# Patient Record
Sex: Female | Born: 1950 | Race: Black or African American | Hispanic: No | Smoking: Current every day smoker
Health system: Southern US, Community
[De-identification: ages and names within clinical notes are randomized; demographics above are authoritative.]

## PROBLEM LIST (undated history)

## (undated) DIAGNOSIS — F329 Major depressive disorder, single episode, unspecified: Secondary | ICD-10-CM

## (undated) DIAGNOSIS — E785 Hyperlipidemia, unspecified: Secondary | ICD-10-CM

## (undated) DIAGNOSIS — T7840XA Allergy, unspecified, initial encounter: Secondary | ICD-10-CM

## (undated) DIAGNOSIS — C50919 Malignant neoplasm of unspecified site of unspecified female breast: Secondary | ICD-10-CM

## (undated) DIAGNOSIS — F32A Depression, unspecified: Secondary | ICD-10-CM

## (undated) DIAGNOSIS — H269 Unspecified cataract: Secondary | ICD-10-CM

## (undated) DIAGNOSIS — F419 Anxiety disorder, unspecified: Secondary | ICD-10-CM

## (undated) DIAGNOSIS — I10 Essential (primary) hypertension: Secondary | ICD-10-CM

## (undated) HISTORY — DX: Unspecified cataract: H26.9

## (undated) HISTORY — DX: Major depressive disorder, single episode, unspecified: F32.9

## (undated) HISTORY — PX: OTHER SURGICAL HISTORY: SHX169

## (undated) HISTORY — DX: Anxiety disorder, unspecified: F41.9

## (undated) HISTORY — DX: Hyperlipidemia, unspecified: E78.5

## (undated) HISTORY — DX: Depression, unspecified: F32.A

## (undated) HISTORY — DX: Essential (primary) hypertension: I10

## (undated) HISTORY — DX: Allergy, unspecified, initial encounter: T78.40XA

## (undated) HISTORY — PX: DENTAL SURGERY: SHX609

## (undated) HISTORY — DX: Malignant neoplasm of unspecified site of unspecified female breast: C50.919

---

## 1974-01-15 HISTORY — PX: TUBAL LIGATION: SHX77

## 2002-06-28 ENCOUNTER — Emergency Department (HOSPITAL_COMMUNITY): Admission: EM | Admit: 2002-06-28 | Discharge: 2002-06-28 | Payer: Self-pay

## 2002-10-05 ENCOUNTER — Emergency Department (HOSPITAL_COMMUNITY): Admission: EM | Admit: 2002-10-05 | Discharge: 2002-10-06 | Payer: Self-pay | Admitting: Emergency Medicine

## 2002-10-05 ENCOUNTER — Encounter: Payer: Self-pay | Admitting: Emergency Medicine

## 2003-09-29 ENCOUNTER — Emergency Department (HOSPITAL_COMMUNITY): Admission: EM | Admit: 2003-09-29 | Discharge: 2003-09-29 | Payer: Self-pay | Admitting: Family Medicine

## 2016-10-18 ENCOUNTER — Encounter: Payer: Self-pay | Admitting: Internal Medicine

## 2016-12-12 ENCOUNTER — Ambulatory Visit (AMBULATORY_SURGERY_CENTER): Payer: Self-pay | Admitting: *Deleted

## 2016-12-12 ENCOUNTER — Other Ambulatory Visit: Payer: Self-pay

## 2016-12-12 VITALS — Ht 69.0 in | Wt 189.0 lb

## 2016-12-12 DIAGNOSIS — Z800 Family history of malignant neoplasm of digestive organs: Secondary | ICD-10-CM

## 2016-12-12 MED ORDER — NA SULFATE-K SULFATE-MG SULF 17.5-3.13-1.6 GM/177ML PO SOLN: 1.0000 | Freq: Once | ORAL | 0 refills | Status: AC

## 2016-12-12 NOTE — Progress Notes (Signed)
No egg or soy allergy known to patient  No issues with past sedation with any surgeries  or procedures, no intubation problems  No diet pills per patient No home 02 use per patient  No blood thinners per patient  Pt denies issues with constipation  No A fib or A flutter  EMMI video sent to pt's e mail pt declined   

## 2016-12-24 ENCOUNTER — Encounter: Payer: Self-pay | Admitting: Internal Medicine

## 2016-12-31 ENCOUNTER — Encounter: Payer: Self-pay | Admitting: Internal Medicine

## 2016-12-31 ENCOUNTER — Other Ambulatory Visit: Payer: Self-pay

## 2016-12-31 ENCOUNTER — Ambulatory Visit (AMBULATORY_SURGERY_CENTER): Payer: Medicare Other | Admitting: Internal Medicine

## 2016-12-31 VITALS — BP 145/70 | HR 66 | Temp 98.2°F | Resp 12 | Ht 69.0 in | Wt 189.0 lb

## 2016-12-31 DIAGNOSIS — Z1211 Encounter for screening for malignant neoplasm of colon: Secondary | ICD-10-CM | POA: Diagnosis not present

## 2016-12-31 DIAGNOSIS — D128 Benign neoplasm of rectum: Secondary | ICD-10-CM

## 2016-12-31 DIAGNOSIS — D122 Benign neoplasm of ascending colon: Secondary | ICD-10-CM

## 2016-12-31 DIAGNOSIS — D124 Benign neoplasm of descending colon: Secondary | ICD-10-CM

## 2016-12-31 DIAGNOSIS — Z800 Family history of malignant neoplasm of digestive organs: Secondary | ICD-10-CM

## 2016-12-31 DIAGNOSIS — D129 Benign neoplasm of anus and anal canal: Secondary | ICD-10-CM

## 2016-12-31 DIAGNOSIS — D127 Benign neoplasm of rectosigmoid junction: Secondary | ICD-10-CM

## 2016-12-31 MED ORDER — SODIUM CHLORIDE 0.9 % IV SOLN: 500.0000 mL | Freq: Once | INTRAVENOUS | Status: DC

## 2016-12-31 NOTE — Progress Notes (Signed)
Pt's states no medical or surgical changes since previsit or office visit. 

## 2016-12-31 NOTE — Patient Instructions (Signed)
  No Ibuprofen,Na[proxen,or other non steroidal anti inflammatory products for 2 weeks after polyp remval  Await pathology results from Dr Hilarie Fredrickson   Information on polyps and hemorrhoids given to you today   YOU HAD AN ENDOSCOPIC PROCEDURE TODAY AT [facility]:   Refer to the procedure report that was given to you for any specific questions about what was found during the examination.  If the procedure report does not answer your questions, please call your gastroenterologist to clarify.  If you requested that your care partner not be given the details of your procedure findings, then the procedure report has been included in a sealed envelope for you to review at your convenience later.  YOU SHOULD EXPECT: Some feelings of bloating in the abdomen. Passage of more gas than usual.  Walking can help get rid of the air that was put into your GI tract during the procedure and reduce the bloating. If you had a lower endoscopy (such as a colonoscopy or flexible sigmoidoscopy) you may notice spotting of blood in your stool or on the toilet paper. If you underwent a bowel prep for your procedure, you may not have a normal bowel movement for a few days.  Please Note:  You might notice some irritation and congestion in your nose or some drainage.  This is from the oxygen used during your procedure.  There is no need for concern and it should clear up in a day or so.  SYMPTOMS TO REPORT IMMEDIATELY:   Following lower endoscopy (colonoscopy or flexible sigmoidoscopy):  Excessive amounts of blood in the stool  Significant tenderness or worsening of abdominal pains  Swelling of the abdomen that is new, acute  Fever of 100F or higher    For urgent or emergent issues, a gastroenterologist can be reached at any hour by calling [phone].   DIET:  We do recommend a small meal at first, but then you may proceed to your regular diet.  Drink plenty of fluids but you should avoid  alcoholic beverages for 24 hours.  ACTIVITY:  You should plan to take it easy for the rest of today and you should NOT DRIVE or use heavy machinery until tomorrow (because of the sedation medicines used during the test).    FOLLOW UP: Our staff will call the number listed on your records the next business day following your procedure to check on you and address any questions or concerns that you may have regarding the information given to you following your procedure. If we do not reach you, we will leave a message.  However, if you are feeling well and you are not experiencing any problems, there is no need to return our call.  We will assume that you have returned to your regular daily activities without incident.  If any biopsies were taken you will be contacted by phone or by letter within the next 1-3 weeks.  Please call us at [phone] if you have not heard about the biopsies in 3 weeks.    SIGNATURES/CONFIDENTIALITY: You and/or your care partner have signed paperwork which will be entered into your electronic medical record.  These signatures attest to the fact that that the information above on your After Visit Summary has been reviewed and is understood.  Full responsibility of the confidentiality of this discharge information lies with you and/or your care-partner.

## 2016-12-31 NOTE — Progress Notes (Signed)
Called to room to assist during endoscopic procedure.  Patient ID and intended procedure confirmed with present staff. Received instructions for my participation in the procedure from the performing physician.  

## 2016-12-31 NOTE — Progress Notes (Signed)
Report to PACU, RN, vss, BBS= Clear.  

## 2016-12-31 NOTE — Op Note (Signed)
[facility] Patient Name: Donna [name] Procedure Date: 12/31/2016 8:42 AM MRN: [id number] Endoscopist: Jerene Bears , MD Age: 66 Referring MD:  Date of Birth: 06-04-1950 Gender: Female Account #: [id number] Procedure:                Colonoscopy Indications:              Screening in patient at increased risk: Family                            history of 1st-degree relative with colorectal                            cancer, This is the patient's first colonoscopy Medicines:                Monitored Anesthesia Care Procedure:                Pre-Anesthesia Assessment:                           - Prior to the procedure, a History and Physical                            was performed, and patient medications and                            allergies were reviewed. The patient's tolerance of                            previous anesthesia was also reviewed. The risks                            and benefits of the procedure and the sedation                            options and risks were discussed with the patient.                            All questions were answered, and informed consent                            was obtained. Prior Anticoagulants: The patient has                            taken no previous anticoagulant or antiplatelet                            agents. ASA Grade Assessment: II - A patient with                            mild systemic disease. After reviewing the risks                            and benefits, the patient was deemed in  satisfactory condition to undergo the procedure.                           After obtaining informed consent, the colonoscope                            was passed under direct vision. Throughout the                            procedure, the patient's blood pressure, pulse, and                            oxygen saturations were monitored continuously. The                            Model PCF-H190DL  [id number]) scope was introduced                            through the anus and advanced to the the cecum,                            identified by appendiceal orifice and ileocecal                            valve. The colonoscopy was performed without                            difficulty. The patient tolerated the procedure                            well. The quality of the bowel preparation was                            good. The ileocecal valve, appendiceal orifice, and                            rectum were photographed. Scope In: 8:55:39 AM Scope Out: 9:21:31 AM Scope Withdrawal Time: 0 hours 19 minutes 18 seconds  Total Procedure Duration: 0 hours 25 minutes 52 seconds  Findings:                 The digital rectal exam was normal.                           A 6 mm polyp was found in the ascending colon. The                            polyp was sessile. The polyp was removed with a                            cold snare. Resection and retrieval were complete.                           A 10 mm polyp was found in the proximal  descending                            colon. The polyp was flat. The polyp was removed                            with a hot snare. Resection and retrieval were                            complete.                           Two sessile polyps were found in the descending                            colon. The polyps were 3 to 4 mm in size. These                            polyps were removed with a cold snare. Resection                            and retrieval were complete.                           A 6 mm polyp was found in the recto-sigmoid colon.                            The polyp was sessile. The polyp was removed with a                            cold snare. Resection and retrieval were complete.                           A 4 mm polyp was found in the rectum. The polyp was                            sessile. The polyp was removed with a cold snare.                             Resection and retrieval were complete.                           Internal hemorrhoids were found during                            retroflexion. The hemorrhoids were small. Complications:            No immediate complications. Estimated Blood Loss:     Estimated blood loss was minimal. Impression:               - One 6 mm polyp in the ascending colon, removed                            with a cold snare. Resected and  retrieved.                           - One 10 mm polyp in the proximal descending colon,                            removed with a hot snare. Resected and retrieved.                           - Two 3 to 4 mm polyps in the descending colon,                            removed with a cold snare. Resected and retrieved.                           - One 6 mm polyp at the recto-sigmoid colon,                            removed with a cold snare. Resected and retrieved.                           - One 4 mm polyp in the rectum, removed with a cold                            snare. Resected and retrieved.                           - Internal hemorrhoids. Recommendation:           - Patient has a contact number available for                            emergencies. The signs and symptoms of potential                            delayed complications were discussed with the                            patient. Return to normal activities tomorrow.                            Written discharge instructions were provided to the                            patient.                           - Resume previous diet.                           - Continue present medications.                           - Await pathology results.                           -  Repeat colonoscopy is recommended for                            surveillance. The colonoscopy date will be                            determined after pathology results from today's                            exam become  available for review.                           - No ibuprofen, naproxen, or other non-steroidal                            anti-inflammatory drugs for 2 weeks after polyp                            removal. Jerene Bears, MD 12/31/2016 9:27:49 AM This report has been signed electronically.

## 2017-01-01 ENCOUNTER — Telehealth: Payer: Self-pay

## 2017-01-01 NOTE — Telephone Encounter (Signed)
Called [phone] and left a messaged we tried to reach pt for a follow up call. maw

## 2017-01-01 NOTE — Telephone Encounter (Signed)
  Follow up Call-  Call back number 12/31/2016  Post procedure Call Back phone  # [phone]  Permission to leave phone message Yes  Some recent data might be hidden     Patient questions:  Do you have a fever, pain , or abdominal swelling? No. Pain Score  0 *  Have you tolerated food without any problems? Yes.    Have you been able to return to your normal activities? Yes.    Do you have any questions about your discharge instructions: Diet   No. Medications  No. Follow up visit  No.  Do you have questions or concerns about your Care? No.  Actions: * If pain score is 4 or above: No action needed, pain <4.  No problems noted per pt. maw

## 2017-01-04 ENCOUNTER — Encounter: Payer: Self-pay | Admitting: Internal Medicine

## 2019-02-11 ENCOUNTER — Ambulatory Visit: Payer: Medicare Other | Attending: Internal Medicine

## 2019-02-11 DIAGNOSIS — Z20822 Contact with and (suspected) exposure to covid-19: Secondary | ICD-10-CM

## 2019-02-12 LAB — NOVEL CORONAVIRUS, NAA: SARS-CoV-2, NAA: NOT DETECTED

## 2019-02-13 ENCOUNTER — Telehealth: Payer: Self-pay

## 2019-02-13 NOTE — Telephone Encounter (Signed)
Patient is calling to receive her COVID test results. Patient expressed understanding. And will call back with a fax number of her employer for her results to be faxed.

## 2020-03-29 ENCOUNTER — Encounter: Payer: Self-pay | Admitting: Internal Medicine

## 2020-05-15 DIAGNOSIS — I639 Cerebral infarction, unspecified: Secondary | ICD-10-CM

## 2020-05-15 HISTORY — DX: Cerebral infarction, unspecified: I63.9

## 2020-05-25 ENCOUNTER — Other Ambulatory Visit: Payer: Self-pay

## 2020-05-25 ENCOUNTER — Emergency Department (HOSPITAL_COMMUNITY): Payer: Medicare Other

## 2020-05-25 ENCOUNTER — Inpatient Hospital Stay (HOSPITAL_COMMUNITY)
Admission: EM | Admit: 2020-05-25 | Discharge: 2020-05-31 | DRG: 065 | Disposition: A | Discharge status: Skilled Nursing Facility | Payer: Medicare Other | Attending: Family Medicine | Admitting: Family Medicine

## 2020-05-25 ENCOUNTER — Encounter (HOSPITAL_COMMUNITY): Payer: Self-pay

## 2020-05-25 DIAGNOSIS — F17210 Nicotine dependence, cigarettes, uncomplicated: Secondary | ICD-10-CM | POA: Diagnosis present

## 2020-05-25 DIAGNOSIS — I6340 Cerebral infarction due to embolism of unspecified cerebral artery: Secondary | ICD-10-CM | POA: Diagnosis not present

## 2020-05-25 DIAGNOSIS — E785 Hyperlipidemia, unspecified: Secondary | ICD-10-CM | POA: Diagnosis present

## 2020-05-25 DIAGNOSIS — Z20822 Contact with and (suspected) exposure to covid-19: Secondary | ICD-10-CM | POA: Diagnosis present

## 2020-05-25 DIAGNOSIS — R471 Dysarthria and anarthria: Secondary | ICD-10-CM | POA: Diagnosis present

## 2020-05-25 DIAGNOSIS — Z66 Do not resuscitate: Secondary | ICD-10-CM | POA: Diagnosis present

## 2020-05-25 DIAGNOSIS — Z634 Disappearance and death of family member: Secondary | ICD-10-CM

## 2020-05-25 DIAGNOSIS — I639 Cerebral infarction, unspecified: Secondary | ICD-10-CM | POA: Diagnosis present

## 2020-05-25 DIAGNOSIS — R29706 NIHSS score 6: Secondary | ICD-10-CM | POA: Diagnosis present

## 2020-05-25 DIAGNOSIS — K5900 Constipation, unspecified: Secondary | ICD-10-CM | POA: Diagnosis not present

## 2020-05-25 DIAGNOSIS — I6300 Cerebral infarction due to thrombosis of unspecified precerebral artery: Secondary | ICD-10-CM | POA: Diagnosis not present

## 2020-05-25 DIAGNOSIS — Z9114 Patient's other noncompliance with medication regimen: Secondary | ICD-10-CM | POA: Diagnosis not present

## 2020-05-25 DIAGNOSIS — I69354 Hemiplegia and hemiparesis following cerebral infarction affecting left non-dominant side: Secondary | ICD-10-CM | POA: Diagnosis not present

## 2020-05-25 DIAGNOSIS — F419 Anxiety disorder, unspecified: Secondary | ICD-10-CM | POA: Diagnosis present

## 2020-05-25 DIAGNOSIS — I6389 Other cerebral infarction: Secondary | ICD-10-CM | POA: Diagnosis not present

## 2020-05-25 DIAGNOSIS — F32A Depression, unspecified: Secondary | ICD-10-CM | POA: Diagnosis present

## 2020-05-25 DIAGNOSIS — Z9851 Tubal ligation status: Secondary | ICD-10-CM | POA: Diagnosis not present

## 2020-05-25 DIAGNOSIS — Z79899 Other long term (current) drug therapy: Secondary | ICD-10-CM | POA: Diagnosis not present

## 2020-05-25 DIAGNOSIS — R29810 Facial weakness: Secondary | ICD-10-CM | POA: Diagnosis present

## 2020-05-25 DIAGNOSIS — R059 Cough, unspecified: Secondary | ICD-10-CM | POA: Diagnosis not present

## 2020-05-25 DIAGNOSIS — I10 Essential (primary) hypertension: Secondary | ICD-10-CM

## 2020-05-25 DIAGNOSIS — F5102 Adjustment insomnia: Secondary | ICD-10-CM

## 2020-05-25 LAB — PROTIME-INR
INR: 0.9 (ref 0.8–1.2)
Prothrombin Time: 12.2 seconds (ref 11.4–15.2)

## 2020-05-25 LAB — I-STAT CHEM 8, ED
BUN: 22 mg/dL (ref 8–23)
Calcium, Ion: 1.17 mmol/L (ref 1.15–1.40)
Chloride: 110 mmol/L (ref 98–111)
Creatinine, Ser: 0.9 mg/dL (ref 0.44–1.00)
Glucose, Bld: 88 mg/dL (ref 70–99)
HCT: 44 % (ref 36.0–46.0)
Hemoglobin: 15 g/dL (ref 12.0–15.0)
Potassium: 4.6 mmol/L (ref 3.5–5.1)
Sodium: 140 mmol/L (ref 135–145)
TCO2: 22 mmol/L (ref 22–32)

## 2020-05-25 LAB — CBC
HCT: 46.2 % — ABNORMAL HIGH (ref 36.0–46.0)
Hemoglobin: 15.3 g/dL — ABNORMAL HIGH (ref 12.0–15.0)
MCH: 33 pg (ref 26.0–34.0)
MCHC: 33.1 g/dL (ref 30.0–36.0)
MCV: 99.6 fL (ref 80.0–100.0)
Platelets: 261 10*3/uL (ref 150–400)
RBC: 4.64 MIL/uL (ref 3.87–5.11)
RDW: 13.1 % (ref 11.5–15.5)
WBC: 11.4 10*3/uL — ABNORMAL HIGH (ref 4.0–10.5)
nRBC: 0 % (ref 0.0–0.2)

## 2020-05-25 LAB — COMPREHENSIVE METABOLIC PANEL
ALT: 17 U/L (ref 0–44)
AST: 22 U/L (ref 15–41)
Albumin: 3.3 g/dL — ABNORMAL LOW (ref 3.5–5.0)
Alkaline Phosphatase: 63 U/L (ref 38–126)
Anion gap: 4 — ABNORMAL LOW (ref 5–15)
BUN: 20 mg/dL (ref 8–23)
CO2: 24 mmol/L (ref 22–32)
Calcium: 9.1 mg/dL (ref 8.9–10.3)
Chloride: 109 mmol/L (ref 98–111)
Creatinine, Ser: 1 mg/dL (ref 0.44–1.00)
GFR, Estimated: >60 mL/min (ref >60)
Glucose, Bld: 97 mg/dL (ref 70–99)
Potassium: 4.9 mmol/L (ref 3.5–5.1)
Sodium: 137 mmol/L (ref 135–145)
Total Bilirubin: 0.3 mg/dL (ref 0.3–1.2)
Total Protein: 6.2 g/dL — ABNORMAL LOW (ref 6.5–8.1)

## 2020-05-25 LAB — DIFFERENTIAL
Abs Immature Granulocytes: 0.04 10*3/uL (ref 0.00–0.07)
Basophils Absolute: 0.1 10*3/uL (ref 0.0–0.1)
Basophils Relative: 1 %
Eosinophils Absolute: 0.3 10*3/uL (ref 0.0–0.5)
Eosinophils Relative: 3 %
Immature Granulocytes: 0 %
Lymphocytes Relative: 34 %
Lymphs Abs: 3.8 10*3/uL (ref 0.7–4.0)
Monocytes Absolute: 0.5 10*3/uL (ref 0.1–1.0)
Monocytes Relative: 4 %
Neutro Abs: 6.7 10*3/uL (ref 1.7–7.7)
Neutrophils Relative %: 58 %

## 2020-05-25 LAB — RESP PANEL BY RT-PCR (FLU A&B, COVID) ARPGX2
Influenza A by PCR: NEGATIVE
Influenza B by PCR: NEGATIVE
SARS Coronavirus 2 by RT PCR: NEGATIVE

## 2020-05-25 LAB — CBG MONITORING, ED: Glucose-Capillary: 98 mg/dL (ref 70–99)

## 2020-05-25 LAB — APTT: aPTT: 30 seconds (ref 24–36)

## 2020-05-25 MED ORDER — NICOTINE 7 MG/24HR TD PT24
7.0000 mg | MEDICATED_PATCH | Freq: Every day | TRANSDERMAL | Status: DC | PRN
  Administered 2020-05-25 – 2020-05-31 (×3): 7 mg via TRANSDERMAL
  Filled 2020-05-25 (×5): qty 1

## 2020-05-25 MED ORDER — SODIUM CHLORIDE 0.9% FLUSH
3.0000 mL | Freq: Once | INTRAVENOUS | Status: AC
Start: 2020-05-25 — End: 2020-05-25
  Administered 2020-05-25: 3 mL via INTRAVENOUS

## 2020-05-25 MED ORDER — ASPIRIN 325 MG PO TABS: 325.0000 mg | ORAL_TABLET | Freq: Every day | ORAL | Status: DC

## 2020-05-25 MED ORDER — ENOXAPARIN SODIUM 40 MG/0.4ML IJ SOSY
40.0000 mg | PREFILLED_SYRINGE | INTRAMUSCULAR | Status: DC
  Administered 2020-05-25 – 2020-05-31 (×7): 40 mg via SUBCUTANEOUS
  Filled 2020-05-25 (×7): qty 0.4

## 2020-05-25 MED ORDER — TRAZODONE HCL 50 MG PO TABS
50.0000 mg | ORAL_TABLET | Freq: Every day | ORAL | Status: DC
  Administered 2020-05-25 – 2020-05-31 (×7): 50 mg via ORAL
  Filled 2020-05-25 (×7): qty 1

## 2020-05-25 MED ORDER — CLOPIDOGREL BISULFATE 75 MG PO TABS: 75.0000 mg | ORAL_TABLET | Freq: Every day | ORAL | Status: DC

## 2020-05-25 MED ORDER — ASPIRIN 325 MG PO TABS
325.0000 mg | ORAL_TABLET | Freq: Once | ORAL | Status: AC
  Administered 2020-05-25: 325 mg via ORAL
  Filled 2020-05-25: qty 1

## 2020-05-25 NOTE — Progress Notes (Signed)
Pt has been admitted on the unit via bed. Pt has all belongings and daughter at bedside. Telephone and Call light are within reach.  05/25/20 1811  Vitals  Temp 98.3 F (36.8 C)  Temp Source Oral  BP (!) 155/69  MAP (mmHg) 95  BP Location Left Arm  BP Method Automatic  Patient Position (if appropriate) Lying  Pulse Rate 70  Pulse Rate Source Dinamap  Resp 17  Level of Consciousness  Level of Consciousness Alert  MEWS COLOR  MEWS Score Color Green  Oxygen Therapy  SpO2 100 %  O2 Device Room Air  Pain Assessment  Pain Scale 0-10  Pain Score 0  MEWS Score  MEWS Temp 0  MEWS Systolic 0  MEWS Pulse 0  MEWS RR 0  MEWS LOC 0  MEWS Score 0

## 2020-05-25 NOTE — ED Triage Notes (Signed)
Per EMS: PT was fine and went to bed last night at 23:00. Woke up around 02:00 feeling weak on left side and not able to get to the bathroom. Family saw this morning, pt had left side facial droop, left side weakness and slurred speech.  EMS started 18g L hand.

## 2020-05-25 NOTE — Code Documentation (Signed)
Stroke Response Nurse Documentation Code Stroke Documentation Aaminah Forrester  is a 70 y.o.  y.o. female  arriving to [place] [facility] ED via [facility] on 05/25/2020 with PMH of HTN, HLD, Smoker, Anxiety, Depression. Code stroke was activated by EMS. Patient from home where she was LKW at 2300 last night and woke today with L sided weakness, per patient she was able to make it to the bathroom but was then unable to get off the toilet. Patient taking No antithrombotic PTA. Stroke team at the bedside on patient arrival, CBG 52, labs drawn and patient cleared for CT by Dr. Gilford Raid. Patient taken to CT with team. NIHSS 6, see documentation for details and code stroke times. Patient with left facial droop, left leg weakness, left decreased sensation and dysarthria  on exam. The following imaging was completed:  CT. Patient is not a candidate for tPA due to being outside of treatment window. Care/Plan: q2h neuro checks/VS for 12h, followed by q4h. Bedside handoff with ED RN Mosetta Pigeon.    Lenore Manner  Stroke Response RN [phone]

## 2020-05-25 NOTE — Plan of Care (Signed)

## 2020-05-25 NOTE — Consult Note (Signed)
Neurology Consultation  Reason for Consult: Left face droop, left-sided weakness Referring Physician: Dr. Gilford Raid  CC: Left-sided weakness- code stroke  History is obtained from: Patient, EMS  HPI: Donna [name] is a 70 y.o. female with a medical history significant for hypertension, hyperlipidemia, and tobacco use who presented to the ED as a Code Stroke for evaluation of left mouth droop and left-sided weakness. Per patient, she went to bed around 23:30 last night feeling normal and her daughter states that she saw her mother at baseline at this time. When she woke up this morning she states "I couldn't walk, I had to hold on the walls or I would lose balance" and states that her left leg felt heavy. She states that she sat on the toilet and could not get back up. [name] did not immediately call EMS because she thought that the weakness would pass but when it did not, her daughter activated EMS.   At baseline, [name] lives alone and is able to complete all of her ADLs independently. She states that she is supposed to be on medications for her hypertension but she has not been taking any medications for at least 6 months because she has a busy life. She does endorse being under an extreme amount of stress and states that she has many people that are dependent on her, she has to go to work, and that she lost her brother last week.  LKW: 05/24/20 at 23:30 tpa given?: no, outside of time window IR Thrombectomy? No, presentation not consistent with LVO Modified Rankin Scale: 0-Completely asymptomatic and back to baseline post- stroke   ROS: A complete ROS was performed and is negative except as noted in the HPI.   Past Medical History:  Diagnosis Date  . Allergy   . Anxiety   . Depression   . Hyperlipidemia    no meds   . Hypertension    Past Surgical History:  Procedure Laterality Date  . DENTAL SURGERY    . NSVD     x2  . TUBAL LIGATION  1976   Family History   Problem Relation Age of Onset  . Colon cancer Mother        dx'd in her 49's   . Colon polyps Sister   . Esophageal cancer Sister   . Lung cancer Sister   . Breast cancer Sister   . Rectal cancer Neg Hx   . Stomach cancer Neg Hx   . Pancreatic cancer Neg Hx    Social History:   reports that she has been smoking. She has never used smokeless tobacco. She reports that she does not drink alcohol and does not use drugs.  Smoker: started at age 61, smokes at least 0.5 ppd  Medications  Current Facility-Administered Medications:  .  0.9 %  sodium chloride infusion, 500 mL, Intravenous, Once, Pyrtle, Lajuan Lines, MD .  sodium chloride flush (NS) 0.9 % injection 3 mL, 3 mL, Intravenous, Once, Isla Pence, MD  Current Outpatient Medications:  .  amLODipine (NORVASC) 5 MG tablet, Take 5 mg by mouth daily., Disp: , Rfl: 0 .  busPIRone (BUSPAR) 5 MG tablet, Take 5 mg by mouth 2 (two) times daily., Disp: , Rfl: 0 .  cyclobenzaprine (FLEXERIL) 5 MG tablet, Take 5 mg by mouth 3 (three) times daily., Disp: , Rfl: 0 .  escitalopram (LEXAPRO) 10 MG tablet, , Disp: , Rfl: 0 .  hydrochlorothiazide (HYDRODIURIL) 25 MG tablet, , Disp: , Rfl: 0 .  IBU 800 MG tablet, , Disp: , Rfl: 0 .  ipratropium (ATROVENT) 0.03 % nasal spray, instill 2 sprays into each nostril twice a day, Disp: , Rfl: 0 .  lisinopril (PRINIVIL,ZESTRIL) 20 MG tablet, , Disp: , Rfl: 0 .  montelukast (SINGULAIR) 10 MG tablet, , Disp: , Rfl: 0 .  NICODERM CQ 14 MG/24HR patch, , Disp: , Rfl: 0 .  PROAIR HFA 108 (90 Base) MCG/ACT inhaler, , Disp: , Rfl: 0 .  traZODone (DESYREL) 50 MG tablet, , Disp: , Rfl: 0  Exam: Current vital signs: BP (!) 157/74   Pulse 77   Temp 97.9 F (36.6 C) (Oral)   Resp 18   SpO2 98%  Vital signs in last 24 hours:    GENERAL: Awake, alert, pleasant mildly obese African-American lady laying in EMS stretcher Psych: Patient becomes tearful, expresses anxiety. Patient is cooperative with examination.   Head: Normocephalic and atraumatic, dry mm EENT: Arcus senilis present bilaterally, no OP obstruction LUNGS: Normal respiratory effort. Non-labored breathing CV: Regular rate on telemetry, extremities warm without edema ABDOMEN: Soft, non-tender Ext: warm, well perfused, no obvious deformity  NEURO:  Mental Status: Awake, alert, and oriented to person, place, age, month, and situation. She is able to provide a clear and coherent history of present illness.  Speech is mildly dysarthric.  Naming, repetition, and comprehension are intact.  No aphasia or neglect noted on examination.  Cranial Nerves:  II: PERRL 5 mm/brisk. Visual fields full.  III, IV, VI: EOMI without ptosis V: Sensation is intact to light touch and symmetrical to face.  VII: Face is asymmetric resting and smiling with left mouth droop VIII: Hearing is intact to voice IX, X: Palate elevation is symmetric. Phonation normal.  XI: Normal sternocleidomastoid and trapezius muscle strength XII: Tongue protrudes midline without fasciculations.   Motor: 5/5 strength on right upper and lower extremities. Proximal left upper extremity 5/5 without vertical drift with weak grip strength 3/5. Left lower extremity strength is 2/5 without antigravity movement.  Tone and bulk are normal.  Sensation: Decreased sensation to light touch present in left upper and lower extremity.  Coordination: FTN intact bilaterally and HKS intact on the right, unable to assess on the left lower extremity due to weakness. DTRs: 2+ and symmetric patellae and biceps  Gait: Deferred  NIHSS: 1a Level of Conscious.: 0 1b LOC Questions: 0 1c LOC Commands: 0 2 Best Gaze: 0 3 Visual: 0 4 Facial Palsy: 1 5a Motor Arm - left: 0 5b Motor Arm - Right: 0 6a Motor Leg - Left: 3 6b Motor Leg - Right: 0 7 Limb Ataxia: 0 8 Sensory: 1 9 Best Language: 0 10 Dysarthria: 1 11 Extinct. and Inatten.: 0 TOTAL: 6 Premorbid modified Rankin scale 0 Labs I have  reviewed labs in epic and the results pertinent to this consultation are: CBC    Component Value Date/Time   WBC 11.4 (H) 05/25/2020 1340   RBC 4.64 05/25/2020 1340   HGB 15.3 (H) 05/25/2020 1340   HCT 46.2 (H) 05/25/2020 1340   PLT 261 05/25/2020 1340   MCV 99.6 05/25/2020 1340   MCH 33.0 05/25/2020 1340   MCHC 33.1 05/25/2020 1340   RDW 13.1 05/25/2020 1340   LYMPHSABS 3.8 05/25/2020 1340   MONOABS 0.5 05/25/2020 1340   EOSABS 0.3 05/25/2020 1340   BASOSABS 0.1 05/25/2020 1340   CMP     Component Value Date/Time   NA 140 05/25/2020 1349   K 4.6 05/25/2020 1349  CL 110 05/25/2020 1349   CO2 24 05/25/2020 1340   GLUCOSE 88 05/25/2020 1349   BUN 22 05/25/2020 1349   CREATININE 0.90 05/25/2020 1349   CALCIUM 9.1 05/25/2020 1340   PROT 6.2 (L) 05/25/2020 1340   ALBUMIN 3.3 (L) 05/25/2020 1340   AST 22 05/25/2020 1340   ALT 17 05/25/2020 1340   ALKPHOS 63 05/25/2020 1340   BILITOT 0.3 05/25/2020 1340   GFRNONAA >60 05/25/2020 1340    Lipid Panel  No results found for: CHOL, TRIG, HDL, CHOLHDL, VLDL, LDLCALC, LDLDIRECT No results found for: HGBA1C  Imaging I have reviewed the images obtained: CT Head CODE STROKE: There is no acute intracranial hemorrhage or evidence of acute infarction. ASPECT score is 10. 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 MRI examination of the brain pending  Assessment: 70 year old female with multiple stroke risk factors: smoking, age, hypertension- not taking antihypertensive medications > 6 months, and hyperlipidemia who presents to the ED for evaluation of left mouth droop, left lower extremity weakness, and dysarthria. - Examination reveals NIHSS of 6 with dysarthria, left mouth droop, LLE weakness,  distal LUE weakness, and decreased sensation of the left upper and lower extremity.  Exam suggests right brain subcortical infarct likely from small vessel disease - Initial CT head without acute infarction but with  age-indeterminate small vessel infarct of right corona radiata. MRI brain / MRA head and neck pending for further evaluation.  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 Further stroke work up pending  Impression: Concern for acute ischemic right brain subcortical stroke-patient has presented outside tPA window and does not have clinical exam consistent with large vessel occlusion History of hypertension- not on antihypertensive medications Remote and current smoking history  Recommendations: - Admission to medical team for stroke work up - HgbA1c, fasting lipid panel- goal LDL < 70; if LDL > 70 initiate statin therapy  - MRI brain, MRA head and neck without contrast - Frequent neuro checks - Echocardiogram - Prophylactic therapy- Antiplatelet med: Aspirin - dose 325mg  PO or 300mg  PR, consider DAPT following MRI - Permissive hypertension- treat blood pressure > 220 / > 180 - Risk factor modification - Telemetry monitoring - PT consult, OT consult, Speech consult - Stroke team to follow  Anibal Henderson, AGAC-NP [facility] Pager: [phone]  Stroke Attending Note:  I have personally obtained history,examined this patient, reviewed notes, independently viewed imaging studies, participated in medical decision making and plan of care.ROS completed by me personally and pertinent positives fully documented  I have made any additions or clarifications directly to the above note. Agree with note above.  Patient presented with sudden onset of left leg weakness slurred speech and left facial droop likely from right brain subcortical infarct and has presented outside time window for tPA and clinical presentation not consistent with an LVO.  Recommend admission to the medical team for further stroke work-up.  Check MRI, MRAs, echocardiogram, lipid profile hemoglobin A1c.  She will likely need dual antiplatelet therapy  of aspirin and Plavix for 3 weeks followed by aspirin alone and aggressive risk factor modification.  Discussed with patient and with Dr. Gilford Raid.  Greater than 50% time during this 80-minute consultation visit was spent in counseling and coordination of care and discussion with care team and answering questions  Antony Contras, MD Medical Director [place] Pager: [phone] 05/25/2020 3:57 PM

## 2020-05-25 NOTE — ED Notes (Signed)
Patient transported to MRI 

## 2020-05-25 NOTE — H&P (Addendum)
[facility] Admission History and Physical Service Pager: [phone]  Patient name: Donna [name] Medical record number: [id number] Date of birth: 05/16/1950 Age: 70 y.o. Gender: female  Primary Care Provider: Care, Jinny Blossom [facility] Consultants: Neurology, stroke team  Code Status: DNR Preferred Emergency Contact: [name]  Chief Complaint: Left lower extremity weakness   Assessment and Plan: Donna [name] is a 70 y.o. female presenting with L sided  . PMH is significant for hypertension, hyperlipidemia, depression session and anxiety, tobacco use.  Acute Ischemic Infarct  Patient presented with L sided lower extremity weakness this morning.  Last known normal 5/10 at 23:30. On presentation she is alert and oriented x4. BP elevated at 169/80. On physical exam She has left sided facial droop, left upper and lower extremity weakness, and decreased sensation of her distal left upper extremity and her left lower extremity. CT head without acute intracranial hemorrhage or evidence of acute infarction but with age det. MRI brain and neck angio showing acute small infarcts along the roof of the right temporal horn and head of the right hippocampus. Acute or subacute punctate cortical right occipital and left temporal infarcts. Possible punctate subacute infarct of posterior limb of right internal capsule. Chronic infarct right corona radiata. 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 Intracranially, suspect retrograde flow to the PICA origin. Patient was not given tPA because outside of the window and clinical exam more consistent with small vessel disease without large vessel occlusion. Neurology consulted in the ED. Admitting patient for further stroke work up.  - admit to [facility], attending Dr. Andria Frames  - Neurology following, appreciate recommendations - frequent neuro checks - vitals per floor routine   - f/u echo - f/u carotid US - aspirin 325mg  po - per neuro, will likely need DAPT of aspirin and plavix for 3 weeks followed by aspirin alone and aggressive risk factor modification  - permissive HTN - risk stratification labs: a1c, lipid panel - If LDL > 70 start statin  - SLP eval  - PT/ OT consult   HTN BP on admission 169/80.  Have ranged from 155/69-169/80 since admission.  Not taking any medication, was previously on amlodipine-Benazepril 10-20 mg daily, but states it has been a few months since she took it because she felt fine.  - permissive HTN- treat BP >220/>180 - Plan to normalize blood pressures in a few days  Anxiety and depression Reports increased depression since her brother passed away about a week ago.  Reports only taking Cymbalta 60 mg daily - continue home Cymbalta after swallow eval   Tobacco Use  Reports smoking a 1/2 PPD  FEN/GI: NPO Prophylaxis: SCDs  Disposition: Med-tele   History of Present Illness:  Donna [name] is a 70 y.o. female presenting with left lower extremity weakness and slurred speech   Last normal at 11pm. At 2 am says she couldn't get up. Says in the morning she was trying to go to the kitchen and felt weak. Says noticed leg weakness and felt like she needed a cane. Did not notice any slurred speech, says she lived by herself. States she called her daughter around 1pm this afternoon  and told her she couldn't walk and daughter suggested she call 911. Daughter is bedside and reports slurred speech when she was talking to her and that she was talking slow. Denies any history of this happening before.   Drank 1 beer yesterday when came home. Once she woke up  she felt like she was going to fall and needed a cane Says she hasnt felt good in a couple weeks. States she is a very strong person and has tried to fulfill her obligations to her family and friends   Patient lives alone and is able to do her ADLs   Endorses front of her head  hurting, but thought it was maybe a sinus headache, denies change of vision. States she has not been takign medication for HTN due to being busy but is under a lot of stress, and lost her brother last week so she has had a lot going on. She thought her weakness was due to the stress shes been under.   States the only medication she is currently taking is cymbalta because its the only one she had a refill on   Endorses about 1 or 2 beers. Denies recreational drug use  Current smoker about a half a pack a day since 23   Review Of Systems: Per HPI with the following additions:   Review of Systems  Constitutional: Negative for fever.  Eyes: Negative for visual disturbance.  Respiratory: Negative for shortness of breath.   Genitourinary: Negative for difficulty urinating and dysuria.  Neurological: Positive for facial asymmetry, speech difficulty, weakness and headaches.  Psychiatric/Behavioral: Negative for confusion.     There are no problems to display for this patient.   Past Medical History: Past Medical History:  Diagnosis Date  . Allergy   . Anxiety   . Depression   . Hyperlipidemia    no meds   . Hypertension     Past Surgical History: Past Surgical History:  Procedure Laterality Date  . DENTAL SURGERY    . NSVD     x2  . TUBAL LIGATION  1976    Social History: Social History   Tobacco Use  . Smoking status: Current Every Day Smoker  . Smokeless tobacco: Never Used  . Tobacco comment: 8 cigs a day   Substance Use Topics  . Alcohol use: No  . Drug use: No    Family History: Family History  Problem Relation Age of Onset  . Colon cancer Mother        dx'd in her 32's   . Colon polyps Sister   . Esophageal cancer Sister   . Lung cancer Sister   . Breast cancer Sister   . Rectal cancer Neg Hx   . Stomach cancer Neg Hx   . Pancreatic cancer Neg Hx     Allergies and Medications: No Known Allergies Current Facility-Administered Medications on File  Prior to Encounter  Medication Dose Route Frequency Provider Last Rate Last Admin  . 0.9 %  sodium chloride infusion  500 mL Intravenous Once Pyrtle, Lajuan Lines, MD       Current Outpatient Medications on File Prior to Encounter  Medication Sig Dispense Refill  . busPIRone (BUSPAR) 5 MG tablet Take 5 mg by mouth 2 (two) times daily.  0  . fluticasone (FLONASE) 50 MCG/ACT nasal spray Place 1 spray into both nostrils daily.    . hydrochlorothiazide (HYDRODIURIL) 25 MG tablet Take 25 mg by mouth daily.  0  . IBU 800 MG tablet Take 800 mg by mouth every 6 (six) hours as needed for mild pain.  0  . ipratropium (ATROVENT) 0.03 % nasal spray Place 2 sprays into both nostrils 2 (two) times daily.  0  . lisinopril (PRINIVIL,ZESTRIL) 20 MG tablet Take 20 mg by mouth daily.  0  .  montelukast (SINGULAIR) 10 MG tablet Take 10 mg by mouth at bedtime.  0  . NICODERM CQ 14 MG/24HR patch Place 14 mg onto the skin daily.  0  . PROAIR HFA 108 (90 Base) MCG/ACT inhaler Inhale 1-2 puffs into the lungs every 4 (four) hours as needed for wheezing or shortness of breath.  0  . traZODone (DESYREL) 50 MG tablet Take 50 mg by mouth at bedtime.  0    Objective: BP (!) 157/74   Pulse 77   Temp 97.9 F (36.6 C) (Oral)   Resp 18   SpO2 98%  Exam: General: alert, pleasant, tearful, NAD Eyes: EOMI. PERRLA. Erythematous conjunctiva  ENTM: MMM Neck: Supple. Normal ROM Cardiovascular: RRR no murmurs Respiratory: CTAB normal WOB Gastrointestinal: soft, non-distended, non-tender MSK: see below in neuro exam  Derm: warm, dry. No visible rashes or lesions  Neuro: alert and oriented x4 II: PERRL No visual deficits III, IV, VI: EOMI  V: Sensation symmetrical and intact VII: Smiling asymmetric with left mouth droop VIII: Hearing intact  IX, X: Symmetric palate elevation  XI: Normal sternocleidomastoid and trapezius muscle strength XII: Tongue protrudes midline without fasciculations  Patient with 5/5 muscle strength of  R upper and lower extremities. L upper extremity 5/5 with reduced grip strength. LLE  Strength 2/5 Psych: mood depressed, patient tearful throughout exam. Speech and affect normal  Labs and Imaging: CBC BMET  Recent Labs  Lab 05/25/20 1340 05/25/20 1349  WBC 11.4*  --   HGB 15.3* 15.0  HCT 46.2* 44.0  PLT 261  --    Recent Labs  Lab 05/25/20 1340 05/25/20 1349  NA 137 140  K 4.9 4.6  CL 109 110  CO2 24  --   BUN 20 22  CREATININE 1.00 0.90  GLUCOSE 97 88  CALCIUM 9.1  --      EKG: NSR  MR ANGIO HEAD WO CONTRAST  Result Date: 05/25/2020 CLINICAL DATA:  Left facial droop and left-sided weakness EXAM: MRI HEAD WITHOUT CONTRAST MRA HEAD WITHOUT CONTRAST MRA NECK WITHOUT CONTRAST TECHNIQUE: Multiplanar, multiecho pulse sequences of the brain and surrounding structures were obtained without intravenous contrast. Angiographic images of the Circle of Willis were obtained using MRA technique without intravenous contrast. Angiographic images of the neck were obtained using MRA technique without intravenous contrast. Carotid stenosis measurements (when applicable) are obtained utilizing NASCET criteria, using the distal internal carotid diameter as the denominator. COMPARISON:  None. FINDINGS: MRI HEAD FINDINGS Motion artifact is present. Brain: Diffusion hyperintensity with ADC isointensity is present in the region of the posterior limb of the right internal capsule on the axial sequence. However, this is not clearly present on the coronal sequence. There is reduced diffusion in both planes along the roof the right temporal horn and along the medial temporal lobe involving the hippocampus. Punctate cortical foci of diffusion hyperintensity are present in the right occipital lobe and left posterior temporal lobe. Chronic infarct of the right corona radiata. Focus of susceptibility in the left temporal white matter is most compatible with chronic microhemorrhage. Ventricles and sulci are within  normal limits in size and configuration. There is no intracranial mass, mass effect, hydrocephalus, or extra-axial collection. Vascular: Major vessel flow voids at the skull base are preserved. Skull and upper cervical spine: Marrow signal is within normal limits. Sinuses/Orbits: Minor paranasal sinus mucosal thickening. Orbits are unremarkable. Other: Sella is unremarkable.  Mastoid air cells are aerated. MRA HEAD FINDINGS Intracranial internal carotid arteries are patent. Middle and  anterior cerebral arteries are patent. Included intracranial vertebral arteries, basilar artery, posterior cerebral arteries are patent. Given above findings on MRA neck, the visualized patent intracranial right vertebral artery may reflect retrograde flow to the PICA origin. Right posterior communicating artery is present. Possible left posterior communicating artery is well. There is no significant stenosis or aneurysm. MRA NECK FINDINGS Motion artifact is present. Common, internal, and external carotid arteries are patent. No hemodynamically significant stenosis is identified. Extracranial left vertebral artery is patent without stenosis. There is no definite flow related enhancement within the extracranial right vertebral artery. IMPRESSION: Degraded by motion artifact. Acute small infarcts along the roof of the right temporal horn and head of the right hippocampus. Acute or subacute punctate cortical right occipital and left temporal infarcts. Possible punctate subacute infarct of posterior limb of right internal capsule. Chronic infarct right corona radiata. 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 Intracranially, suspect retrograde flow to the PICA origin. Otherwise patent anterior and posterior circulations without stenosis. Electronically Signed   By: Macy Mis M.D.   On: 05/25/2020 16:26   MR ANGIO NECK WO CONTRAST  Result Date: 05/25/2020 CLINICAL DATA:  Left facial droop and  left-sided weakness EXAM: MRI HEAD WITHOUT CONTRAST MRA HEAD WITHOUT CONTRAST MRA NECK WITHOUT CONTRAST TECHNIQUE: Multiplanar, multiecho pulse sequences of the brain and surrounding structures were obtained without intravenous contrast. Angiographic images of the Circle of Willis were obtained using MRA technique without intravenous contrast. Angiographic images of the neck were obtained using MRA technique without intravenous contrast. Carotid stenosis measurements (when applicable) are obtained utilizing NASCET criteria, using the distal internal carotid diameter as the denominator. COMPARISON:  None. FINDINGS: MRI HEAD FINDINGS Motion artifact is present. Brain: Diffusion hyperintensity with ADC isointensity is present in the region of the posterior limb of the right internal capsule on the axial sequence. However, this is not clearly present on the coronal sequence. There is reduced diffusion in both planes along the roof the right temporal horn and along the medial temporal lobe involving the hippocampus. Punctate cortical foci of diffusion hyperintensity are present in the right occipital lobe and left posterior temporal lobe. Chronic infarct of the right corona radiata. Focus of susceptibility in the left temporal white matter is most compatible with chronic microhemorrhage. Ventricles and sulci are within normal limits in size and configuration. There is no intracranial mass, mass effect, hydrocephalus, or extra-axial collection. Vascular: Major vessel flow voids at the skull base are preserved. Skull and upper cervical spine: Marrow signal is within normal limits. Sinuses/Orbits: Minor paranasal sinus mucosal thickening. Orbits are unremarkable. Other: Sella is unremarkable.  Mastoid air cells are aerated. MRA HEAD FINDINGS Intracranial internal carotid arteries are patent. Middle and anterior cerebral arteries are patent. Included intracranial vertebral arteries, basilar artery, posterior cerebral  arteries are patent. Given above findings on MRA neck, the visualized patent intracranial right vertebral artery may reflect retrograde flow to the PICA origin. Right posterior communicating artery is present. Possible left posterior communicating artery is well. There is no significant stenosis or aneurysm. MRA NECK FINDINGS Motion artifact is present. Common, internal, and external carotid arteries are patent. No hemodynamically significant stenosis is identified. Extracranial left vertebral artery is patent without stenosis. There is no definite flow related enhancement within the extracranial right vertebral artery. IMPRESSION: Degraded by motion artifact. Acute small infarcts along the roof of the right temporal horn and head of the right hippocampus. Acute or subacute punctate cortical right occipital and left temporal infarcts.  Possible punctate subacute infarct of posterior limb of right internal capsule. Chronic infarct right corona radiata. 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 Intracranially, suspect retrograde flow to the PICA origin. Otherwise patent anterior and posterior circulations without stenosis. Electronically Signed   By: Macy Mis M.D.   On: 05/25/2020 16:26   MR BRAIN WO CONTRAST  Result Date: 05/25/2020 CLINICAL DATA:  Left facial droop and left-sided weakness EXAM: MRI HEAD WITHOUT CONTRAST MRA HEAD WITHOUT CONTRAST MRA NECK WITHOUT CONTRAST TECHNIQUE: Multiplanar, multiecho pulse sequences of the brain and surrounding structures were obtained without intravenous contrast. Angiographic images of the Circle of Willis were obtained using MRA technique without intravenous contrast. Angiographic images of the neck were obtained using MRA technique without intravenous contrast. Carotid stenosis measurements (when applicable) are obtained utilizing NASCET criteria, using the distal internal carotid diameter as the denominator. COMPARISON:  None.  FINDINGS: MRI HEAD FINDINGS Motion artifact is present. Brain: Diffusion hyperintensity with ADC isointensity is present in the region of the posterior limb of the right internal capsule on the axial sequence. However, this is not clearly present on the coronal sequence. There is reduced diffusion in both planes along the roof the right temporal horn and along the medial temporal lobe involving the hippocampus. Punctate cortical foci of diffusion hyperintensity are present in the right occipital lobe and left posterior temporal lobe. Chronic infarct of the right corona radiata. Focus of susceptibility in the left temporal white matter is most compatible with chronic microhemorrhage. Ventricles and sulci are within normal limits in size and configuration. There is no intracranial mass, mass effect, hydrocephalus, or extra-axial collection. Vascular: Major vessel flow voids at the skull base are preserved. Skull and upper cervical spine: Marrow signal is within normal limits. Sinuses/Orbits: Minor paranasal sinus mucosal thickening. Orbits are unremarkable. Other: Sella is unremarkable.  Mastoid air cells are aerated. MRA HEAD FINDINGS Intracranial internal carotid arteries are patent. Middle and anterior cerebral arteries are patent. Included intracranial vertebral arteries, basilar artery, posterior cerebral arteries are patent. Given above findings on MRA neck, the visualized patent intracranial right vertebral artery may reflect retrograde flow to the PICA origin. Right posterior communicating artery is present. Possible left posterior communicating artery is well. There is no significant stenosis or aneurysm. MRA NECK FINDINGS Motion artifact is present. Common, internal, and external carotid arteries are patent. No hemodynamically significant stenosis is identified. Extracranial left vertebral artery is patent without stenosis. There is no definite flow related enhancement within the extracranial right vertebral  artery. IMPRESSION: Degraded by motion artifact. Acute small infarcts along the roof of the right temporal horn and head of the right hippocampus. Acute or subacute punctate cortical right occipital and left temporal infarcts. Possible punctate subacute infarct of posterior limb of right internal capsule. Chronic infarct right corona radiata. 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 Intracranially, suspect retrograde flow to the PICA origin. Otherwise patent anterior and posterior circulations without stenosis. Electronically Signed   By: Macy Mis M.D.   On: 05/25/2020 16:26   CT HEAD CODE STROKE WO CONTRAST  Result Date: 05/25/2020 CLINICAL DATA:  Code stroke. EXAM: CT HEAD WITHOUT CONTRAST TECHNIQUE: Contiguous axial images were obtained from the base of the skull through the vertex without intravenous contrast. COMPARISON:  None. FINDINGS: Brain: There is no acute intracranial hemorrhage, mass effect, or edema. Gray-white differentiation is preserved. 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 Ventricles and sulci are normal in size and configuration.  No extra-axial collection. Vascular: No hyperdense vessel. There is intracranial atherosclerotic calcification at the skull base. Skull: Unremarkable. Sinuses/Orbits: No acute abnormality. Other: Mastoid air cells are clear. ASPECTS ([place] Stroke Program Early CT Score) - Ganglionic level infarction (caudate, lentiform nuclei, internal capsule, insula, M1-M3 cortex): 7 - Supraganglionic infarction (M4-M6 cortex): 3 Total score (0-10 with 10 being normal): 10 IMPRESSION: There is no acute intracranial hemorrhage or evidence of acute infarction. ASPECT score is 10. 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 These results were communicated to Dr. Leonie Man at 1:52 pm on 05/25/2020 by text page via the [facility] messaging system. Electronically Signed   By: Macy Mis M.D.   On:  05/25/2020 13:54    Shary Key, DO 05/25/2020, 3:43 PM PGY-1, [facility] pager: [phone], text pages welcome  FPTS Upper-Level Resident Addendum   I have independently interviewed and examined the patient. I have discussed the above with the original author and agree with their documentation. Please see also any attending notes.   Gifford Shave, MD PGY-2, [facility] 05/25/2020 6:43 PM  [facility] pager: [phone] (text pages welcome through [facility])

## 2020-05-25 NOTE — ED Provider Notes (Signed)
[facility] EMERGENCY DEPARTMENT Provider Note   CSN: [id number] Arrival date & time: 05/25/20  1336  An emergency department physician performed an initial assessment on this suspected stroke patient at 0138.  History Chief Complaint  Patient presents with  . Code Stroke    Donna [name] is a 70 y.o. female.  Pt presents to the ED today with left arm and leg weakness and slurred speech.  Pt lives at home alone and is fully functional.  She was last normal at 2300 last night when her daughter spoke with her and she went to bed.  Pt said she woke up and could not move her left arm or leg.  Her daughter called her after work and noticed that her speech was slurred.  Her daughter went to pt's house and found her with the weakness, so she called EMS.  A code stroke was activated by EMS.        Past Medical History:  Diagnosis Date  . Allergy   . Anxiety   . Depression   . Hyperlipidemia    no meds   . Hypertension     There are no problems to display for this patient.   Past Surgical History:  Procedure Laterality Date  . DENTAL SURGERY    . NSVD     x2  . TUBAL LIGATION  1976     OB History   No obstetric history on file.     Family History  Problem Relation Age of Onset  . Colon cancer Mother        dx'd in her 27's   . Colon polyps Sister   . Esophageal cancer Sister   . Lung cancer Sister   . Breast cancer Sister   . Rectal cancer Neg Hx   . Stomach cancer Neg Hx   . Pancreatic cancer Neg Hx     Social History   Tobacco Use  . Smoking status: Current Every Day Smoker  . Smokeless tobacco: Never Used  . Tobacco comment: 8 cigs a day   Substance Use Topics  . Alcohol use: No  . Drug use: No    Home Medications Prior to Admission medications   Medication Sig Start Date End Date Taking? Authorizing Provider  busPIRone (BUSPAR) 5 MG tablet Take 5 mg by mouth 2 (two) times daily. 11/09/16  Yes [provider]   fluticasone (FLONASE) 50 MCG/ACT nasal spray Place 1 spray into both nostrils daily. 02/21/20  Yes [provider]  hydrochlorothiazide (HYDRODIURIL) 25 MG tablet Take 25 mg by mouth daily. 10/16/16   [provider]  IBU 800 MG tablet Take 800 mg by mouth every 6 (six) hours as needed for mild pain. 11/09/16   [provider]  ipratropium (ATROVENT) 0.03 % nasal spray Place 2 sprays into both nostrils 2 (two) times daily. 10/16/16   [provider]  lisinopril (PRINIVIL,ZESTRIL) 20 MG tablet Take 20 mg by mouth daily. 11/09/16   [provider]  montelukast (SINGULAIR) 10 MG tablet Take 10 mg by mouth at bedtime. 11/26/16   [provider]  NICODERM CQ 14 MG/24HR patch Place 14 mg onto the skin daily. 10/14/16   [provider]  PROAIR HFA 108 (90 Base) MCG/ACT inhaler Inhale 1-2 puffs into the lungs every 4 (four) hours as needed for wheezing or shortness of breath. 11/26/16   [provider]  traZODone (DESYREL) 50 MG tablet Take 50 mg by mouth at bedtime. 11/26/16  [provider]    Allergies    Patient has no known allergies.  Review of Systems   Review of Systems  Neurological: Positive for speech difficulty and weakness.  All other systems reviewed and are negative.   Physical Exam Updated Vital Signs BP (!) 157/74   Pulse 77   Temp 97.9 F (36.6 C) (Oral)   Resp 18   SpO2 98%   Physical Exam Vitals and nursing note reviewed.  HENT:     Head: Normocephalic and atraumatic.     Right Ear: External ear normal.     Left Ear: External ear normal.     Nose: Nose normal.     Mouth/Throat:     Mouth: Mucous membranes are moist.     Pharynx: Oropharynx is clear.  Eyes:     Extraocular Movements: Extraocular movements intact.     Conjunctiva/sclera: Conjunctivae normal.     Pupils: Pupils are equal, round, and reactive to light.  Cardiovascular:     Rate and Rhythm: Normal rate and regular rhythm.      Pulses: Normal pulses.     Heart sounds: Normal heart sounds.  Pulmonary:     Effort: Pulmonary effort is normal.     Breath sounds: Normal breath sounds.  Abdominal:     General: Abdomen is flat. Bowel sounds are normal.     Palpations: Abdomen is soft.  Musculoskeletal:        General: Normal range of motion.     Cervical back: Normal range of motion and neck supple.  Skin:    General: Skin is warm.     Capillary Refill: Capillary refill takes less than 2 seconds.  Neurological:     Mental Status: She is alert.     Comments: Left facial weakness; left arm and leg weakness  Psychiatric:        Mood and Affect: Mood normal.     ED Results / Procedures / Treatments   Labs (all labs ordered are listed, but only abnormal results are displayed) Labs Reviewed  CBC - Abnormal; Notable for the following components:      Result Value   WBC 11.4 (*)    Hemoglobin 15.3 (*)    HCT 46.2 (*)    All other components within normal limits  COMPREHENSIVE METABOLIC PANEL - Abnormal; Notable for the following components:   Total Protein 6.2 (*)    Albumin 3.3 (*)    Anion gap 4 (*)    All other components within normal limits  RESP PANEL BY RT-PCR (FLU A&B, COVID) ARPGX2  PROTIME-INR  APTT  DIFFERENTIAL  URINALYSIS, ROUTINE W REFLEX MICROSCOPIC  LIPID PANEL  HEMOGLOBIN A1C  I-STAT CHEM 8, ED  CBG MONITORING, ED    EKG EKG Interpretation  Date/Time:  Wednesday May 25 2020 14:13:32 EDT Ventricular Rate:  81 PR Interval:  139 QRS Duration: 85 QT Interval:  411 QTC Calculation: 478 R Axis:   34 Text Interpretation: Sinus rhythm Probable left atrial enlargement No old tracing to compare Confirmed by Isla Pence [phone]) on 05/25/2020 2:15:17 PM   Radiology CT HEAD CODE STROKE WO CONTRAST  Result Date: 05/25/2020 CLINICAL DATA:  Code stroke. EXAM: CT HEAD WITHOUT CONTRAST TECHNIQUE: Contiguous axial images were obtained from the base of the skull through the vertex  without intravenous contrast. COMPARISON:  None. FINDINGS: Brain: There is no acute intracranial hemorrhage, mass effect, or edema. Gray-white differentiation is preserved. 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  Ventricles and sulci are normal in size and configuration. No extra-axial collection. Vascular: No hyperdense vessel. There is intracranial atherosclerotic calcification at the skull base. Skull: Unremarkable. Sinuses/Orbits: No acute abnormality. Other: Mastoid air cells are clear. ASPECTS ([place] Stroke Program Early CT Score) - Ganglionic level infarction (caudate, lentiform nuclei, internal capsule, insula, M1-M3 cortex): 7 - Supraganglionic infarction (M4-M6 cortex): 3 Total score (0-10 with 10 being normal): 10 IMPRESSION: There is no acute intracranial hemorrhage or evidence of acute infarction. ASPECT score is 10. 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 These results were communicated to Dr. Leonie Man at 1:52 pm on 05/25/2020 by text page via the [facility] messaging system. Electronically Signed   By: Macy Mis M.D.   On: 05/25/2020 13:54    Procedures Procedures   Medications Ordered in ED Medications  sodium chloride flush (NS) 0.9 % injection 3 mL (3 mLs Intravenous Given 05/25/20 1418)    ED Course  I have reviewed the triage vital signs and the nursing notes.  Pertinent labs & imaging results that were available during my care of the patient were reviewed by me and considered in my medical decision making (see chart for details).    MDM Rules/Calculators/A&P                          Pt went directly to the CT scanner and was met upon arrival by the stroke team.  NIHSS of 6.  MRI/MRA brain ordered and is pending.  NEurology recommends admission to medicine for stroke work up.  She is out of the window for tpa.  Pt d/w FP resident for admission.  CRITICAL CARE Performed by: Isla Pence   Total critical care time: 30  minutes  Critical care time was exclusive of separately billable procedures and treating other patients.  Critical care was necessary to treat or prevent imminent or life-threatening deterioration.  Critical care was time spent personally by me on the following activities: development of treatment plan with patient and/or surrogate as well as nursing, discussions with consultants, evaluation of patient's response to treatment, examination of patient, obtaining history from patient or surrogate, ordering and performing treatments and interventions, ordering and review of laboratory studies, ordering and review of radiographic studies, pulse oximetry and re-evaluation of patient's condition.   Final Clinical Impression(s) / ED Diagnoses Final diagnoses:  Cerebrovascular accident (CVA), unspecified mechanism ([place])    Rx / [place] Orders ED Discharge Orders    None       Isla Pence, MD 05/25/20 1505

## 2020-05-25 NOTE — Progress Notes (Signed)
I have seen and examined this patient.  I discussed with the full resident team.  We have agreed on a plan.  I will co-sign the H&PE when available.  Briefly, 70 yo female with onset of left hemiparesis (left leg weakness most prominent) noted last night when she got up to urinate.  Went back to bed.  Weakness persisted this morning and came to ER.  Still has left sided weakness affecting leg, arm and to a slight extent the face.  Issues. 1. 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  We get our full stroke WU.  Has a history of intermitant palpitations over the last week raising the possibility of occult A fib.  We will see what the workup shows.  Only known risk factor is hypertension.  No known hypercholesterolemia or DM.  Not on ASA or statin on admit. 2. Joy and grief - the yin/yang of life.  Son just got married.  "I was so happy and looked good in my dress as mother of the groom."  Then her brother died after a long bout with cancer.  And she tried to continue to work through these emotional highs and lows.  The acute CVA adds to this emotional roller coaster.

## 2020-05-26 ENCOUNTER — Inpatient Hospital Stay (HOSPITAL_COMMUNITY): Payer: Medicare Other

## 2020-05-26 DIAGNOSIS — F17210 Nicotine dependence, cigarettes, uncomplicated: Secondary | ICD-10-CM | POA: Diagnosis not present

## 2020-05-26 DIAGNOSIS — I6389 Other cerebral infarction: Secondary | ICD-10-CM | POA: Diagnosis not present

## 2020-05-26 DIAGNOSIS — I639 Cerebral infarction, unspecified: Secondary | ICD-10-CM | POA: Diagnosis not present

## 2020-05-26 DIAGNOSIS — I6300 Cerebral infarction due to thrombosis of unspecified precerebral artery: Secondary | ICD-10-CM

## 2020-05-26 DIAGNOSIS — I10 Essential (primary) hypertension: Secondary | ICD-10-CM | POA: Diagnosis not present

## 2020-05-26 LAB — BASIC METABOLIC PANEL
Anion gap: 7 (ref 5–15)
BUN: 17 mg/dL (ref 8–23)
CO2: 21 mmol/L — ABNORMAL LOW (ref 22–32)
Calcium: 9.1 mg/dL (ref 8.9–10.3)
Chloride: 110 mmol/L (ref 98–111)
Creatinine, Ser: 0.83 mg/dL (ref 0.44–1.00)
GFR, Estimated: >60 mL/min (ref >60)
Glucose, Bld: 89 mg/dL (ref 70–99)
Potassium: 3.7 mmol/L (ref 3.5–5.1)
Sodium: 138 mmol/L (ref 135–145)

## 2020-05-26 LAB — ECHOCARDIOGRAM COMPLETE
Area-P 1/2: 1.7 cm2
S' Lateral: 2.6 cm

## 2020-05-26 LAB — HEMOGLOBIN A1C
Hgb A1c MFr Bld: 5 % (ref 4.8–5.6)
Mean Plasma Glucose: 96.8 mg/dL

## 2020-05-26 LAB — LIPID PANEL
Cholesterol: 168 mg/dL (ref 0–200)
HDL: 47 mg/dL (ref >40)
LDL Cholesterol: 83 mg/dL (ref 0–99)
Total CHOL/HDL Ratio: 3.6 RATIO
Triglycerides: 192 mg/dL — ABNORMAL HIGH (ref <150)
VLDL: 38 mg/dL (ref 0–40)

## 2020-05-26 LAB — CBC
HCT: 43.1 % (ref 36.0–46.0)
Hemoglobin: 14.4 g/dL (ref 12.0–15.0)
MCH: 32.9 pg (ref 26.0–34.0)
MCHC: 33.4 g/dL (ref 30.0–36.0)
MCV: 98.4 fL (ref 80.0–100.0)
Platelets: 247 10*3/uL (ref 150–400)
RBC: 4.38 MIL/uL (ref 3.87–5.11)
RDW: 12.9 % (ref 11.5–15.5)
WBC: 10.6 10*3/uL — ABNORMAL HIGH (ref 4.0–10.5)
nRBC: 0 % (ref 0.0–0.2)

## 2020-05-26 LAB — HIV ANTIBODY (ROUTINE TESTING W REFLEX): HIV Screen 4th Generation wRfx: NONREACTIVE

## 2020-05-26 MED ORDER — CLOPIDOGREL BISULFATE 75 MG PO TABS
75.0000 mg | ORAL_TABLET | Freq: Every day | ORAL | Status: DC
  Administered 2020-05-26 – 2020-05-31 (×6): 75 mg via ORAL
  Filled 2020-05-26 (×6): qty 1

## 2020-05-26 MED ORDER — ROSUVASTATIN CALCIUM 20 MG PO TABS
20.0000 mg | ORAL_TABLET | Freq: Every day | ORAL | Status: DC
  Administered 2020-05-26 – 2020-05-31 (×6): 20 mg via ORAL
  Filled 2020-05-26 (×6): qty 1

## 2020-05-26 MED ORDER — ASPIRIN EC 81 MG PO TBEC
81.0000 mg | DELAYED_RELEASE_TABLET | Freq: Every day | ORAL | Status: DC
  Administered 2020-05-26 – 2020-05-31 (×6): 81 mg via ORAL
  Filled 2020-05-26 (×6): qty 1

## 2020-05-26 MED ORDER — DULOXETINE HCL 60 MG PO CPEP
60.0000 mg | ORAL_CAPSULE | Freq: Every day | ORAL | Status: DC
  Administered 2020-05-26 – 2020-05-31 (×6): 60 mg via ORAL
  Filled 2020-05-26 (×6): qty 1

## 2020-05-26 MED ORDER — BUSPIRONE HCL 10 MG PO TABS
5.0000 mg | ORAL_TABLET | Freq: Two times a day (BID) | ORAL | Status: DC
  Administered 2020-05-26 – 2020-05-31 (×12): 5 mg via ORAL
  Filled 2020-05-26 (×12): qty 1

## 2020-05-26 NOTE — Hospital Course (Addendum)
Donna [name] is a 70 y.o. female presenting with L sided  . PMH is significant for hypertension, hyperlipidemia, depression session and anxiety, tobacco use.   Acute CVA with residual left-sided weakness Patient presented to the ED as a code stroke.  She was outside tPA window.  Initial head CT without acute infarction.  MRI brain/MRA head and neck notable for acute small infarcts along the roof of the right temporal horn and head of the right hippocampus, acute or subacute punctate cortical right occipital and left temporal infarcts and a possible punctate subacute infarct of posterior limb of the right internal capsule. 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  Carotid ultrasound showed 1-39% stenosis in b/l carotid arteries. Patient was started on dual antiplatelet therapy with aspirin and Plavix and should remain on DAPT for 3 months, followed by aspirin alone.  Her risk stratification labs only notable for increased LDL of 83 (LDL goal less than 70).  She was started on rosuvastatin 20 mg.  Hemoglobin A1c was 5.  Allowed for permissive hypertension for the first 48 hours and then started on Amlodipine 5 mg.   Patient worked with PT/OT during hospitalization, they recommended CIR placement.  Discussed with cardiology as well for 30-day heart monitor to assess for paroxysmal atrial fibrillation.   Hypertension Permissive hypertension allowed following acute stroke for the first 48 hours.  She was started on Amlodipine 5 mg on 5/13. Monitor pressures outpatient.   Hyperlipidemia LDL increased at 83, goal is less than 70.  She was started on rosuvastatin 20 mg daily.  Anxiety  Depression  Grief Patient was emotionally distressed and still grieving the recent loss of her brother while also dealing with her acute  stroke.  Chaplain was consulted during admission.  She was restarted on her home Cymbalta and BuSpar.  She would likely benefit from outpatient therapy and mood check.  Follow-up recommendations: Started on rosuvastatin 20 mg.  Recheck lipid panel in 4 to 6 weeks. Discharged with Cymbalta and BuSpar.  Please provide resources for therapy/counseling.  Mood check. Will need dual antiplatelet therapy with aspirin and Plavix x3 months, followed by aspirin alone. Will need neurology follow-up in 6-weeks. Continue to encourage tobacco cessation BP follow up- discharged on Amlodipine 10 mg daily.

## 2020-05-26 NOTE — Progress Notes (Signed)
OT Cancellation Note  Patient Details Name: Donna [name] MRN: [id number] DOB: [birth year]   Cancelled Treatment:    Reason Eval/Treat Not Completed: Patient at procedure or test/ unavailable (ECHO). Will return as schedule allows.  [place], OTR/L Acute Rehab Pager: [phone] Office: [phone] 05/26/2020, 9:45 AM

## 2020-05-26 NOTE — Progress Notes (Signed)
Occupational Therapy Evaluation Patient Details Name: Donna [name] MRN: [id number] DOB: 1950/10/23 Today's Date: 05/26/2020    History of Present Illness 70 yo female presenting to ED via EMS on 5/11 with L sided weakness. 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 PMH including HTN, HLD, smoker, anxiety, and depression.   Clinical Impression   PTA, pt was living alone and was independent and working; reports she has her children who lives nearby and can assist. Pt currently requiring Mod A for UB ADLs, Max A for LB ADLs, and Mod A for functional mobility with RW. Pt presenting with decreased functional use of LUE/LLE, poor balance, weakness, vision deficits, and decreased cognition. Pt highly motivated and very frustrated at this functional change. Pt would benefit from further acute OT to facilitate safe dc. Recommend dc to CIR for intensive OT to optimize safety, independence with ADLs, and return to PLOF.     Follow Up Recommendations  CIR    Equipment Recommendations  3 in 1 bedside commode    Recommendations for Other Services PT consult;Rehab consult;Speech consult     Precautions / Restrictions Precautions Precautions: Fall      Mobility Bed Mobility Overal bed mobility: Needs Assistance Bed Mobility: Supine to Sit     Supine to sit: Min assist;HOB elevated     General bed mobility comments: Min A for elevating trunk    Transfers Overall transfer level: Needs assistance Equipment used: Rolling walker (2 wheeled) Transfers: Sit to/from Stand Sit to Stand: Min assist         General transfer comment: Min A for power up    Balance Overall balance assessment: Needs assistance Sitting-balance support: No upper extremity  supported;Feet supported Sitting balance-Leahy Scale: Fair     Standing balance support: Bilateral upper extremity supported;During functional activity Standing balance-Leahy Scale: Poor Standing balance comment: reliant on UE support and physical A                           ADL either performed or assessed with clinical judgement   ADL Overall ADL's : Needs assistance/impaired Eating/Feeding: Minimal assistance;Sitting Eating/Feeding Details (indicate cue type and reason): Min A for bilateral coorindation Grooming: Minimal assistance;Sitting   Upper Body Bathing: Moderate assistance;Sitting   Lower Body Bathing: Maximal assistance;Sit to/from stand   Upper Body Dressing : Moderate assistance;Sitting   Lower Body Dressing: Maximal assistance;Sit to/from stand   Toilet Transfer: Moderate assistance;Ambulation;RW (simualted to recliner)           Functional mobility during ADLs: Moderate assistance;Rolling walker General ADL Comments: Pt presenting with decreased functuional use of LUE, balance, strength, vision, cognition, and actiity tolerance     Vision   Vision Assessment?: Yes Eye Alignment: Within Functional Limits Alignment/Gaze Preference: Head turned;Gaze right Tracking/Visual Pursuits: Requires cues, head turns, or add eye shifts to track;Decreased smoothness of horizontal tracking Additional Comments: Prefernece for R head turn and gaze. Able to look to L with cues.     Perception Perception Perception Tested?: Yes Perception Deficits: Inattention/neglect Inattention/Neglect: Does not attend to left side of body;Does not attend to left visual field   Praxis      Pertinent Vitals/Pain Pain Assessment: Faces Faces Pain Scale: No hurt Pain Intervention(s): Monitored during session  Hand Dominance Right   Extremity/Trunk Assessment Upper Extremity Assessment Upper Extremity Assessment: LUE deficits/detail LUE Deficits / Details: Able to  perform AROM flex/ext at hand and elbow. Forward flexion of shoulder to ~120* with compensatory hiking. Poor grasp strength. LUE Coordination: decreased fine motor;decreased gross motor   Lower Extremity Assessment Lower Extremity Assessment: Defer to PT evaluation       Communication Communication Communication: Expressive difficulties   Cognition Arousal/Alertness: Awake/alert Behavior During Therapy: Flat affect Overall Cognitive Status: Impaired/Different from baseline Area of Impairment: Attention;Problem solving;Following commands;Awareness                   Current Attention Level: Sustained   Following Commands: Follows one step commands inconsistently;Follows multi-step commands inconsistently   Awareness: Intellectual Problem Solving: Slow processing;Requires verbal cues;Difficulty sequencing General Comments: Pt requiring increased time throughout. Following single step commands with increased time.   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        OT Problem List: Decreased range of motion;Decreased activity tolerance;Decreased strength;Impaired balance (sitting and/or standing);Decreased safety awareness;Decreased knowledge of use of DME or AE;Decreased knowledge of precautions;Impaired UE functional use      OT Treatment/Interventions: Therapeutic exercise;Self-care/ADL training;Energy conservation;DME and/or AE instruction;Therapeutic activities;Patient/family education;Balance training;Visual/perceptual remediation/compensation    OT  Goals(Current goals can be found in the care plan section) Acute Rehab OT Goals Patient Stated Goal: "Get better" OT Goal Formulation: With patient Time For Goal Achievement: 06/09/20 Potential to Achieve Goals: Good  OT Frequency: Min 2X/week   Barriers to D/C:            Co-evaluation              AM-PAC OT "6 Clicks" Daily Activity     Outcome Measure Help from another person eating meals?: A Little Help from another person taking care of personal grooming?: A Little Help from another person toileting, which includes using toliet, bedpan, or urinal?: A Lot Help from another person bathing (including washing, rinsing, drying)?: A Lot Help from another person to put on and taking off regular upper body clothing?: A Lot Help from another person to put on and taking off regular lower body clothing?: A Lot 6 Click Score: 14   End of Session Equipment Utilized During Treatment: Rolling walker;Gait belt Nurse Communication: Mobility status  Activity Tolerance: Patient tolerated treatment well Patient left: in chair;with call bell/phone within reach;with chair alarm set  OT Visit Diagnosis: Unsteadiness on feet (R26.81);Other abnormalities of gait and mobility (R26.89);Muscle weakness (generalized) (M62.81);Hemiplegia and hemiparesis Hemiplegia - Right/Left: Left Hemiplegia - dominant/non-dominant: Non-Dominant Hemiplegia - caused by: Cerebral infarction                Time: 1020-1036 OT Time Calculation (min): 16 min Charges:  OT General Charges $OT Visit: 1 Visit OT Evaluation $OT Eval Moderate Complexity: [place], OTR/L Acute Rehab Pager: [phone] Office: [place]  Myia Bergh 05/26/2020, 12:51 PM

## 2020-05-26 NOTE — Evaluation (Signed)
Physical Therapy Evaluation Patient Details Name: Donna [name] MRN: [id number] DOB: 1951/01/16 Today's Date: 05/26/2020   History of Present Illness  Pt is 70 yo female presenting to ED via EMS on 5/11 with L sided weakness. 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 PMH including HTN, HLD, smoker, anxiety, and depression.  Clinical Impression   Pt admitted with above diagnosis. Pt was lethargic at time of PT evaluation but still motivated to participate.  She presented with L sided weakness, decreased balance, decreased safety awareness with some impulsiveness, and decreased mobility.  Pt also emotional during evaluation - expressing worry about not recovering.  She is normally very independent and has family support.  She has excellent rehab potential -strongly recommend CIR. Pt currently with functional limitations due to the deficits listed below (see PT Problem List). Pt will benefit from skilled PT to increase their independence and safety with mobility to allow discharge to the venue listed below.       Follow Up Recommendations CIR    Equipment Recommendations  Rolling walker with 5" wheels (further assessment post acute)    Recommendations for Other Services Rehab consult     Precautions / Restrictions Precautions Precautions: Fall      Mobility  Bed Mobility Overal bed mobility: Needs Assistance Bed Mobility: Supine to Sit;Sit to Supine     Supine to sit: Min assist;HOB elevated Sit to supine: Min assist;HOB elevated   General bed mobility comments: Min A for L LE and to stabilize    Transfers Overall transfer level: Needs assistance Equipment used: 1 person hand held assist Transfers: Sit to/from [facility] Sit to  Stand: Min assist Stand pivot transfers: Max assist;Mod assist       General transfer comment: Min A to stand from bed and mod A to pivot to [facility] toward L side. After using BSC, pt began to stand before PT had gait belt and lost balance requiring max A for pivot back to bed.  Pt stating she wanted to try on her own once she had sat down.  Ambulation/Gait Ambulation/Gait assistance: Mod assist Gait Distance (Feet): 3 Feet Assistive device: Rolling walker (2 wheeled) Gait Pattern/deviations: Step-to pattern;Decreased stride length;Decreased weight shift to left;Decreased stance time - left;Decreased dorsiflexion - left Gait velocity: decreased   General Gait Details: Limited distance due to pt lethargic.  She ambulated earlier around bed with OT.  Recommend A of 2 to progress further  Stairs            Wheelchair Mobility    Modified Rankin (Stroke Patients Only) Modified Rankin (Stroke Patients Only) Pre-Morbid Rankin Score: No symptoms Modified Rankin: Moderately severe disability     Balance Overall balance assessment: Needs assistance Sitting-balance support: Single extremity supported;No upper extremity supported Sitting balance-Leahy Scale: Fair Sitting balance - Comments: Pt preferring R UE support and needs R UE support with even small challenges to balance. Could maintain static but could not weight shift at all or move extremity without support   Standing balance support: Bilateral upper extremity supported;During functional activity Standing balance-Leahy Scale: Poor Standing balance comment: reliant on UE support and physical A  Pertinent Vitals/Pain Pain Assessment: No/denies pain Faces Pain Scale: No hurt Pain Intervention(s): Monitored during session    Home Living Family/patient expects to be discharged to:: Inpatient rehab Living Arrangements: Alone Available Help at Discharge: Family           Home  Equipment: None      Prior Function Level of Independence: Independent         Comments: ADLs, IADLs, and works as a Librarian, 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     Hand Dominance   Dominant Hand: Right    Extremity/Trunk Assessment   Upper Extremity Assessment Upper Extremity Assessment: Defer to OT evaluation L   Lower Extremity Assessment Lower Extremity Assessment: LLE deficits/detail LLE Deficits / Details: ROM WFL but did not plantar flexors tighter on L compared to R - ?increased tone.  MMT: 1/5 throughout - approaching 2/5 but still 1/5 LLE Sensation: WNL LLE Coordination: decreased fine motor;decreased gross motor    Cervical / Trunk Assessment Cervical / Trunk Assessment: Normal  Communication   Communication: Expressive difficulties  Cognition Arousal/Alertness: Lethargic Behavior During Therapy: Anxious;Impulsive Overall Cognitive Status: Impaired/Different from baseline Area of Impairment: Attention;Problem solving;Following commands;Awareness;Safety/judgement                   Current Attention Level: Sustained   Following Commands: Follows one step commands inconsistently;Follows multi-step commands inconsistently Safety/Judgement: Decreased awareness of safety;Decreased awareness of deficits Awareness: Intellectual Problem Solving: Slow processing;Requires verbal cues;Difficulty sequencing General Comments: Pt emotional at times -stating "am I always going to be like this? I don't want others to have to take care of me"; She was lethargic at time of PT eval (saw OT earlier and recently returned to bed).  Pt also somewhat impulsive in starting transfers - stated "I wanted to try on my own", "I want to get better too quick"      General Comments  Pt expressing worry about not recovering and crying at times. Discussed this is early in her new stroke and that she has potential to improve with therapy.     Exercises     Assessment/Plan    PT  Assessment Patient needs continued PT services  PT Problem List Decreased strength;Decreased mobility;Decreased safety awareness;Decreased range of motion;Decreased coordination;Decreased knowledge of precautions;Decreased activity tolerance;Decreased cognition;Decreased balance;Decreased knowledge of use of DME       PT Treatment Interventions DME instruction;Therapeutic activities;Gait training;Therapeutic exercise;Patient/family education;Stair training;Balance training;Functional mobility training;Cognitive remediation    PT Goals (Current goals can be found in the Care Plan section)  Acute Rehab PT Goals Patient Stated Goal: "Get better" PT Goal Formulation: With patient Time For Goal Achievement: 06/09/20 Potential to Achieve Goals: Good Additional Goals Additional Goal #1: Will increase L LE strength to 3/5 for improved safety with gait and stairs    Frequency Min 4X/week   Barriers to discharge        Co-evaluation               AM-PAC PT "6 Clicks" Mobility  Outcome Measure Help needed turning from your back to your side while in a flat bed without using bedrails?: A Little Help needed moving from lying on your back to sitting on the side of a flat bed without using bedrails?: A Little Help needed moving to and from a bed to a chair (including a wheelchair)?: A Lot Help needed standing up from a chair using your arms (e.g., wheelchair or bedside chair)?: A Lot Help needed to walk in hospital room?: A  Lot Help needed climbing 3-5 steps with a railing? : Total 6 Click Score: 13    End of Session Equipment Utilized During Treatment: Gait belt Activity Tolerance: Patient tolerated treatment well Patient left: in bed;with call bell/phone within reach;with bed alarm set Nurse Communication: Mobility status PT Visit Diagnosis: Other abnormalities of gait and mobility (R26.89);Muscle weakness (generalized) (M62.81);Hemiplegia and hemiparesis Hemiplegia - Right/Left:  Left Hemiplegia - dominant/non-dominant: Non-dominant Hemiplegia - caused by: Cerebral infarction    Time: 1302-1328 PT Time Calculation (min) (ACUTE ONLY): 26 min   Charges:   PT Evaluation $PT Eval Moderate Complexity: 1 Mod PT Treatments $Therapeutic Activity: 8-22 mins        Abran Richard, PT Acute Rehab Services Pager [phone] [facility] [phone]    Karlton Lemon 05/26/2020, 2:38 PM

## 2020-05-26 NOTE — Progress Notes (Signed)
  Echocardiogram 2D Echocardiogram has been performed.  Donna [name] 05/26/2020, 9:43 AM

## 2020-05-26 NOTE — Progress Notes (Addendum)
STROKE TEAM PROGRESS NOTE   INTERVAL HISTORY No acute events  Left side remains weak, therapy teams recommending CIR. Daughter updated by speaker phone in room  on rounds.  We discussed her stroke diagnosis, ongoing work up and plan of care. Questions were addressed. MRI scan of the brain shows multifocal infarcts involving right medial temporal lobe, right internal capsule posterior limb, right parietal occipital and left temporal regions.  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 Vitals:   05/25/20 2332 05/26/20 0132 05/26/20 0400 05/26/20 0734  BP: (!) 159/63 131/62 (!) 146/77 (!) 167/77  Pulse: 79 82 75 71  Resp:   18 17  Temp: 98 F (36.7 C) 98.3 F (36.8 C) 97.6 F (36.4 C) (!) 97.5 F (36.4 C)  TempSrc: Oral Oral Oral Oral  SpO2: 98% 97% 99% 96%   CBC:  Recent Labs  Lab 05/25/20 1340 05/25/20 1349 05/26/20 0319  WBC 11.4*  --  10.6*  NEUTROABS 6.7  --   --   HGB 15.3* 15.0 14.4  HCT 46.2* 44.0 43.1  MCV 99.6  --  98.4  PLT 261  --  557   Basic Metabolic Panel:  Recent Labs  Lab 05/25/20 1340 05/25/20 1349 05/26/20 0319  NA 137 140 138  K 4.9 4.6 3.7  CL 109 110 110  CO2 24  --  21*  GLUCOSE 97 88 89  BUN 20 22 17   CREATININE 1.00 0.90 0.83  CALCIUM 9.1  --  9.1   Lipid Panel:  Recent Labs  Lab 05/26/20 0319  CHOL 168  TRIG 192*  HDL 47  CHOLHDL 3.6  VLDL 38  LDLCALC 83   HgbA1c:  Recent Labs  Lab 05/26/20 0319  HGBA1C 5.0   Urine Drug Screen: No results for input(s): LABOPIA, COCAINSCRNUR, LABBENZ, AMPHETMU, THCU, LABBARB in the last 168 hours.  Alcohol Level No results for input(s): ETH in the last 168 hours.  IMAGING  CT head There is no acute intracranial hemorrhage or evidence of acute infarction. ASPECT score is 10.  Age-indeterminate small vessel infarct of right corona radiata  MR Brain Acute  small infarcts along the roof of the right temporal horn and head of the right hippocampus. Acute or subacute punctate cortical right occipital and left temporal infarcts. Possible punctate subacute infarct of posterior limb of right internal capsule.  Chronic infarct right corona radiata.  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 Intracranially, suspect retrograde flow to the PICA origin. Otherwise patent anterior and posterior circulations without stenosis.  2D Echo Left Ventricle: Left ventricular ejection fraction, by estimation, is 65 to 70%. The left ventricle has normal function. The left ventricle has no regional wall motion abnormalities. The left ventricular internal cavity size was normal in size. There is  no left ventricular hypertrophy. Left ventricular diastolic parameters were normal.   Right Ventricle: The right ventricular size is normal. No increase in  right ventricular wall thickness. Right ventricular systolic function is normal. There is normal pulmonary artery systolic pressure. The tricuspid regurgitant velocity is 2.24 m/s, and  with an assumed right atrial pressure of 3 mmHg, the estimated right ventricular systolic pressure is 32.2 mmHg.   Left Atrium: Left atrial size was normal in size.   Right Atrium: Right atrial size was normal in size.   Pericardium: There is no evidence of  pericardial effusion.   Mitral Valve: The mitral valve is normal in structure. No evidence of  mitral valve regurgitation. No evidence of mitral valve stenosis.   Tricuspid Valve: The tricuspid valve is grossly normal. Tricuspid valve regurgitation is trivial.   Aortic Valve: The aortic valve is normal in structure. Aortic valve  regurgitation is not visualized. No aortic stenosis is present.   Pulmonic Valve: The pulmonic valve was grossly normal. Pulmonic valve regurgitation is not visualized.   Aorta: Aortic dilatation noted. There  is mild dilatation of the ascending aorta, measuring 42 mm.   IAS/Shunts: The atrial septum is grossly normal.   PHYSICAL EXAM Pleasant elderly African-American lady not in distress. . Afebrile. Head is nontraumatic. Neck is supple without bruit.    Cardiac exam no murmur or gallop. Lungs are clear to auscultation. Distal pulses are well felt. Neurological Exam:  She is awake alert oriented to time place and person.  Speech and language appear normal.  Extraocular movements are full range without nystagmus.  She has slight visual inattention to the left is able to see in the left field of vision.  Mild left lower facial asymmetry.  Tongue midline.  Motor system exam shows mild left upper extremity drift with weakness predominantly of the left grip and intrinsic hand muscles.  Significant left lower extremity drift with only 1/5 strength.  Normal strength on the right.  Decreased left hemibody touch pinprick sensation.  Reflexes are normal on the right and depressed on the left.  Left plantar equivocal right downgoing.  Gait not tested.  ASSESSMENT/PLAN Donna [name] is a 70 y.o. female with a medical history significant for hypertension, hyperlipidemia, and tobacco use who presented to the ED as a Code Stroke via EMS for evaluation after waking up with stroke symptoms consisting of left mouth droop and left-sided weakness and sensation of left leg heaviness with impaired balance. She reported non-compliance with medications x6 months. NIHSS 6. Initial CT head without acute infarction but with age-indeterminate small vessel infarct of right corona radiata  MRI showed acute small infarcts along the roof of the right temporal horn and head of the right hippocampus. Acute or subacute punctate cortical right occipital and left temporal infarcts. Possible punctate subacute infarct of posterior limb of right internal capsule. Etiology is likely combination of large vessel disease and cardioembolic given  involvement of different vascular distributions.  Code Stroke CT head No acute abnormality. Age-indeterminate small vessel infarct of right corona radiata was present.   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 Intracranially, suspect retrograde flow to the PICA origin  MRI as above    Carotid Doppler: No significant stenosis   2D Echo EF 65-70%, No thrombus, wall motion abnormality or shunt found.   LDL 83  HgbA1c 5.0  VTE prophylaxis - Lovenox 40mg  ppx    Diet   Diet heart healthy/carb modified Room service appropriate? Yes; Fluid consistency: Thin   No AC/AP prior to admission  DAPT x 3 months followed by aspirin alone.    Therapy recommendations:  CIR by OT. PT and ST pending  Disposition:  TBD  Hypertension  BP 169/80 on admission  Reported non-compliance with BP meds x 6 mos  Stable . Permissive hypertension (OK if < 220/120) but gradually normalize in 5-7 days . Long-term BP goal normotensive  Hyperlipidemia  Home meds:  None  LDL 83, almost at goal < 70  High intensity statin: Crestor 20mg  added  Continue statin at discharge  Other Stroke Risk Factors  Advanced Age >/= 37   Current Cigarette smoker-advised to stop smoking  Other Active Problems     Hospital day # 1 I have personally obtained history,examined this patient, reviewed notes, independently viewed imaging studies, participated in medical decision making and plan of care.ROS completed by me personally and pertinent positives fully documented  I have made any additions or clarifications directly to the above note. Agree with note above.  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  Recommend dual antiplatelet therapy of aspirin  Plavix for 3 months but also a 30-day heart monitor to look for paroxysmal A. fib.  Continue ongoing stroke work-up.  Long discussion with patient and daughter at the bedside and answered questions.  She will likely need ongoing therapies and rehab.  Greater than 50% time during the 35-minute visit was spent in counseling and coordination of care about her stroke and left persistent discussion about evaluation treatment plan and answering questions.  Antony Contras, MD Medical Director [place] Pager: [phone] 05/26/2020 4:38 PM   To contact Stroke Continuity provider, please refer to [email or web address] After hours, contact General Neurology

## 2020-05-26 NOTE — Progress Notes (Signed)
   05/26/20 1420  Clinical Encounter Type  Visited With Patient  Visit Type Initial;Social support;Spiritual support  Referral From Physician  Consult/Referral To Chaplain   Chaplain responded to consult. Pt shared that she has had a lot of her mind lately. She has had a boyfriend recently for the first time in a while and has struggled with the change at times. Pt received a call from said partner during chaplain's visit and said, "it was like medicine, exactly what I needed." Pt values and takes pride in her independence and not relying on others. She does not want to be a burden to others. She said her biggest anxiety right now is her bills and general situation, but no one knows because she doesn't disclose her troubles to anyone. When asked, she said she wouldn't feel comfortable asking family for help. She said that she has told her children in the past that she doesn't want them to give up their lives to take care of her, so they should "send me to live someplace" if it comes to that. She blames her current health situation on not taking care of herself because she is always helping others. She affirms that she needs to focus on herself now. Pt also shared about the recent loss of her brother, 'Buzz Bunny', and how she didn't take the time she needed to grieve and process it. He passed just 2 weeks ago so she is understandably still raw. Pt shared that she feels guilty for not going to church for the past two years, but knows that God loves her. She also shared that she doesn't think God is punishing her. Pt is a self-described fighter and was able to name God, "getting back to where I was," and people she cares about as motivators for getting better. Pt expressed appreciation for the visit and being able to talk. Chaplain remains available.   This note was prepared by Chaplain Resident, Dante Gang, MDiv. Chaplain remains available as needed through the on-call pager: [phone].

## 2020-05-26 NOTE — Progress Notes (Signed)
Carotid duplex has been completed.   Preliminary results in CV Proc.   Abram Sander 05/26/2020 10:48 AM

## 2020-05-26 NOTE — Progress Notes (Addendum)
Family Medicine Teaching Service Daily Progress Note Intern Pager: [phone]  Patient name: Donna [name] Medical record number: [id number] Date of birth: March 13, 1950 Age: 70 y.o. Gender: female  Primary Care Provider: Care, Jinny Blossom [facility] Consultants: Neurology Code Status: FULL  Pt Overview and Major Events to Date:  5/11: Code stroke activated, Admitted  Assessment and Plan: Donna [name] is a 70 y.o. female presenting with L sided  . PMH is significant for hypertension, hyperlipidemia, depression session and anxiety, tobacco use.  Acute CVA with residual left-sided weakness Presented with left sided weakness, NIHSS of 6. TPA not given as patient out of the window. MRI brain/MRA head and neck with acute small infarcts along the roof of the right temporal horn and head of the right hippocampus, acute or subacute punctate cortical right occipital and left temporal infarcts. Also with possible punctate subacute infarct of posterior limb of right internal capsule.  Risk stratification labs notable for elevated LDL 83. Hemoglobin A1c 5. -Neurology following, appreciate care and recommendations -F/u Echo -F/u Carotid ultrasound -Dual antiplatelet therapy with aspirin and Plavix x3 weeks, followed by aspirin alone -Risk modification below  -PT/OT  HTN: Chronic, stable BP's ranging 131-169/59-80. Most recent 146/77. Apparently has not taken her anti-hypertensive medications for the last 6 months.  -Allowing permissive hypertension; Treat BP >220/180 -Normalize BP's in a few days  HLD: Chronic LDL goal <70.  Lipid panel significant for elevated triglycerides 192, LDL 83, cholesterol 168. -Start rosuvastatin 20 mg daily  Anxiety  Depression  Grief Brother passed away last week.  Patient is very tearful, anxious and with depressed affect during encounter. She is amenable to restart her medications for this. Amenable to chaplain consult. -Chaplain consult -Restart home  Cymbalta 60 mg daily -Restart BuSpar 5 mg twice daily  Tobacco Abuse Smokes 1/2 PPD. -Offer Nicotine patch inpatient -Smoking cessation  FEN/GI: Heart healthy/carb modified PPx: Lovenox  Status is: Inpatient  Remains inpatient appropriate because:Inpatient level of care appropriate due to severity of illness   Dispo: The patient is from: Home              Anticipated d/c is to: Home              Patient currently is not medically stable to d/c.   Difficult to place patient No   Subjective:  Patient is very tearful.  She feels like she is having increased anxiety.  She tells me that she has tried to handle the passing of her brother, her work, and her own health independently.  It is all been too much for her.  She is very emotional about her stroke, stating that she does not deserve this. She wishes she asked for help earlier. She is amenable for chaplain consult. She is thankful for the help she is receiving here.   Objective: Temp:  [97.6 F (36.4 C)-98.3 F (36.8 C)] 97.6 F (36.4 C) (05/12 0400) Pulse Rate:  [70-90] 75 (05/12 0400) Resp:  [17-18] 18 (05/12 0400) BP: (131-169)/(59-80) 146/77 (05/12 0400) SpO2:  [96 %-100 %] 99 % (05/12 0400) FiO2 (%):  [21 %] 21 % (05/11 1555) Physical Exam: General: Tearful, anxious appearing, depressed affect, speech is slow and slurred Cardiovascular: RRR, no murmur Respiratory: CTA B, without rhonchi/wheezing/rales Abdomen: Nontender, soft, nondistended Extremities: Without edema, warm and dry Neuro: Cranial Nerves: Visual Fields are full. EOMI without ptosis or diplopia. Facial movement is asymmetric as there is left-sided droop. Hearing is intact to voice. Shoulder shrug is asymmetric, not  present on left-side. Tongue is midline without atrophy or fasciculations. Tone is normal. Bulk is normal. 5/5 grip strength b/l upper extremities, able to lift right lower extremity up against gravity, difficult raising left LE against gravity,  unable to dorsiflex/plantarflex left foot, +pronator drift on left. Dysmetria with FNF on left.   Laboratory: Recent Labs  Lab 05/25/20 1340 05/25/20 1349 05/26/20 0319  WBC 11.4*  --  10.6*  HGB 15.3* 15.0 14.4  HCT 46.2* 44.0 43.1  PLT 261  --  247   Recent Labs  Lab 05/25/20 1340 05/25/20 1349 05/26/20 0319  NA 137 140 138  K 4.9 4.6 3.7  CL 109 110 110  CO2 24  --  21*  BUN 20 22 17   CREATININE 1.00 0.90 0.83  CALCIUM 9.1  --  9.1  PROT 6.2*  --   --   BILITOT 0.3  --   --   ALKPHOS 63  --   --   ALT 17  --   --   AST 22  --   --   GLUCOSE 97 88 89     Imaging/Diagnostic Tests: MR ANGIO HEAD WO CONTRAST  Result Date: 05/25/2020 CLINICAL DATA:  Left facial droop and left-sided weakness EXAM: MRI HEAD WITHOUT CONTRAST MRA HEAD WITHOUT CONTRAST MRA NECK WITHOUT CONTRAST TECHNIQUE: Multiplanar, multiecho pulse sequences of the brain and surrounding structures were obtained without intravenous contrast. Angiographic images of the Circle of Willis were obtained using MRA technique without intravenous contrast. Angiographic images of the neck were obtained using MRA technique without intravenous contrast. Carotid stenosis measurements (when applicable) are obtained utilizing NASCET criteria, using the distal internal carotid diameter as the denominator. COMPARISON:  None. FINDINGS: MRI HEAD FINDINGS Motion artifact is present. Brain: Diffusion hyperintensity with ADC isointensity is present in the region of the posterior limb of the right internal capsule on the axial sequence. However, this is not clearly present on the coronal sequence. There is reduced diffusion in both planes along the roof the right temporal horn and along the medial temporal lobe involving the hippocampus. Punctate cortical foci of diffusion hyperintensity are present in the right occipital lobe and left posterior temporal lobe. Chronic infarct of the right corona radiata. Focus of susceptibility in the left  temporal white matter is most compatible with chronic microhemorrhage. Ventricles and sulci are within normal limits in size and configuration. There is no intracranial mass, mass effect, hydrocephalus, or extra-axial collection. Vascular: Major vessel flow voids at the skull base are preserved. Skull and upper cervical spine: Marrow signal is within normal limits. Sinuses/Orbits: Minor paranasal sinus mucosal thickening. Orbits are unremarkable. Other: Sella is unremarkable.  Mastoid air cells are aerated. MRA HEAD FINDINGS Intracranial internal carotid arteries are patent. Middle and anterior cerebral arteries are patent. Included intracranial vertebral arteries, basilar artery, posterior cerebral arteries are patent. Given above findings on MRA neck, the visualized patent intracranial right vertebral artery may reflect retrograde flow to the PICA origin. Right posterior communicating artery is present. Possible left posterior communicating artery is well. There is no significant stenosis or aneurysm. MRA NECK FINDINGS Motion artifact is present. Common, internal, and external carotid arteries are patent. No hemodynamically significant stenosis is identified. Extracranial left vertebral artery is patent without stenosis. There is no definite flow related enhancement within the extracranial right vertebral artery. IMPRESSION: Degraded by motion artifact. Acute small infarcts along the roof of the right temporal horn and head of the right hippocampus. Acute or subacute punctate cortical right  occipital and left temporal infarcts. Possible punctate subacute infarct of posterior limb of right internal capsule. Chronic infarct right corona radiata. 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 Intracranially, suspect retrograde flow to the PICA origin. Otherwise patent anterior and posterior circulations without stenosis. Electronically Signed   By: Macy Mis M.D.   On: 05/25/2020  16:26   MR ANGIO NECK WO CONTRAST  Result Date: 05/25/2020 CLINICAL DATA:  Left facial droop and left-sided weakness EXAM: MRI HEAD WITHOUT CONTRAST MRA HEAD WITHOUT CONTRAST MRA NECK WITHOUT CONTRAST TECHNIQUE: Multiplanar, multiecho pulse sequences of the brain and surrounding structures were obtained without intravenous contrast. Angiographic images of the Circle of Willis were obtained using MRA technique without intravenous contrast. Angiographic images of the neck were obtained using MRA technique without intravenous contrast. Carotid stenosis measurements (when applicable) are obtained utilizing NASCET criteria, using the distal internal carotid diameter as the denominator. COMPARISON:  None. FINDINGS: MRI HEAD FINDINGS Motion artifact is present. Brain: Diffusion hyperintensity with ADC isointensity is present in the region of the posterior limb of the right internal capsule on the axial sequence. However, this is not clearly present on the coronal sequence. There is reduced diffusion in both planes along the roof the right temporal horn and along the medial temporal lobe involving the hippocampus. Punctate cortical foci of diffusion hyperintensity are present in the right occipital lobe and left posterior temporal lobe. Chronic infarct of the right corona radiata. Focus of susceptibility in the left temporal white matter is most compatible with chronic microhemorrhage. Ventricles and sulci are within normal limits in size and configuration. There is no intracranial mass, mass effect, hydrocephalus, or extra-axial collection. Vascular: Major vessel flow voids at the skull base are preserved. Skull and upper cervical spine: Marrow signal is within normal limits. Sinuses/Orbits: Minor paranasal sinus mucosal thickening. Orbits are unremarkable. Other: Sella is unremarkable.  Mastoid air cells are aerated. MRA HEAD FINDINGS Intracranial internal carotid arteries are patent. Middle and anterior cerebral  arteries are patent. Included intracranial vertebral arteries, basilar artery, posterior cerebral arteries are patent. Given above findings on MRA neck, the visualized patent intracranial right vertebral artery may reflect retrograde flow to the PICA origin. Right posterior communicating artery is present. Possible left posterior communicating artery is well. There is no significant stenosis or aneurysm. MRA NECK FINDINGS Motion artifact is present. Common, internal, and external carotid arteries are patent. No hemodynamically significant stenosis is identified. Extracranial left vertebral artery is patent without stenosis. There is no definite flow related enhancement within the extracranial right vertebral artery. IMPRESSION: Degraded by motion artifact. Acute small infarcts along the roof of the right temporal horn and head of the right hippocampus. Acute or subacute punctate cortical right occipital and left temporal infarcts. Possible punctate subacute infarct of posterior limb of right internal capsule. Chronic infarct right corona radiata. 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 Intracranially, suspect retrograde flow to the PICA origin. Otherwise patent anterior and posterior circulations without stenosis. Electronically Signed   By: Macy Mis M.D.   On: 05/25/2020 16:26   MR BRAIN WO CONTRAST  Result Date: 05/25/2020 CLINICAL DATA:  Left facial droop and left-sided weakness EXAM: MRI HEAD WITHOUT CONTRAST MRA HEAD WITHOUT CONTRAST MRA NECK WITHOUT CONTRAST TECHNIQUE: Multiplanar, multiecho pulse sequences of the brain and surrounding structures were obtained without intravenous contrast. Angiographic images of the Circle of Willis were obtained using MRA technique without intravenous contrast. Angiographic images of the neck were obtained using  MRA technique without intravenous contrast. Carotid stenosis measurements (when applicable) are obtained utilizing  NASCET criteria, using the distal internal carotid diameter as the denominator. COMPARISON:  None. FINDINGS: MRI HEAD FINDINGS Motion artifact is present. Brain: Diffusion hyperintensity with ADC isointensity is present in the region of the posterior limb of the right internal capsule on the axial sequence. However, this is not clearly present on the coronal sequence. There is reduced diffusion in both planes along the roof the right temporal horn and along the medial temporal lobe involving the hippocampus. Punctate cortical foci of diffusion hyperintensity are present in the right occipital lobe and left posterior temporal lobe. Chronic infarct of the right corona radiata. Focus of susceptibility in the left temporal white matter is most compatible with chronic microhemorrhage. Ventricles and sulci are within normal limits in size and configuration. There is no intracranial mass, mass effect, hydrocephalus, or extra-axial collection. Vascular: Major vessel flow voids at the skull base are preserved. Skull and upper cervical spine: Marrow signal is within normal limits. Sinuses/Orbits: Minor paranasal sinus mucosal thickening. Orbits are unremarkable. Other: Sella is unremarkable.  Mastoid air cells are aerated. MRA HEAD FINDINGS Intracranial internal carotid arteries are patent. Middle and anterior cerebral arteries are patent. Included intracranial vertebral arteries, basilar artery, posterior cerebral arteries are patent. Given above findings on MRA neck, the visualized patent intracranial right vertebral artery may reflect retrograde flow to the PICA origin. Right posterior communicating artery is present. Possible left posterior communicating artery is well. There is no significant stenosis or aneurysm. MRA NECK FINDINGS Motion artifact is present. Common, internal, and external carotid arteries are patent. No hemodynamically significant stenosis is identified. Extracranial left vertebral artery is patent  without stenosis. There is no definite flow related enhancement within the extracranial right vertebral artery. IMPRESSION: Degraded by motion artifact. Acute small infarcts along the roof of the right temporal horn and head of the right hippocampus. Acute or subacute punctate cortical right occipital and left temporal infarcts. Possible punctate subacute infarct of posterior limb of right internal capsule. Chronic infarct right corona radiata. 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 Intracranially, suspect retrograde flow to the PICA origin. Otherwise patent anterior and posterior circulations without stenosis. Electronically Signed   By: Macy Mis M.D.   On: 05/25/2020 16:26   CT HEAD CODE STROKE WO CONTRAST  Result Date: 05/25/2020 CLINICAL DATA:  Code stroke. EXAM: CT HEAD WITHOUT CONTRAST TECHNIQUE: Contiguous axial images were obtained from the base of the skull through the vertex without intravenous contrast. COMPARISON:  None. FINDINGS: Brain: There is no acute intracranial hemorrhage, mass effect, or edema. Gray-white differentiation is preserved. 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 Ventricles and sulci are normal in size and configuration. No extra-axial collection. Vascular: No hyperdense vessel. There is intracranial atherosclerotic calcification at the skull base. Skull: Unremarkable. Sinuses/Orbits: No acute abnormality. Other: Mastoid air cells are clear. ASPECTS ([place] Stroke Program Early CT Score) - Ganglionic level infarction (caudate, lentiform nuclei, internal capsule, insula, M1-M3 cortex): 7 - Supraganglionic infarction (M4-M6 cortex): 3 Total score (0-10 with 10 being normal): 10 IMPRESSION: There is no acute intracranial hemorrhage or evidence of acute infarction. ASPECT score is 10. 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 These results were communicated to Dr. Leonie Man at 1:52 pm on  05/25/2020 by text page via the [facility] messaging system. Electronically Signed   By: Macy Mis M.D.   On: 05/25/2020 13:54     Sharion Settler,  DO 05/26/2020, 6:38 AM PGY-1, [place] Intern pager: [phone], text pages welcome

## 2020-05-26 NOTE — Progress Notes (Signed)
SLP Cancellation Note  Patient Details Name: Jenesa Foresta MRN: [id number] DOB: [birth year]   Cancelled treatment:       Reason Eval/Treat Not Completed: Other (comment). Pt passed [place], started on diet. Likely does not need SLP swallow eval. Will f/u for speech/language as ordered.    Najwa Spillane, Katherene Ponto 05/26/2020, 8:22 AM

## 2020-05-26 NOTE — Progress Notes (Signed)
Inpatient Rehab Admissions Coordinator Note:   Per therapy recommendations, pt was screened for CIR candidacy by Shann Medal, PT, DPT.  At this time we are recommending a CIR consult and I will request an order per our protocol.  Please contact me with questions.   Shann Medal, PT, DPT [phone] 05/26/20 3:26 PM

## 2020-05-27 ENCOUNTER — Inpatient Hospital Stay (HOSPITAL_COMMUNITY): Payer: Medicare Other

## 2020-05-27 ENCOUNTER — Other Ambulatory Visit: Payer: Self-pay | Admitting: Medical

## 2020-05-27 DIAGNOSIS — I6340 Cerebral infarction due to embolism of unspecified cerebral artery: Secondary | ICD-10-CM | POA: Diagnosis not present

## 2020-05-27 DIAGNOSIS — I639 Cerebral infarction, unspecified: Secondary | ICD-10-CM | POA: Diagnosis not present

## 2020-05-27 DIAGNOSIS — R059 Cough, unspecified: Secondary | ICD-10-CM | POA: Diagnosis not present

## 2020-05-27 DIAGNOSIS — F17210 Nicotine dependence, cigarettes, uncomplicated: Secondary | ICD-10-CM | POA: Diagnosis not present

## 2020-05-27 MED ORDER — AMLODIPINE BESYLATE 5 MG PO TABS
5.0000 mg | ORAL_TABLET | Freq: Every day | ORAL | Status: DC
  Administered 2020-05-27 – 2020-05-28 (×2): 5 mg via ORAL
  Filled 2020-05-27: qty 1

## 2020-05-27 MED ORDER — FLUTICASONE PROPIONATE 50 MCG/ACT NA SUSP
1.0000 | Freq: Every day | NASAL | Status: DC
  Administered 2020-05-27 – 2020-05-31 (×5): 1 via NASAL
  Filled 2020-05-27: qty 16

## 2020-05-27 MED ORDER — POLYETHYLENE GLYCOL 3350 17 G PO PACK
17.0000 g | PACK | Freq: Every day | ORAL | Status: DC
  Administered 2020-05-27 – 2020-05-30 (×4): 17 g via ORAL
  Filled 2020-05-27 (×4): qty 1

## 2020-05-27 MED ORDER — ALBUTEROL SULFATE HFA 108 (90 BASE) MCG/ACT IN AERS
2.0000 | INHALATION_SPRAY | RESPIRATORY_TRACT | Status: DC | PRN
  Filled 2020-05-27: qty 6.7

## 2020-05-27 NOTE — Progress Notes (Addendum)
Family Medicine Teaching Service Daily Progress Note Intern Pager: [phone]  Patient name: Donna [name] Medical record number: [id number] Date of birth: 08/29/1950 Age: 70 y.o. Gender: female  Primary Care Provider: Care, Jinny Blossom [facility] Consultants: Neurology Code Status: FULL  Pt Overview and Major Events to Date:  5/11: Code stroke activated, Admitted  Assessment and Plan: Donna [name] a 70 y.o.femalepresenting with L sided. PMH is significant for hypertension, hyperlipidemia, depression session and anxiety, tobacco use.  Acute CVA with residual left-sided weakness Will need to be on DAPT with Aspirin and Plavix x3 months followed by aspirin alone, per neuro. Neurology also recommending 30-day heart monitor to assess for paroxysmal atrial fibrillation.  Patient has been recommended for CIR given her residual deficits.  -Neurology following, appreciate care and recommendations -Dual antiplatelet therapy with aspirin and Plavix x3 weeks, followed by aspirin alone -Continue high intensity statin   -PT/OT: CIR  HTN: Chronic, stable BP's ranging 143-164/66-71. Most recent 164/66. Apparently has not taken her anti-hypertensive medications for the last 6 months, was prescribed amlodipine-benazepril 10-20 mg. -Will start Amlodipine 5 mg   HLD: Chronic LDL goal <70.  Lipid panel significant for elevated triglycerides 192, LDL 83, cholesterol 168. -Continue rosuvastatin 20 mg daily  Anxiety  Depression  Grief Chaplain visited with the patient yesterday. Her home medications also restarted yesterday. She will benefit from continued management post-hospitalization as depression can be worsened after stroke.  -Restart home Cymbalta 60 mg daily -Restart BuSpar 5 mg twice daily -Trazodone 50 mg daily at bedtime   Tobacco Abuse  Cough, ?COPD Smokes 1/2 PPD. Intermittently coughing in room. CXR performed to evaluate for potential aspiration but negative.   -Offer Nicotine patch inpatient -Smoking cessation -Albuterol 2 puffs PRN wheezing, shortness of breath -Flonase daily   -Will reconsult SLP   Constipation Reports last BM 3 days ago. Typically has 2 bowel movements daily. Requests laxative. -MiraLax 17 g daily   FEN/GI: Heart healthy/carb modified PPx: Lovenox   Status is: Inpatient  Remains inpatient appropriate because:Unsafe d/c plan   Dispo: The patient is from: Home              Anticipated d/c is to: CIR              Patient currently is medically stable to d/c.   Difficult to place patient No   Subjective:  Patient is struggling to ask for assistance when needed.  States that she is not used to asking for help and typically does things independently.  She is thankful for the help that she has been receiving here.  She is amenable to going to rehab to work on strength for her left side.  Occasionally feels that she is having increased anxiety.  Objective: Temp:  [98 F (36.7 C)-99.1 F (37.3 C)] 98 F (36.7 C) (05/13 0304) Pulse Rate:  [68-82] 68 (05/13 0304) Resp:  [17-21] 17 (05/13 0304) BP: (143-164)/(66-71) 164/66 (05/13 0304) SpO2:  [96 %-100 %] 100 % (05/13 0304) Physical Exam: General: Awake, alert, mildly slurred speech, left-sided facial droop Cardiovascular: RRR, no murmur Respiratory: Breathing comfortably on room air, anterior lung fields clear, intermittently coughing Abdomen: Soft, nondistended, mild tenderness to palpation periumbilically without rebound or guarding Extremities: No edema, 2+ DP and radial pulses bilaterally Neuro: left-sided facial droop, 2/5 grip strength left hand, 5/5 in right hand. Unable to lift lower leg against gravity. Speech is slurred.   Laboratory: Recent Labs  Lab 05/25/20 1340 05/25/20 1349 05/26/20 0319  WBC  11.4*  --  10.6*  HGB 15.3* 15.0 14.4  HCT 46.2* 44.0 43.1  PLT 261  --  247   Recent Labs  Lab 05/25/20 1340 05/25/20 1349 05/26/20 0319  NA 137  140 138  K 4.9 4.6 3.7  CL 109 110 110  CO2 24  --  21*  BUN 20 22 17   CREATININE 1.00 0.90 0.83  CALCIUM 9.1  --  9.1  PROT 6.2*  --   --   BILITOT 0.3  --   --   ALKPHOS 63  --   --   ALT 17  --   --   AST 22  --   --   GLUCOSE 97 88 89    Imaging/Diagnostic Tests: ECHOCARDIOGRAM COMPLETE  Result Date: 05/26/2020    ECHOCARDIOGRAM REPORT   Patient Name:   Donna [name] Date of Exam: 05/26/2020 Medical Rec #:  [id number]       Height:       69.0 in Accession #:    [id number]      Weight:       189.0 lb Date of Birth:  10/08/1950      BSA:          2.017 m Patient Age:    70 years        BP:           136/72 mmHg Patient Gender: F               HR:           72 bpm. Exam Location:  Inpatient Procedure: 2D Echo, Cardiac Doppler and Color Doppler Indications:    Stroke  History:        Patient has no prior history of Echocardiogram examinations.                 Risk Factors:Hypertension and Dyslipidemia.  Sonographer:    Bernadene Person RDCS Referring Phys: [id number] [place]  1. Left ventricular ejection fraction, by estimation, is 65 to 70%. The left ventricle has normal function. The left ventricle has no regional wall motion abnormalities. Left ventricular diastolic parameters were normal.  2. Right ventricular systolic function is normal. The right ventricular size is normal. There is normal pulmonary artery systolic pressure.  3. The mitral valve is normal in structure. No evidence of mitral valve regurgitation. No evidence of mitral stenosis.  4. The aortic valve is normal in structure. Aortic valve regurgitation is not visualized. No aortic stenosis is present.  5. Aortic dilatation noted. There is mild dilatation of the ascending aorta, measuring 42 mm. FINDINGS  Left Ventricle: Left ventricular ejection fraction, by estimation, is 65 to 70%. The left ventricle has normal function. The left ventricle has no regional wall motion abnormalities. The left ventricular internal  cavity size was normal in size. There is  no left ventricular hypertrophy. Left ventricular diastolic parameters were normal. Right Ventricle: The right ventricular size is normal. No increase in right ventricular wall thickness. Right ventricular systolic function is normal. There is normal pulmonary artery systolic pressure. The tricuspid regurgitant velocity is 2.24 m/s, and  with an assumed right atrial pressure of 3 mmHg, the estimated right ventricular systolic pressure is 64.4 mmHg. Left Atrium: Left atrial size was normal in size. Right Atrium: Right atrial size was normal in size. Pericardium: There is no evidence of pericardial effusion. Mitral Valve: The mitral valve is normal in structure. No evidence of mitral valve regurgitation. No  evidence of mitral valve stenosis. Tricuspid Valve: The tricuspid valve is grossly normal. Tricuspid valve regurgitation is trivial. Aortic Valve: The aortic valve is normal in structure. Aortic valve regurgitation is not visualized. No aortic stenosis is present. Pulmonic Valve: The pulmonic valve was grossly normal. Pulmonic valve regurgitation is not visualized. Aorta: Aortic dilatation noted. There is mild dilatation of the ascending aorta, measuring 42 mm. IAS/Shunts: The atrial septum is grossly normal.  LEFT VENTRICLE PLAX 2D LVIDd:         4.20 cm  Diastology LVIDs:         2.60 cm  LV e' medial:    7.54 cm/s LV PW:         1.00 cm  LV E/e' medial:  6.6 LV IVS:        1.10 cm  LV e' lateral:   9.41 cm/s LVOT diam:     2.10 cm  LV E/e' lateral: 5.3 LV SV:         65 LV SV Index:   32 LVOT Area:     3.46 cm  RIGHT VENTRICLE RV S prime:     14.30 cm/s TAPSE (M-mode): 2.2 cm LEFT ATRIUM             Index       RIGHT ATRIUM           Index LA diam:        2.60 cm 1.29 cm/m  RA Area:     12.80 cm LA Vol (A2C):   33.8 ml 16.76 ml/m RA Volume:   26.90 ml  13.34 ml/m LA Vol (A4C):   28.5 ml 14.13 ml/m LA Biplane Vol: 31.5 ml 15.62 ml/m  AORTIC VALVE LVOT Vmax:   90.10  cm/s LVOT Vmean:  62.100 cm/s LVOT VTI:    0.189 m  AORTA Ao Root diam: 3.50 cm Ao Asc diam:  4.20 cm MITRAL VALVE               TRICUSPID VALVE MV Area (PHT): 1.70 cm    TR Peak grad:   20.1 mmHg MV Decel Time: 447 msec    TR Vmax:        224.00 cm/s MV E velocity: 49.70 cm/s MV A velocity: 57.60 cm/s  SHUNTS MV E/A ratio:  0.86        Systemic VTI:  0.19 m                            Systemic Diam: 2.10 cm Mertie Moores MD Electronically signed by Mertie Moores MD Signature Date/Time: 05/26/2020/10:55:33 AM    Final    VAS US CAROTID  Result Date: 05/26/2020 Carotid Arterial Duplex Study Patient Name:  YURITZA PAULHUS  Date of Exam:   05/26/2020 Medical Rec #: [id number]        Accession #:    [id number] Date of Birth: 1950-06-26       Patient Gender: F Patient Age:   69Y Exam Location:  [facility] Procedure:      VAS US CAROTID Referring Phys: [id number] Seth Ward --------------------------------------------------------------------------------  Indications:  CVA. Risk Factors: Hypertension, hyperlipidemia. Performing Technologist: Abram Sander RVS  Examination Guidelines: A complete evaluation includes B-mode imaging, spectral Doppler, color Doppler, and power Doppler as needed of all accessible portions of each vessel. Bilateral testing is considered an integral part of a complete examination. Limited examinations for reoccurring indications may be performed as noted.  Right  Carotid Findings: +----------+--------+--------+--------+------------------+--------+           PSV cm/sEDV cm/sStenosisPlaque DescriptionComments +----------+--------+--------+--------+------------------+--------+ CCA Prox  48      9               heterogenous               +----------+--------+--------+--------+------------------+--------+ CCA Distal47      14              heterogenous               +----------+--------+--------+--------+------------------+--------+ ICA Prox  57      13      1-39%    heterogenous      tortuous +----------+--------+--------+--------+------------------+--------+ ICA Distal32      9                                          +----------+--------+--------+--------+------------------+--------+ ECA       58      9                                 tortuous +----------+--------+--------+--------+------------------+--------+ +----------+--------+-------+--------+-------------------+           PSV cm/sEDV cmsDescribeArm Pressure (mmHG) +----------+--------+-------+--------+-------------------+ [id number]                                        +----------+--------+-------+--------+-------------------+ +---------+--------+--------+--------------+ VertebralPSV cm/sEDV cm/sNot identified +---------+--------+--------+--------------+  Left Carotid Findings: +----------+--------+--------+--------+------------------+--------+           PSV cm/sEDV cm/sStenosisPlaque DescriptionComments +----------+--------+--------+--------+------------------+--------+ CCA Prox  65      10              heterogenous               +----------+--------+--------+--------+------------------+--------+ CCA Distal64      13              heterogenous               +----------+--------+--------+--------+------------------+--------+ ICA Prox  106     29      1-39%   heterogenous               +----------+--------+--------+--------+------------------+--------+ ICA Distal68      24                                         +----------+--------+--------+--------+------------------+--------+ ECA       81      12                                tortuous +----------+--------+--------+--------+------------------+--------+ +----------+--------+--------+--------+-------------------+           PSV cm/sEDV cm/sDescribeArm Pressure (mmHG) +----------+--------+--------+--------+-------------------+ [id number]                                          +----------+--------+--------+--------+-------------------+ +---------+--------+--+--------+--+---------+ VertebralPSV cm/s58EDV cm/s11Antegrade +---------+--------+--+--------+--+---------+   Summary: Right Carotid: Velocities in the right ICA are consistent with a 1-39% stenosis. Left Carotid: Velocities in the left ICA are consistent with  a 1-39% stenosis. Vertebrals: Right vertebral artery was not visualized. *See table(s) above for measurements and observations.  Electronically signed by Antony Contras MD on 05/26/2020 at 11:59:18 AM.    Final      Sharion Settler, DO 05/27/2020, 7:34 AM PGY-1, [place] Intern pager: [phone], text pages welcome

## 2020-05-27 NOTE — Consult Note (Signed)
Physical Medicine and Rehabilitation Consult Reason for Consult: Left side weakness Referring Physician: Dr. Madison Hickman   HPI: Donna [name] is a 70 y.o. right-handed female with history of anxiety/depression, hyperlipidemia, hypertension and tobacco abuse.  Per chart review patient lives alone.  Reportedly independent prior to admission and working as a Training and development officer.  Presented 05/25/2020 with acute onset of left-sided weakness.  Cranial CT scan no acute change.  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  Patient did not receive tPA.  MRI/MRA showed acute small infarct along the roof of the right temporal horn and head of the right hippocampus.  Acute or subacute punctate cortical right occipital and left temporal infarcts.  Possible punctate subacute infarct of posterior limb of right internal capsule.  Chronic infarct right corona radiata.  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  Admission chemistries unremarkable, WBC 11,400.  Echocardiogram with ejection fraction of 65 to 70% no wall motion abnormalities.  Currently maintained on aspirin 81 mg daily and Plavix 75 mg daily for CVA prophylaxis.  Subcutaneous Lovenox for DVT prophylaxis.  Tolerating a regular consistency diet.  Therapy evaluations completed due to patient's left-sided weakness recommendations of physical medicine rehab consult.   Review of Systems  Constitutional: Negative for chills and fever.  HENT: Negative for hearing loss.   Eyes: Negative for blurred vision and double vision.  Respiratory: Negative for cough and shortness of breath.   Cardiovascular: Negative for chest pain and palpitations.  Gastrointestinal: Positive for constipation. Negative for heartburn, nausea and vomiting.  Genitourinary: Negative for dysuria, flank pain and hematuria.  Musculoskeletal: Positive for myalgias.  Skin: Negative for rash.  Neurological: Positive for weakness.   Psychiatric/Behavioral: Positive for depression.       Anxiety  All other systems reviewed and are negative.  Past Medical History:  Diagnosis Date  . Allergy   . Anxiety   . Depression   . Hyperlipidemia    no meds   . Hypertension    Past Surgical History:  Procedure Laterality Date  . DENTAL SURGERY    . NSVD     x2  . TUBAL LIGATION  1976   Family History  Problem Relation Age of Onset  . Colon cancer Mother        dx'd in her 96's   . Colon polyps Sister   . Esophageal cancer Sister   . Lung cancer Sister   . Breast cancer Sister   . Rectal cancer Neg Hx   . Stomach cancer Neg Hx   . Pancreatic cancer Neg Hx    Social History:  reports that she has been smoking. She has never used smokeless tobacco. She reports that she does not drink alcohol and does not use drugs. Allergies: No Known Allergies Facility-Administered Medications Prior to Admission  Medication Dose Route Frequency Provider Last Rate Last Admin  . 0.9 %  sodium chloride infusion  500 mL Intravenous Once Pyrtle, Lajuan Lines, MD       Medications Prior to Admission  Medication Sig Dispense Refill  . amLODipine-benazepril (LOTREL) 10-20 MG capsule Take 1 capsule by mouth daily.    . busPIRone (BUSPAR) 5 MG tablet Take 5 mg by mouth 2 (two) times daily.  0  . DULoxetine (CYMBALTA) 60 MG capsule Take 60 mg by mouth daily.    . fluticasone (FLONASE) 50 MCG/ACT nasal spray Place 1 spray into both nostrils daily.    . montelukast (SINGULAIR) 10 MG tablet  Take 10 mg by mouth at bedtime.  0  . traZODone (DESYREL) 50 MG tablet Take 50 mg by mouth at bedtime.  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 Functional Status:  Mobility: Bed  Mobility Overal bed mobility: Needs Assistance Bed Mobility: Supine to Sit,Sit to Supine Supine to sit: Min assist,HOB elevated Sit to supine: Min assist,HOB elevated General bed mobility comments: Min A for L LE and to stabilize Transfers Overall transfer level: Needs assistance Equipment used: 1 person hand held assist Transfers: Sit to/from [employer] Sit to Stand: Min assist Stand pivot transfers: Max assist,Mod assist General transfer comment: Min A to stand from bed and mod A to pivot to [facility] toward L side. After using BSC, pt began to stand before PT had gait belt and lost balance requiring max A for pivot back to bed.  Pt stating she wanted to try on her own once she had sat down. Ambulation/Gait Ambulation/Gait assistance: Mod assist Gait Distance (Feet): 3 Feet Assistive device: Rolling walker (2 wheeled) Gait Pattern/deviations: Step-to pattern,Decreased stride length,Decreased weight shift to left,Decreased stance time - left,Decreased dorsiflexion - left General Gait Details: Limited distance due to pt lethargic.  She ambulated earlier around bed with OT.  Recommend A of 2 to progress further Gait velocity: decreased    ADL: ADL Overall ADL's : Needs assistance/impaired Eating/Feeding: Minimal assistance,Sitting Eating/Feeding Details (indicate cue type and reason): Min A for bilateral coorindation Grooming: Minimal assistance,Sitting Upper Body Bathing: Moderate assistance,Sitting Lower Body Bathing: Maximal assistance,Sit to/from stand Upper Body Dressing : Moderate assistance,Sitting Lower Body Dressing: Maximal assistance,Sit to/from stand Toilet Transfer: Moderate assistance,Ambulation,RW (simualted to recliner) Functional mobility during ADLs: Moderate assistance,Rolling walker General ADL Comments: Pt presenting with decreased functuional use of LUE, balance, strength, vision, cognition, and actiity tolerance  Cognition: Cognition Overall  Cognitive Status: Impaired/Different from baseline Orientation Level: Oriented X4 Cognition Arousal/Alertness: Lethargic Behavior During Therapy: Anxious,Impulsive Overall Cognitive Status: Impaired/Different from baseline Area of Impairment: Attention,Problem solving,Following commands,Awareness,Safety/judgement Current Attention Level: Sustained Following Commands: Follows one step commands inconsistently,Follows multi-step commands inconsistently Safety/Judgement: Decreased awareness of safety,Decreased awareness of deficits Awareness: Intellectual Problem Solving: Slow processing,Requires verbal cues,Difficulty sequencing General Comments: Pt emotional at times -stating "am I always going to be like this? I don't want others to have to take care of me"; She was lethargic at time of PT eval (saw OT earlier and recently returned to bed).  Pt also somewhat impulsive in starting transfers - stated "I wanted to try on my own", "I want to get better too quick"  Blood pressure (!) 164/66, pulse 68, temperature 98 F (36.7 C), temperature source Oral, resp. rate 17, SpO2 100 %. Physical Exam Constitutional:      Appearance: She is obese.     Comments: lethargic  HENT:     Right Ear: External ear normal.     Left Ear: External ear normal.     Nose: Nose normal.  Cardiovascular:     Rate and Rhythm: Normal rate.     Pulses: Normal pulses.  Pulmonary:     Effort: Pulmonary effort is normal.  Abdominal:     Palpations: Abdomen is soft.  Skin:    General: Skin is warm.  Neurological:     Comments:  Makes eye contact with examiner when cued.  Oriented to person place and time with cues and extra time.  Follows commands.  Fair insight and awareness. Left central 7, speech dysartrhic.  Left inattention. LUE 2-3/5 LLE 1/5. RUE and RLE 4-5/5. Decreased LT left arm and leg. DTR's 2+ right 1+ left. Toes down on left  Psychiatric:     Comments: flat     No results found for this or any  previous visit (from the past 24 hour(s)). MR ANGIO HEAD WO CONTRAST  Result Date: 05/25/2020 CLINICAL DATA:  Left facial droop and left-sided weakness EXAM: MRI HEAD WITHOUT CONTRAST MRA HEAD WITHOUT CONTRAST MRA NECK WITHOUT CONTRAST TECHNIQUE: Multiplanar, multiecho pulse sequences of the brain and surrounding structures were obtained without intravenous contrast. Angiographic images of the Circle of Willis were obtained using MRA technique without intravenous contrast. Angiographic images of the neck were obtained using MRA technique without intravenous contrast. Carotid stenosis measurements (when applicable) are obtained utilizing NASCET criteria, using the distal internal carotid diameter as the denominator. COMPARISON:  None. FINDINGS: MRI HEAD FINDINGS Motion artifact is present. Brain: Diffusion hyperintensity with ADC isointensity is present in the region of the posterior limb of the right internal capsule on the axial sequence. However, this is not clearly present on the coronal sequence. There is reduced diffusion in both planes along the roof the right temporal horn and along the medial temporal lobe involving the hippocampus. Punctate cortical foci of diffusion hyperintensity are present in the right occipital lobe and left posterior temporal lobe. Chronic infarct of the right corona radiata. Focus of susceptibility in the left temporal white matter is most compatible with chronic microhemorrhage. Ventricles and sulci are within normal limits in size and configuration. There is no intracranial mass, mass effect, hydrocephalus, or extra-axial collection. Vascular: Major vessel flow voids at the skull base are preserved. Skull and upper cervical spine: Marrow signal is within normal limits. Sinuses/Orbits: Minor paranasal sinus mucosal thickening. Orbits are unremarkable. Other: Sella is unremarkable.  Mastoid air cells are aerated. MRA HEAD FINDINGS Intracranial internal carotid arteries are patent.  Middle and anterior cerebral arteries are patent. Included intracranial vertebral arteries, basilar artery, posterior cerebral arteries are patent. Given above findings on MRA neck, the visualized patent intracranial right vertebral artery may reflect retrograde flow to the PICA origin. Right posterior communicating artery is present. Possible left posterior communicating artery is well. There is no significant stenosis or aneurysm. MRA NECK FINDINGS Motion artifact is present. Common, internal, and external carotid arteries are patent. No hemodynamically significant stenosis is identified. Extracranial left vertebral artery is patent without stenosis. There is no definite flow related enhancement within the extracranial right vertebral artery. IMPRESSION: Degraded by motion artifact. Acute small infarcts along the roof of the right temporal horn and head of the right hippocampus. Acute or subacute punctate cortical right occipital and left temporal infarcts. Possible punctate subacute infarct of posterior limb of right internal capsule. Chronic infarct right corona radiata. 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 Intracranially, suspect retrograde flow to the PICA origin. Otherwise patent anterior and posterior circulations without stenosis. Electronically Signed   By: Macy Mis M.D.   On: 05/25/2020 16:26   MR ANGIO NECK WO CONTRAST  Result Date: 05/25/2020 CLINICAL DATA:  Left facial droop and left-sided weakness EXAM: MRI HEAD WITHOUT CONTRAST MRA HEAD WITHOUT CONTRAST MRA NECK WITHOUT CONTRAST TECHNIQUE: Multiplanar, multiecho pulse sequences of the brain and surrounding structures were obtained without intravenous contrast. Angiographic  images of the Circle of Willis were obtained using MRA technique without intravenous contrast. Angiographic images of the neck were obtained using MRA technique without intravenous contrast. Carotid stenosis measurements (when  applicable) are obtained utilizing NASCET criteria, using the distal internal carotid diameter as the denominator. COMPARISON:  None. FINDINGS: MRI HEAD FINDINGS Motion artifact is present. Brain: Diffusion hyperintensity with ADC isointensity is present in the region of the posterior limb of the right internal capsule on the axial sequence. However, this is not clearly present on the coronal sequence. There is reduced diffusion in both planes along the roof the right temporal horn and along the medial temporal lobe involving the hippocampus. Punctate cortical foci of diffusion hyperintensity are present in the right occipital lobe and left posterior temporal lobe. Chronic infarct of the right corona radiata. Focus of susceptibility in the left temporal white matter is most compatible with chronic microhemorrhage. Ventricles and sulci are within normal limits in size and configuration. There is no intracranial mass, mass effect, hydrocephalus, or extra-axial collection. Vascular: Major vessel flow voids at the skull base are preserved. Skull and upper cervical spine: Marrow signal is within normal limits. Sinuses/Orbits: Minor paranasal sinus mucosal thickening. Orbits are unremarkable. Other: Sella is unremarkable.  Mastoid air cells are aerated. MRA HEAD FINDINGS Intracranial internal carotid arteries are patent. Middle and anterior cerebral arteries are patent. Included intracranial vertebral arteries, basilar artery, posterior cerebral arteries are patent. Given above findings on MRA neck, the visualized patent intracranial right vertebral artery may reflect retrograde flow to the PICA origin. Right posterior communicating artery is present. Possible left posterior communicating artery is well. There is no significant stenosis or aneurysm. MRA NECK FINDINGS Motion artifact is present. Common, internal, and external carotid arteries are patent. No hemodynamically significant stenosis is identified. Extracranial  left vertebral artery is patent without stenosis. There is no definite flow related enhancement within the extracranial right vertebral artery. IMPRESSION: Degraded by motion artifact. Acute small infarcts along the roof of the right temporal horn and head of the right hippocampus. Acute or subacute punctate cortical right occipital and left temporal infarcts. Possible punctate subacute infarct of posterior limb of right internal capsule. Chronic infarct right corona radiata. 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 Intracranially, suspect retrograde flow to the PICA origin. Otherwise patent anterior and posterior circulations without stenosis. Electronically Signed   By: Macy Mis M.D.   On: 05/25/2020 16:26   MR BRAIN WO CONTRAST  Result Date: 05/25/2020 CLINICAL DATA:  Left facial droop and left-sided weakness EXAM: MRI HEAD WITHOUT CONTRAST MRA HEAD WITHOUT CONTRAST MRA NECK WITHOUT CONTRAST TECHNIQUE: Multiplanar, multiecho pulse sequences of the brain and surrounding structures were obtained without intravenous contrast. Angiographic images of the Circle of Willis were obtained using MRA technique without intravenous contrast. Angiographic images of the neck were obtained using MRA technique without intravenous contrast. Carotid stenosis measurements (when applicable) are obtained utilizing NASCET criteria, using the distal internal carotid diameter as the denominator. COMPARISON:  None. FINDINGS: MRI HEAD FINDINGS Motion artifact is present. Brain: Diffusion hyperintensity with ADC isointensity is present in the region of the posterior limb of the right internal capsule on the axial sequence. However, this is not clearly present on the coronal sequence. There is reduced diffusion in both planes along the roof the right temporal horn and along the medial temporal lobe involving the hippocampus. Punctate cortical foci of diffusion hyperintensity are present in the  right occipital lobe and left posterior temporal lobe.  Chronic infarct of the right corona radiata. Focus of susceptibility in the left temporal white matter is most compatible with chronic microhemorrhage. Ventricles and sulci are within normal limits in size and configuration. There is no intracranial mass, mass effect, hydrocephalus, or extra-axial collection. Vascular: Major vessel flow voids at the skull base are preserved. Skull and upper cervical spine: Marrow signal is within normal limits. Sinuses/Orbits: Minor paranasal sinus mucosal thickening. Orbits are unremarkable. Other: Sella is unremarkable.  Mastoid air cells are aerated. MRA HEAD FINDINGS Intracranial internal carotid arteries are patent. Middle and anterior cerebral arteries are patent. Included intracranial vertebral arteries, basilar artery, posterior cerebral arteries are patent. Given above findings on MRA neck, the visualized patent intracranial right vertebral artery may reflect retrograde flow to the PICA origin. Right posterior communicating artery is present. Possible left posterior communicating artery is well. There is no significant stenosis or aneurysm. MRA NECK FINDINGS Motion artifact is present. Common, internal, and external carotid arteries are patent. No hemodynamically significant stenosis is identified. Extracranial left vertebral artery is patent without stenosis. There is no definite flow related enhancement within the extracranial right vertebral artery. IMPRESSION: Degraded by motion artifact. Acute small infarcts along the roof of the right temporal horn and head of the right hippocampus. Acute or subacute punctate cortical right occipital and left temporal infarcts. Possible punctate subacute infarct of posterior limb of right internal capsule. Chronic infarct right corona radiata. 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 Intracranially, suspect retrograde flow to the PICA origin.  Otherwise patent anterior and posterior circulations without stenosis. Electronically Signed   By: Macy Mis M.D.   On: 05/25/2020 16:26   ECHOCARDIOGRAM COMPLETE  Result Date: 05/26/2020    ECHOCARDIOGRAM REPORT   Patient Name:   MARKEA HAPP Date of Exam: 05/26/2020 Medical Rec #:  [id number]       Height:       69.0 in Accession #:    [id number]      Weight:       189.0 lb Date of Birth:  Apr 14, 1950      BSA:          2.017 m Patient Age:    41 years        BP:           136/72 mmHg Patient Gender: F               HR:           72 bpm. Exam Location:  Inpatient Procedure: 2D Echo, Cardiac Doppler and Color Doppler Indications:    Stroke  History:        Patient has no prior history of Echocardiogram examinations.                 Risk Factors:Hypertension and Dyslipidemia.  Sonographer:    Bernadene Person RDCS Referring Phys: [id number] [place]  1. Left ventricular ejection fraction, by estimation, is 65 to 70%. The left ventricle has normal function. The left ventricle has no regional wall motion abnormalities. Left ventricular diastolic parameters were normal.  2. Right ventricular systolic function is normal. The right ventricular size is normal. There is normal pulmonary artery systolic pressure.  3. The mitral valve is normal in structure. No evidence of mitral valve regurgitation. No evidence of mitral stenosis.  4. The aortic valve is normal in structure. Aortic valve regurgitation is not visualized. No aortic stenosis is present.  5. Aortic dilatation noted. There is mild  dilatation of the ascending aorta, measuring 42 mm. FINDINGS  Left Ventricle: Left ventricular ejection fraction, by estimation, is 65 to 70%. The left ventricle has normal function. The left ventricle has no regional wall motion abnormalities. The left ventricular internal cavity size was normal in size. There is  no left ventricular hypertrophy. Left ventricular diastolic parameters were normal. Right  Ventricle: The right ventricular size is normal. No increase in right ventricular wall thickness. Right ventricular systolic function is normal. There is normal pulmonary artery systolic pressure. The tricuspid regurgitant velocity is 2.24 m/s, and  with an assumed right atrial pressure of 3 mmHg, the estimated right ventricular systolic pressure is 89.3 mmHg. Left Atrium: Left atrial size was normal in size. Right Atrium: Right atrial size was normal in size. Pericardium: There is no evidence of pericardial effusion. Mitral Valve: The mitral valve is normal in structure. No evidence of mitral valve regurgitation. No evidence of mitral valve stenosis. Tricuspid Valve: The tricuspid valve is grossly normal. Tricuspid valve regurgitation is trivial. Aortic Valve: The aortic valve is normal in structure. Aortic valve regurgitation is not visualized. No aortic stenosis is present. Pulmonic Valve: The pulmonic valve was grossly normal. Pulmonic valve regurgitation is not visualized. Aorta: Aortic dilatation noted. There is mild dilatation of the ascending aorta, measuring 42 mm. IAS/Shunts: The atrial septum is grossly normal.  LEFT VENTRICLE PLAX 2D LVIDd:         4.20 cm  Diastology LVIDs:         2.60 cm  LV e' medial:    7.54 cm/s LV PW:         1.00 cm  LV E/e' medial:  6.6 LV IVS:        1.10 cm  LV e' lateral:   9.41 cm/s LVOT diam:     2.10 cm  LV E/e' lateral: 5.3 LV SV:         65 LV SV Index:   32 LVOT Area:     3.46 cm  RIGHT VENTRICLE RV S prime:     14.30 cm/s TAPSE (M-mode): 2.2 cm LEFT ATRIUM             Index       RIGHT ATRIUM           Index LA diam:        2.60 cm 1.29 cm/m  RA Area:     12.80 cm LA Vol (A2C):   33.8 ml 16.76 ml/m RA Volume:   26.90 ml  13.34 ml/m LA Vol (A4C):   28.5 ml 14.13 ml/m LA Biplane Vol: 31.5 ml 15.62 ml/m  AORTIC VALVE LVOT Vmax:   90.10 cm/s LVOT Vmean:  62.100 cm/s LVOT VTI:    0.189 m  AORTA Ao Root diam: 3.50 cm Ao Asc diam:  4.20 cm MITRAL VALVE                TRICUSPID VALVE MV Area (PHT): 1.70 cm    TR Peak grad:   20.1 mmHg MV Decel Time: 447 msec    TR Vmax:        224.00 cm/s MV E velocity: 49.70 cm/s MV A velocity: 57.60 cm/s  SHUNTS MV E/A ratio:  0.86        Systemic VTI:  0.19 m                            Systemic Diam: 2.10 cm Mertie Moores  MD Electronically signed by Mertie Moores MD Signature Date/Time: 05/26/2020/10:55:33 AM    Final    CT HEAD CODE STROKE WO CONTRAST  Result Date: 05/25/2020 CLINICAL DATA:  Code stroke. EXAM: CT HEAD WITHOUT CONTRAST TECHNIQUE: Contiguous axial images were obtained from the base of the skull through the vertex without intravenous contrast. COMPARISON:  None. FINDINGS: Brain: There is no acute intracranial hemorrhage, mass effect, or edema. Gray-white differentiation is preserved. 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 Ventricles and sulci are normal in size and configuration. No extra-axial collection. Vascular: No hyperdense vessel. There is intracranial atherosclerotic calcification at the skull base. Skull: Unremarkable. Sinuses/Orbits: No acute abnormality. Other: Mastoid air cells are clear. ASPECTS ([place] Stroke Program Early CT Score) - Ganglionic level infarction (caudate, lentiform nuclei, internal capsule, insula, M1-M3 cortex): 7 - Supraganglionic infarction (M4-M6 cortex): 3 Total score (0-10 with 10 being normal): 10 IMPRESSION: There is no acute intracranial hemorrhage or evidence of acute infarction. ASPECT score is 10. 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 These results were communicated to Dr. Leonie Man at 1:52 pm on 05/25/2020 by text page via the [facility] messaging system. Electronically Signed   By: Macy Mis M.D.   On: 05/25/2020 13:54   VAS US CAROTID  Result Date: 05/26/2020 Carotid Arterial Duplex Study Patient Name:  MALESHA BENVENUTO  Date of Exam:   05/26/2020 Medical Rec #: [id number]        Accession #:    [id number] Date of Birth:  05/20/1950       Patient Gender: F Patient Age:   69Y Exam Location:  [facility] Procedure:      VAS US CAROTID Referring Phys: [id number] [place] --------------------------------------------------------------------------------  Indications:  CVA. Risk Factors: Hypertension, hyperlipidemia. Performing Technologist: Abram Sander RVS  Examination Guidelines: A complete evaluation includes B-mode imaging, spectral Doppler, color Doppler, and power Doppler as needed of all accessible portions of each vessel. Bilateral testing is considered an integral part of a complete examination. Limited examinations for reoccurring indications may be performed as noted.  Right Carotid Findings: +----------+--------+--------+--------+------------------+--------+           PSV cm/sEDV cm/sStenosisPlaque DescriptionComments +----------+--------+--------+--------+------------------+--------+ CCA Prox  48      9               heterogenous               +----------+--------+--------+--------+------------------+--------+ CCA Distal47      14              heterogenous               +----------+--------+--------+--------+------------------+--------+ ICA Prox  57      13      1-39%   heterogenous      tortuous +----------+--------+--------+--------+------------------+--------+ ICA Distal32      9                                          +----------+--------+--------+--------+------------------+--------+ ECA       58      9                                 tortuous +----------+--------+--------+--------+------------------+--------+ +----------+--------+-------+--------+-------------------+           PSV cm/sEDV cmsDescribeArm Pressure (mmHG) +----------+--------+-------+--------+-------------------+ [id number]                                        +----------+--------+-------+--------+-------------------+ +---------+--------+--------+--------------+  [place]  cm/sEDV cm/sNot identified +---------+--------+--------+--------------+  Left Carotid Findings: +----------+--------+--------+--------+------------------+--------+           PSV cm/sEDV cm/sStenosisPlaque DescriptionComments +----------+--------+--------+--------+------------------+--------+ CCA Prox  65      10              heterogenous               +----------+--------+--------+--------+------------------+--------+ CCA Distal64      13              heterogenous               +----------+--------+--------+--------+------------------+--------+ ICA Prox  106     29      1-39%   heterogenous               +----------+--------+--------+--------+------------------+--------+ ICA Distal68      24                                         +----------+--------+--------+--------+------------------+--------+ ECA       81      12                                tortuous +----------+--------+--------+--------+------------------+--------+ +----------+--------+--------+--------+-------------------+           PSV cm/sEDV cm/sDescribeArm Pressure (mmHG) +----------+--------+--------+--------+-------------------+ [id number]                                         +----------+--------+--------+--------+-------------------+ +---------+--------+--+--------+--+---------+ VertebralPSV cm/s58EDV cm/s11Antegrade +---------+--------+--+--------+--+---------+   Summary: Right Carotid: Velocities in the right ICA are consistent with a 1-39% stenosis. Left Carotid: Velocities in the left ICA are consistent with a 1-39% stenosis. Vertebrals: Right vertebral artery was not visualized. *See table(s) above for measurements and observations.  Electronically signed by Antony Contras MD on 05/26/2020 at 11:59:18 AM.    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 Does the need for close, 24  hr/day medical supervision in concert with the patient's rehab needs make it unreasonable for this patient to be served in a less intensive setting? Yes 2. Co-Morbidities requiring supervision/potential complications: HTN, depression 3. Due to bladder management, bowel management, safety, skin/wound care, disease management, medication administration, pain management and patient education, does the patient require 24 hr/day rehab nursing? Yes 4. Does the patient require coordinated care of a physician, rehab nurse, therapy disciplines of PT, OT, SLP to address physical and functional deficits in the context of the above medical diagnosis(es)? Yes Addressing deficits in the following areas: balance, endurance, locomotion, strength, transferring, bowel/bladder control, bathing, dressing, feeding, grooming, toileting, cognition, speech and psychosocial support 5. Can the patient actively participate in an intensive therapy program of at least 3 hrs of therapy per day at least 5 days per week? Yes 6. The potential for patient to make measurable gains while on inpatient rehab is excellent 7. Anticipated functional outcomes upon discharge from inpatient rehab are supervision  with PT, supervision and min assist with OT, supervision with SLP. 8. Estimated rehab length of stay to reach the above functional goals is: 15-20 days 9. Anticipated discharge destination: Home 10. Overall Rehab/Functional Prognosis: good  RECOMMENDATIONS: This patient's condition is appropriate for continued rehabilitative care in the following setting:  CIR Patient has agreed to participate in recommended program. Potentially Note that insurance prior authorization may be required for reimbursement for recommended care.  Comment: Need to establish social supports. Rehab Admissions Coordinator to follow up.  Thanks,  Meredith Staggers, MD, Mellody Drown  I have personally performed a face to face diagnostic evaluation of this  patient. Additionally, I have examined pertinent labs and radiographic images. I have reviewed and concur with the physician assistant's documentation above.    Lavon Paganini Angiulli, PA-C 05/27/2020

## 2020-05-27 NOTE — Discharge Summary (Addendum)
[facility] Discharge Summary  Patient name: Donna [name] Medical record number: [id number] Date of birth: [birth year] Age: 70 y.o. Gender: female Date of Admission: 05/25/2020  Date of Discharge: 05/31/20 Admitting Physician: Zenia Resides, MD  Primary Care Provider: Care, Jinny Blossom [facility] Consultants: Neurology   Indication for Hospitalization: Acute CVA  Discharge Diagnoses/Problem List:   Active Problems:   Stroke [facility])   Primary hypertension   Cigarette nicotine dependence without complication   Cough  Disposition: SNF  Discharge Condition: Stable  Discharge Exam:   Temp:  [97.8 F (36.6 C)-98.4 F (36.9 C)] 98 F (36.7 C) (05/17 0848) Pulse Rate:  [63-73] 63 (05/17 0848) Resp:  [16-21] 19 (05/17 0848) BP: (136-156)/(53-67) 156/60 (05/17 0848) SpO2:  [95 %-97 %] 96 % (05/17 0848) Weight:  [73.4 kg] 73.4 kg (05/17 0500)  General: alert, laying in bed, NAD Cardiovascular: RRR no murmurs Respiratory: CTAB normal WOB Abdomen: soft, non distended, non tender Extremities: warm, dry. No LE edema Neuro: A&O x4. Mild left facial droop. 5/5 strength RUE and RLE. 3/5 strength LUE, 2/5 strength LLE  Brief Hospital Course:   Donna [name] is a 70 y.o. female presenting with L sided  . PMH is significant for hypertension, hyperlipidemia, depression session and anxiety, tobacco use.   Acute CVA with residual left-sided weakness Patient presented to the ED as a code stroke.  She was outside tPA window.  Initial head CT without acute infarction.  MRI brain/MRA head and neck notable for acute small infarcts along the roof of the right temporal horn and head of the right hippocampus, acute or subacute punctate cortical right occipital and left temporal infarcts and a possible punctate subacute infarct of posterior limb of the right internal capsule. 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  Carotid ultrasound showed 1-39% stenosis in b/l carotid arteries. Patient was started on dual antiplatelet therapy with aspirin and Plavix and should remain on DAPT for 3 months, followed by aspirin alone.  Her risk stratification labs only notable for increased LDL of 83 (LDL goal less than 70).  She was started on rosuvastatin 20 mg.  Hemoglobin A1c was 5.  Allowed for permissive hypertension for the first 48 hours and then started on Amlodipine 5 mg and then increased to 10mg . Patient worked with PT/OT during hospitalization, they recommended CIR placement.  Discussed with cardiology as well for 30-day heart monitor to assess for paroxysmal atrial fibrillation.   Hypertension Permissive hypertension allowed following acute stroke for the first 48 hours.  She was started on Amlodipine 5 mg on 5/13 and increased to 10mg  prior to discharge. Monitor pressures outpatient.   Hyperlipidemia LDL increased at 83, goal is less than 70.  She was started on rosuvastatin 20 mg daily.  Anxiety  Depression  Grief Patient was emotionally distressed and still grieving the recent loss of her brother while also dealing with her acute stroke.  Chaplain was consulted during admission.  She was restarted on her home Cymbalta and BuSpar.  She would likely benefit from outpatient therapy and mood check.   Follow-up recommendations: 1. Started on rosuvastatin 20 mg.  Recheck lipid panel in 4 to 6 weeks. 2. Discharged with Cymbalta and BuSpar.  Please provide resources for therapy/counseling.  Mood check. 3. Will need dual antiplatelet therapy with aspirin and Plavix  x3 months, followed by aspirin alone. 4. Will need neurology follow-up in 6-weeks. 5. Continue to encourage tobacco cessation 6. BP follow up- discharged on Amlodipine 10 mg daily.    Significant Procedures: None    Significant Labs and Imaging:  Recent Labs  Lab 05/25/20 1340 05/25/20 1349 05/26/20 0319 05/29/20 0622  WBC 11.4*  --  10.6* 10.3  HGB 15.3* 15.0 14.4 14.5  HCT 46.2* 44.0 43.1 43.4  PLT 261  --  247 234   Recent Labs  Lab 05/25/20 1340 05/25/20 1349 05/26/20 0319  NA 137 140 138  K 4.9 4.6 3.7  CL 109 110 110  CO2 24  --  21*  GLUCOSE 97 88 89  BUN 20 22 17   CREATININE 1.00 0.90 0.83  CALCIUM 9.1  --  9.1  ALKPHOS 63  --   --   AST 22  --   --   ALT 17  --   --   ALBUMIN 3.3*  --   --     DG Chest 1 View  Result Date: 05/27/2020 CLINICAL DATA:  Cough EXAM: CHEST  1 VIEW COMPARISON:  None. FINDINGS: Heart size and mediastinal contours are within normal limits. Lungs are clear. No pleural effusion or pneumothorax is seen. Osseous structures about the chest are unremarkable. IMPRESSION: No active disease. No evidence of pneumonia or pulmonary edema. Electronically Signed   By: Franki Cabot M.D.   On: 05/27/2020 10:45   MR ANGIO HEAD WO CONTRAST  Result Date: 05/25/2020 CLINICAL DATA:  Left facial droop and left-sided weakness EXAM: MRI HEAD WITHOUT CONTRAST MRA HEAD WITHOUT CONTRAST MRA NECK WITHOUT CONTRAST TECHNIQUE: Multiplanar, multiecho pulse sequences of the brain and surrounding structures were obtained without intravenous contrast. Angiographic images of the Circle of Willis were obtained using MRA technique without intravenous contrast. Angiographic images of the neck were obtained using MRA technique without intravenous contrast. Carotid stenosis measurements (when applicable) are obtained utilizing NASCET criteria, using the distal internal carotid diameter as the denominator. COMPARISON:  None. FINDINGS: MRI HEAD FINDINGS Motion artifact is present. Brain: Diffusion hyperintensity with ADC isointensity is present in the region of the posterior limb of the right internal capsule on the axial sequence. However, this is not clearly present on the coronal sequence.  There is reduced diffusion in both planes along the roof the right temporal horn and along the medial temporal lobe involving the hippocampus. Punctate cortical foci of diffusion hyperintensity are present in the right occipital lobe and left posterior temporal lobe. Chronic infarct of the right corona radiata. Focus of susceptibility in the left temporal white matter is most compatible with chronic microhemorrhage. Ventricles and sulci are within normal limits in size and configuration. There is no intracranial mass, mass effect, hydrocephalus, or extra-axial collection. Vascular: Major vessel flow voids at the skull base are preserved. Skull and upper cervical spine: Marrow signal is within normal limits. Sinuses/Orbits: Minor paranasal sinus mucosal thickening. Orbits are unremarkable. Other: Sella is unremarkable.  Mastoid air cells are aerated. MRA HEAD FINDINGS Intracranial internal carotid arteries are patent. Middle and anterior cerebral arteries are patent. Included intracranial vertebral arteries, basilar artery, posterior cerebral arteries are patent. Given above findings on MRA neck, the visualized patent intracranial right vertebral artery may reflect retrograde flow to the PICA origin. Right posterior communicating artery is present. Possible left posterior communicating artery is well. There is no significant stenosis or aneurysm. MRA NECK FINDINGS Motion artifact is present. Common, internal, and external carotid arteries are  patent. No hemodynamically significant stenosis is identified. Extracranial left vertebral artery is patent without stenosis. There is no definite flow related enhancement within the extracranial right vertebral artery. IMPRESSION: Degraded by motion artifact. Acute small infarcts along the roof of the right temporal horn and head of the right hippocampus. Acute or subacute punctate cortical right occipital and left temporal infarcts. Possible punctate subacute infarct of  posterior limb of right internal capsule. Chronic infarct right corona radiata. 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 Intracranially, suspect retrograde flow to the PICA origin. Otherwise patent anterior and posterior circulations without stenosis. Electronically Signed   By: Macy Mis M.D.   On: 05/25/2020 16:26   MR ANGIO NECK WO CONTRAST  Result Date: 05/25/2020 CLINICAL DATA:  Left facial droop and left-sided weakness EXAM: MRI HEAD WITHOUT CONTRAST MRA HEAD WITHOUT CONTRAST MRA NECK WITHOUT CONTRAST TECHNIQUE: Multiplanar, multiecho pulse sequences of the brain and surrounding structures were obtained without intravenous contrast. Angiographic images of the Circle of Willis were obtained using MRA technique without intravenous contrast. Angiographic images of the neck were obtained using MRA technique without intravenous contrast. Carotid stenosis measurements (when applicable) are obtained utilizing NASCET criteria, using the distal internal carotid diameter as the denominator. COMPARISON:  None. FINDINGS: MRI HEAD FINDINGS Motion artifact is present. Brain: Diffusion hyperintensity with ADC isointensity is present in the region of the posterior limb of the right internal capsule on the axial sequence. However, this is not clearly present on the coronal sequence. There is reduced diffusion in both planes along the roof the right temporal horn and along the medial temporal lobe involving the hippocampus. Punctate cortical foci of diffusion hyperintensity are present in the right occipital lobe and left posterior temporal lobe. Chronic infarct of the right corona radiata. Focus of susceptibility in the left temporal white matter is most compatible with chronic microhemorrhage. Ventricles and sulci are within normal limits in size and configuration. There is no intracranial mass, mass effect, hydrocephalus, or extra-axial collection. Vascular: Major vessel flow  voids at the skull base are preserved. Skull and upper cervical spine: Marrow signal is within normal limits. Sinuses/Orbits: Minor paranasal sinus mucosal thickening. Orbits are unremarkable. Other: Sella is unremarkable.  Mastoid air cells are aerated. MRA HEAD FINDINGS Intracranial internal carotid arteries are patent. Middle and anterior cerebral arteries are patent. Included intracranial vertebral arteries, basilar artery, posterior cerebral arteries are patent. Given above findings on MRA neck, the visualized patent intracranial right vertebral artery may reflect retrograde flow to the PICA origin. Right posterior communicating artery is present. Possible left posterior communicating artery is well. There is no significant stenosis or aneurysm. MRA NECK FINDINGS Motion artifact is present. Common, internal, and external carotid arteries are patent. No hemodynamically significant stenosis is identified. Extracranial left vertebral artery is patent without stenosis. There is no definite flow related enhancement within the extracranial right vertebral artery. IMPRESSION: Degraded by motion artifact. Acute small infarcts along the roof of the right temporal horn and head of the right hippocampus. Acute or subacute punctate cortical right occipital and left temporal infarcts. Possible punctate subacute infarct of posterior limb of right internal capsule. Chronic infarct right corona radiata. 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 Intracranially, suspect retrograde flow to the PICA origin. Otherwise patent anterior and posterior circulations without stenosis. Electronically Signed   By: Macy Mis M.D.   On: 05/25/2020 16:26   MR BRAIN WO CONTRAST  Result Date: 05/25/2020 CLINICAL DATA:  Left facial  droop and left-sided weakness EXAM: MRI HEAD WITHOUT CONTRAST MRA HEAD WITHOUT CONTRAST MRA NECK WITHOUT CONTRAST TECHNIQUE: Multiplanar, multiecho pulse sequences of the  brain and surrounding structures were obtained without intravenous contrast. Angiographic images of the Circle of Willis were obtained using MRA technique without intravenous contrast. Angiographic images of the neck were obtained using MRA technique without intravenous contrast. Carotid stenosis measurements (when applicable) are obtained utilizing NASCET criteria, using the distal internal carotid diameter as the denominator. COMPARISON:  None. FINDINGS: MRI HEAD FINDINGS Motion artifact is present. Brain: Diffusion hyperintensity with ADC isointensity is present in the region of the posterior limb of the right internal capsule on the axial sequence. However, this is not clearly present on the coronal sequence. There is reduced diffusion in both planes along the roof the right temporal horn and along the medial temporal lobe involving the hippocampus. Punctate cortical foci of diffusion hyperintensity are present in the right occipital lobe and left posterior temporal lobe. Chronic infarct of the right corona radiata. Focus of susceptibility in the left temporal white matter is most compatible with chronic microhemorrhage. Ventricles and sulci are within normal limits in size and configuration. There is no intracranial mass, mass effect, hydrocephalus, or extra-axial collection. Vascular: Major vessel flow voids at the skull base are preserved. Skull and upper cervical spine: Marrow signal is within normal limits. Sinuses/Orbits: Minor paranasal sinus mucosal thickening. Orbits are unremarkable. Other: Sella is unremarkable.  Mastoid air cells are aerated. MRA HEAD FINDINGS Intracranial internal carotid arteries are patent. Middle and anterior cerebral arteries are patent. Included intracranial vertebral arteries, basilar artery, posterior cerebral arteries are patent. Given above findings on MRA neck, the visualized patent intracranial right vertebral artery may reflect retrograde flow to the PICA origin. Right  posterior communicating artery is present. Possible left posterior communicating artery is well. There is no significant stenosis or aneurysm. MRA NECK FINDINGS Motion artifact is present. Common, internal, and external carotid arteries are patent. No hemodynamically significant stenosis is identified. Extracranial left vertebral artery is patent without stenosis. There is no definite flow related enhancement within the extracranial right vertebral artery. IMPRESSION: Degraded by motion artifact. Acute small infarcts along the roof of the right temporal horn and head of the right hippocampus. Acute or subacute punctate cortical right occipital and left temporal infarcts. Possible punctate subacute infarct of posterior limb of right internal capsule. Chronic infarct right corona radiata. 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 Intracranially, suspect retrograde flow to the PICA origin. Otherwise patent anterior and posterior circulations without stenosis. Electronically Signed   By: Macy Mis M.D.   On: 05/25/2020 16:26   DG Swallowing Func-Speech Pathology  Result Date: 05/28/2020 Objective Swallowing Evaluation: Type of Study: MBS-Modified Barium Swallow Study  Patient Details Name: Charee Tumblin MRN: [id number] Date of Birth: 1950/10/21 Today's Date: 05/28/2020 Time: SLP Start Time (ACUTE ONLY): 1225 -SLP Stop Time (ACUTE ONLY): 1242 SLP Time Calculation (min) (ACUTE ONLY): 17 min Past Medical History: Past Medical History: Diagnosis Date . Allergy  . Anxiety  . Depression  . Hyperlipidemia   no meds  . Hypertension  Past Surgical History: Past Surgical History: Procedure Laterality Date . DENTAL SURGERY   . NSVD    x2 . TUBAL LIGATION  1976 HPI: Pt is a 70 y/o female presenting to ED via EMS on 5/11 with L sided weakness. MRI showing acute small infarcts along the roof of the right temporal horn and head of the right hippocampus; acute or subacute  punctate cortical  right occipital and left temporal infarcts; and possible punctate subacute infarct of posterior limb of right internal capsule. PMH including HTN, HLD, smoker, anxiety, and depression; BSE ordered d/t increased cough and weakness, concern for aspiration; Passed [facility] swallow screen;  05/25/20..  Subjective: "I have allergies and don't have my medicine" Assessment / Plan / Recommendation CHL IP CLINICAL IMPRESSIONS 05/28/2020 Clinical Impression Trace aspiration with thin barium due to mildy mistimed laryngeal closure was exhibited. Strength and ROM of structures/swallow was appropriate despite bony vertebrae at C4 not impeding epiglottic excursion. Laryngeal closure was intermittently delayed leading to trace aspiration of thin using a straw with delayed sensation and reflexive cough not seen until after MBS completed when recording watched. Instance of aggregation of thin to pyriform sinuses. Given left facial weakness and mildly prolonged oral transit, potential for pocketing will downgrade to Dys 3, continue thin, no straws, pills whole in puree. Continue ST. SLP Visit Diagnosis Dysphagia, oropharyngeal phase (R13.12) Attention and concentration deficit following -- Frontal lobe and executive function deficit following -- Impact on safety and function Mild aspiration risk   CHL IP TREATMENT RECOMMENDATION 05/28/2020 Treatment Recommendations Therapy as outlined in treatment plan below   Prognosis 05/28/2020 Prognosis for Safe Diet Advancement Good Barriers to Reach Goals -- Barriers/Prognosis Comment -- CHL IP DIET RECOMMENDATION 05/28/2020 SLP Diet Recommendations Dysphagia 3 (Mech soft) solids;Thin liquid Liquid Administration via Cup;No straw Medication Administration Whole meds with puree Compensations Minimize environmental distractions;Slow rate;Small sips/bites;Clear throat intermittently;Lingual sweep for clearance of pocketing Postural Changes Seated upright at 90 degrees   CHL IP OTHER RECOMMENDATIONS  05/28/2020 Recommended Consults -- Oral Care Recommendations Oral care BID Other Recommendations --   CHL IP FOLLOW UP RECOMMENDATIONS 05/28/2020 Follow up Recommendations Inpatient Rehab   CHL IP FREQUENCY AND DURATION 05/28/2020 Speech Therapy Frequency (ACUTE ONLY) min 2x/week Treatment Duration 2 weeks      CHL IP ORAL PHASE 05/28/2020 Oral Phase Impaired Oral - Pudding Teaspoon -- Oral - Pudding Cup -- Oral - Honey Teaspoon -- Oral - Honey Cup -- Oral - Nectar Teaspoon -- Oral - Nectar Cup -- Oral - Nectar Straw -- Oral - Thin Teaspoon -- Oral - Thin Cup WFL Oral - Thin Straw WFL Oral - Puree -- Oral - Mech Soft -- Oral - Regular Delayed oral transit Oral - Multi-Consistency -- Oral - Pill WFL Oral Phase - Comment --  CHL IP PHARYNGEAL PHASE 05/28/2020 Pharyngeal Phase Impaired Pharyngeal- Pudding Teaspoon -- Pharyngeal -- Pharyngeal- Pudding Cup -- Pharyngeal -- Pharyngeal- Honey Teaspoon -- Pharyngeal -- Pharyngeal- Honey Cup -- Pharyngeal -- Pharyngeal- Nectar Teaspoon -- Pharyngeal -- Pharyngeal- Nectar Cup -- Pharyngeal -- Pharyngeal- Nectar Straw -- Pharyngeal -- Pharyngeal- Thin Teaspoon -- Pharyngeal -- Pharyngeal- Thin Cup Pharyngeal residue - valleculae Pharyngeal -- Pharyngeal- Thin Straw Penetration/Aspiration during swallow;Delayed swallow initiation-pyriform sinuses Pharyngeal Material enters airway, passes BELOW cords and not ejected out despite cough attempt by patient;Material enters airway, passes BELOW cords without attempt by patient to eject out (silent aspiration) Pharyngeal- Puree -- Pharyngeal -- Pharyngeal- Mechanical Soft -- Pharyngeal -- Pharyngeal- Regular WFL Pharyngeal -- Pharyngeal- Multi-consistency -- Pharyngeal -- Pharyngeal- Pill WFL Pharyngeal -- Pharyngeal Comment --  CHL IP CERVICAL ESOPHAGEAL PHASE 05/28/2020 Cervical Esophageal Phase WFL Pudding Teaspoon -- Pudding Cup -- Honey Teaspoon -- Honey Cup -- Nectar Teaspoon -- Nectar Cup -- Nectar Straw -- Thin Teaspoon -- Thin Cup  -- Thin Straw -- Puree -- Mechanical Soft -- Regular -- Multi-consistency -- Pill -- Cervical Esophageal  Comment -- Houston Siren 05/28/2020, 2:12 PM  Orbie Pyo Colvin Caroli.Ed Actor Pager [phone] Office [phone]             ECHOCARDIOGRAM COMPLETE  Result Date: 05/26/2020    ECHOCARDIOGRAM REPORT   Patient Name:   DEMANI ZULOAGA Date of Exam: 05/26/2020 Medical Rec #:  [id number]       Height:       69.0 in Accession #:    [id number]      Weight:       189.0 lb Date of Birth:  November 19, 1950      BSA:          2.017 m Patient Age:    9 years        BP:           136/72 mmHg Patient Gender: F               HR:           72 bpm. Exam Location:  Inpatient Procedure: 2D Echo, Cardiac Doppler and Color Doppler Indications:    Stroke  History:        Patient has no prior history of Echocardiogram examinations.                 Risk Factors:Hypertension and Dyslipidemia.  Sonographer:    Bernadene Person RDCS Referring Phys: [id number] [place]  1. Left ventricular ejection fraction, by estimation, is 65 to 70%. The left ventricle has normal function. The left ventricle has no regional wall motion abnormalities. Left ventricular diastolic parameters were normal.  2. Right ventricular systolic function is normal. The right ventricular size is normal. There is normal pulmonary artery systolic pressure.  3. The mitral valve is normal in structure. No evidence of mitral valve regurgitation. No evidence of mitral stenosis.  4. The aortic valve is normal in structure. Aortic valve regurgitation is not visualized. No aortic stenosis is present.  5. Aortic dilatation noted. There is mild dilatation of the ascending aorta, measuring 42 mm. FINDINGS  Left Ventricle: Left ventricular ejection fraction, by estimation, is 65 to 70%. The left ventricle has normal function. The left ventricle has no regional wall motion abnormalities. The left ventricular internal cavity size was normal  in size. There is  no left ventricular hypertrophy. Left ventricular diastolic parameters were normal. Right Ventricle: The right ventricular size is normal. No increase in right ventricular wall thickness. Right ventricular systolic function is normal. There is normal pulmonary artery systolic pressure. The tricuspid regurgitant velocity is 2.24 m/s, and  with an assumed right atrial pressure of 3 mmHg, the estimated right ventricular systolic pressure is [id number] mmHg. Left Atrium: Left atrial size was normal in size. Right Atrium: Right atrial size was normal in size. Pericardium: There is no evidence of pericardial effusion. Mitral Valve: The mitral valve is normal in structure. No evidence of mitral valve regurgitation. No evidence of mitral valve stenosis. Tricuspid Valve: The tricuspid valve is grossly normal. Tricuspid valve regurgitation is trivial. Aortic Valve: The aortic valve is normal in structure. Aortic valve regurgitation is not visualized. No aortic stenosis is present. Pulmonic Valve: The pulmonic valve was grossly normal. Pulmonic valve regurgitation is not visualized. Aorta: Aortic dilatation noted. There is mild dilatation of the ascending aorta, measuring 42 mm. IAS/Shunts: The atrial septum is grossly normal.  LEFT VENTRICLE PLAX 2D LVIDd:         4.20 cm  Diastology LVIDs:  2.60 cm  LV e' medial:    7.54 cm/s LV PW:         1.00 cm  LV E/e' medial:  6.6 LV IVS:        1.10 cm  LV e' lateral:   9.41 cm/s LVOT diam:     2.10 cm  LV E/e' lateral: 5.3 LV SV:         65 LV SV Index:   32 LVOT Area:     3.46 cm  RIGHT VENTRICLE RV S prime:     14.30 cm/s TAPSE (M-mode): 2.2 cm LEFT ATRIUM             Index       RIGHT ATRIUM           Index LA diam:        2.60 cm 1.29 cm/m  RA Area:     12.80 cm LA Vol (A2C):   33.8 ml 16.76 ml/m RA Volume:   26.90 ml  13.34 ml/m LA Vol (A4C):   28.5 ml 14.13 ml/m LA Biplane Vol: 31.5 ml 15.62 ml/m  AORTIC VALVE LVOT Vmax:   90.10 cm/s LVOT Vmean:   62.100 cm/s LVOT VTI:    0.189 m  AORTA Ao Root diam: 3.50 cm Ao Asc diam:  4.20 cm MITRAL VALVE               TRICUSPID VALVE MV Area (PHT): 1.70 cm    TR Peak grad:   20.1 mmHg MV Decel Time: 447 msec    TR Vmax:        224.00 cm/s MV E velocity: 49.70 cm/s MV A velocity: 57.60 cm/s  SHUNTS MV E/A ratio:  0.86        Systemic VTI:  0.19 m                            Systemic Diam: 2.10 cm Mertie Moores MD Electronically signed by Mertie Moores MD Signature Date/Time: 05/26/2020/10:55:33 AM    Final    CT HEAD CODE STROKE WO CONTRAST  Result Date: 05/25/2020 CLINICAL DATA:  Code stroke. EXAM: CT HEAD WITHOUT CONTRAST TECHNIQUE: Contiguous axial images were obtained from the base of the skull through the vertex without intravenous contrast. COMPARISON:  None. FINDINGS: Brain: There is no acute intracranial hemorrhage, mass effect, or edema. Gray-white differentiation is preserved. 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 Ventricles and sulci are normal in size and configuration. No extra-axial collection. Vascular: No hyperdense vessel. There is intracranial atherosclerotic calcification at the skull base. Skull: Unremarkable. Sinuses/Orbits: No acute abnormality. Other: Mastoid air cells are clear. ASPECTS ([place] Stroke Program Early CT Score) - Ganglionic level infarction (caudate, lentiform nuclei, internal capsule, insula, M1-M3 cortex): 7 - Supraganglionic infarction (M4-M6 cortex): 3 Total score (0-10 with 10 being normal): 10 IMPRESSION: There is no acute intracranial hemorrhage or evidence of acute infarction. ASPECT score is 10. 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 These results were communicated to Dr. Leonie Man at 1:52 pm on 05/25/2020 by text page via the [facility] messaging system. Electronically Signed   By: Macy Mis M.D.   On: 05/25/2020 13:54   VAS US CAROTID  Result Date: 05/26/2020 Carotid Arterial Duplex Study Patient Name:  TREYA ALTER  Date of Exam:   05/26/2020 Medical Rec #: [id number]        Accession #:    [id number] Date of Birth:  14-Jan-1951       Patient Gender: F Patient Age:   69Y Exam Location:  [facility] Procedure:      VAS US CAROTID Referring Phys: [id number] [place] --------------------------------------------------------------------------------  Indications:  CVA. Risk Factors: Hypertension, hyperlipidemia. Performing Technologist: Abram Sander RVS  Examination Guidelines: A complete evaluation includes B-mode imaging, spectral Doppler, color Doppler, and power Doppler as needed of all accessible portions of each vessel. Bilateral testing is considered an integral part of a complete examination. Limited examinations for reoccurring indications may be performed as noted.  Right Carotid Findings: +----------+--------+--------+--------+------------------+--------+           PSV cm/sEDV cm/sStenosisPlaque DescriptionComments +----------+--------+--------+--------+------------------+--------+ CCA Prox  48      9               heterogenous               +----------+--------+--------+--------+------------------+--------+ CCA Distal47      14              heterogenous               +----------+--------+--------+--------+------------------+--------+ ICA Prox  57      13      1-39%   heterogenous      tortuous +----------+--------+--------+--------+------------------+--------+ ICA Distal32      9                                          +----------+--------+--------+--------+------------------+--------+ ECA       58      9                                 tortuous +----------+--------+--------+--------+------------------+--------+ +----------+--------+-------+--------+-------------------+           PSV cm/sEDV cmsDescribeArm Pressure (mmHG) +----------+--------+-------+--------+-------------------+ [id number]                                         +----------+--------+-------+--------+-------------------+ +---------+--------+--------+--------------+ VertebralPSV cm/sEDV cm/sNot identified +---------+--------+--------+--------------+  Left Carotid Findings: +----------+--------+--------+--------+------------------+--------+           PSV cm/sEDV cm/sStenosisPlaque DescriptionComments +----------+--------+--------+--------+------------------+--------+ CCA Prox  65      10              heterogenous               +----------+--------+--------+--------+------------------+--------+ CCA Distal64      13              heterogenous               +----------+--------+--------+--------+------------------+--------+ ICA Prox  106     29      1-39%   heterogenous               +----------+--------+--------+--------+------------------+--------+ ICA Distal68      24                                         +----------+--------+--------+--------+------------------+--------+ ECA       81      12  tortuous +----------+--------+--------+--------+------------------+--------+ +----------+--------+--------+--------+-------------------+           PSV cm/sEDV cm/sDescribeArm Pressure (mmHG) +----------+--------+--------+--------+-------------------+ [id number]                                         +----------+--------+--------+--------+-------------------+ +---------+--------+--+--------+--+---------+ VertebralPSV cm/s58EDV cm/s11Antegrade +---------+--------+--+--------+--+---------+   Summary: Right Carotid: Velocities in the right ICA are consistent with a 1-39% stenosis. Left Carotid: Velocities in the left ICA are consistent with a 1-39% stenosis. Vertebrals: Right vertebral artery was not visualized. *See table(s) above for measurements and observations.  Electronically signed by Antony Contras MD on 05/26/2020 at 11:59:18 AM.    Final     Results/Tests Pending at Time of  Discharge: None  Discharge Medications:  Allergies as of 05/31/2020   No Known Allergies     Medication List    STOP taking these medications   amLODipine-benazepril 10-20 MG capsule Commonly known as: LOTREL     TAKE these medications   albuterol 108 (90 Base) MCG/ACT inhaler Commonly known as: VENTOLIN HFA Inhale 2 puffs into the lungs every 4 (four) hours as needed for wheezing or shortness of breath.   amLODipine 10 MG tablet Commonly known as: NORVASC Take 1 tablet (10 mg total) by mouth daily.   aspirin 81 MG EC tablet Take 1 tablet (81 mg total) by mouth daily. Swallow whole.   busPIRone 5 MG tablet Commonly known as: BUSPAR Take 5 mg by mouth 2 (two) times daily.   clopidogrel 75 MG tablet Commonly known as: PLAVIX Take 1 tablet (75 mg total) by mouth daily.   DULoxetine 60 MG capsule Commonly known as: CYMBALTA Take 60 mg by mouth daily.   fluticasone 50 MCG/ACT nasal spray Commonly known as: FLONASE Place 1 spray into both nostrils daily.   montelukast 10 MG tablet Commonly known as: SINGULAIR Take 10 mg by mouth at bedtime.   nicotine 7 mg/24hr patch Commonly known as: NICODERM CQ - dosed in mg/24 hr Place 1 patch (7 mg total) onto the skin daily as needed (Nicotine cravings).   rosuvastatin 20 MG tablet Commonly known as: CRESTOR Take 1 tablet (20 mg total) by mouth daily.   traZODone 50 MG tablet Commonly known as: DESYREL Take 50 mg by mouth at bedtime.       Discharge Instructions: Please refer to Patient Instructions section of EMR for full details.  Patient was counseled important signs and symptoms that should prompt return to medical care, changes in medications, dietary instructions, activity restrictions, and follow up appointments.   Follow-Up Appointments:  Contact information for follow-up providers    Werner Lean, MD Follow up on 08/01/2020.   Specialty: Cardiology Why: Please arrive 15 minutes early for your 9:40am  post-hospital cardiology appointment Contact information: [address] [place] [address] [phone]            Contact information for after-discharge care    Destination    [facility] Preferred SNF .   Service: Skilled Nursing Contact information: [address] [place] [address] [place], [place], DO 05/31/2020, 11:54 AM PGY-1, [place]

## 2020-05-27 NOTE — Progress Notes (Signed)
Physical Therapy Treatment Patient Details Name: Donna [name] MRN: [id number] DOB: 01/23/1950 Today's Date: 05/27/2020    History of Present Illness Pt is 70 yo female presenting to ED via EMS on 5/11 with L sided weakness. 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 PMH including HTN, HLD, smoker, anxiety, and depression.    PT Comments    Patient received in bed, poorly positioned, states she is uncomfortable. Some garbled speech. She is agreeable to get up and into recliner. She requires min assist for bed mobility. Min assist for sit to stand from elevated bed with cues. Min assist for taking a few steps from bed to recliner. Difficulty with left hip flexion and Df to step with left. Patient will continue to benefit from skilled PT while here to improve strength and functional independence.       Follow Up Recommendations  CIR     Equipment Recommendations  Rolling walker with 5" wheels    Recommendations for Other Services Rehab consult     Precautions / Restrictions Precautions Precautions: Fall Restrictions Weight Bearing Restrictions: No    Mobility  Bed Mobility Overal bed mobility: Needs Assistance Bed Mobility: Supine to Sit     Supine to sit: Min assist;HOB elevated     General bed mobility comments: Min assist to bring L LE off bed and for positioning.    Transfers Overall transfer level: Needs assistance Equipment used: Rolling walker (2 wheeled) Transfers: Sit to/from Stand Sit to Stand: Min assist;From elevated surface         General transfer comment: Min/mod assist to stand, cues to attend to left UE to keep on rw. Step by step cues to remain safe and move with intention  Ambulation/Gait Ambulation/Gait assistance:  Min assist Gait Distance (Feet): 3 Feet Assistive device: Rolling walker (2 wheeled) Gait Pattern/deviations: Step-to pattern;Decreased step length - left;Shuffle Gait velocity: decreased   General Gait Details: Unable to pick up left LE off floor. Performed side stepping to her right and then pivot to recliner.   Stairs             Wheelchair Mobility    Modified Rankin (Stroke Patients Only) Modified Rankin (Stroke Patients Only) Pre-Morbid Rankin Score: No symptoms Modified Rankin: Moderately severe disability     Balance Overall balance assessment: Needs assistance Sitting-balance support: Feet supported;Single extremity supported Sitting balance-Leahy Scale: Fair Sitting balance - Comments: Pt preferring R UE support and needs R UE support with even small challenges to balance. Could maintain static but could not weight shift at all or move extremity without support   Standing balance support: Bilateral upper extremity supported;During functional activity Standing balance-Leahy Scale: Poor Standing balance comment: reliant on UE support and physical A                            Cognition Arousal/Alertness: Awake/alert Behavior During Therapy: Impulsive Overall Cognitive Status: No family/caregiver present to determine baseline cognitive functioning Area of Impairment: Safety/judgement;Awareness;Attention;Problem solving                   Current Attention Level: Sustained   Following Commands: Follows one step commands inconsistently;Follows multi-step commands inconsistently Safety/Judgement: Decreased awareness of safety;Decreased awareness of deficits Awareness: Intellectual Problem Solving: Slow processing;Requires  verbal cues;Difficulty sequencing General Comments: Patient motivated to get better, requires cues to move slowly and deliberately.      Exercises Total Joint Exercises Ankle Circles/Pumps: PROM;Left;10 reps Towel Squeeze:  AAROM;Both;10 reps Heel Slides: PROM;Left;5 reps Hip ABduction/ADduction: AAROM;Left;5 reps Long Arc Quad: AAROM;Left;5 reps    General Comments        Pertinent Vitals/Pain Pain Assessment: No/denies pain Faces Pain Scale: No hurt    Home Living                      Prior Function            PT Goals (current goals can now be found in the care plan section) Acute Rehab PT Goals Patient Stated Goal: "Get better" PT Goal Formulation: With patient Time For Goal Achievement: 06/09/20 Potential to Achieve Goals: Good Additional Goals Additional Goal #1: Will increase L LE strength to 3/5 for improved safety with gait and stairs Progress towards PT goals: Progressing toward goals    Frequency    Min 4X/week      PT Plan Current plan remains appropriate    Co-evaluation              AM-PAC PT "6 Clicks" Mobility   Outcome Measure  Help needed turning from your back to your side while in a flat bed without using bedrails?: A Little Help needed moving from lying on your back to sitting on the side of a flat bed without using bedrails?: A Little Help needed moving to and from a bed to a chair (including a wheelchair)?: A Lot Help needed standing up from a chair using your arms (e.g., wheelchair or bedside chair)?: A Lot Help needed to walk in hospital room?: A Lot Help needed climbing 3-5 steps with a railing? : Total 6 Click Score: 13    End of Session Equipment Utilized During Treatment: Gait belt Activity Tolerance: Patient tolerated treatment well Patient left: in chair;with chair alarm set;with call bell/phone within reach Nurse Communication: Mobility status PT Visit Diagnosis: Other abnormalities of gait and mobility (R26.89);Muscle weakness (generalized) (M62.81);Hemiplegia and hemiparesis;Difficulty in walking, not elsewhere classified (R26.2) Hemiplegia - Right/Left: Left Hemiplegia - dominant/non-dominant: Non-dominant Hemiplegia - caused  by: Cerebral infarction     Time: 1050-1107 PT Time Calculation (min) (ACUTE ONLY): 17 min  Charges:  $Therapeutic Activity: 8-22 mins                     Cylee Dattilo, PT, GCS 05/27/20,11:25 AM

## 2020-05-27 NOTE — Evaluation (Signed)
Clinical/Bedside Swallow Evaluation Patient Details  Name: Donna [name] MRN: [id number] Date of Birth: [birth year]  Today's Date: 05/27/2020 Time: SLP Start Time (ACUTE ONLY): 1410 SLP Stop Time (ACUTE ONLY): 1435 SLP Time Calculation (min) (ACUTE ONLY): 25 min  Past Medical History:  Past Medical History:  Diagnosis Date  . Allergy   . Anxiety   . Depression   . Hyperlipidemia    no meds   . Hypertension    Past Surgical History:  Past Surgical History:  Procedure Laterality Date  . DENTAL SURGERY    . NSVD     x2  . TUBAL LIGATION  1976   HPI:  Pt is a 70 y/o female presenting to ED via EMS on 5/11 with L sided weakness. 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 PMH including HTN, HLD, smoker, anxiety, and depression; BSE ordered d/t increased cough and weakness, concern for aspiration; Passed [facility] swallow screen;  05/25/20.Marland Kitchen CXR 05/27/20 negative for acute processes.  Assessment / Plan / Recommendation Clinical Impression  Pt seen for clinical swallowing evaluation with wet vocal quality/congested cough upon SLP arrival noted.  Pt exhibited delayed cough, delayed initiation of the swallow and prolonged oral transit/impaired mastication/sensation on left with concern for silent aspiration and pharyngeal clearance.  Pt has dysarthric speech pattern (approximately 50-75% intelligible depending on length of utterance) paired with overt s/s of aspiration which place her at risk with current diet.  Pt would benefit from an objective assessment (MBS) to determine safest diet and liquids post-CVA.  ST will continue to acutely f/u in this setting. SLP Visit Diagnosis: Dysphagia, oropharyngeal phase (R13.12)    Aspiration Risk  Moderate aspiration risk;Mild  aspiration risk    Diet Recommendation   Dysphagia 3/thin liquids  Medication Administration: Whole meds with puree    Other  Recommendations Oral Care Recommendations: Oral care BID   Follow up Recommendations Inpatient Rehab      Frequency and Duration min 2x/week  1 week       Prognosis Prognosis for Safe Diet Advancement: Good      Swallow Study   General Date of Onset: 05/26/20 HPI: Pt is a 70 y/o female presenting to ED via EMS on 5/11 with L sided weakness. 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 PMH including HTN, HLD, smoker, anxiety, and depression; BSE ordered d/t increased cough and weakness, concern for aspiration; Passed [facility] swallow screen;  05/25/20.. Type of Study: Bedside Swallow Evaluation Previous Swallow Assessment: [facility] swallow screen; passed 05/25/20 Diet Prior to this Study: Regular;Thin liquids Temperature Spikes Noted: No Respiratory Status: Room air History of Recent Intubation: No Behavior/Cognition: Alert;Cooperative;Lethargic/Drowsy Oral Cavity Assessment: Excessive secretions Oral Care Completed by SLP: Recent completion by staff Oral Cavity - Dentition: Adequate natural dentition Vision: Functional for self-feeding Self-Feeding Abilities: Able to feed self Patient Positioning: Upright in bed Baseline Vocal Quality: Low vocal intensity;Other (comment) (Dysarthric) Volitional Cough: Wet;Congested Volitional Swallow: Able to elicit    Oral/Motor/Sensory Function Overall Oral Motor/Sensory Function: Mild impairment Facial ROM: Reduced left Facial Symmetry: Abnormal symmetry left Facial Strength: Reduced left Facial Sensation: Reduced left  Lingual ROM: Reduced left Lingual Symmetry: Abnormal symmetry left Lingual  Strength: Reduced   Ice Chips Ice chips: Within functional limits Presentation: Spoon   Thin Liquid Thin Liquid: Impaired Presentation: Cup;Straw;Spoon Oral Phase Functional Implications: Prolonged oral transit Pharyngeal  Phase Impairments: Suspected delayed Swallow;Cough - Delayed    Nectar Thick Nectar Thick Liquid: Not tested   Honey Thick Honey Thick Liquid: Not tested   Puree Puree: Impaired Presentation: Spoon Oral Phase Functional Implications: Prolonged oral transit   Solid     Solid: Impaired Presentation: Self Fed Oral Phase Impairments: Impaired mastication;Reduced lingual movement/coordination Oral Phase Functional Implications: Left anterior spillage;Prolonged oral transit Pharyngeal Phase Impairments: Suspected delayed Swallow;Cough - Delayed      Elvina Sidle, M.S., CCC-SLP 05/27/2020,3:02 PM

## 2020-05-27 NOTE — Progress Notes (Signed)
STROKE TEAM PROGRESS NOTE   INTERVAL HISTORY No acute events .  Patient lying comfortably in bed. Left side remains weak, therapy teams recommending CIR. no family at the bedside.  Echocardiogram shows ejection fraction 65 to 70% without definite cardiac source of embolism.  There is mild dilation of the aortic root measuring 42 mm.  Carotid ultrasound shows no significant t extracranial stenosis Vitals:   05/26/20 2350 05/27/20 0304 05/27/20 0744 05/27/20 1119  BP: (!) 158/71 (!) 164/66 (!) 154/71 (!) 167/61  Pulse: 69 68 72 67  Resp: (!) 21 17 17 18   Temp: 98.4 F (36.9 C) 98 F (36.7 C) 98.5 F (36.9 C) 98.3 F (36.8 C)  TempSrc: Oral Oral  Oral  SpO2: 98% 100% 94% 100%   CBC:  Recent Labs  Lab 05/25/20 1340 05/25/20 1349 05/26/20 0319  WBC 11.4*  --  10.6*  NEUTROABS 6.7  --   --   HGB 15.3* 15.0 14.4  HCT 46.2* 44.0 43.1  MCV 99.6  --  98.4  PLT 261  --  322   Basic Metabolic Panel:  Recent Labs  Lab 05/25/20 1340 05/25/20 1349 05/26/20 0319  NA 137 140 138  K 4.9 4.6 3.7  CL 109 110 110  CO2 24  --  21*  GLUCOSE 97 88 89  BUN 20 22 17   CREATININE 1.00 0.90 0.83  CALCIUM 9.1  --  9.1   Lipid Panel:  Recent Labs  Lab 05/26/20 0319  CHOL 168  TRIG 192*  HDL 47  CHOLHDL 3.6  VLDL 38  LDLCALC 83   HgbA1c:  Recent Labs  Lab 05/26/20 0319  HGBA1C 5.0   Urine Drug Screen: No results for input(s): LABOPIA, COCAINSCRNUR, LABBENZ, AMPHETMU, THCU, LABBARB in the last 168 hours.  Alcohol Level No results for input(s): ETH in the last 168 hours.  IMAGING  CT head There is no acute intracranial hemorrhage or evidence of acute infarction. ASPECT score is 10.  Age-indeterminate small vessel infarct of right corona radiata  MR Brain Acute small infarcts along the roof of the right temporal horn and head of the right hippocampus. Acute or subacute punctate cortical right occipital and left temporal infarcts. Possible punctate subacute infarct of  posterior limb of right internal capsule.  Chronic infarct right corona radiata.  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 Intracranially, suspect retrograde flow to the PICA origin. Otherwise patent anterior and posterior circulations without stenosis.  2D Echo Left Ventricle: Left ventricular ejection fraction, by estimation, is 65 to 70%. The left ventricle has normal function. The left ventricle has no regional wall motion abnormalities. The left ventricular internal cavity size was normal in size. There is  no left ventricular hypertrophy. Left ventricular diastolic parameters were normal.   Right Ventricle: The right ventricular size is normal. No increase in  right ventricular wall thickness. Right ventricular systolic function is normal. There is normal pulmonary artery systolic pressure. The tricuspid regurgitant velocity is 2.24 m/s, and  with an assumed right atrial pressure of 3 mmHg, the estimated right ventricular systolic pressure is 02.5 mmHg.   Left Atrium: Left atrial size was normal in size.   Right Atrium: Right atrial size was normal in size.   Pericardium: There is no evidence of pericardial effusion.   Mitral Valve: The mitral valve is normal in structure. No evidence of  mitral valve regurgitation. No evidence of mitral valve stenosis.   Tricuspid Valve: The tricuspid valve is grossly  normal. Tricuspid valve regurgitation is trivial.   Aortic Valve: The aortic valve is normal in structure. Aortic valve  regurgitation is not visualized. No aortic stenosis is present.   Pulmonic Valve: The pulmonic valve was grossly normal. Pulmonic valve regurgitation is not visualized.   Aorta: Aortic dilatation noted. There is mild dilatation of the ascending aorta, measuring 42 mm.   IAS/Shunts: The atrial septum is grossly normal.   PHYSICAL EXAM Pleasant elderly African-American lady not in distress. . Afebrile. Head is  nontraumatic. Neck is supple without bruit.    Cardiac exam no murmur or gallop. Lungs are clear to auscultation. Distal pulses are well felt. Neurological Exam:  She is awake alert oriented to time place and person.  Speech and language appear normal.  Extraocular movements are full range without nystagmus.  She has slight visual inattention to the left is able to see in the left field of vision.  Mild left lower facial asymmetry.  Tongue midline.  Motor system exam shows mild left upper extremity drift with weakness predominantly of the left grip and intrinsic hand muscles.  Significant left lower extremity drift with only 1/5 strength.  Normal strength on the right.  Decreased left hemibody touch pinprick sensation.  Reflexes are normal on the right and depressed on the left.  Left plantar equivocal right downgoing.  Gait not tested.  ASSESSMENT/PLAN Donna [name] is a 70 y.o. female with a medical history significant for hypertension, hyperlipidemia, and tobacco use who presented to the ED as a Code Stroke via EMS for evaluation after waking up with stroke symptoms consisting of left mouth droop and left-sided weakness and sensation of left leg heaviness with impaired balance. She reported non-compliance with medications x6 months. NIHSS 6. Initial CT head without acute infarction but with age-indeterminate small vessel infarct of right corona radiata  MRI showed acute small infarcts along the roof of the right temporal horn and head of the right hippocampus. Acute or subacute punctate cortical right occipital and left temporal infarcts. Possible punctate subacute infarct of posterior limb of right internal capsule. Etiology is likely combination of large vessel disease and cardioembolic given involvement of different vascular distributions.  Code Stroke CT head No acute abnormality. Age-indeterminate small vessel infarct of right corona radiata was present.   MRA Occlusion or high-grade origin  stenosis of extracranial right vertebral artery. Intracranially, suspect retrograde flow to the PICA origin  MRI as above    Carotid Doppler: No significant stenosis   2D Echo EF 65-70%, No thrombus, wall motion abnormality or shunt found.   LDL 83  HgbA1c 5.0  VTE prophylaxis - Lovenox 40mg  ppx    Diet   Diet heart healthy/carb modified Room service appropriate? Yes; Fluid consistency: Thin   No AC/AP prior to admission  DAPT x 3 months followed by aspirin alone.    Therapy recommendations:  CIR by OT. PT and ST pending  Disposition:  TBD  Hypertension  BP 169/80 on admission  Reported non-compliance with BP meds x 6 mos  Stable . Permissive hypertension (OK if < 220/120) but gradually normalize in 5-7 days . 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  Recommend dual antiplatelet therapy of aspirin Plavix for 3 months but also a 30-day heart monitor to look for paroxysmal A. fib.  Follow-up as outpatient stroke clinic in 6 weeks.  Discussed with family practice teaching service intern on-call.  Stroke team will sign off.  Kindly call for questions..  Long discussion with patient and daughter at the bedside and answered questions.  She will likely need ongoing therapies and rehab.  Greater than 50% time during the 25-minute visit was spent in counseling and coordination of care about her stroke and left persistent discussion about  evaluation treatment plan and answering questions.  Antony Contras, MD Medical Director [place] Pager: [phone] 05/27/2020 11:20 AM   To contact Stroke Continuity provider, please refer to [email or web address] After hours, contact General Neurology

## 2020-05-27 NOTE — Evaluation (Signed)
Speech Language Pathology Evaluation Patient Details Name: Donna [name] MRN: [id number] DOB: 1950-02-20 Today's Date: 05/27/2020 Time: 7371-0626 SLP Time Calculation (min) (ACUTE ONLY): 40 min  Problem List:  Patient Active Problem List   Diagnosis Date Noted  . Cigarette nicotine dependence without complication   . Stroke ([place]) 05/25/2020  . Primary hypertension    Past Medical History:  Past Medical History:  Diagnosis Date  . Allergy   . Anxiety   . Depression   . Hyperlipidemia    no meds   . Hypertension    Past Surgical History:  Past Surgical History:  Procedure Laterality Date  . DENTAL SURGERY    . NSVD     x2  . TUBAL LIGATION  1976   HPI: Pt is 70 yo female presenting to ED via EMS on 5/11 with L sided weakness. 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 PMH including HTN, HLD, smoker, anxiety, and depression.     Assessment / Plan / Recommendation Clinical Impression  Pt alert, pleasant and minimally tearful during session due to ramifications of stroke and reporting she is "scared".  Offered support.  Pt presents with mild left facial asymmetry- decreased labialnasal fold but sensation intact -otherwise CN exam unremarkable for speech structures.  Phonation appears weak but pt states this is normal for her.  SLUMS administered to pt with her scoring 19/45 - per testing scoring consistent with significant cogntive deficits (1-19).  Pt demonstrates decreased sustained attention and working memory.  Her expressive and receptive language is intact.  She recalled detail from narrative but memory of five random items difficult *context/cohesiveness helpful for pt.  She did recall 3/5 objects Independently and 2 of 5 with category cue.  Pt  admits to change in her ability to attend to tasks.  Pt will benefit from cognitive-linguistic treatment to help maximize her independence and decrease caregiver burden.  Pt agreeable to plan  .    SLP Assessment  SLP Recommendation/Assessment: Patient needs continued Speech Lanaguage Pathology Services SLP Visit Diagnosis: Cognitive communication deficit (R41.841)    Follow Up Recommendations  Other (comment) (? CIR)    Frequency and Duration min 1 x/week  1 week      SLP Evaluation Cognition  Overall Cognitive Status: No family/caregiver present to determine baseline cognitive functioning Arousal/Alertness: Awake/alert Orientation Level: Oriented X4 Attention: Sustained Sustained Attention: Impaired Sustained Attention Impairment: Functional basic Memory: Impaired Memory Impairment: Retrieval deficit Awareness: Appears intact Problem Solving: Impaired Safety/Judgment: Appears intact Comments: pt tearful re: her current stroke and requiring assistance stating she does not want to rely on anyone else, she provides care to others       Comprehension  Auditory Comprehension Overall Auditory Comprehension: Appears within functional limits for tasks assessed Yes/No Questions: Not tested Commands: Impaired One Step Basic Commands: 75-100% accurate Two Step Basic Commands: 75-100% accurate Conversation: Complex Other Conversation Comments: decreased attention and delayed responses noted Visual Recognition/Discrimination Discrimination: Not tested Reading Comprehension Reading Status: Not tested    Expression Expression Primary Mode of Expression: Verbal Verbal Expression Overall Verbal Expression: Appears within functional limits for tasks assessed Initiation: No impairment Repetition: No impairment Naming: Not tested Written Expression Dominant Hand: Right Written Expression: Exceptions to [facility] (clock drawing conducted x2 with improper placement of hands for time)  Oral / Motor  Oral Motor/Sensory Function Overall Oral Motor/Sensory Function: Within functional limits Motor Speech Overall Motor Speech: Appears within functional limits for tasks assessed Respiration: Within functional limits Phonation: Low vocal intensity (pt reports this is baseline) Resonance: Within functional limits Articulation: Within functional limitis Intelligibility: Intelligible Motor Planning: Witnin functional limits Interfering Components: Premorbid status   GO                    Macario Golds 05/27/2020, 11:32 AM Kathleen Lime, MS [place] Office [phone] Pager [phone]

## 2020-05-27 NOTE — Progress Notes (Signed)
   Cardiology asked to place order for 30 day event monitor to further evaluate for atrial fibrillation in the setting of a CVA. Patient does not follow with cardiology outpatient. Order placed for Dr. Gasper Sells to read who is DOD on the day of discharge. Will arrange follow-up in our office in ~2 months to review results.  Abigail Butts, PA-C 05/27/20; 12:26 PM

## 2020-05-27 NOTE — Progress Notes (Signed)
Inpatient Rehabilitation Admissions Coordinator  I met with patient at bedside and then contacted her daughter, Donna [name] by phone. I discussed goals and expectations of a possible CIR admit pending caregiver support at home and CIR bed availability next week. Daughter does not feel she has the needed caregiver supports at home, but will discuss with her Mom and brother and I will follow up with them Monday.  Danne Baxter, RN, MSN Rehab Admissions Coordinator [phone] 05/27/2020 3:24 PM

## 2020-05-27 NOTE — Progress Notes (Deleted)
Bedside Swallow Evaluation    05/27/20 1400  SLP Visit Information  SLP Received On 05/27/20  Subjective  Subjective "I have allergies and don't have my medicine"  Patient/Family Stated Goal Become independent again  General Information  Date of Onset 05/26/20  HPI Pt is a 70 y/o female presenting to ED via EMS on 5/11 with L sided weakness. 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 PMH including HTN, HLD, smoker, anxiety, and depression; BSE ordered d/t increased cough and weakness, concern for aspiration; Passed Yale swallow screen;  05/25/20..  Type of Study Bedside Swallow Evaluation  Previous Swallow Assessment Yale swallow screen; passed 05/25/20  Diet Prior to this Study Regular;Thin liquids  Temperature Spikes Noted No  Respiratory Status Room air  History of Recent Intubation No  Behavior/Cognition Alert;Cooperative;Lethargic/Drowsy  Oral Cavity Assessment Excessive secretions  Oral Care Completed by SLP Recent completion by staff  Oral Cavity - Dentition Adequate natural dentition  Vision Functional for self-feeding  Self-Feeding Abilities Able to feed self  Patient Positioning Upright in bed  Baseline Vocal Quality Low vocal intensity;Other (comment) (Dysarthric)  Volitional Cough Wet;Congested  Volitional Swallow Able to elicit  Oral Assessment (Complete on admission/transfer/change in patient condition)  Does patient have any of the following "high(er) risk" factors? None of the above  Does patient have any of the following "at risk" factors? Other - dysphagia  Patient is AT RISK Order set for Adult Oral Care Protocol initiated -  "At Risk Patients" option selected (see row information)  Oral Motor/Sensory Function  Overall Oral Motor/Sensory  Function Mild impairment  Facial ROM Reduced left  Facial Symmetry Abnormal symmetry left  Facial Strength Reduced left  Facial Sensation Reduced left  Lingual ROM Reduced left  Lingual Symmetry Abnormal symmetry left  Lingual Strength Reduced  Ice Chips  Ice chips WFL  Presentation Spoon  Thin Liquid  Thin Liquid Impaired  Presentation Cup;Straw;Spoon  Oral Phase Functional Implications Prolonged oral transit  Pharyngeal  Phase Impairments Suspected delayed Swallow;Cough - Delayed  Nectar Thick Liquid  Nectar Thick Liquid NT  Honey Thick Liquid  Honey Thick Liquid NT  Puree  Puree Impaired  Presentation Spoon  Oral Phase Functional Implications Prolonged oral transit  Solid  Solid Impaired  Presentation Self Fed  Oral Phase Impairments Impaired mastication;Reduced lingual movement/coordination  Oral Phase Functional Implications Left anterior spillage;Prolonged oral transit  Pharyngeal Phase Impairments Suspected delayed Swallow;Cough - Delayed  SLP - End of Session  Patient left in bed;with call bell/phone within reach;with family/visitor present  Nurse Communication Aspiration precautions reviewed;Diet recommendation;Plan for instrumental testing  SLP Assessment  Clinical Impression Statement (ACUTE ONLY) See impression statement  SLP Visit Diagnosis Dysphagia, oropharyngeal phase (R13.12)  Impact on safety and function Moderate aspiration risk;Mild aspiration risk  Swallow Evaluation Recommendations  Medication Administration Whole meds with puree  Treatment Plan  Oral Care Recommendations Oral care BID  Treatment Recommendations Defer until completion of intrumental exam  Follow up Recommendations Inpatient Rehab  Speech Therapy Frequency (ACUTE ONLY) min 2x/week  Treatment Duration 1 week  Interventions Aspiration precaution training;Compensatory techniques;Patient/family education;Pharyngeal strengthening exercises;Diet toleration management by SLP  Prognosis   Prognosis for Safe Diet Advancement Good  Individuals Consulted  Consulted and Agree with Results and Recommendations Patient;Family member/caregiver  Family Member Consulted son  Progression  Toward Goals  Progression toward goals Progressing toward goals  Potential to Achieve Goals (ACUTE ONLY) Good  SLP Time Calculation  SLP Start Time (ACUTE ONLY) 1410  SLP Stop Time (ACUTE ONLY) 1435  SLP Time Calculation (min) (ACUTE ONLY) 25 min  SLP Evaluations  $ SLP Speech Visit 1 Visit  SLP Evaluations  $BSS Swallow 1 Procedure    Donna [name] M.Ed CCC-SLP

## 2020-05-28 ENCOUNTER — Inpatient Hospital Stay (HOSPITAL_COMMUNITY): Payer: Medicare Other

## 2020-05-28 DIAGNOSIS — I10 Essential (primary) hypertension: Secondary | ICD-10-CM | POA: Diagnosis not present

## 2020-05-28 DIAGNOSIS — I639 Cerebral infarction, unspecified: Secondary | ICD-10-CM | POA: Diagnosis not present

## 2020-05-28 MED ORDER — SENNA 8.6 MG PO TABS
1.0000 | ORAL_TABLET | Freq: Every day | ORAL | Status: DC
  Administered 2020-05-28 – 2020-05-30 (×3): 8.6 mg via ORAL
  Filled 2020-05-28 (×3): qty 1

## 2020-05-28 NOTE — Progress Notes (Signed)
Family Medicine Teaching Service Daily Progress Note Intern Pager: [phone]  Patient name: Donna [name] Medical record number: [id number] Date of birth: October 16, 1950 Age: 70 y.o. Gender: female  Primary Care Provider: Care, Jinny Blossom Total Access Consultants: Neurology Code Status: FULL  Pt Overview and Major Events to Date:  5/11: Code stroke activated, Admitted  Assessment and Plan: Donna [name] a 70 y.o.femalepresenting with L sided. PMH is significant for hypertension, hyperlipidemia, depression session and anxiety, tobacco use.  Acute CVA with residual left-sided weakness Stable residual deficits.  Planning for CIR admission if able.  CIR planning to follow-up with family on Monday 5/16. -Neurology has signed off, will follow up outpatient in 6 weeks -Dual antiplatelet therapy with aspirin and Plavix x3 weeks, followed by aspirin alone -30-day heart monitor to assess for PAF -Continue high intensity statin   -PT/OT: CIR  HTN: Chronic, stable Started on amlodipine 5 on 5/13 in setting of no antihypertensives for 6 months.  Was previously prescribed amlodipine-benazepril 10-20 mg daily.  BP this a.m. 156/71. -Continue Amlodipine 5 mg   HLD: Chronic LDL goal <70.  Lipid panel significant for elevated triglycerides 192, LDL 83, cholesterol 168. -Continue rosuvastatin 20 mg daily  Anxiety  Depression  Grief Chaplain visited with the patient on 5/12. Her home medications also restarted on 5/12. She will benefit from continued management post-hospitalization as depression can be worsened after stroke.  -Continue home Cymbalta 60 mg daily -Continue BuSpar 5 mg twice daily -Trazodone 50 mg daily at bedtime   Tobacco Abuse  Cough, ?COPD Smokes 1/2 PPD.  Restarted home Flonase and inhalers on 5/13 due to increased cough.  Chest x-ray was negative.  SLP consulted and saw patient on 5/13, no changes at this time, can follow in CIR.  Cough improved per patient.   Lungs clear on exam. -Offer Nicotine patch inpatient -Smoking cessation -Albuterol 2 puffs PRN wheezing, shortness of breath -Flonase daily   -Appreciate SLP recs  Constipation Has not yet had a BM.   -MiraLax 17 g daily  - add senna QD  FEN/GI: Heart healthy/carb modified PPx: Lovenox   Status is: Inpatient  Remains inpatient appropriate because:Unsafe d/c plan   Dispo: The patient is from: Home              Anticipated d/c is to: CIR              Patient currently is medically stable to d/c.   Difficult to place patient No   Subjective:  Patient denies complaints this morning.  She states that her cough has improved.  She states that she has still not had a bowel movement and would like to have one.  She denies any abdominal pain.  Objective: Temp:  [97.5 F (36.4 C)-98.5 F (36.9 C)] 97.8 F (36.6 C) (05/14 0324) Pulse Rate:  [63-72] 67 (05/14 0324) Resp:  [16-18] 18 (05/14 0324) BP: (149-167)/(58-79) 156/71 (05/14 0324) SpO2:  [94 %-100 %] 98 % (05/14 0324)  Physical Exam:  General: 70 y.o. female in NAD Cardio: RRR no m/r/g Lungs: CTAB, no wheezing, no rhonchi, no crackles, no IWOB on RA Skin: warm and dry Extremities: No edema Neuro: RUE and RLE 5/5 strength, 3/5 grip strength L hand, 2/5 strength LLE, 3/5 strength LUE, slurred speech, left facial droop   Laboratory: Recent Labs  Lab 05/25/20 1340 05/25/20 1349 05/26/20 0319  WBC 11.4*  --  10.6*  HGB 15.3* 15.0 14.4  HCT 46.2* 44.0 43.1  PLT 261  --  247   Recent Labs  Lab 05/25/20 1340 05/25/20 1349 05/26/20 0319  NA 137 140 138  K 4.9 4.6 3.7  CL 109 110 110  CO2 24  --  21*  BUN 20 22 17   CREATININE 1.00 0.90 0.83  CALCIUM 9.1  --  9.1  PROT 6.2*  --   --   BILITOT 0.3  --   --   ALKPHOS 63  --   --   ALT 17  --   --   AST 22  --   --   GLUCOSE 97 88 89    Imaging/Diagnostic Tests: DG Chest 1 View  Result Date: 05/27/2020 CLINICAL DATA:  Cough EXAM: CHEST  1 VIEW  COMPARISON:  None. FINDINGS: Heart size and mediastinal contours are within normal limits. Lungs are clear. No pleural effusion or pneumothorax is seen. Osseous structures about the chest are unremarkable. IMPRESSION: No active disease. No evidence of pneumonia or pulmonary edema. Electronically Signed   By: Franki Cabot M.D.   On: 05/27/2020 10:45     Orlandus Borowski, Bernita Raisin, DO 05/28/2020, 5:55 AM PGY-3, [place] Intern pager: [phone], text pages welcome

## 2020-05-28 NOTE — Progress Notes (Signed)
Modified Barium Swallow Progress Note  Patient Details  Name: Katlynn Naser MRN: [id number] Date of Birth: [birth year]  Today's Date: 05/28/2020  Modified Barium Swallow completed.  Full report located under Chart Review in the Imaging Section.  Brief recommendations include the following:  Clinical Impression  Trace aspiration with thin barium due to mildy mistimed laryngeal closure was exhibited. Strength and ROM of structures/swallow was appropriate despite bony vertebrae at C4 not impeding epiglottic excursion. Laryngeal closure was intermittently delayed leading to trace aspiration of thin using a straw with delayed sensation and reflexive cough not seen until after MBS completed when recording watched. Instance of aggregation of thin to pyriform sinuses. Given left facial weakness and mildly prolonged oral transit, potential for pocketing will downgrade to Dys 3, continue thin, no straws, pills whole in puree. Continue ST.   Swallow Evaluation Recommendations       SLP Diet Recommendations: Dysphagia 3 (Mech soft) solids;Thin liquid   Liquid Administration via: Cup;No straw   Medication Administration: Whole meds with puree   Supervision: Patient able to self feed;Intermittent supervision to cue for compensatory strategies;Staff to assist with self feeding   Compensations: Minimize environmental distractions;Slow rate;Small sips/bites;Clear throat intermittently;Lingual sweep for clearance of pocketing   Postural Changes: Seated upright at 90 degrees   Oral Care Recommendations: Oral care BID        Houston Siren 05/28/2020,2:13 PM  Orbie Pyo Colvin Caroli.Ed Risk analyst [phone] Office [phone]

## 2020-05-28 NOTE — Progress Notes (Addendum)
Physical Therapy Treatment Patient Details Name: Donna [name] MRN: [id number] DOB: [birth year] Today's Date: 05/28/2020    History of Present Illness Pt is 70 yo female presenting to ED via EMS on 5/11 with L sided weakness. 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 PMH including HTN, HLD, smoker, anxiety, and depression.    PT Comments    Progressing towards goals, slowly increasing ambulatory distance, walking forward and backwards with mon assist using RW. Highly motivated. Practiced seated and standing balance, challenging limits of stability, and LE exercises with active assistance. Patient will continue to benefit from skilled physical therapy services to further improve independence with functional mobility. .   Follow Up Recommendations  CIR     Equipment Recommendations  Rolling walker with 5" wheels    Recommendations for Other Services Rehab consult     Precautions / Restrictions Precautions Precautions: Fall Restrictions Weight Bearing Restrictions: No    Mobility  Bed Mobility Overal bed mobility: Needs Assistance Bed Mobility: Supine to Sit     Supine to sit: Min assist;HOB elevated     General bed mobility comments: Min assist to bring L LE off bed and for positioning. Cues for technique, good strength pulling through RUE. Cues for safety and squaring hips on EOB.    Transfers Overall transfer level: Needs assistance Equipment used: Rolling walker (2 wheeled) Transfers: Sit to/from [facility] Sit to Stand: Min assist;From elevated surface Stand pivot transfers: Mod assist       General transfer comment: Educated on sequencing hand and LE placement prior to standing. Practiced sit<>stand from bed x2 with  min asist, Mod assist from recliner due to lower surface. Frequently reaching to pull through walker despite cues but improved with practice. Mod assist for pivot to recliner, gentle knee block due to noted instability but did not overtly buckle. Assist with RW sequencing with turn.  Ambulation/Gait Ambulation/Gait assistance: Mod assist Gait Distance (Feet): 6 Feet (x2) Assistive device: Rolling walker (2 wheeled) Gait Pattern/deviations: Step-to pattern;Decreased step length - right;Decreased step length - left;Decreased stance time - left;Decreased stride length;Decreased dorsiflexion - left;Decreased weight shift to left Gait velocity: slow Gait velocity interpretation: <1.31 ft/sec, indicative of household ambulator General Gait Details: Mod assist with RW, assistance to advance LLE, RW control, and balance. VC for sequencing and weight shift to load and unload LLE. Slow, requires w/c follow ea step forward. Pt slightly impulsive. Cues to wait for therapist to guard Rt knee due to instability but tolerated without full block. Lacks terminal knee control.   Stairs             Wheelchair Mobility    Modified Rankin (Stroke Patients Only) Modified Rankin (Stroke Patients Only) Pre-Morbid Rankin Score: No symptoms Modified Rankin: Moderately severe disability     Balance Overall balance assessment: Needs assistance Sitting-balance support: Feet supported;No upper extremity supported Sitting balance-Leahy Scale: Fair Sitting balance - Comments: Challenged dynamic seated balance, reaching with RUE, forward, high, low, left and Rt. LoB when reaching Rt with limited range due to Lt trunkal weakness.   Standing balance support: Bilateral upper extremity supported;During functional activity Standing balance-Leahy Scale: Poor Standing balance comment: reliant on UE support and physical A  Cognition Arousal/Alertness: Awake/alert Behavior During  Therapy: Impulsive Overall Cognitive Status: No family/caregiver present to determine baseline cognitive functioning Area of Impairment: Safety/judgement;Awareness;Attention;Problem solving                   Current Attention Level: Sustained   Following Commands: Follows one step commands inconsistently;Follows multi-step commands inconsistently Safety/Judgement: Decreased awareness of safety;Decreased awareness of deficits Awareness: Intellectual Problem Solving: Slow processing;Requires verbal cues;Difficulty sequencing General Comments: Patient motivated to get better, requires cues to move slowly and deliberately.      Exercises General Exercises - Lower Extremity Ankle Circles/Pumps: AAROM;Left;10 reps Long Arc Quad: AAROM;Left;10 reps;Seated Heel Slides: AAROM;Left;10 reps;Supine Hip ABduction/ADduction: Strengthening;Both;10 reps;Seated Hip Flexion/Marching: AAROM;Left;10 reps;Seated Mini-Sqauts: Strengthening;Both;5 reps;Standing    General Comments        Pertinent Vitals/Pain Pain Assessment: No/denies pain Pain Intervention(s): Monitored during session;Repositioned (Lt elbow support to approximate Molino jt.)    Home Living                      Prior Function            PT Goals (current goals can now be found in the care plan section) Acute Rehab PT Goals PT Goal Formulation: With patient Time For Goal Achievement: 06/09/20 Potential to Achieve Goals: Good Progress towards PT goals: Progressing toward goals    Frequency    Min 4X/week      PT Plan Current plan remains appropriate    Co-evaluation              AM-PAC PT "6 Clicks" Mobility   Outcome Measure  Help needed turning from your back to your side while in a flat bed without using bedrails?: A Little Help needed moving from lying on your back to sitting on the side of a flat bed without using bedrails?: A Little Help needed moving to and from a bed to a chair  (including a wheelchair)?: A Lot Help needed standing up from a chair using your arms (e.g., wheelchair or bedside chair)?: A Lot Help needed to walk in hospital room?: A Lot Help needed climbing 3-5 steps with a railing? : Total 6 Click Score: 13    End of Session Equipment Utilized During Treatment: Gait belt Activity Tolerance: Patient tolerated treatment well Patient left: in chair;with call bell/phone within reach;with chair alarm set Nurse Communication: Mobility status PT Visit Diagnosis: Other abnormalities of gait and mobility (R26.89);Muscle weakness (generalized) (M62.81);Hemiplegia and hemiparesis;Difficulty in walking, not elsewhere classified (R26.2) Hemiplegia - Right/Left: Left Hemiplegia - dominant/non-dominant: Non-dominant Hemiplegia - caused by: Cerebral infarction     Time: 1501-1530 PT Time Calculation (min) (ACUTE ONLY): 29 min  Charges:  $Gait Training: 8-22 mins $Therapeutic Exercise: 8-22 mins                     Elayne Snare, PT, DPT   Ellouise Newer 05/28/2020, 3:43 PM

## 2020-05-29 DIAGNOSIS — I10 Essential (primary) hypertension: Secondary | ICD-10-CM | POA: Diagnosis not present

## 2020-05-29 DIAGNOSIS — I639 Cerebral infarction, unspecified: Secondary | ICD-10-CM | POA: Diagnosis not present

## 2020-05-29 LAB — CBC
HCT: 43.4 % (ref 36.0–46.0)
Hemoglobin: 14.5 g/dL (ref 12.0–15.0)
MCH: 32.7 pg (ref 26.0–34.0)
MCHC: 33.4 g/dL (ref 30.0–36.0)
MCV: 97.7 fL (ref 80.0–100.0)
Platelets: 234 10*3/uL (ref 150–400)
RBC: 4.44 MIL/uL (ref 3.87–5.11)
RDW: 12.4 % (ref 11.5–15.5)
WBC: 10.3 10*3/uL (ref 4.0–10.5)
nRBC: 0 % (ref 0.0–0.2)

## 2020-05-29 MED ORDER — AMLODIPINE BESYLATE 10 MG PO TABS: 10.0000 mg | ORAL_TABLET | Freq: Every day | ORAL | Status: DC

## 2020-05-29 MED ORDER — AMLODIPINE BESYLATE 10 MG PO TABS
10.0000 mg | ORAL_TABLET | Freq: Every day | ORAL | Status: DC
  Administered 2020-05-29 – 2020-05-31 (×3): 10 mg via ORAL
  Filled 2020-05-29 (×3): qty 1

## 2020-05-29 NOTE — Progress Notes (Signed)
Family Medicine Teaching Service Daily Progress Note Intern Pager: [phone]  Patient name: Donna [name] Medical record number: [id number] Date of birth: October 10, 1950 Age: 70 y.o. Gender: female  Primary Care Provider: Care, Donna [name] Total Access Consultants: Neurology  Code Status: Full Code   Pt Overview and Major Events to Date:  Hospital Day: 5 05/25/2020: admitted for Code Stroke   Assessment and Plan: Donna [name] is a 70 y.o. female who presented w/ L sided weakness.  PMHx s/f HTN, HLD, depression/anxiety, tobacco use.  Acute CVA Stable residual deficits. Plans for DC at discharge. CIR planning to follow up with family on 5/16.  Marland Kitchen PT/OT, dispo CIR  . Neurology, signed off.  o DAPT x 3 months, then ASA alone o HI statin  o O/P monitor  o F/u o/p in 6 weeks  . Continue rosuvastatin 20 mg  . SLP consulted for left facial weakness. Soft diet. Appreciate continued recs.   HTN Amlodipine started 5/13. Previously Amlodipine-benzepil 10-20 mg daily. 144/86 today.  . Increase to amlodipine 10 mg   HLD LDL goal <70.  Marland Kitchen Continue rosuvastatin 20 mg   Anxiety  Depression  Grief  Brother passed last week. Room to go up on Buspar if needed.  . Chaplain consulted (seen on 5/12) . Continue home Cymbalta 60 mg, BusPar 5 mg BID  . Trazadone 50 mg QHS  . Discuss increasing BuSpar   Tobacco Abuse  Smokes 1/2 PPD.   Smoking cessation  NRT PRN  Cough On 513, patient with increased cough and Flonase and inhalers were restarted.  Her chest x-ray was negative.  Today, her lungs are clear on exam with no concerns or worsening.  Albuterol every 2 hours as needed wheezing, shortness of breath  Flonase daily  Smoking cessation as above  Constipation  MiraLAX daily and senna daily.  FEN/GI: Soft diet   VTE prophylaxis: Lovenox 40 (CrCl>30)   Status is: Inpatient  Remains inpatient appropriate because:Unsafe d/c plan, Inpatient level of care appropriate due to  severity of illness and CIR   Dispo: The patient is from: Home              Anticipated d/c is to: CIR              Patient currently is not medically stable to d/c.   Difficult to place patient No  Subjective:  NAEO.   Objective: Temp:  [97.5 F (36.4 C)-99 F (37.2 C)] 98.4 F (36.9 C) (05/15 0346) Pulse Rate:  [63-80] 63 (05/15 0346) Cardiac Rhythm: Normal sinus rhythm (05/14 1900) Resp:  [16-18] 18 (05/15 0346) BP: (144-155)/(63-86) 144/86 (05/15 0346) SpO2:  [94 %-97 %] 94 % (05/15 0346) Intake/Output      05/14 0701 05/15 0700 05/15 0701 05/16 0700   P.O.     Total Intake     Urine 350    Total Output 350    Net -350             Physical Exam: General: NAD, non-toxic, well-appearing, sitting comfortably in bed   HEENT: [place]/AT. PERRLA. EOMI.  Cardiovascular: RRR, normal S1, S2. B/L 2+ RP. No BLEE Respiratory: CTAB. No IWOB.  Abdomen: + BS. NT, ND, soft to palpation.  Extremities: Warm and well perfused. Moving spontaneously.  Integumentary: No obvious rashes, lesions, trauma on general exam. Neuro: A & O x4. Mild left facial droop, slurred speech. 5/5 RLE/RUE exam. Decreased grip strength on left side. LUE 3/5, LLE 2/5 strength.   Laboratory: I  have personally read and reviewed all labs and imaging studies.  CBC: Recent Labs  Lab 05/25/20 1340 05/25/20 1349 05/26/20 0319 05/29/20 0622  WBC 11.4*  --  10.6* 10.3  NEUTROABS 6.7  --   --   --   HGB 15.3* 15.0 14.4 14.5  HCT 46.2* 44.0 43.1 43.4  MCV 99.6  --  98.4 97.7  PLT 261  --  247 234   CMP: Recent Labs  Lab 05/25/20 1340 05/25/20 1349 05/26/20 0319  NA 137 140 138  K 4.9 4.6 3.7  CL 109 110 110  CO2 24  --  21*  GLUCOSE 97 88 89  BUN 20 22 17   CREATININE 1.00 0.90 0.83  CALCIUM 9.1  --  9.1  ALBUMIN 3.3*  --   --    CBG: Recent Labs  Lab 05/25/20 1340  GLUCAP 98   Micro: Covid Negative   Imaging/Diagnostic Tests: DG Chest 1 View  Result Date: 05/27/2020 CLINICAL DATA:   Cough EXAM: CHEST  1 VIEW COMPARISON:  None. FINDINGS: Heart size and mediastinal contours are within normal limits. Lungs are clear. No pleural effusion or pneumothorax is seen. Osseous structures about the chest are unremarkable. IMPRESSION: No active disease. No evidence of pneumonia or pulmonary edema. Electronically Signed   By: Donna [name] M.D.   On: 05/27/2020 10:45   DG Swallowing Func-Speech Pathology  Result Date: 05/28/2020 Objective Swallowing Evaluation: Type of Study: MBS-Modified Barium Swallow Study  Patient Details Name: Donna [name] MRN: [id number] Date of Birth: 08/08/1950 Today's Date: 05/28/2020 Time: SLP Start Time (ACUTE ONLY): 1225 -SLP Stop Time (ACUTE ONLY): 1242 SLP Time Calculation (min) (ACUTE ONLY): 17 min Past Medical History: Past Medical History: Diagnosis Date . Allergy  . Anxiety  . Depression  . Hyperlipidemia   no meds  . Hypertension  Past Surgical History: Past Surgical History: Procedure Laterality Date . DENTAL SURGERY   . NSVD    x2 . TUBAL LIGATION  1976 HPI: Pt is a 70 y/o female presenting to ED via EMS on 5/11 with L sided weakness. 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 PMH including HTN, HLD, smoker, anxiety, and depression; BSE ordered d/t increased cough and weakness, concern for aspiration; Passed [facility] swallow screen;  05/25/20..  Subjective: "I have allergies and don't have my medicine" Assessment / Plan / Recommendation CHL IP CLINICAL IMPRESSIONS 05/28/2020 Clinical Impression Trace aspiration with thin barium due to mildy mistimed laryngeal closure was exhibited. Strength and ROM of structures/swallow was appropriate despite bony vertebrae at C4 not impeding epiglottic excursion. Laryngeal closure was intermittently delayed  leading to trace aspiration of thin using a straw with delayed sensation and reflexive cough not seen until after MBS completed when recording watched. Instance of aggregation of thin to pyriform sinuses. Given left facial weakness and mildly prolonged oral transit, potential for pocketing will downgrade to Dys 3, continue thin, no straws, pills whole in puree. Continue ST. SLP Visit Diagnosis Dysphagia, oropharyngeal phase (R13.12) Attention and concentration deficit following -- Frontal lobe and executive function deficit following -- Impact on safety and function Mild aspiration risk   CHL IP TREATMENT RECOMMENDATION 05/28/2020 Treatment Recommendations Therapy as outlined in treatment plan below   Prognosis 05/28/2020 Prognosis for Safe Diet Advancement Good Barriers to Reach Goals --  Barriers/Prognosis Comment -- CHL IP DIET RECOMMENDATION 05/28/2020 SLP Diet Recommendations Dysphagia 3 (Mech soft) solids;Thin liquid Liquid Administration via Cup;No straw Medication Administration Whole meds with puree Compensations Minimize environmental distractions;Slow rate;Small sips/bites;Clear throat intermittently;Lingual sweep for clearance of pocketing Postural Changes Seated upright at 90 degrees   CHL IP OTHER RECOMMENDATIONS 05/28/2020 Recommended Consults -- Oral Care Recommendations Oral care BID Other Recommendations --   CHL IP FOLLOW UP RECOMMENDATIONS 05/28/2020 Follow up Recommendations Inpatient Rehab   CHL IP FREQUENCY AND DURATION 05/28/2020 Speech Therapy Frequency (ACUTE ONLY) min 2x/week Treatment Duration 2 weeks      CHL IP ORAL PHASE 05/28/2020 Oral Phase Impaired Oral - Pudding Teaspoon -- Oral - Pudding Cup -- Oral - Honey Teaspoon -- Oral - Honey Cup -- Oral - Nectar Teaspoon -- Oral - Nectar Cup -- Oral - Nectar Straw -- Oral - Thin Teaspoon -- Oral - Thin Cup WFL Oral - Thin Straw WFL Oral - Puree -- Oral - Mech Soft -- Oral - Regular Delayed oral transit Oral - Multi-Consistency -- Oral - Pill WFL  Oral Phase - Comment --  CHL IP PHARYNGEAL PHASE 05/28/2020 Pharyngeal Phase Impaired Pharyngeal- Pudding Teaspoon -- Pharyngeal -- Pharyngeal- Pudding Cup -- Pharyngeal -- Pharyngeal- Honey Teaspoon -- Pharyngeal -- Pharyngeal- Honey Cup -- Pharyngeal -- Pharyngeal- Nectar Teaspoon -- Pharyngeal -- Pharyngeal- Nectar Cup -- Pharyngeal -- Pharyngeal- Nectar Straw -- Pharyngeal -- Pharyngeal- Thin Teaspoon -- Pharyngeal -- Pharyngeal- Thin Cup Pharyngeal residue - valleculae Pharyngeal -- Pharyngeal- Thin Straw Penetration/Aspiration during swallow;Delayed swallow initiation-pyriform sinuses Pharyngeal Material enters airway, passes BELOW cords and not ejected out despite cough attempt by patient;Material enters airway, passes BELOW cords without attempt by patient to eject out (silent aspiration) Pharyngeal- Puree -- Pharyngeal -- Pharyngeal- Mechanical Soft -- Pharyngeal -- Pharyngeal- Regular WFL Pharyngeal -- Pharyngeal- Multi-consistency -- Pharyngeal -- Pharyngeal- Pill WFL Pharyngeal -- Pharyngeal Comment --  CHL IP CERVICAL ESOPHAGEAL PHASE 05/28/2020 Cervical Esophageal Phase WFL Pudding Teaspoon -- Pudding Cup -- Honey Teaspoon -- Honey Cup -- Nectar Teaspoon -- Nectar Cup -- Nectar Straw -- Thin Teaspoon -- Thin Cup -- Thin Straw -- Puree -- Mechanical Soft -- Regular -- Multi-consistency -- Pill -- Cervical Esophageal Comment -- [place] Siren 05/28/2020, 2:12 PM  Orbie Pyo Litaker M.Ed Actor Pager [phone] Office [phone]             Procedures:   Procedure Orders     ED EKG     EKG 12-Lead     EKG     ECHOCARDIOGRAM COMPLETE  Wilber Oliphant, MD 05/29/2020, 7:35 AM PGY-3, [place] Intern pager: [phone], text pages welcome

## 2020-05-30 DIAGNOSIS — I639 Cerebral infarction, unspecified: Secondary | ICD-10-CM | POA: Diagnosis not present

## 2020-05-30 DIAGNOSIS — I10 Essential (primary) hypertension: Secondary | ICD-10-CM | POA: Diagnosis not present

## 2020-05-30 MED ORDER — SENNA 8.6 MG PO TABS
1.0000 | ORAL_TABLET | Freq: Two times a day (BID) | ORAL | Status: DC
  Administered 2020-05-30 – 2020-05-31 (×2): 8.6 mg via ORAL
  Filled 2020-05-30 (×2): qty 1

## 2020-05-30 MED ORDER — POLYETHYLENE GLYCOL 3350 17 G PO PACK
17.0000 g | PACK | Freq: Two times a day (BID) | ORAL | Status: DC
  Administered 2020-05-30: 17 g via ORAL
  Filled 2020-05-30: qty 1

## 2020-05-30 NOTE — Care Management Important Message (Signed)
Important Message  Patient Details  Name: Donna [name] MRN: [id number] Date of Birth: 17-Jul-1950   Medicare Important Message Given:  Yes     Orbie Pyo 05/30/2020, 2:46 PM

## 2020-05-30 NOTE — TOC Initial Note (Addendum)
Transition of Care [facility]) - Initial/Assessment Note    Patient Details  Name: Donna [name] MRN: [id number] Date of Birth: 1950/06/26  [facility] CM/SW Contact:    Pollie Friar, RN Phone Number: 05/30/2020, 3:36 PM  Clinical Narrative:                 CM spoke to patients daughter: Elmyra Ricks. She feels they most likely are leaning toward SNF rehab. She will be at the hospital later today to talk with the patient. She gave permission for patient to be faxed out in the [facility] area.  Information faxed to [place] that was requested for [facility] number.  CM will attempt to see patients daughter when she arrives to the hospital. [facility] following.  1700: Daughter selected [facility]. CM notified the facility and they will have a bed tomorrow. CM asked MD for covid test.  Pt has has 2 vaccines and booster.   Expected Discharge Plan: Skilled Nursing Facility Barriers to Discharge: Continued Medical Work up   Patient Goals and CMS Choice   CMS Medicare.gov Compare Post Acute Care list provided to:: Patient Choice offered to / list presented to : [place]  Expected Discharge Plan and Services Expected Discharge Plan: [place] In-house Referral: Clinical Social Work Discharge Planning Services: CM Consult Post Acute Care Choice: [place] arrangements for the past 2 months: [place]                                      Prior Living Arrangements/Services Living arrangements for the past 2 months: Single Family Home Lives with:: Self Patient language and need for interpreter reviewed:: Yes Do you feel safe going back to the place where you live?: Yes      Need for Family Participation in Patient Care: Yes (Comment) Care giver support system in place?: No (comment)   Criminal Activity/Legal Involvement Pertinent to Current Situation/Hospitalization: No - Comment as needed  Activities of  Daily Living      Permission Sought/Granted                  Emotional Assessment Appearance:: Appears stated age Attitude/Demeanor/Rapport: Engaged Affect (typically observed): Accepting Orientation: : Oriented to Self,Oriented to Place,Oriented to  Time,Oriented to Situation   Psych Involvement: No (comment)  Admission diagnosis:  Stroke [facility]) [I63.9] Cerebrovascular accident (CVA), unspecified mechanism ([place]) [I63.9] Patient Active Problem List   Diagnosis Date Noted  . Cough   . Cigarette nicotine dependence without complication   . Stroke ([place]) 05/25/2020  . Primary hypertension    PCP:  Care, Jinny Blossom Total Access Pharmacy:   [facility] [phone] - [place], [address] [place] [address] Phone: [phone] Fax: [phone]     Social Determinants of Health (SDOH) Interventions    Readmission Risk Interventions No flowsheet data found.

## 2020-05-30 NOTE — Progress Notes (Signed)
Inpatient Rehabilitation Admissions Coordinator  I contacted patient's daughter, Darden Amber, to clarify caregiver supports that we discussed Friday that patient is projected she will need after any rehab stay. Daughter to discuss with her brother and call me back. I am not yet pursing CIR admit until caregiver supports can be verified.  Danne Baxter, RN, MSN Rehab Admissions Coordinator [phone] 05/30/2020 12:22 PM

## 2020-05-30 NOTE — Progress Notes (Signed)
OT Cancellation Note  Patient Details Name: Donna [name] MRN: [id number] DOB: [birth year]   Cancelled Treatment:    Reason Eval/Treat Not Completed: Other (comment) (Pt requesting to finish dinner.) Will return as schedule allows.  [place], OTR/L Acute Rehab Pager: [phone] Office: [phone] 05/30/2020, 5:08 PM

## 2020-05-30 NOTE — Progress Notes (Signed)
Re: Donna [name] DOB: 1950-02-09 Date: 05/30/2020   To Whom It May Concern:  Please be advised that the above-named patient will require a short-term nursing home stay--anticipated 30 days or less for rehabilitation and strengthening. The plan is for home.

## 2020-05-30 NOTE — Progress Notes (Signed)
Physical Therapy Treatment Patient Details Name: Florinda Taflinger MRN: [id number] DOB: [birth year] Today's Date: 05/30/2020    History of Present Illness Pt is 70 yo female presenting to ED via EMS on 5/11 with L sided weakness. 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 PMH including HTN, HLD, smoker, anxiety, and depression.    PT Comments    Pt was seen for mobility on RW with pt being unable to support on LUE.  May be better with a platform attachment, but is not strong and focused enough to get supported through L side.  Did direct assistance and was able to stand with knee support on LLE.  Pt is possibly distracted but did report two people assisted her last visit.  Follow up with more help and focus visit on correction of WB on L side with AD, using support directly to L knee for containing the effort to stand and walk.  Follow for acute PT goals.   Follow Up Recommendations  CIR     Equipment Recommendations  Rolling walker with 5" wheels    Recommendations for Other Services Rehab consult     Precautions / Restrictions Precautions Precautions: Fall Precaution Comments: L hemi Restrictions Weight Bearing Restrictions: No    Mobility  Bed Mobility Overal bed mobility: Needs Assistance Bed Mobility: Supine to Sit;Sit to Supine     Supine to sit: Min assist Sit to supine: Min assist   General bed mobility comments: min to assist her trunk and legs    Transfers Overall transfer level: Needs assistance Equipment used: Rolling walker (2 wheeled) Transfers: Sit to/from Stand Sit to Stand: Mod assist         General transfer comment: mod assist from lower bed surface with repetitive cues about sequence of set up and follow through and  discouraging reaching over to grab another surface  Ambulation/Gait Ambulation/Gait assistance: Max assist           General Gait Details: unable to walk with one person, too impulsive to sidestep as she will sit suddenly   Stairs             Wheelchair Mobility    Modified Rankin (Stroke Patients Only) Modified Rankin (Stroke Patients Only) Pre-Morbid Rankin Score: No symptoms Modified Rankin: Moderately severe disability     Balance Overall balance assessment: Needs assistance Sitting-balance support: Feet supported Sitting balance-Leahy Scale: Fair     Standing balance support: Bilateral upper extremity supported;During functional activity Standing balance-Leahy Scale: Poor                              Cognition Arousal/Alertness: Awake/alert Behavior During Therapy: Impulsive Overall Cognitive Status: No family/caregiver present to determine baseline cognitive functioning Area of Impairment: Safety/judgement;Awareness;Attention;Problem solving                   Current Attention Level: Selective   Following Commands: Follows one step commands inconsistently;Follows one step commands with increased time Safety/Judgement: Decreased awareness of safety;Decreased awareness of deficits Awareness: Intellectual Problem Solving: Slow processing;Requires verbal cues;Requires tactile cues General Comments: remidners for the plan, tends to forget safety and reach away from secure surface to reach for another surface      Exercises Total Joint Exercises Ankle Circles/Pumps:  AAROM;5 reps Heel Slides: AAROM;10 reps Hip ABduction/ADduction: AAROM;10 reps General Exercises - Lower Extremity Heel Slides: AAROM;10 reps Hip Flexion/Marching: AROM;AAROM;10 reps    General Comments        Pertinent Vitals/Pain Pain Assessment: No/denies pain    Home Living                      Prior Function            PT Goals (current goals  can now be found in the care plan section) Acute Rehab PT Goals Patient Stated Goal: "Get better"    Frequency    Min 4X/week      PT Plan Current plan remains appropriate    Co-evaluation              AM-PAC PT "6 Clicks" Mobility   Outcome Measure  Help needed turning from your back to your side while in a flat bed without using bedrails?: A Little Help needed moving from lying on your back to sitting on the side of a flat bed without using bedrails?: A Little Help needed moving to and from a bed to a chair (including a wheelchair)?: A Lot Help needed standing up from a chair using your arms (e.g., wheelchair or bedside chair)?: A Lot Help needed to walk in hospital room?: A Lot Help needed climbing 3-5 steps with a railing? : Total 6 Click Score: 13    End of Session Equipment Utilized During Treatment: Gait belt Activity Tolerance: Patient tolerated treatment well Patient left: with call bell/phone within reach;in bed;with bed alarm set Nurse Communication: Mobility status PT Visit Diagnosis: Other abnormalities of gait and mobility (R26.89);Muscle weakness (generalized) (M62.81);Hemiplegia and hemiparesis;Difficulty in walking, not elsewhere classified (R26.2) Hemiplegia - Right/Left: Left Hemiplegia - dominant/non-dominant: Non-dominant Hemiplegia - caused by: Cerebral infarction     Time: 3716-9678 PT Time Calculation (min) (ACUTE ONLY): 22 min  Charges:  $Therapeutic Activity: 8-22 mins                   Ramond Dial 05/30/2020, 3:47 PM  Mee Hives, PT MS Acute Rehab Dept. Number: [place] and [place]

## 2020-05-30 NOTE — NC FL2 (Signed)
[place] LEVEL OF CARE SCREENING TOOL     IDENTIFICATION  Patient Name: Donna [name] Birthdate: 21-Jun-1950 Sex: female Admission Date (Current Location): 05/25/2020  [facility] and [place] Number:  Herbalist and Address:  [facility], [place] [address] [place], [place] [address]      Provider Number: [id number]  Attending Physician Name and Address:  Lenoria Chime, MD  Relative Name and Phone Number:       Current Level of Care: Hospital Recommended Level of Care: [place] Prior Approval Number:    Date Approved/Denied:   PASRR Number: pending  Discharge Plan: SNF    Current Diagnoses: Patient Active Problem List   Diagnosis Date Noted  . Cough   . Cigarette nicotine dependence without complication   . Stroke ([place]) 05/25/2020  . Primary hypertension     Orientation RESPIRATION BLADDER Height & Weight     Self,Time,Situation,Place  Normal Continent Weight: 73.5 kg Height:     BEHAVIORAL SYMPTOMS/MOOD NEUROLOGICAL BOWEL NUTRITION STATUS      Continent Diet (soft diet with thin liquids)  AMBULATORY STATUS COMMUNICATION OF NEEDS Skin   Extensive Assist Verbally Normal                       Personal Care Assistance Level of Assistance  Bathing,Feeding,Dressing Bathing Assistance: Maximum assistance Feeding assistance: Limited assistance Dressing Assistance: Maximum assistance     Functional Limitations Info  Sight,Hearing,Speech Sight Info: Impaired Hearing Info: Adequate Speech Info: Adequate    SPECIAL CARE FACTORS FREQUENCY  PT (By licensed PT),OT (By licensed OT),Speech therapy     PT Frequency: 5x/wk OT Frequency: 5x/wk     Speech Therapy Frequency: 5x/wk      Contractures Contractures Info: Not present    Additional Factors Info  Code Status,Allergies,Psychotropic Code Status Info: Full Allergies Info: NKA Psychotropic Info: Buspar 5 mg BID/ Cymbalta DR 60 mg daily/  Trazadone 50 mg at bedtime         Current Medications (05/30/2020):  This is the current hospital active medication list Current Facility-Administered Medications  Medication Dose Route Frequency Provider Last Rate Last Admin  . albuterol (VENTOLIN HFA) 108 (90 Base) MCG/ACT inhaler 2 puff  2 puff Inhalation Q4H PRN Espinoza, Alejandra, DO      . amLODipine (NORVASC) tablet 10 mg  10 mg Oral Daily Pham, Minh Q, RPH-CPP   10 mg at 05/30/20 1022  . aspirin EC tablet 81 mg  81 mg Oral Daily Espinoza, Alejandra, DO   81 mg at 05/30/20 1021  . busPIRone (BUSPAR) tablet 5 mg  5 mg Oral BID Espinoza, Alejandra, DO   5 mg at 05/30/20 1021  . clopidogrel (PLAVIX) tablet 75 mg  75 mg Oral Daily Espinoza, Alejandra, DO   75 mg at 05/30/20 1021  . DULoxetine (CYMBALTA) DR capsule 60 mg  60 mg Oral Daily Espinoza, Alejandra, DO   60 mg at 05/30/20 1022  . enoxaparin (LOVENOX) injection 40 mg  40 mg Subcutaneous Q24H Paige, Victoria J, DO   40 mg at 05/29/20 2019  . fluticasone (FLONASE) 50 MCG/ACT nasal spray 1 spray  1 spray Each Nare Daily Espinoza, Alejandra, DO   1 spray at 05/30/20 1022  . nicotine (NICODERM CQ - dosed in mg/24 hr) patch 7 mg  7 mg Transdermal Daily PRN Dagar, Meredith Staggers, MD   7 mg at 05/25/20 2315  . polyethylene glycol (MIRALAX / GLYCOLAX) packet 17  g  17 g Oral Daily Espinoza, Alejandra, DO   17 g at 05/30/20 1023  . rosuvastatin (CRESTOR) tablet 20 mg  20 mg Oral Daily Espinoza, Alejandra, DO   20 mg at 05/30/20 1021  . senna (SENOKOT) tablet 8.6 mg  1 tablet Oral Daily Meccariello, Bailey J, DO   8.6 mg at 05/30/20 1022  . traZODone (DESYREL) tablet 50 mg  50 mg Oral QHS Dagar, Meredith Staggers, MD   50 mg at 05/29/20 2020     Discharge Medications: Please see discharge summary for a list of discharge medications.  Relevant Imaging Results:  Relevant Lab Results:   Additional Information SS#: [id number]  Pollie Friar, RN

## 2020-05-30 NOTE — Plan of Care (Signed)
  Problem: Education: Goal: Knowledge of disease or condition will improve Outcome: Progressing Goal: Knowledge of secondary prevention will improve Outcome: Progressing Goal: Knowledge of patient specific risk factors addressed and post discharge goals established will improve Outcome: Progressing   Problem: Coping: Goal: Will verbalize positive feelings about self Outcome: Progressing Goal: Will identify appropriate support needs Outcome: Progressing   Problem: Intracerebral Hemorrhage Tissue Perfusion: Goal: Complications of Intracerebral Hemorrhage will be minimized Outcome: Progressing   Problem: Ischemic Stroke/TIA Tissue Perfusion: Goal: Complications of ischemic stroke/TIA will be minimized Outcome: Progressing

## 2020-05-30 NOTE — Progress Notes (Signed)
Family Medicine Teaching Service Daily Progress Note Intern Pager: [phone]  Patient name: Donna [name] Medical record number: [id number] Date of birth: [birth year] Age: 70 y.o. Gender: female  Primary Care Provider: Care, Jinny Blossom Total Access Consultants: Neurology (s/o) Code Status: Full  Pt Overview and Major Events to Date:  Donna [name] is a 70 y.o. female who presented w/ L sided weakness found to have acute CVA.  PMHx s/f HTN, HLD, depression/anxiety, tobacco use. Medically stable for discharge to CIR.   Acute CVA Stable L sided residual deficits. CIR planning to follow up with family today.  - PT/OT, dispo CIR  - Neurology, signed off.  - DAPT x 3 months, then ASA alone - HI statin  - O/P monitor  - F/u o/p in 6 weeks  - Continue rosuvastatin 20 mg  - SW assisting with placement   HTN BP this am: 130/75. Amlodipine started 5/13. Previously Amlodipine-benzepil 10-20 mg daily. - Amlodipine 10 mg   HLD LDL goal <70.  - Continue rosuvastatin 20 mg   Anxiety  Depression  Grief  Brother passed last week. Room to go up on Buspar if needed.  - Chaplain consulted (seen on 5/12) - Continue home Cymbalta 60 mg, BusPar 5 mg BID  - Trazadone 50 mg QHS   Tobacco Abuse  Smokes 1/2 PPD.  - Smoking cessation - NRT PRN 7mg    Constipation Patient reports last BM 4 days ago  - MiraLAX and senna BID.  FEN/GI: Soft diet   VTE prophylaxis: Lovenox 40   Status is: Inpatient  Remains inpatient appropriate because:Unsafe d/c plan, Inpatient level of care appropriate due to severity of illness and CIR  Dispo: The patient is from: Home  Anticipated d/c is to: CIR  Patient currently is not medically stable to d/c.              Difficult to place patient No  Subjective:  No acute events overnight. She seemed hopeful as she was showing me how she can move her arm off of the bed. States she slept well last night. Does endorse  constipation and states her last BM was 4 days ago.   Objective: Temp:  [97.8 F (36.6 C)-98.6 F (37 C)] 98.6 F (37 C) (05/16 0421) Pulse Rate:  [68-72] 68 (05/16 0421) Resp:  [18-20] 19 (05/16 0756) BP: (116-161)/(62-75) 130/75 (05/16 0756) SpO2:  [95 %-99 %] 97 % (05/16 0421) Weight:  [73.5 kg] 73.5 kg (05/16 0500) Physical Exam: General: alert, sitting in bed, NAD Cardiovascular: RRR no urmurs Respiratory: CTAB normal WOB Abdomen: soft, non distended, non tender Extremities: warm, dry. No LE edema Neuro: A&O x4. Mild left facial droop. 5/5 strength RUE and RLE. 3/5 strength LUE, 2/5 strength LLE.   Laboratory: Recent Labs  Lab 05/25/20 1340 05/25/20 1349 05/26/20 0319 05/29/20 0622  WBC 11.4*  --  10.6* 10.3  HGB 15.3* 15.0 14.4 14.5  HCT 46.2* 44.0 43.1 43.4  PLT 261  --  247 234   Recent Labs  Lab 05/25/20 1340 05/25/20 1349 05/26/20 0319  NA 137 140 138  K 4.9 4.6 3.7  CL 109 110 110  CO2 24  --  21*  BUN 20 22 17   CREATININE 1.00 0.90 0.83  CALCIUM 9.1  --  9.1  PROT 6.2*  --   --   BILITOT 0.3  --   --   ALKPHOS 63  --   --   ALT 17  --   --  AST 22  --   --   GLUCOSE 97 88 89    Imaging/Diagnostic Tests: None new  Shary Key, DO 05/30/2020, 7:56 AM PGY-1, [place] Intern pager: [phone], text pages welcome

## 2020-05-31 LAB — SARS CORONAVIRUS 2 (TAT 6-24 HRS): SARS Coronavirus 2: NEGATIVE

## 2020-05-31 MED ORDER — NICOTINE 7 MG/24HR TD PT24: 7.0000 mg | MEDICATED_PATCH | Freq: Every day | TRANSDERMAL | 0 refills | Status: DC | PRN

## 2020-05-31 MED ORDER — AMLODIPINE BESYLATE 10 MG PO TABS: 10.0000 mg | ORAL_TABLET | Freq: Every day | ORAL | Status: DC

## 2020-05-31 MED ORDER — ASPIRIN 81 MG PO TBEC: 81.0000 mg | DELAYED_RELEASE_TABLET | Freq: Every day | ORAL | 11 refills | Status: AC

## 2020-05-31 MED ORDER — CLOPIDOGREL BISULFATE 75 MG PO TABS
75.0000 mg | ORAL_TABLET | Freq: Every day | ORAL | Status: AC
Start: 2020-05-31 — End: ?

## 2020-05-31 MED ORDER — ROSUVASTATIN CALCIUM 20 MG PO TABS: 20.0000 mg | ORAL_TABLET | Freq: Every day | ORAL | Status: AC

## 2020-05-31 NOTE — Progress Notes (Signed)
Occupational Therapy Treatment Patient Details Name: Donna [name] MRN: [id number] DOB: [birth year] Today's Date: 05/31/2020    History of present illness Pt is 70 yo female presenting to ED via EMS on 5/11 with L sided weakness. 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 PMH including HTN, HLD, smoker, anxiety, and depression.   OT comments  Pt progressing well with L visual field, strength on L side and ability to follow commands and use L side for movement. Pt requires hand over hand for LUE to stabilize on RW; Pt's vision seems to have improved since eval. Pt occluding R eye and only using L eye to tell time on clock and date with no difficulty; pt locating items on L side with no physical assist. Pt progressing with grooming tasks in sitting today. Pt light ROM to LUE. Pt would greatly benefit from continued OT skilled services. Needs in reach.    Follow Up Recommendations  SNF    Equipment Recommendations  3 in 1 bedside commode    Recommendations for Other Services      Precautions / Restrictions Precautions Precautions: Fall;Other (comment) Precaution Comments: L hemi       Mobility Bed Mobility Overal bed mobility: Needs Assistance Bed Mobility: Supine to Sit     Supine to sit: Min assist     General bed mobility comments: assist with LLE OOB, assist to elevate trunk    Transfers Overall transfer level: Needs assistance Equipment used: Rolling walker (2 wheeled) Transfers: Sit to/from [facility] Sit to Stand: Mod assist;+2 physical assistance;+2 safety/equipment Stand pivot transfers: +2 physical assistance;+2 safety/equipment;Mod assist       General transfer comment: assist to power up and stabilize balance, hand  over hand on L to facilitate grip    Balance Overall balance assessment: Needs assistance Sitting-balance support: Feet supported;Single extremity supported Sitting balance-Leahy Scale: Fair     Standing balance support: Bilateral upper extremity supported;During functional activity Standing balance-Leahy Scale: Poor Standing balance comment: reliant on external support                           ADL either performed or assessed with clinical judgement   ADL Overall ADL's : Needs assistance/impaired Eating/Feeding: Set up;Sitting Eating/Feeding Details (indicate cue type and reason): food cut up for pt Grooming: Minimal assistance;Sitting Grooming Details (indicate cue type and reason): set-upA to minA for grooming task in recliner             Lower Body Dressing: Moderate assistance;Cueing for safety;Sitting/lateral leans Lower Body Dressing Details (indicate cue type and reason): assist with figure 4 and to start donning socks.             Functional mobility during ADLs: Moderate assistance;Rolling walker;Cueing for safety General ADL Comments: Pt's vision seems to have improved since eval. Pt occluding R eye and only using L eye to tell time on clock and date with no difficulty; pt locating items on L side with no added cues.     Vision   Vision Assessment?: Yes Eye Alignment: Within Functional Limits Ocular Range of Motion: Within Functional Limits Alignment/Gaze Preference: Within Defined Limits Tracking/Visual Pursuits: Able to track stimulus in all quads without difficulty Additional Comments: tracking, scanning and moving head to L  and R well without cues.   Perception     Praxis      Cognition Arousal/Alertness: Awake/alert Behavior During Therapy: WFL for tasks assessed/performed Overall Cognitive Status: No family/caregiver present to determine baseline cognitive functioning Area of Impairment: Safety/judgement;Awareness;Attention;Problem  solving;Following commands                   Current Attention Level: Selective   Following Commands: Follows one step commands consistently;Follows one step commands with increased time Safety/Judgement: Decreased awareness of safety;Decreased awareness of deficits Awareness: Emergent Problem Solving: Difficulty sequencing;Requires verbal cues;Requires tactile cues General Comments: Enthusiastic about mobility        Exercises Exercises: Other exercises Other Exercises Other Exercises: L visual scanning tasks Other Exercises: L AAROM/PROM to LUE   Shoulder Instructions       General Comments Pt very motivated to return to PLOF.    Pertinent Vitals/ Pain       Pain Assessment: No/denies pain  Home Living                                          Prior Functioning/Environment              Frequency  Min 2X/week        Progress Toward Goals  OT Goals(current goals can now be found in the care plan section)  Progress towards OT goals: Progressing toward goals  Acute Rehab OT Goals Patient Stated Goal: rehab then home OT Goal Formulation: With patient Time For Goal Achievement: 06/09/20 Potential to Achieve Goals: Good ADL Goals Pt Will Perform Grooming: with min guard assist;standing Pt Will Perform Upper Body Dressing: with min guard assist;sitting Pt Will Transfer to Toilet: with min assist;ambulating;bedside commode Pt Will Perform Toileting - Clothing Manipulation and hygiene: with min guard assist;sitting/lateral leans;sit to/from stand Additional ADL Goal #1: Pt will locate 4/5 ADL items in left visual field during ADLs with Min cues  Plan Discharge plan remains appropriate    Co-evaluation    PT/OT/SLP Co-Evaluation/Treatment: Yes Reason for Co-Treatment: Complexity of the patient's impairments (multi-system involvement);To address functional/ADL transfers   OT goals addressed during session: ADL's and  self-care;Strengthening/ROM      AM-PAC OT "6 Clicks" Daily Activity     Outcome Measure   Help from another person eating meals?: A Little Help from another person taking care of personal grooming?: A Little Help from another person toileting, which includes using toliet, bedpan, or urinal?: A Lot Help from another person bathing (including washing, rinsing, drying)?: A Lot Help from another person to put on and taking off regular upper body clothing?: A Lot Help from another person to put on and taking off regular lower body clothing?: A Lot 6 Click Score: 14    End of Session Equipment Utilized During Treatment: Rolling walker;Gait belt  OT Visit Diagnosis: Unsteadiness on feet (R26.81);Other abnormalities of gait and mobility (R26.89);Muscle weakness (generalized) (M62.81);Hemiplegia and hemiparesis Hemiplegia - Right/Left: Left Hemiplegia - dominant/non-dominant: Non-Dominant Hemiplegia - caused by: Cerebral infarction   Activity Tolerance Patient tolerated treatment well   Patient Left in chair;with call bell/phone within reach;with chair alarm set   Nurse Communication Mobility status        Time: 3244-0102 OT Time Calculation (min): 35 min  Charges: OT General Charges $OT Visit: 1 Visit OT Treatments $Self Care/Home Management : 8-22 mins  Jefferey Pica, OTR/L Acute Rehabilitation  Services Pager: [phone] Office: [phone]    Hance Caspers C 05/31/2020, 5:28 PM

## 2020-05-31 NOTE — Progress Notes (Addendum)
Called  [phone]  to give report, was transferred to nurse and line keeps ringing, no answer. This is the second time calling facility and nurse is not able to take report. Will try again shortly.

## 2020-05-31 NOTE — TOC Transition Note (Signed)
Transition of Care [facility]) - CM/SW Discharge Note   Patient Details  Name: Donna [name] MRN: [id number] Date of Birth: 03-18-1950  [facility] CM/SW Contact:  Pollie Friar, RN Phone Number: 05/31/2020, 12:14 PM   Clinical Narrative:    Patient is discharging to [facility] today. Pt will transport via [facility].  Bedside RN updated and d/c packet at the desk.   Room: 122 Number for report: [phone]   Final next level of care: Skilled Nursing Facility Barriers to Discharge: No Barriers Identified   Patient Goals and CMS Choice   CMS Medicare.gov Compare Post Acute Care list provided to:: Patient Choice offered to / list presented to : [place]  Discharge Placement PASRR number recieved: 05/30/20            Patient chooses bed at: [facility] Patient to be transferred to facility by: [place] Name of family member notified: Elmyra Ricks Patient and family notified of of transfer: 05/31/20  Discharge Plan and Services In-house Referral: Clinical Social Work Discharge Planning Services: [employer] Consult Post Acute Care Choice: [place]                               Social Determinants of Health (SDOH) Interventions     Readmission Risk Interventions No flowsheet data found.

## 2020-05-31 NOTE — Progress Notes (Addendum)
[facility] arrived to take patient to [facility]. Patient belongings and discharge packet were given to patient. Patient attempted to call daughter.

## 2020-05-31 NOTE — Progress Notes (Signed)
  Speech Language Pathology Treatment: Dysphagia;Cognitive-Linquistic  Patient Details Name: Donna [name] MRN: [id number] DOB: 02-25-1950 Today's Date: 05/31/2020 Time: 5277-8242 SLP Time Calculation (min) (ACUTE ONLY): 30 min  Assessment / Plan / Recommendation Clinical Impression  Pt seen today to assess po tolerance, readiness for dietary advancement, etc s/p MBS.  Cough observed x1 when consuming water via cup - no cough observed with water via straw x5.  Pt reports improved oral control with use of straw- and although pt aspirated minimally with thin via straw, clinical performance with mod I cues  for small boluses showed improved function via straw therefore advise lift straw restrictions.  Using teach back, pt educated and agreeable to plan.   Pt admits to baseline congested cough due to her allergies - but does not coorelate cough with consumption of liquids PTA.   Reviewed with pt need to brush her gums/tongue and remove and clean dentures nightly to decrease bacterial load.    Pt remains mildly dysarthric and SLP provided mod cue for pt to turn off tv/background noise using teach back to improve listener comprehension.    Pt discussed concerns for her eye discomfort - therefore SLP provided her with paper/pencil to write for recall to tell MD.  Pt then stated - "I don't need that, I just put that in "Notes" secretion of my phone.  SLP and pt then attempted to locate phone - but could not find it in her room.  SLP called phone several times but it went to voice mail without ringing.  Pt used higher level problem solving independently - stating her daughter could locate her phone - and SlP had her use SLPs cell phone to call daughter.  It was ultimately found by Lattie Haw, NT in laundry - but Games developer is missing.  Pt reported gratitude for phone being located.   Pt has made significant progress with her understanding of cognitive linguistic goals/compensations and dysphagia management.    HPI  HPI: Pt is a 70 y/o female presenting to ED via EMS on 5/11 with L sided weakness. 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 PMH including HTN, HLD, smoker, anxiety, and depression; BSE ordered d/t increased cough and weakness, concern for aspiration; Passed Yale swallow screen;  05/25/20 but was noted to have some coughing with intake and with some congestion.  Pt is s/p MBS -      SLP Plan  Continue with current plan of care       Recommendations  Compensations: Minimize environmental distractions;Slow rate;Small sips/bites;Lingual sweep for clearance of pocketing Postural Changes and/or Swallow Maneuvers: Seated upright 90 degrees;Upright 30-60 min after meal                Oral Care Recommendations: Oral care BID Follow up Recommendations: Skilled Nursing facility SLP Visit Diagnosis: Dysphagia, oropharyngeal phase (R13.12) Plan: Continue with current plan of care       Clayton, Alexandre Faries Ann 05/31/2020, 1:16 PM   Kathleen Lime, MS [place] Office [phone] Pager [phone]

## 2020-05-31 NOTE — Progress Notes (Signed)
Inpatient Rehabilitation Admissions Coordinator  Noted plans for SNF. We will sign off at this time.  Danne Baxter, RN, MSN Rehab Admissions Coordinator [phone] 05/31/2020 8:29 AM

## 2020-05-31 NOTE — Progress Notes (Addendum)
Third time calling [phone]  to give report, was transferred to nurse and line keeps ringing, no answer. Case worker aware. Patient scheduled to arrive to facility within the hour.

## 2020-05-31 NOTE — Progress Notes (Signed)
Physical Therapy Treatment Patient Details Name: Donna [name] MRN: [id number] DOB: 10-31-1950 Today's Date: 05/31/2020    History of Present Illness Pt is 70 yo female presenting to ED via EMS on 5/11 with L sided weakness. 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 PMH including HTN, HLD, smoker, anxiety, and depression.    PT Comments    Pt very motivated to participate in therapy. She required min assist bed mobility, +2 mod assist transfers, and +2 mod assist ambulation 10' with RW. Required hand over hand assist on L to facilitate grip on RW. Pt in recliner at end of session with feet elevated. Discharge plan updated to SNF. 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    Follow Up Recommendations  SNF     Equipment Recommendations  Other (comment) (defer to post acute)    Recommendations for Other Services       Precautions / Restrictions Precautions Precautions: Fall;Other (comment) Precaution Comments: L hemi Restrictions Weight Bearing Restrictions: No    Mobility  Bed Mobility Overal bed mobility: Needs Assistance Bed Mobility: Supine to Sit     Supine to sit: Min assist     General bed mobility comments: assist with LLE OOB, assist to elevate trunk    Transfers Overall transfer level: Needs assistance Equipment used: Rolling walker (2 wheeled) Transfers: Sit to/from [facility] Sit to Stand: Mod assist;+2 physical assistance;+2 safety/equipment Stand pivot transfers: +2 physical assistance;+2 safety/equipment;Mod assist       General transfer comment: assist to power up and stabilize balance, hand over hand on L to facilitate grip  Ambulation/Gait Ambulation/Gait assistance: Mod  assist;+2 physical assistance;+2 safety/equipment Gait Distance (Feet): 10 Feet Assistive device: Rolling walker (2 wheeled) Gait Pattern/deviations: Step-to pattern;Decreased stance time - left;Decreased stride length;Decreased dorsiflexion - left;Decreased weight shift to left Gait velocity: very slow Gait velocity interpretation: <1.8 ft/sec, indicate of risk for recurrent falls General Gait Details: cues for sequencing, hand over hand on L to facilitate grip, assist with RW management and to maintain balance, +2 for chair follow   Stairs             Wheelchair Mobility    Modified Rankin (Stroke Patients Only) Modified Rankin (Stroke Patients Only) Pre-Morbid Rankin Score: No symptoms Modified Rankin: Moderately severe disability     Balance Overall balance assessment: Needs assistance Sitting-balance support: Feet supported;Single extremity supported Sitting balance-Leahy Scale: Fair     Standing balance support: Bilateral upper extremity supported;During functional activity Standing balance-Leahy Scale: Poor Standing balance comment: reliant on external support                            Cognition Arousal/Alertness: Awake/alert Behavior During Therapy: WFL for tasks assessed/performed Overall Cognitive Status: No family/caregiver present to determine baseline cognitive functioning Area of Impairment: Safety/judgement;Awareness;Attention;Problem solving;Following commands                   Current Attention Level: Selective   Following Commands: Follows one step commands consistently;Follows one step commands with increased time Safety/Judgement: Decreased awareness of safety;Decreased awareness of deficits Awareness: Emergent Problem Solving: Difficulty sequencing;Requires verbal cues;Requires tactile cues        Exercises      General  Comments        Pertinent Vitals/Pain Pain Assessment: No/denies pain    Home Living                       Prior Function            PT Goals (current goals can now be found in the care plan section) Acute Rehab PT Goals Patient Stated Goal: rehab then home Progress towards PT goals: Progressing toward goals    Frequency    Min 4X/week      PT Plan Discharge plan needs to be updated    Co-evaluation PT/OT/SLP Co-Evaluation/Treatment: Yes Reason for Co-Treatment: To address functional/ADL transfers;For patient/therapist safety PT goals addressed during session: Mobility/safety with mobility;Balance;Proper use of DME        AM-PAC PT "6 Clicks" Mobility   Outcome Measure  Help needed turning from your back to your side while in a flat bed without using bedrails?: A Little Help needed moving from lying on your back to sitting on the side of a flat bed without using bedrails?: A Little Help needed moving to and from a bed to a chair (including a wheelchair)?: A Lot Help needed standing up from a chair using your arms (e.g., wheelchair or bedside chair)?: A Lot Help needed to walk in hospital room?: A Lot Help needed climbing 3-5 steps with a railing? : Total 6 Click Score: 13    End of Session Equipment Utilized During Treatment: Gait belt Activity Tolerance: Patient tolerated treatment well Patient left: in chair;with call bell/phone within reach;with chair alarm set Nurse Communication: Mobility status PT Visit Diagnosis: Other abnormalities of gait and mobility (R26.89);Muscle weakness (generalized) (M62.81);Hemiplegia and hemiparesis;Difficulty in walking, not elsewhere classified (R26.2) Hemiplegia - Right/Left: Left Hemiplegia - dominant/non-dominant: Non-dominant Hemiplegia - caused by: Cerebral infarction     Time: 3500-9381 PT Time Calculation (min) (ACUTE ONLY): 23 min  Charges:  $Therapeutic Activity: 8-22 mins                     Lorrin Goodell, PT  Office # [phone] Pager [phone]    Lorriane Shire 05/31/2020, 11:56 AM

## 2020-05-31 NOTE — Discharge Instructions (Signed)
Dear Donna [name],   Thank you so much for allowing [place] to be part of your care!  You were admitted to [facility] for a stroke and I am glad you are improving!    POST-HOSPITAL & CARE INSTRUCTIONS 1. Please let PCP/Specialists know of any changes that were made.  2. Please see medications section of this packet for any medication changes.   DOCTOR'S APPOINTMENT & FOLLOW UP CARE INSTRUCTIONS  Future Appointments  Date Time Provider [place]  08/01/2020  9:40 AM Werner Lean, MD [facility]    RETURN PRECAUTIONS: Return for concern of another stroke - increased weakness, severe headache, difficulty breathing or other new and concerning symptoms.   Take care and be well!  [facility]  [place],  [address] [phone]

## 2020-05-31 NOTE — H&P (Incomplete)
Physical Medicine and Rehabilitation Admission H&P    Chief Complaint  Patient presents with  . Code Stroke  : HPI: Donna [name] is a 70 year old right-handed female with history of anxiety/depression, hyperlipidemia, hypertension and tobacco abuse.  Per chart review lives alone reportedly independent prior to admission and working as a Training and development officer.  Presented 05/25/2020 with acute onset of left-sided weakness.  Cranial CT scan showed no acute change.  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  Patient did not receive tPA.  MRI/MRI showed acute small infarct along the roof of the right temporal horn and head of the right hippocampus.  Acute or subacute punctate cortical right occipital and left temporal infarct.  Possible punctate subacute infarct of posterior limb of right internal capsule.  Chronic infarct right corona radiata.  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  Admission chemistries unremarkable except WBC 11,400.  Echocardiogram with ejection fraction of 65 to 70% no wall motion abnormalities.  Currently maintained on aspirin 81 mg daily and Plavix 75 mg daily for CVA prophylaxis x3 months followed by aspirin alone.  Subcutaneous Lovenox for DVT prophylaxis.  Tolerating a mechanical soft consistency diet.  Therapy evaluations completed due to patient's left-sided weakness was admitted for a comprehensive rehab program.  Review of Systems  Constitutional: Negative for chills and fever.  HENT: Negative for hearing loss.   Eyes: Negative for blurred vision and double vision.  Respiratory: Negative for cough and shortness of breath.   Cardiovascular: Negative for chest pain, palpitations and leg swelling.  Gastrointestinal: Positive for constipation. Negative for heartburn, nausea and vomiting.  Genitourinary: Negative for dysuria, flank pain and hematuria.  Musculoskeletal: Positive for myalgias.  Skin: Negative for rash.   Neurological: Positive for weakness.  Psychiatric/Behavioral: Positive for depression. The patient has insomnia.        Anxiety  All other systems reviewed and are negative.  Past Medical History:  Diagnosis Date  . Allergy   . Anxiety   . Depression   . Hyperlipidemia    no meds   . Hypertension    Past Surgical History:  Procedure Laterality Date  . DENTAL SURGERY    . NSVD     x2  . TUBAL LIGATION  1976   Family History  Problem Relation Age of Onset  . Colon cancer Mother        dx'd in her 61's   . Colon polyps Sister   . Esophageal cancer Sister   . Lung cancer Sister   . Breast cancer Sister   . Rectal cancer Neg Hx   . Stomach cancer Neg Hx   . Pancreatic cancer Neg Hx    Social History:  reports that she has been smoking. She has never used smokeless tobacco. She reports that she does not drink alcohol and does not use drugs. Allergies: No Known Allergies Facility-Administered Medications Prior to Admission  Medication Dose Route Frequency Provider Last Rate Last Admin  . 0.9 %  sodium chloride infusion  500 mL Intravenous Once Pyrtle, Lajuan Lines, MD       Medications Prior to Admission  Medication Sig Dispense Refill  . albuterol (VENTOLIN HFA) 108 (90 Base) MCG/ACT inhaler Inhale 2 puffs into the lungs every 4 (four) hours as needed for wheezing or shortness of breath.    Marland Kitchen amLODipine-benazepril (LOTREL) 10-20 MG capsule Take 1 capsule by mouth daily.    . busPIRone (BUSPAR) 5 MG tablet Take 5 mg by mouth  2 (two) times daily.  0  . DULoxetine (CYMBALTA) 60 MG capsule Take 60 mg by mouth daily.    . fluticasone (FLONASE) 50 MCG/ACT nasal spray Place 1 spray into both nostrils daily.    . montelukast (SINGULAIR) 10 MG tablet Take 10 mg by mouth at bedtime.  0  . traZODone (DESYREL) 50 MG tablet Take 50 mg by mouth at bedtime.  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  Functional Status:  Mobility: Bed Mobility Overal bed mobility: Needs Assistance Bed Mobility: Supine to Sit,Sit to Supine Supine to sit: Min assist Sit to supine: Min assist General bed mobility comments: min to assist her trunk and legs Transfers Overall transfer level: Needs assistance Equipment used: Rolling walker (2 wheeled) Transfers: Sit to/from Stand Sit to Stand: Mod assist Stand pivot transfers: Mod assist General transfer comment: mod assist from lower bed surface with repetitive cues about sequence of set up and follow through and discouraging reaching over to grab another surface Ambulation/Gait Ambulation/Gait assistance: Max assist Gait Distance (Feet): 6 Feet (x2) Assistive device: Rolling walker (2 wheeled) Gait Pattern/deviations: Step-to pattern,Decreased step length - right,Decreased step length - left,Decreased stance time - left,Decreased stride length,Decreased dorsiflexion - left,Decreased weight shift to left General Gait Details: unable to walk with one person, too impulsive to sidestep as she will sit suddenly Gait velocity: slow Gait velocity interpretation: <1.31 ft/sec, indicative of household ambulator    ADL: ADL Overall ADL's : Needs assistance/impaired Eating/Feeding: Minimal assistance,Sitting Eating/Feeding Details (indicate cue type and reason): Min A for bilateral coorindation Grooming: Minimal assistance,Sitting Upper Body Bathing: Moderate assistance,Sitting Lower Body Bathing: Maximal assistance,Sit to/from stand Upper Body Dressing : Moderate assistance,Sitting Lower Body Dressing: Maximal assistance,Sit to/from stand Toilet  Transfer: Moderate assistance,Ambulation,RW (simualted to recliner) Functional mobility during ADLs: Moderate assistance,Rolling walker General ADL Comments: Pt presenting with decreased functuional use of LUE, balance, strength, vision, cognition, and actiity tolerance  Cognition: Cognition Overall Cognitive Status: No family/caregiver present to determine baseline cognitive functioning Arousal/Alertness: Awake/alert Orientation Level: Oriented to person Attention: Sustained Sustained Attention: Impaired Sustained Attention Impairment: Functional basic Memory: Impaired Memory Impairment: Retrieval deficit Awareness: Appears intact Problem Solving: Impaired Safety/Judgment: Appears intact Comments: pt tearful re: her current stroke and requiring assistance stating she does not want to rely on anyone else, she provides care to others Cognition Arousal/Alertness: Awake/alert Behavior During Therapy: Impulsive Overall Cognitive Status: No family/caregiver present to determine baseline cognitive functioning Area of Impairment: Safety/judgement,Awareness,Attention,Problem solving Current Attention Level: Selective Following Commands: Follows one step commands inconsistently,Follows one step commands with increased time Safety/Judgement: Decreased awareness of safety,Decreased awareness of deficits Awareness: Intellectual Problem Solving: Slow processing,Requires verbal cues,Requires tactile cues General Comments: remidners for the plan, tends to forget safety and reach away from secure surface to reach for another surface  Physical Exam: Blood pressure (!) 141/54, pulse 64, temperature 98.1 F (36.7 C), temperature source Oral, resp. rate (!) 21, weight 73.5 kg, SpO2 97 %. Physical Exam Neurological:     Comments: Patient is alert in no acute distress.  Makes eye contact with examiner.  Speech is a bit dysarthric but intelligible.  She does exhibit some left inattention.  Provides name  age and date of birth.     No results found for this or any previous visit (from the past 48 hour(s)). No results found.     Medical Problem List and Plan: 1.  Left-sided weakness and dysarthria secondary to small infarct along the roof of the right temporal horn and head of the right hippocampus.  Punctate cortical right occipital and left temporal infarcts.  Possible punctate subacute infarct of posterior limb of right internal capsule.  -patient may *** shower  -ELOS/Goals: *** 2.  Antithrombotics: -DVT/anticoagulation: Lovenox  -antiplatelet therapy: Aspirin 81 mg daily and Plavix 75 mg daily x3 months then aspirin alone 3. Pain Management: Tylenol as needed 4. Mood: Cymbalta 60 mg daily, BuSpar 5 mg twice daily, trazodone 50 mg nightly  -antipsychotic agents: N/A 5. Neuropsych: This patient is capable of making decisions on her own behalf. 6. Skin/Wound Care: Routine skin checks 7. Fluids/Electrolytes/Nutrition: Routine in and outs with follow-up chemistries 8.  Hypertension.  Norvasc 10 mg daily.  Monitor with increased mobility 9.  Hyperlipidemia.  Crestor 10.  Tobacco abuse.  NicoDerm patch.  Provide counseling    ***  Cathlyn Parsons, PA-C 05/31/2020

## 2020-05-31 NOTE — Progress Notes (Signed)
Called  [phone] to give report, was transferred to nurse and line keeps ringing, no answer. Will try again shortly.

## 2020-05-31 NOTE — Plan of Care (Signed)
  Problem: Education: Goal: Knowledge of disease or condition will improve Outcome: Progressing Goal: Knowledge of secondary prevention will improve Outcome: Progressing Goal: Knowledge of patient specific risk factors addressed and post discharge goals established will improve Outcome: Progressing   Problem: Coping: Goal: Will verbalize positive feelings about self Outcome: Progressing Goal: Will identify appropriate support needs Outcome: Progressing   Problem: Health Behavior/Discharge Planning: Goal: Ability to manage health-related needs will improve Outcome: Progressing   Problem: Self-Care: Goal: Ability to participate in self-care as condition permits will improve Outcome: Progressing Goal: Verbalization of feelings and concerns over difficulty with self-care will improve Outcome: Progressing Goal: Ability to communicate needs accurately will improve Outcome: Progressing   Problem: Nutrition: Goal: Risk of aspiration will decrease Outcome: Progressing Goal: Dietary intake will improve Outcome: Progressing   Problem: Intracerebral Hemorrhage Tissue Perfusion: Goal: Complications of Intracerebral Hemorrhage will be minimized Outcome: Progressing   Problem: Ischemic Stroke/TIA Tissue Perfusion: Goal: Complications of ischemic stroke/TIA will be minimized Outcome: Progressing   Problem: Spontaneous Subarachnoid Hemorrhage Tissue Perfusion: Goal: Complications of Spontaneous Subarachnoid Hemorrhage will be minimized Outcome: Progressing   Problem: Education: Goal: Knowledge of General Education information will improve Description: Including pain rating scale, medication(s)/side effects and non-pharmacologic comfort measures Outcome: Progressing   Problem: Health Behavior/Discharge Planning: Goal: Ability to manage health-related needs will improve Outcome: Progressing   Problem: Clinical Measurements: Goal: Ability to maintain clinical measurements within  normal limits will improve Outcome: Progressing Goal: Will remain free from infection Outcome: Progressing Goal: Diagnostic test results will improve Outcome: Progressing Goal: Respiratory complications will improve Outcome: Progressing Goal: Cardiovascular complication will be avoided Outcome: Progressing   Problem: Activity: Goal: Risk for activity intolerance will decrease Outcome: Progressing   Problem: Nutrition: Goal: Adequate nutrition will be maintained Outcome: Progressing   Problem: Coping: Goal: Level of anxiety will decrease Outcome: Progressing   Problem: Elimination: Goal: Will not experience complications related to bowel motility Outcome: Progressing Goal: Will not experience complications related to urinary retention Outcome: Progressing   Problem: Pain Managment: Goal: General experience of comfort will improve Outcome: Progressing   Problem: Safety: Goal: Ability to remain free from injury will improve Outcome: Progressing   Problem: Skin Integrity: Goal: Risk for impaired skin integrity will decrease Outcome: Progressing   

## 2020-05-31 NOTE — Progress Notes (Addendum)
[place]. Spoke to RN recieving patient and gave report. Currently waiting on [facility] to arrive and transport patient.

## 2020-06-02 ENCOUNTER — Encounter: Payer: Self-pay | Admitting: *Deleted

## 2020-06-02 ENCOUNTER — Telehealth: Payer: Self-pay | Admitting: *Deleted

## 2020-06-02 NOTE — Telephone Encounter (Signed)
Patient will be enrolled for [employer] to ship a 30 day cardiac event monitor to : [facility] in care of  Magally Vahle [address], [place], [place]  [address] Attn: Unit Manager Marina Goodell [phone] Letter with instructions mailed to same address.

## 2020-06-13 ENCOUNTER — Ambulatory Visit (INDEPENDENT_AMBULATORY_CARE_PROVIDER_SITE_OTHER): Payer: Medicare Other

## 2020-06-13 DIAGNOSIS — I639 Cerebral infarction, unspecified: Secondary | ICD-10-CM

## 2020-07-08 ENCOUNTER — Telehealth: Payer: Self-pay | Admitting: Internal Medicine

## 2020-07-08 NOTE — Telephone Encounter (Signed)
Results reviewed with patient

## 2020-07-08 NOTE — Telephone Encounter (Signed)
Patient returning call for heart monitor.

## 2020-07-11 ENCOUNTER — Telehealth: Payer: Self-pay | Admitting: *Deleted

## 2020-07-11 NOTE — Telephone Encounter (Signed)
-----   Message from Garvin Fila, MD sent at 07/11/2020  5:27 PM EDT ----- Donna [name] inform the patient that heart monitoring study did not reveal any evidence of atrial fibrillation or any worrisome arrhythmia.

## 2020-07-11 NOTE — Progress Notes (Signed)
Kindly inform the patient that heart monitoring study did not reveal any evidence of atrial fibrillation or any worrisome arrhythmia.

## 2020-07-11 NOTE — Telephone Encounter (Signed)
Left message with the results. Provided our number to call back with any questions.

## 2020-08-01 ENCOUNTER — Ambulatory Visit: Payer: Medicare Other | Admitting: Internal Medicine

## 2020-08-10 NOTE — Progress Notes (Deleted)
Cardiology Office Note:    Date:  08/10/2020   ID:  Donna [name], DOB [birth year], MRN [id number]  PCP:  Care, Jinny Blossom [facility]   [facility] Providers Cardiologist:  None { Click to update primary MD,subspecialty MD or APP then REFRESH:1}    CC: *** Consulted for the evaluation of stroke evaluation at the behest of Care, Jinny Blossom [facility]   History of Present Illness:    Donna [name] is a 70 y.o. 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    Discharged 05/31/20 after showing punctate subacute infarcts of the posterior limb.  Patient notes that she is feeling ***.  Has had no chest pain, chest pressure, chest tightness, chest stinging ***.  Discomfort occurs with ***, worsens with ***, and improves with ***.  Patient exertion notable for *** with *** and feels no symptoms.  No shortness of breath, DOE ***.  No PND or orthopnea***.  No bendopnea***, weight gain***, leg swelling ***, or abdominal swelling***.  No syncope or near syncope ***. Notes *** no palpitations or funny heart beats.   No leg pain or claudication.  Patient reports prior cardiac testing including *** echo, *** stress test, *** heart catheterizations, *** cardioversion, *** ablations.  No history of ***pre-eclampsia, early menarche, or prematurity.  No Fen-Phen or drug use***.  Ambulatory BP ***.   Past Medical History:  Diagnosis Date   Allergy    Anxiety    Depression    Hyperlipidemia    no meds    Hypertension     Past Surgical History:  Procedure Laterality Date   DENTAL SURGERY     NSVD     x2   TUBAL LIGATION  1976    Current Medications: No outpatient medications have been marked as taking for the 08/11/20 encounter (Appointment) with Werner Lean, MD.     Allergies:   Patient has no known allergies.   Social History   Socioeconomic History   Marital status: Divorced     Spouse name: Not on file   Number of children: Not on file   Years of education: Not on file   Highest education level: Not on file  Occupational History   Not on file  Tobacco Use   Smoking status: Every Day   Smokeless tobacco: Never   Tobacco comments:    8 cigs a day   Substance and Sexual Activity   Alcohol use: No   Drug use: No   Sexual activity: Not on file  Other Topics Concern   Not on file  Social History Narrative   Not on file   Social Determinants of Health   Financial Resource Strain: Not on file  Food Insecurity: Not on file  Transportation Needs: Not on file  Physical Activity: Not on file  Stress: Not on file  Social Connections: Not on file     Family History: The patient's ***family history includes Breast cancer in her sister; Colon cancer in her mother; Colon polyps in her sister; Esophageal cancer in her sister; Lung cancer in her sister. There is no history of Rectal cancer, Stomach cancer, or Pancreatic cancer.  ROS:   Please see the history of present illness.    *** All other systems reviewed and are negative.  EKGs/Labs/Other Studies Reviewed:    The following studies were reviewed today: ***  EKG:  EKG is *** ordered today.  The ekg ordered  today demonstrates ***  Cardiac Event Monitoring: Date: 06/29/20 Results: Patient had a minimum heart rate of 54 bpm, maximum heart rate of 68 bpm, and average heart rate of 107 bpm. Predominant underlying rhythm was sinus rhythm. Isolated PACs were rare (<1.0%). Isolated PVCs were rare (<1.0%). No evidence of complete heart block or evidence of atrial fibrillation. No triggered or diary events.   No malignant arrhythmias.  Transthoracic Echocardiogram: Date: 05/26/20 Results:  1. Left ventricular ejection fraction, by estimation, is 65 to 70%. The  left ventricle has normal function. The left ventricle has no regional  wall motion abnormalities. Left ventricular diastolic parameters were   normal.   2. Right ventricular systolic function is normal. The right ventricular  size is normal. There is normal pulmonary artery systolic pressure.   3. The mitral valve is normal in structure. No evidence of mitral valve  regurgitation. No evidence of mitral stenosis.   4. The aortic valve is normal in structure. Aortic valve regurgitation is  not visualized. No aortic stenosis is present.   5. We do not visualize the AA well at 3 mm about the aortic root    Recent Labs: 05/25/2020: ALT 17 05/26/2020: BUN 17; Creatinine, Ser 0.83; Potassium 3.7; Sodium 138 05/29/2020: Hemoglobin 14.5; Platelets 234  Recent Lipid Panel    Component Value Date/Time   CHOL 168 05/26/2020 0319   TRIG 192 (H) 05/26/2020 0319   HDL 47 05/26/2020 0319   CHOLHDL 3.6 05/26/2020 0319   VLDL 38 05/26/2020 0319   LDLCALC 83 05/26/2020 0319     Risk Assessment/Calculations:   {Does this patient have ATRIAL FIBRILLATION?:[phone]}       Physical Exam:    VS:  There were no vitals taken for this visit.    Wt Readings from Last 3 Encounters:  05/31/20 161 lb 13.1 oz (73.4 kg)  12/31/16 189 lb (85.7 kg)  12/12/16 189 lb (85.7 kg)     GEN: *** Well nourished, well developed in no acute distress HEENT: Normal NECK: No JVD; No carotid bruits LYMPHATICS: No lymphadenopathy CARDIAC: ***RRR, no murmurs, rubs, gallops RESPIRATORY:  Clear to auscultation without rales, wheezing or rhonchi  ABDOMEN: Soft, non-tender, non-distended MUSCULOSKELETAL:  No edema; No deformity  SKIN: Warm and dry NEUROLOGIC:  Alert and oriented x 3 PSYCHIATRIC:  Normal affect   ASSESSMENT:    No diagnosis found. PLAN:    In order of problems listed above:  ***   {Are you ordering a CV Procedure (e.g. stress test, cath, DCCV, TEE, etc)?   Press F2        :[id number]    Medication Adjustments/Labs and Tests Ordered: Current medicines are reviewed at length with the patient today.  Concerns regarding medicines  are outlined above.  No orders of the defined types were placed in this encounter.  No orders of the defined types were placed in this encounter.   There are no Patient Instructions on file for this visit.   Signed, Werner Lean, MD  08/10/2020 7:54 AM    [place]

## 2020-08-11 ENCOUNTER — Ambulatory Visit: Payer: Medicare Other | Admitting: Internal Medicine

## 2021-04-19 ENCOUNTER — Other Ambulatory Visit: Payer: Self-pay

## 2021-04-19 ENCOUNTER — Ambulatory Visit: Payer: 59 | Attending: Nurse Practitioner | Admitting: Rehabilitation

## 2021-04-19 ENCOUNTER — Encounter: Payer: Self-pay | Admitting: Rehabilitation

## 2021-04-19 ENCOUNTER — Telehealth: Payer: Self-pay | Admitting: Rehabilitation

## 2021-04-19 DIAGNOSIS — R2681 Unsteadiness on feet: Secondary | ICD-10-CM | POA: Diagnosis present

## 2021-04-19 DIAGNOSIS — I69354 Hemiplegia and hemiparesis following cerebral infarction affecting left non-dominant side: Secondary | ICD-10-CM | POA: Insufficient documentation

## 2021-04-19 DIAGNOSIS — R2689 Other abnormalities of gait and mobility: Secondary | ICD-10-CM | POA: Diagnosis present

## 2021-04-19 NOTE — Telephone Encounter (Signed)
Entered in error

## 2021-04-19 NOTE — Addendum Note (Signed)
Addended by: Cameron Sprang A on: 04/19/2021 03:15 PM ? ? Modules accepted: Orders ? ?

## 2021-04-19 NOTE — Therapy (Signed)
?OUTPATIENT PHYSICAL THERAPY NEURO EVALUATION ? ? ?Patient Name: Donna [name] ?MRN: [id number] ?DOB:[birth year], 71 y.o., female ?Today's Date: 04/19/2021 ? ?PCP: Care, Jinny Blossom [facility] ?REFERRING PROVIDER: Delford Field, FNP  ? ? ? ?Past Medical History:  ?Diagnosis Date  ? Allergy   ? Anxiety   ? Depression   ? Hyperlipidemia   ? no meds   ? Hypertension   ? ?Past Surgical History:  ?Procedure Laterality Date  ? DENTAL SURGERY    ? NSVD    ? x2  ? [place]  ? ?Patient Active Problem List  ? Diagnosis Date Noted  ? Cough   ? Cigarette nicotine dependence without complication   ? Stroke ([place]) 05/25/2020  ? Primary hypertension   ? ? ?ONSET DATE: 05/25/20 ? ?REFERRING DIAG: V94.801 (ICD-10-CM) - Hemiplegia and hemiparesis following cerebral infarction affecting left non-dominant side  ? ?THERAPY DIAG:  ?Unsteadiness on feet ? ?Other abnormalities of gait and mobility ? ?SUBJECTIVE:  ?                                                                                                                                                                                           ? ?SUBJECTIVE STATEMENT: ?"My arm is weak still but I have come so far.  I can now lift things with my L arm and I'm learning to walk again but I'm doing good. Every day it gets a little better."  ?Pt accompanied by: self ? ?PERTINENT HISTORY: Anxiety, Depression, Hyperlipidemia, Hypertension ? ?PAIN:  ?Are you having pain? No ? ?PRECAUTIONS: Fall ? ?WEIGHT BEARING RESTRICTIONS No ? ?FALLS: Has patient fallen in last 6 months? No ? ?LIVING ENVIRONMENT: ?Lives with: lives alone ?Lives in: House/apartment ?Stairs: Yes: External: 2 steps; none ?Has following equipment at home:  quad tip cane, RW ? ?PLOF: Independent ? ?PATIENT GOALS "I want to walk without the cane, I want to be more independent, I want to use my L arm better."  ? ?OBJECTIVE:  ? ?DIAGNOSTIC FINDINGS: CT head without acute intracranial hemorrhage or evidence of  acute infarction but with age det. MRI brain and neck angio showing acute small infarcts along the roof of the right temporal horn and head of the right hippocampus. Acute or subacute punctate cortical right occipital and left temporal infarcts. Possible punctate subacute infarct of posterior limb of right internal capsule. Chronic infarct right corona radiata. 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 Intracranially, suspect retrograde flow to the PICA origin. Patient was not given tPA because outside of the window and clinical exam more consistent with small vessel disease without  large vessel occlusion.  ? ?COGNITION: ?Overall cognitive status: Within functional limits for tasks assessed ?  ?SENSATION: ?WFL ? ?COORDINATION: ?Limited in LLE due to strength deficits.  ? ? ?MUSCLE TONE: LLE: Mild ? ?POSTURE: No Significant postural limitations ? ?LE ROM:    ? ? Right ?04/19/2021 Left ?04/19/2021  ?Hip flexion    ?Hip extension    ?Hip abduction    ?Hip adduction    ?Hip internal rotation    ?Hip external rotation    ?Knee flexion    ?Knee extension    ?Ankle dorsiflexion    ?Ankle plantarflexion    ?Ankle inversion    ?Ankle eversion    ? (Blank rows = not tested) ? ?MMT:   ? ?MMT Right ?04/19/2021 Left ?04/19/2021  ?Hip flexion 5/5 3+/5  ?Hip extension    ?Hip abduction 4/5 3+/5  ?Hip adduction 4/5 3+/5  ?Hip internal rotation    ?Hip external rotation    ?Knee flexion 5/5 2-/5  ?Knee extension 5/5 3+/5  ?Ankle dorsiflexion 5/5 2+/5  ?Ankle plantarflexion 5/5 3+/5  ?Ankle inversion    ?Ankle eversion    ?(Blank rows = not tested)All MMT tested in seated position.  ? ?BED MOBILITY:  ?Sit to supine CGA ?Supine to sit SBA ? ?TRANSFERS: ?Assistive device utilized:  quad tip cane    ?Sit to stand: SBA ?Stand to sit: SBA ?Chair to chair: SBA ?Floor:  not assessed  ? ? ?STAIRS: ? Level of Assistance: CGA ? Stair Negotiation Technique: Alternating Pattern  ?Forwards with Single Rail on  Right ? Number of Stairs: 4  ? Height of Stairs: 6"  ?Comments: Note that she descends with RLE ? ?GAIT: ?Gait pattern: step through pattern, decreased step length- Right, decreased stance time- Left, decreased hip/knee flexion- Left, decreased ankle dorsiflexion- Left, lateral hip instability, and poor foot clearance- Left ?Distance walked: 100' x 2 reps  ?Assistive device utilized:  quad tip cane  and no AD  ?Level of assistance: CGA and Min A ?Comments: Assessed gait speed with and without AD.  Note that when not using AD, she tends to utilize a R steppage gait to "help" LLE work harder.  She also demos more L inversion/supination at ankle trying to clear LLE.  Feel that she may only need a light support (foot up brace vs AFO) to assist with foot clearance and help improve her quality of gait and gait speed, decreasing fall risk.  ? ?FUNCTIONAL TESTs:  ?10 meter walk test: 0.98 ft/sec without AD, 1.60 ft/sec with quad tip cane ? ? ? ?TODAY'S TREATMENT:  ?Evaluation performed, see above.  ? ? ?PATIENT EDUCATION: ?Education details: Educated on evaluation results, goals, POC, and trialing use of AFO/foot up brace.  ?Person educated: Patient ?Education method: Explanation ?Education comprehension: verbalized understanding ? ? ?HOME EXERCISE PROGRAM: ?Not initiated on eval ? ? ? ?GOALS: ?Goals reviewed with patient? Yes ? ?SHORT TERM GOALS: Target date: 05/17/2021 ? ?Pt will be IND with initial HEP in order to indicate improved functional mobility and dec fall risk. ?Baseline: ?Goal status: INITIAL ? ?2.  Pt will improve gait speed to >/=2.0 ft/sec w/ quad tip cane (and AFO if appropriate) in order to indicate dec fall risk. ? ?Baseline: 1.60 ft/sec with quad tip cane  ?Goal status: INITIAL ? ?3.  Will assess DGI and improve score by 3 points in order to indicate dec fall risk.  ?Baseline:  ?Goal status: INITIAL ? ?4.  Pt will negotiate up/down 4 steps with  single rail/cane, up/down ramp and curb and ambulate x 500'  outdoors over unlevel paved surfaces at S level with quad tip cane in order to indicate improved community mobility.  ? ?Baseline:  ?Goal status: INITIAL ? ?5.  Will assess gait with use of foot up brace/AFO to determine if this will dec fall risk and improve gait efficiency.   ?Baseline:  ?Goal status: INITIAL ? ? ? ?LONG TERM GOALS: Target date: 06/14/2021 ? ?Pt will be IND with final HEP in order to indicate improved functional mobility and dec fall risk. ?Baseline:  ?Goal status: INITIAL ? ?2.  Pt will improve gait speed to >/=2.62 ft/sec w/LRAD in order to indicate dec fall risk. ? ?Baseline:  ?Goal status: INITIAL ? ?3.  Pt will negotiate up/down 8 steps with cane, up/down ramp and curb and ambulate x 500' outdoors over unlevel paved and grassy surfaces w/ LRAD at mod I level in order to indicate improved community mobility.  ? ?Baseline:  ?Goal status: INITIAL ? ?4.  Pt will improve DGI by 6 points from baseline in order to indicate dec fall risk.  ?Baseline:  ?Goal status: INITIAL ? ?5.  Pt will ambulate x 100' w/ out AD over indoor surfaces in order to indicate improved household independence.  ?Baseline:  ?Goal status: INITIAL ? ? ? ?ASSESSMENT: ? ?CLINICAL IMPRESSION: ?Patient is a 71 y.o. female who was seen today for physical therapy evaluation and treatment for several small CVAs in May 2022 with residual L sided weakness.  Pt with history of anxiety, depression, hyperlipidemia, hypertension.  Note she did go to SNF following hospitalization and then received HHPT/OT.  Upon PT evaluation, gait speed was 0.98 ft/sec without AD and 1.60 ft/sec with AD both indicative of high fall risk, and decreased L foot clearance.  Did not have time to complete DGI but will complete it at next visit.  Pt will benefit from skilled OP neuro PT in order to address deficits.   ? ? ?OBJECTIVE IMPAIRMENTS Abnormal gait, decreased activity tolerance, decreased balance, decreased mobility, decreased ROM, decreased strength,  impaired flexibility, impaired UE functional use, and postural dysfunction.  ? ?ACTIVITY LIMITATIONS driving, occupation, and shopping.  ? ?PERSONAL FACTORS Time since onset of injury/illness/exacerbation and 3+ com

## 2021-04-25 ENCOUNTER — Ambulatory Visit: Payer: 59 | Admitting: Physical Therapy

## 2021-05-03 ENCOUNTER — Ambulatory Visit: Payer: 59 | Admitting: Rehabilitation

## 2021-05-05 ENCOUNTER — Ambulatory Visit: Payer: 59 | Admitting: Physical Therapy

## 2021-05-05 ENCOUNTER — Encounter: Payer: Self-pay | Admitting: Physical Therapy

## 2021-05-05 DIAGNOSIS — I69354 Hemiplegia and hemiparesis following cerebral infarction affecting left non-dominant side: Secondary | ICD-10-CM

## 2021-05-05 DIAGNOSIS — R2681 Unsteadiness on feet: Secondary | ICD-10-CM

## 2021-05-05 DIAGNOSIS — R2689 Other abnormalities of gait and mobility: Secondary | ICD-10-CM

## 2021-05-05 NOTE — Therapy (Signed)
?OUTPATIENT PHYSICAL THERAPY TREATMENT NOTE ? ? ?Patient Name: Donna [name] ?MRN: [id number] ?DOB:1950/11/28, 71 y.o., female ?Today's Date: 05/05/2021 ? ?PCP: Care, Jinny Blossom [facility] ?REFERRING PROVIDER: Care, Jinny Blossom [facility]* ? ?END OF SESSION:  ? PT End of Session - 05/05/21 1451   ? ? Visit Number 2   ? Number of Visits 17   ? Date for PT Re-Evaluation 06/18/21   ? Authorization Type [employer] Medicare and Medicaid (needs progress note)   ? Progress Note Due on Visit 10   ? PT Start Time 0092   ? PT Stop Time 1530   ? PT Time Calculation (min) 43 min   ? Equipment Utilized During Treatment Gait belt   ? Activity Tolerance Patient tolerated treatment well   ? Behavior During Therapy [facility] for tasks assessed/performed   ? ?  ?  ? ?  ? ? ?Past Medical History:  ?Diagnosis Date  ? Allergy   ? Anxiety   ? Depression   ? Hyperlipidemia   ? no meds   ? Hypertension   ? ?Past Surgical History:  ?Procedure Laterality Date  ? DENTAL SURGERY    ? NSVD    ? x2  ? [place]  ? ?Patient Active Problem List  ? Diagnosis Date Noted  ? Cough   ? Cigarette nicotine dependence without complication   ? Stroke ([place]) 05/25/2020  ? Primary hypertension   ? ? ?REFERRING DIAG: I69.354 (ICD-10-CM) - Hemiplegia and hemiparesis following cerebral infarction affecting left non-dominant side  ? ?THERAPY DIAG:  ?Unsteadiness on feet ? ?Other abnormalities of gait and mobility ? ?Hemiplegia and hemiparesis following cerebral infarction affecting left non-dominant side ([place]) ? ?PERTINENT HISTORY: Anxiety, Depression, Hyperlipidemia, Hypertension ? ?PRECAUTIONS: Fall ? ?SUBJECTIVE: No falls since eval.  No changes.  Pt reports she has been using seated stepper and walking without the cane inside her home and using cane and RW when outside the house. ? ?PAIN:  ?Are you having pain? No ? ? ?OBJECTIVE: (objective measures completed at initial evaluation unless otherwise dated) ? ?TODAY'S TREATMENT:  ?Assessed DGI: ? [facility] PT  Assessment - 05/05/21 1456   ? ?  ? Balance  ? Balance Assessed Yes   ?  ? Standardized Balance Assessment  ? Standardized Balance Assessment Dynamic Gait Index   ?  ? Dynamic Gait Index  ? Level Surface Mild Impairment   ? Change in Gait Speed Moderate Impairment   ? Gait with Horizontal Head Turns Moderate Impairment   ? Gait with Vertical Head Turns Mild Impairment   ? Gait and Pivot Turn Mild Impairment   ? Step Over Obstacle Severe Impairment   ? Step Around Obstacles Normal   ? Steps Moderate Impairment   ? Total Score 12   ? DGI comment: high fall risk   ? ?  ?  ? ?  ? ?Initiated HEP.  See below for details. ? Pt ambulates various short bouts in gym requiring increased time to complete task during assessment and to and from countertop and stairs requiring CGA-minA due to R LOB w/o use of tripod cane especially when turning as pt tends to demonstrate crossover gait during turns.  She has decreased stance on the RLE and decreased step length bilaterally.  Cued for inc knee flexion throughout.   ?  ?PATIENT EDUCATION: ?Education details: Edu on DGI results and fall risk.  Initial HEP.  ?Person educated: Patient ?Education method: Explanation ?Education comprehension: verbalized understanding ?  ?  ?  HOME EXERCISE PROGRAM: ?Access Code: [email or web address] ?URL: [email or web address] ?Date: 05/05/2021 ?Prepared by: Elease Etienne ? ?Exercises ?- Seated Gastroc Stretch with Strap  - 1 x daily - 5 x weekly - 2 sets - 2 reps - 30 seconds hold ?- Heel Raises with Counter Support  - 1 x daily - 4 x weekly - 12 reps ?- Seated Hamstring Curls with Resistance  - 1 x daily - 5 x weekly - 2 sets - 10 reps ?- Standing Single Leg Stance with Counter Support  - 1 x daily - 5 x weekly - 1 sets - 2 reps - 30 seconds hold ?- Standing March with Counter Support  - 1 x daily - 5 x weekly - 2 sets - 20 reps ?  ?  ?  ?GOALS: ?Goals reviewed with patient? Yes ?  ?SHORT TERM GOALS: Target date: 05/17/2021 ?  ?Pt will be IND with  initial HEP in order to indicate improved functional mobility and dec fall risk. ?Baseline: ?Goal status: INITIAL ?  ?2.  Pt will improve gait speed to >/=2.0 ft/sec w/ quad tip cane (and AFO if appropriate) in order to indicate dec fall risk. ?  ?Baseline: 1.60 ft/sec with quad tip cane  ?Goal status: INITIAL ?  ?3.  Will assess DGI and improve score by 3 points in order to indicate dec fall risk.  ?Baseline: 12/24 ?Goal status: INITIAL ?  ?4.  Pt will negotiate up/down 4 steps with single rail/cane, up/down ramp and curb and ambulate x 500' outdoors over unlevel paved surfaces at S level with quad tip cane in order to indicate improved community mobility.  ?  ?Baseline:  ?Goal status: INITIAL ?  ?5.  Will assess gait with use of foot up brace/AFO to determine if this will dec fall risk and improve gait efficiency.   ?Baseline:  ?Goal status: INITIAL ?  ?  ?  ?LONG TERM GOALS: Target date: 06/14/2021 ?  ?Pt will be IND with final HEP in order to indicate improved functional mobility and dec fall risk. ?Baseline:  ?Goal status: INITIAL ?  ?2.  Pt will improve gait speed to >/=2.62 ft/sec w/LRAD in order to indicate dec fall risk. ?  ?Baseline:  ?Goal status: INITIAL ?  ?3.  Pt will negotiate up/down 8 steps with cane, up/down ramp and curb and ambulate x 500' outdoors over unlevel paved and grassy surfaces w/ LRAD at mod I level in order to indicate improved community mobility.  ?  ?Baseline:  ?Goal status: INITIAL ?  ?4.  Pt will improve DGI by 6 points from baseline in order to indicate dec fall risk.  ?Baseline: 12/24 ?Goal status: INITIAL ?  ?5.  Pt will ambulate x 100' w/ out AD over indoor surfaces in order to indicate improved household independence.  ?Baseline:  ?Goal status: INITIAL ?  ?  ?  ?ASSESSMENT: ?  ?CLINICAL IMPRESSION: ?Pt scores 12/24 on DGI today indicating a high fall risk.  She demonstrates dragging of the LLE during small bouts of ambulation throughout gym with increased ER at the hip.   Initiated HEP for hamstring and hip strengthening and static balance.  Continue per POC. ?  ?  ?OBJECTIVE IMPAIRMENTS Abnormal gait, decreased activity tolerance, decreased balance, decreased mobility, decreased ROM, decreased strength, impaired flexibility, impaired UE functional use, and postural dysfunction.  ?  ?ACTIVITY LIMITATIONS driving, occupation, and shopping.  ?  ?PERSONAL FACTORS Time since onset of injury/illness/exacerbation and 3+ comorbidities: see above  are also affecting patient's  functional outcome.  ?  ?  ?REHAB POTENTIAL: Good ?  ?CLINICAL DECISION MAKING: Evolving/moderate complexity ?  ?EVALUATION COMPLEXITY: Moderate ?  ?PLAN: ?PT FREQUENCY: 2x/week ?  ?PT DURATION: 8 weeks ?  ?PLANNED INTERVENTIONS: Therapeutic exercises, Therapeutic activity, Neuromuscular re-education, Balance training, Gait training, Patient/Family education, Vestibular training, Orthotic/Fit training, DME instructions, Aquatic Therapy, and Manual therapy ?  ?PLAN FOR NEXT SESSION: Is she doing L calf stretch?  trial foot up brace vs AFO for LLE.  Continue strengthening for RLE, balance, work on step ups with RLE, incorporate hurdles as pt was unable to do on DGI. ? ? ? ?Bary Richard, PT, DPT ?05/05/2021, 4:51 PM ? ?  ? ?

## 2021-05-05 NOTE — Patient Instructions (Signed)
Access Code: [email or web address] ?URL: [email or web address] ?Date: 05/05/2021 ?Prepared by: Elease Etienne ? ?Exercises ?- Seated Gastroc Stretch with Strap  - 1 x daily - 5 x weekly - 2 sets - 2 reps - 30 seconds hold ?- Heel Raises with Counter Support  - 1 x daily - 4 x weekly - 12 reps ?- Seated Hamstring Curls with Resistance  - 1 x daily - 5 x weekly - 2 sets - 10 reps ?- Standing Single Leg Stance with Counter Support  - 1 x daily - 5 x weekly - 1 sets - 2 reps - 30 seconds hold ?- Standing March with Counter Support  - 1 x daily - 5 x weekly - 2 sets - 20 reps ?

## 2021-05-10 ENCOUNTER — Encounter: Payer: Self-pay | Admitting: Rehabilitation

## 2021-05-10 ENCOUNTER — Ambulatory Visit: Payer: 59 | Admitting: Rehabilitation

## 2021-05-10 DIAGNOSIS — R2681 Unsteadiness on feet: Secondary | ICD-10-CM

## 2021-05-10 DIAGNOSIS — I69354 Hemiplegia and hemiparesis following cerebral infarction affecting left non-dominant side: Secondary | ICD-10-CM

## 2021-05-10 DIAGNOSIS — R2689 Other abnormalities of gait and mobility: Secondary | ICD-10-CM

## 2021-05-10 NOTE — Therapy (Signed)
?OUTPATIENT PHYSICAL THERAPY TREATMENT NOTE ? ? ?Patient Name: Donna [name] ?MRN: [id number] ?DOB:April 29, 1950, 71 y.o., female ?Today's Date: 05/10/2021 ? ?PCP: Care, Jinny Blossom [facility] ?REFERRING PROVIDER: Care, Jinny Blossom Tota* ? ?END OF SESSION:  ? PT End of Session - 05/10/21 1418   ? ? Visit Number 3   ? Number of Visits 17   ? Date for PT Re-Evaluation 06/18/21   ? Authorization Type UHC Medicare and Medicaid (needs progress note)   ? Progress Note Due on Visit 10   ? PT Start Time 1415   ? PT Stop Time 8366   ? PT Time Calculation (min) 30 min   ? Equipment Utilized During Treatment Gait belt   ? Activity Tolerance Patient tolerated treatment well   ? Behavior During Therapy [facility] for tasks assessed/performed   ? ?  ?  ? ?  ? ? ?Past Medical History:  ?Diagnosis Date  ? Allergy   ? Anxiety   ? Depression   ? Hyperlipidemia   ? no meds   ? Hypertension   ? ?Past Surgical History:  ?Procedure Laterality Date  ? DENTAL SURGERY    ? NSVD    ? x2  ? [place]  ? ?Patient Active Problem List  ? Diagnosis Date Noted  ? Cough   ? Cigarette nicotine dependence without complication   ? Stroke ([place]) 05/25/2020  ? Primary hypertension   ? ? ?REFERRING DIAG: I69.354 (ICD-10-CM) - Hemiplegia and hemiparesis following cerebral infarction affecting left non-dominant side  ? ?THERAPY DIAG:  ?Unsteadiness on feet ? ?Other abnormalities of gait and mobility ? ?Hemiplegia and hemiparesis following cerebral infarction affecting left non-dominant side ([place]) ? ?PERTINENT HISTORY: Anxiety, Depression, Hyperlipidemia, Hypertension ? ?PRECAUTIONS: Fall ? ?SUBJECTIVE: Pt doing well, continues to report progress, no falls.  ? ?PAIN:  ?Are you having pain? No ? ? ?OBJECTIVE: (objective measures completed at initial evaluation unless otherwise dated) ? ?TODAY'S TREATMENT:  ? 05/10/21 ?Pt arrived late to session so limited to 30 mins. Focused on assessment of varying support/AFO options to allow clearance for LLE during  gait.  PT trialed use of foot up brace first since least amount of support.  Ambulated x 115' without cane at S to min/guard level with only minimal improvement in L foot clearance, therefore donned ottobock PLS brace (note that PT was unable to get under her insert so kept above insert during session).  Ambulated another 115', again only noting slight improvement in gait quality.  Also note she tends to keep LUE guarded with cues for lowering by side and swinging during gait.  Ended session with trial of Ottobock reaction AFO with moderate improvement in gait (again without AD per pt request).  She does still seem to have a slight PF tone pattern and may still need toe cap for full clearance.  Will continue to work in future sessions and request order as appropriate.  Continue to recommend that pt use cane at all times for safety.   ? ?Ended session with NMR for improved L hip protraction/hip extension in stance with tapping RLE to first, then second step before returning to floor with single UE support>no UE support with cues for posture.  She seems to have tightness in L hip preventing her for achieving full extension therefore had her lie on mat and with LLE off EOM and PT providing light knee flex.  This was a great stretch for her so added to HEP and educated to have  [place]gan aide assist with knee flexion as able.   ?PATIENT EDUCATION: ?Education details: ?Person educated: Patient ?Education method: Explanation ?Education comprehension: verbalized understanding ?  ?  ?HOME EXERCISE PROGRAM: ?Access Code: [email or web address] ?URL: [email or web address] ?Date: 05/05/2021 ?Prepared by: Elease Etienne ? ?Exercises ?- Seated Gastroc Stretch with Strap  - 1 x daily - 5 x weekly - 2 sets - 2 reps - 30 seconds hold ?- Heel Raises with Counter Support  - 1 x daily - 4 x weekly - 12 reps ?- Seated Hamstring Curls with Resistance  - 1 x daily - 5 x weekly - 2 sets - 10 reps ?- Standing Single Leg Stance with Counter  Support  - 1 x daily - 5 x weekly - 1 sets - 2 reps - 30 seconds hold ?- Standing March with Counter Support  - 1 x daily - 5 x weekly - 2 sets - 20 reps ?  ?  ?  ?GOALS: ?Goals reviewed with patient? Yes ?  ?SHORT TERM GOALS: Target date: 05/17/2021 ?  ?Pt will be IND with initial HEP in order to indicate improved functional mobility and dec fall risk. ?Baseline: ?Goal status: INITIAL ?  ?2.  Pt will improve gait speed to >/=2.0 ft/sec w/ quad tip cane (and AFO if appropriate) in order to indicate dec fall risk. ?  ?Baseline: 1.60 ft/sec with quad tip cane  ?Goal status: INITIAL ?  ?3.  Will assess DGI and improve score by 3 points in order to indicate dec fall risk.  ?Baseline: 12/24 ?Goal status: INITIAL ?  ?4.  Pt will negotiate up/down 4 steps with single rail/cane, up/down ramp and curb and ambulate x 500' outdoors over unlevel paved surfaces at S level with quad tip cane in order to indicate improved community mobility.  ?  ?Baseline:  ?Goal status: INITIAL ?  ?5.  Will assess gait with use of foot up brace/AFO to determine if this will dec fall risk and improve gait efficiency.   ?Baseline:  ?Goal status: INITIAL ?  ?  ?  ?LONG TERM GOALS: Target date: 06/14/2021 ?  ?Pt will be IND with final HEP in order to indicate improved functional mobility and dec fall risk. ?Baseline:  ?Goal status: INITIAL ?  ?2.  Pt will improve gait speed to >/=2.62 ft/sec w/LRAD in order to indicate dec fall risk. ?  ?Baseline:  ?Goal status: INITIAL ?  ?3.  Pt will negotiate up/down 8 steps with cane, up/down ramp and curb and ambulate x 500' outdoors over unlevel paved and grassy surfaces w/ LRAD at mod I level in order to indicate improved community mobility.  ?  ?Baseline:  ?Goal status: INITIAL ?  ?4.  Pt will improve DGI by 6 points from baseline in order to indicate dec fall risk.  ?Baseline: 12/24 ?Goal status: INITIAL ?  ?5.  Pt will ambulate x 100' w/ out AD over indoor surfaces in order to indicate improved household  independence.  ?Baseline:  ?Goal status: INITIAL ?  ?  ?  ?ASSESSMENT: ?  ?CLINICAL IMPRESSION: ?Skilled session focused on assessment of gait with AFOs.  Ottobock reaction AFO provided best support during session and would like to continue to work with this at least one more session prior to getting order.  Also worked to improve L LE hip extension and hip flex flexibility.  Pt tolerated well and is progressing towards goals.  ?  ?  ?OBJECTIVE IMPAIRMENTS Abnormal gait, decreased activity tolerance, decreased balance, decreased mobility, decreased  ROM, decreased strength, impaired flexibility, impaired UE functional use, and postural dysfunction.  ?  ?ACTIVITY LIMITATIONS driving, occupation, and shopping.  ?  ?PERSONAL FACTORS Time since onset of injury/illness/exacerbation and 3+ comorbidities: see above  are also affecting patient's functional outcome.  ?  ?  ?REHAB POTENTIAL: Good ?  ?CLINICAL DECISION MAKING: Evolving/moderate complexity ?  ?EVALUATION COMPLEXITY: Moderate ?  ?PLAN: ?PT FREQUENCY: 2x/week ?  ?PT DURATION: 8 weeks ?  ?PLANNED INTERVENTIONS: Therapeutic exercises, Therapeutic activity, Neuromuscular re-education, Balance training, Gait training, Patient/Family education, Vestibular training, Orthotic/Fit training, DME instructions, Aquatic Therapy, and Manual therapy ?  ?PLAN FOR NEXT SESSION: goals due 5/3, continue to work with L ottobock reaction AFO, LLE NMR, hip flex stretch/quad stretch.  Gait with cane and without, balance  ? ? ?Cameron Sprang, PT, MPT ?[place] ?[place], [place], [address] ?Phone: [phone]   Fax:  [phone] ?05/10/21, 8:41 PM ? ? ?  ? ?

## 2021-05-12 ENCOUNTER — Ambulatory Visit: Payer: 59 | Admitting: Physical Therapy

## 2021-05-12 DIAGNOSIS — R2681 Unsteadiness on feet: Secondary | ICD-10-CM | POA: Diagnosis not present

## 2021-05-12 DIAGNOSIS — R2689 Other abnormalities of gait and mobility: Secondary | ICD-10-CM

## 2021-05-12 DIAGNOSIS — I69354 Hemiplegia and hemiparesis following cerebral infarction affecting left non-dominant side: Secondary | ICD-10-CM

## 2021-05-12 NOTE — Therapy (Signed)
?OUTPATIENT PHYSICAL THERAPY TREATMENT NOTE ? ? ?Patient Name: Donna [name] ?MRN: [id number] ?DOB:1950/10/04, 71 y.o., female ?Today's Date: 05/12/2021 ? ?PCP: Care, Jinny Blossom [facility] ?REFERRING PROVIDER: Care, Jinny Blossom Tota* ? ?END OF SESSION:  ? PT End of Session - 05/12/21 1319   ? ? Visit Number 4   ? Number of Visits 17   ? Date for PT Re-Evaluation 06/18/21   ? Authorization Type UHC Medicare and Medicaid (needs progress note)   ? Progress Note Due on Visit 10   ? PT Start Time 6256   ? PT Stop Time 3893   ? PT Time Calculation (min) 40 min   ? Equipment Utilized During Treatment Gait belt   ? Activity Tolerance Patient tolerated treatment well   ? Behavior During Therapy [facility] for tasks assessed/performed   ? ?  ?  ? ?  ? ? ? ?Past Medical History:  ?Diagnosis Date  ? Allergy   ? Anxiety   ? Depression   ? Hyperlipidemia   ? no meds   ? Hypertension   ? ?Past Surgical History:  ?Procedure Laterality Date  ? DENTAL SURGERY    ? NSVD    ? x2  ? [place]  ? ?Patient Active Problem List  ? Diagnosis Date Noted  ? Cough   ? Cigarette nicotine dependence without complication   ? Stroke ([place]) 05/25/2020  ? Primary hypertension   ? ? ?REFERRING DIAG: I69.354 (ICD-10-CM) - Hemiplegia and hemiparesis following cerebral infarction affecting left non-dominant side  ? ?THERAPY DIAG:  ?Hemiplegia and hemiparesis following cerebral infarction affecting left non-dominant side ([place]) ? ?Unsteadiness on feet ? ?Other abnormalities of gait and mobility ? ?PERTINENT HISTORY: Anxiety, Depression, Hyperlipidemia, Hypertension ? ?PRECAUTIONS: Fall ? ?SUBJECTIVE: Pt doing well, reports exercises are going well but has not been performing hip stretch due to not having assistance at home. No new falls.   ? ?PAIN:  ?Are you having pain? No ? ? ?OBJECTIVE: (objective measures completed at initial evaluation unless otherwise dated) ? ?TODAY'S TREATMENT:  ?Ther Act ? [facility] PT Assessment - 05/12/21 1329   ? ?  ?  Ambulation/Gait  ? Gait velocity 32.8' over 16.31s = 2.01 ft/s   Used SPC w/quad tip, no AFO  ? ?  ?  ? ?  ? ? ?Gait Training  ?RAMP:  ?Level of Assistance: SBA ?Assistive device utilized:  SPC w/quad tip ?Ramp Comments: One minor anterior LOB when coming down ramp 2/2 fatigue that pt was able to correct on her own,  ? ?CURB:  ?Level of Assistance: SBA ?Assistive device utilized:  SPC w/quad tip ?Curb Comments: Noted good sequence of BLEs when ascending/descending ramp, min cues for proper cane placement.  ? ?STAIRS: ? Level of Assistance: SBA ? Stair Negotiation Technique: Step to Pattern with Single Rail on Right ? Number of Stairs: 12  ? Height of Stairs: 6"  ?Comments: Reviewed proper sequencing when ascending/descending steps (Up w/RLE, down w/LLE)  ? ?GAIT: ?Gait pattern: step through pattern, decreased arm swing- Left, decreased hip/knee flexion- Left, decreased ankle dorsiflexion- Left, Left steppage, Left foot flat, and poor foot clearance- Left ?Distance walked: >500' outside on sidewalk around building  ?Assistive device utilized:  cane w/quad tip ?Level of assistance: SBA ?Comments: Min cues provided for improved hip/knee flexion of LLE for improved step clearance. Noted decreased step clearance as pt fatigued. Good use of cane throughout. NO LOB noted.  ? ? ?PATIENT EDUCATION: ?Education details:wearing lace-up shoes to  therapy, STG assessment results, continuing HEP  ?Person educated: Patient ?Education method: Explanation ?Education comprehension: verbalized understanding ?  ?  ?HOME EXERCISE PROGRAM: ?Access Code: [email or web address] ?URL: [email or web address] ?Date: 05/12/2021 ?Prepared by: Mickie Bail Michal Strzelecki ? ?Exercises ?- Seated Gastroc Stretch with Strap  - 1 x daily - 5 x weekly - 2 sets - 2 reps - 30 seconds hold ?- Heel Raises with Counter Support  - 1 x daily - 4 x weekly - 12 reps ?- Seated Hamstring Curls with Resistance  - 1 x daily - 5 x weekly - 2 sets - 10 reps ?- Standing Single Leg  Stance with Counter Support  - 1 x daily - 5 x weekly - 1 sets - 2 reps - 30 seconds hold ?- Standing March with Counter Support  - 1 x daily - 5 x weekly - 2 sets - 20 reps ?- Supine Quadriceps Stretch with Strap on Table  - 1 x daily - 7 x weekly - 3 sets - 10 reps ?- Modified Thomas Stretch  - 1 x daily - 7 x weekly - 1 sets - 3 reps - 30 secs hold ?  ?  ?  ?GOALS: ?Goals reviewed with patient? Yes ?  ?SHORT TERM GOALS: Target date: 05/17/2021 ?  ?Pt will be IND with initial HEP in order to indicate improved functional mobility and dec fall risk. ?Baseline: ?Goal status: MET  ?  ?2.  Pt will improve gait speed to >/=2.0 ft/sec w/ quad tip cane (and AFO if appropriate) in order to indicate dec fall risk. ?  ?Baseline: 1.60 ft/sec with quad tip cane; 2.01 ft/s w/quad tip cane and no AFO ?Goal status: MET ?  ?3.  Will assess DGI and improve score by 3 points in order to indicate dec fall risk.  ?Baseline: 12/24 ?Goal status: INITIAL ?  ?4.  Pt will negotiate up/down 4 steps with single rail/cane, up/down ramp and curb and ambulate x 500' outdoors over unlevel paved surfaces at S level with quad tip cane in order to indicate improved community mobility.  ?  ?Baseline: See above (05/12/21) ?Goal status: MET ?  ?5.  Will assess gait with use of foot up brace/AFO to determine if this will dec fall risk and improve gait efficiency.   ?Baseline:  ?Goal status: INITIAL ?  ?  ?  ?LONG TERM GOALS: Target date: 06/14/2021 ?  ?Pt will be IND with final HEP in order to indicate improved functional mobility and dec fall risk. ?Baseline:  ?Goal status: INITIAL ?  ?2.  Pt will improve gait speed to >/=2.62 ft/sec w/LRAD in order to indicate dec fall risk. ?  ?Baseline:  ?Goal status: INITIAL ?  ?3.  Pt will negotiate up/down 8 steps with cane, up/down ramp and curb and ambulate x 500' outdoors over unlevel paved and grassy surfaces w/ LRAD at mod I level in order to indicate improved community mobility.  ?  ?Baseline:  ?Goal status:  INITIAL ?  ?4.  Pt will improve DGI by 6 points from baseline in order to indicate dec fall risk.  ?Baseline: 12/24 ?Goal status: INITIAL ?  ?5.  Pt will ambulate x 100' w/ out AD over indoor surfaces in order to indicate improved household independence.  ?Baseline:  ?Goal status: INITIAL ?  ?  ?  ?ASSESSMENT: ?  ?CLINICAL IMPRESSION: ?Emphasis of skilled PT session on beginning STG assessment. Pt has met 3 of 5 STGs so far, improving her gait velocity and demonstrating good navigation  of stairs, curb and unlevel surfaces w/quad tip cane and S*. Noted decreased step clearance of LLE as pt fatigued, min cues for over-exaggerated hip/knee flexion of LLE for improved step clearance throughout session. Pt arrived to session wearing flats, so unable to wear AFO. Encouraged pt to wear tennis shoes next session to wear AFO. Continue POC.  ?  ?  ?OBJECTIVE IMPAIRMENTS Abnormal gait, decreased activity tolerance, decreased balance, decreased mobility, decreased ROM, decreased strength, impaired flexibility, impaired UE functional use, and postural dysfunction.  ?  ?ACTIVITY LIMITATIONS driving, occupation, and shopping.  ?  ?PERSONAL FACTORS Time since onset of injury/illness/exacerbation and 3+ comorbidities: see above  are also affecting patient's functional outcome.  ?  ?  ?REHAB POTENTIAL: Good ?  ?CLINICAL DECISION MAKING: Evolving/moderate complexity ?  ?EVALUATION COMPLEXITY: Moderate ?  ?PLAN: ?PT FREQUENCY: 2x/week ?  ?PT DURATION: 8 weeks ?  ?PLANNED INTERVENTIONS: Therapeutic exercises, Therapeutic activity, Neuromuscular re-education, Balance training, Gait training, Patient/Family education, Vestibular training, Orthotic/Fit training, DME instructions, Aquatic Therapy, and Manual therapy ?  ?PLAN FOR NEXT SESSION: finish STG assessment (DGI) goals due 5/3, continue to work with L ottobock reaction AFO, LLE NMR, hip flex stretch/quad stretch.  Gait with cane and without, balance  ? ? ?Cruzita Lederer Efosa Treichler, PT,  DPT ?05/12/21, 2:47 PM ? ? ?  ? ?

## 2021-05-17 ENCOUNTER — Ambulatory Visit: Payer: 59 | Attending: Nurse Practitioner | Admitting: Rehabilitation

## 2021-05-17 ENCOUNTER — Encounter: Payer: Self-pay | Admitting: Rehabilitation

## 2021-05-17 DIAGNOSIS — R2681 Unsteadiness on feet: Secondary | ICD-10-CM | POA: Diagnosis present

## 2021-05-17 DIAGNOSIS — R2689 Other abnormalities of gait and mobility: Secondary | ICD-10-CM | POA: Diagnosis present

## 2021-05-17 DIAGNOSIS — I69354 Hemiplegia and hemiparesis following cerebral infarction affecting left non-dominant side: Secondary | ICD-10-CM | POA: Diagnosis present

## 2021-05-17 NOTE — Therapy (Signed)
?OUTPATIENT PHYSICAL THERAPY TREATMENT NOTE ? ? ?Patient Name: Donna [name] ?MRN: [id number] ?DOB:19-Dec-1950, 71 y.o., female ?Today's Date: 05/17/2021 ? ?PCP: Care, Jinny Blossom [facility] ?REFERRING PROVIDER: Care, Jinny Blossom Tota* ? ?END OF SESSION:  ? PT End of Session - 05/17/21 1400   ? ? Visit Number 5   ? Number of Visits 17   ? Date for PT Re-Evaluation 06/18/21   ? Authorization Type UHC Medicare and Medicaid (needs progress note)   ? Progress Note Due on Visit 10   ? PT Start Time 1400   ? PT Stop Time 6314   ? PT Time Calculation (min) 45 min   ? Equipment Utilized During Treatment Gait belt   ? Activity Tolerance Patient tolerated treatment well   ? Behavior During Therapy [facility] for tasks assessed/performed   ? ?  ?  ? ?  ? ? ? ?Past Medical History:  ?Diagnosis Date  ? Allergy   ? Anxiety   ? Depression   ? Hyperlipidemia   ? no meds   ? Hypertension   ? ?Past Surgical History:  ?Procedure Laterality Date  ? DENTAL SURGERY    ? NSVD    ? x2  ? [place]  ? ?Patient Active Problem List  ? Diagnosis Date Noted  ? Cough   ? Cigarette nicotine dependence without complication   ? Stroke ([place]) 05/25/2020  ? Primary hypertension   ? ? ?REFERRING DIAG: I69.354 (ICD-10-CM) - Hemiplegia and hemiparesis following cerebral infarction affecting left non-dominant side  ? ?THERAPY DIAG:  ?Hemiplegia and hemiparesis following cerebral infarction affecting left non-dominant side ([place]) ? ?Unsteadiness on feet ? ?Other abnormalities of gait and mobility ? ?PERTINENT HISTORY: Anxiety, Depression, Hyperlipidemia, Hypertension ? ?PRECAUTIONS: Fall ? ?SUBJECTIVE: Pt doing well, reports exercises are going well but has not been performing hip stretch due to not having assistance at home. No new falls.   ? ?PAIN:  ?Are you having pain? No ? ? ?OBJECTIVE: (objective measures completed at initial evaluation unless otherwise dated) ? ?TODAY'S TREATMENT:  ?Ther Ex:  ?Performed seated scifit stepper x 5 mins at level  3 resistance with BUEs/LEs for overall strengthening and endurance, but also to assist with flexion pattern to reduce tone.  Pt extremely winded during task, needing several small rest breaks.  Note that she does continue to smoke but is working to quit.   ? ? [place] Adult PT Treatment/Exercise - 05/17/21 1405   ? ?  ? Standardized Balance Assessment  ? Standardized Balance Assessment Dynamic Gait Index   ?  ? Dynamic Gait Index  ? Level Surface Mild Impairment   ? Change in Gait Speed Mild Impairment   ? Gait with Horizontal Head Turns Mild Impairment   ? Gait with Vertical Head Turns Moderate Impairment   ? Gait and Pivot Turn Mild Impairment   ? Step Over Obstacle Moderate Impairment   ? Step Around Obstacles Moderate Impairment   ? Steps Mild Impairment   ? Total Score 13   ? ?  ?  ? ?  ? ? ? ? ? ?Gait Training  ? ?GAIT: ?Gait pattern: step through pattern, decreased arm swing- Left, decreased hip/knee flexion- Left, decreased ankle dorsiflexion- Left, Left steppage, Left foot flat, and poor foot clearance- Left ?Distance walked: 400' indoors during DGI and between tasks.  ?Assistive device utilized:  cane w/quad tip  (with and without AFO) ?Level of assistance: SBA ?Comments: Min cues provided for improved hip/knee flexion of  LLE for improved step clearance. Noted decreased step clearance as pt fatigued. Good use of cane throughout. NO LOB noted.  We continued to trial L ottobock reaction AFO in session and this helps somewhat but continue to notice slight PF tone in LLE during swing and toes curling in shoes.  Trialed [place] PLS as well and again it helped about the same, but with increased gait speed, still demos dec foot clearance and had near fall needing min A to correct.   ? ?Self Care:  ? Spent a good amount of time discussing progression of stroke recovery with regards to increasing amount of tone, what tone is and how it can be managed.  PT educated on importance of stretching (has been given) to help  reduce tone.  Asked her about Baclofen and she reports she does have this but doesn't take.  It is not in her medication list but she said she would bring to next session.  Discussed that she could try to take as prescribed to see if this helps reduce tightness in LLE and that this could help our decision with AFO.  Her cognition does seem to be impaired as we went over this several times in our session and I'm not sure she has a great understanding.   ? ? ?PATIENT EDUCATION: ?Education details:STG assessment results, continuing HEP  ?Person educated: Patient ?Education method: Explanation ?Education comprehension: verbalized understanding ?  ?  ?HOME EXERCISE PROGRAM: ?Access Code: [email or web address] ?URL: [email or web address] ?Date: 05/12/2021 ?Prepared by: Mickie Bail Plaster ? ?Exercises ?- Seated Gastroc Stretch with Strap  - 1 x daily - 5 x weekly - 2 sets - 2 reps - 30 seconds hold ?- Heel Raises with Counter Support  - 1 x daily - 4 x weekly - 12 reps ?- Seated Hamstring Curls with Resistance  - 1 x daily - 5 x weekly - 2 sets - 10 reps ?- Standing Single Leg Stance with Counter Support  - 1 x daily - 5 x weekly - 1 sets - 2 reps - 30 seconds hold ?- Standing March with Counter Support  - 1 x daily - 5 x weekly - 2 sets - 20 reps ?- Supine Quadriceps Stretch with Strap on Table  - 1 x daily - 7 x weekly - 3 sets - 10 reps ?- Modified Thomas Stretch  - 1 x daily - 7 x weekly - 1 sets - 3 reps - 30 secs hold ?  ?  ?  ?GOALS: ?Goals reviewed with patient? Yes ?  ?SHORT TERM GOALS: Target date: 05/17/2021 ?  ?Pt will be IND with initial HEP in order to indicate improved functional mobility and dec fall risk. ?Baseline: ?Goal status: MET  ?  ?2.  Pt will improve gait speed to >/=2.0 ft/sec w/ quad tip cane (and AFO if appropriate) in order to indicate dec fall risk. ?  ?Baseline: 1.60 ft/sec with quad tip cane; 2.01 ft/s w/quad tip cane and no AFO ?Goal status: MET ?  ?3.  Will assess DGI and improve score by 3  points in order to indicate dec fall risk.  ?Baseline: 12/24 ?Goal status: INITIAL ?  ?4.  Pt will negotiate up/down 4 steps with single rail/cane, up/down ramp and curb and ambulate x 500' outdoors over unlevel paved surfaces at S level with quad tip cane in order to indicate improved community mobility.  ?  ?Baseline: See above (05/12/21) ?Goal status: MET ?  ?5.  Will assess gait with use of foot  up brace/AFO to determine if this will dec fall risk and improve gait efficiency.   ?Baseline:  ?Goal status: INITIAL ?  ?  ?  ?LONG TERM GOALS: Target date: 06/14/2021 ?  ?Pt will be IND with final HEP in order to indicate improved functional mobility and dec fall risk. ?Baseline:  ?Goal status: INITIAL ?  ?2.  Pt will improve gait speed to >/=2.62 ft/sec w/LRAD in order to indicate dec fall risk. ?  ?Baseline:  ?Goal status: INITIAL ?  ?3.  Pt will negotiate up/down 8 steps with cane, up/down ramp and curb and ambulate x 500' outdoors over unlevel paved and grassy surfaces w/ LRAD at mod I level in order to indicate improved community mobility.  ?  ?Baseline:  ?Goal status: INITIAL ?  ?4.  Pt will improve DGI by 6 points from baseline in order to indicate dec fall risk.  ?Baseline: 12/24 ?Goal status: INITIAL ?  ?5.  Pt will ambulate x 100' w/ out AD over indoor surfaces in order to indicate improved household independence.  ?Baseline:  ?Goal status: INITIAL ?  ?  ?  ?ASSESSMENT: ?  ?CLINICAL IMPRESSION: ?Assessed DGI with and without AFO today (and without cane) and note that there is no difference with use of reaction AFO (scored 13/24) and is still high risk for falls.  Note increased tone in LLE (PF and toes curling when using AFO) so provided a lot of education on this.  Will continue to assess and get order as needed for AFO.  ?  ?  ?OBJECTIVE IMPAIRMENTS Abnormal gait, decreased activity tolerance, decreased balance, decreased mobility, decreased ROM, decreased strength, impaired flexibility, impaired UE  functional use, and postural dysfunction.  ?  ?ACTIVITY LIMITATIONS driving, occupation, and shopping.  ?  ?PERSONAL FACTORS Time since onset of injury/illness/exacerbation and 3+ comorbidities: see above  are als

## 2021-05-19 ENCOUNTER — Ambulatory Visit: Payer: 59 | Admitting: Physical Therapy

## 2021-05-24 ENCOUNTER — Ambulatory Visit: Payer: 59 | Admitting: Rehabilitation

## 2021-05-26 ENCOUNTER — Encounter: Payer: Self-pay | Admitting: Physical Therapy

## 2021-05-26 ENCOUNTER — Ambulatory Visit: Payer: 59 | Admitting: Physical Therapy

## 2021-05-26 DIAGNOSIS — I69354 Hemiplegia and hemiparesis following cerebral infarction affecting left non-dominant side: Secondary | ICD-10-CM | POA: Diagnosis not present

## 2021-05-26 DIAGNOSIS — R2689 Other abnormalities of gait and mobility: Secondary | ICD-10-CM

## 2021-05-26 DIAGNOSIS — R2681 Unsteadiness on feet: Secondary | ICD-10-CM

## 2021-05-26 NOTE — Therapy (Signed)
?OUTPATIENT PHYSICAL THERAPY TREATMENT NOTE ? ? ?Patient Name: Donna [name] ?MRN: [id number] ?DOB:[birth year], 72 y.o., female ?Today's Date: 05/26/2021 ? ?PCP: [facility] ?REFERRING PROVIDER: Delford Field, FNP ? ?END OF SESSION:  ? PT End of Session - 05/26/21 1407   ? ? Visit Number 6   ? Number of Visits 17   ? Date for PT Re-Evaluation 06/18/21   ? Authorization Type UHC Medicare and Medicaid (needs progress note)   ? Progress Note Due on Visit 10   ? PT Start Time 5009   ? PT Stop Time 3818   ? PT Time Calculation (min) 42 min   ? Equipment Utilized During Treatment Gait belt   ? Activity Tolerance Patient tolerated treatment well   ? Behavior During Therapy [facility] for tasks assessed/performed   ? ?  ?  ? ?  ? ? ? ?Past Medical History:  ?Diagnosis Date  ? Allergy   ? Anxiety   ? Depression   ? Hyperlipidemia   ? no meds   ? Hypertension   ? ?Past Surgical History:  ?Procedure Laterality Date  ? DENTAL SURGERY    ? NSVD    ? x2  ? [place]  ? ?Patient Active Problem List  ? Diagnosis Date Noted  ? Cough   ? Cigarette nicotine dependence without complication   ? Stroke ([place]) 05/25/2020  ? Primary hypertension   ? ? ?REFERRING DIAG: I69.354 (ICD-10-CM) - Hemiplegia and hemiparesis following cerebral infarction affecting left non-dominant side  ? ?THERAPY DIAG:  ?Hemiplegia and hemiparesis following cerebral infarction affecting left non-dominant side ([place]) ? ?Unsteadiness on feet ? ?Other abnormalities of gait and mobility ? ?PERTINENT HISTORY: Anxiety, Depression, Hyperlipidemia, Hypertension ? ?PRECAUTIONS: Fall ? ?SUBJECTIVE: She is dog-sitting her granddaughters dog.  No falls, dog is not causing tripping. ? ?PAIN:  ?Are you having pain? No ? ? ?OBJECTIVE: (objective measures completed at initial evaluation unless otherwise dated) ? ?TODAY'S TREATMENT:  ?Ther Ex:  ?Performed seated scifit stepper x 3 mins at L3.0 + 3 mins at L3.5 resistance with BUEs/BLEs for overall  strengthening and endurance, but also to assist with flexion pattern to reduce tone.  Pt requires 1 seated rest x30 seconds midway through, prolonged seated rest break following activity to allow pt to slow rate of respirations. ?Hip flexor stretch supine off long EOM 2x45 seconds on LLE ?Quad stretch in side-lying (activity d/c'd due to pt difficulty w/ position), modified to passive quad stretch in prone w/ pt tolerating well 3x45sec w/ progressed depth of stretch w/ each rep ?Prone hamstring curls cued for 3 sec hold x12 ?Pt transitions EOM<>prone independently w/ inc time to complete task.  Mildly winded upon return to EOM. ?Countertop lunges in modified decreased depth for strengthening and flexion pattern x12 each LE, used visual cued on floor for increased step length ? ? ? ?PATIENT EDUCATION: ?Education details:  Continue HEP especially stretching. ?Person educated: Patient ?Education method: Explanation ?Education comprehension: verbalized understanding ?  ?  ?HOME EXERCISE PROGRAM: ?Access Code: [email or web address] ?URL: [email or web address] ?Date: 05/12/2021 ?Prepared by: Mickie Bail Plaster ? ?Exercises ?- Seated Gastroc Stretch with Strap  - 1 x daily - 5 x weekly - 2 sets - 2 reps - 30 seconds hold ?- Heel Raises with Counter Support  - 1 x daily - 4 x weekly - 12 reps ?- Seated Hamstring Curls with Resistance  - 1 x daily - 5 x weekly - 2 sets - 10 reps ?-  Standing Single Leg Stance with Counter Support  - 1 x daily - 5 x weekly - 1 sets - 2 reps - 30 seconds hold ?- Standing March with Counter Support  - 1 x daily - 5 x weekly - 2 sets - 20 reps ?- Supine Quadriceps Stretch with Strap on Table  - 1 x daily - 7 x weekly - 3 sets - 10 reps ?- Modified Thomas Stretch  - 1 x daily - 7 x weekly - 1 sets - 3 reps - 30 secs hold ?  ?  ?  ?GOALS: ?Goals reviewed with patient? Yes ?  ?SHORT TERM GOALS: Target date: 05/17/2021 ?  ?Pt will be IND with initial HEP in order to indicate improved functional mobility  and dec fall risk. ?Baseline: ?Goal status: MET  ?  ?2.  Pt will improve gait speed to >/=2.0 ft/sec w/ quad tip cane (and AFO if appropriate) in order to indicate dec fall risk. ?  ?Baseline: 1.60 ft/sec with quad tip cane; 2.01 ft/s w/quad tip cane and no AFO ?Goal status: MET ?  ?3.  Will assess DGI and improve score by 3 points in order to indicate dec fall risk.  ?Baseline: 12/24 ?Goal status: INITIAL ?  ?4.  Pt will negotiate up/down 4 steps with single rail/cane, up/down ramp and curb and ambulate x 500' outdoors over unlevel paved surfaces at S level with quad tip cane in order to indicate improved community mobility.  ?  ?Baseline: See above (05/12/21) ?Goal status: MET ?  ?5.  Will assess gait with use of foot up brace/AFO to determine if this will dec fall risk and improve gait efficiency.   ?Baseline: Assessed w/ L Ottobock reaction AFO-may not be best option ?Goal status: MET ?  ?  ?  ?LONG TERM GOALS: Target date: 06/14/2021 ?  ?Pt will be IND with final HEP in order to indicate improved functional mobility and dec fall risk. ?Baseline:  ?Goal status: INITIAL ?  ?2.  Pt will improve gait speed to >/=2.62 ft/sec w/LRAD in order to indicate dec fall risk. ?  ?Baseline:  ?Goal status: INITIAL ?  ?3.  Pt will negotiate up/down 8 steps with cane, up/down ramp and curb and ambulate x 500' outdoors over unlevel paved and grassy surfaces w/ LRAD at mod I level in order to indicate improved community mobility.  ?  ?Baseline:  ?Goal status: INITIAL ?  ?4.  Pt will improve DGI by 6 points from baseline in order to indicate dec fall risk.  ?Baseline: 12/24 ?Goal status: INITIAL ?  ?5.  Pt will ambulate x 100' w/ out AD over indoor surfaces in order to indicate improved household independence.  ?Baseline:  ?Goal status: INITIAL ?  ?  ?  ?ASSESSMENT: ?  ?CLINICAL IMPRESSION: ?Focus of skilled session on therapeutic exercise to promote LLE flexion patterns, strengthening, and stretching to address tone and abnormal  gait and functional movement deficits.  Pt continues to demonstrates increased extensor tone in weight bearing positions, but is able to unlock the knee without LOB or other issue.  Will continue to address deficits per POC. ?  ?  ?OBJECTIVE IMPAIRMENTS Abnormal gait, decreased activity tolerance, decreased balance, decreased mobility, decreased ROM, decreased strength, impaired flexibility, impaired UE functional use, and postural dysfunction.  ?  ?ACTIVITY LIMITATIONS driving, occupation, and shopping.  ?  ?PERSONAL FACTORS Time since onset of injury/illness/exacerbation and 3+ comorbidities: see above  are also affecting patient's functional outcome.  ?  ?  ?  REHAB POTENTIAL: Good ?  ?CLINICAL DECISION MAKING: Evolving/moderate complexity ?  ?EVALUATION COMPLEXITY: Moderate ?  ?PLAN: ?PT FREQUENCY: 2x/week ?  ?PT DURATION: 8 weeks ?  ?PLANNED INTERVENTIONS: Therapeutic exercises, Therapeutic activity, Neuromuscular re-education, Balance training, Gait training, Patient/Family education, Vestibular training, Orthotic/Fit training, DME instructions, Aquatic Therapy, and Manual therapy ?  ?PLAN FOR NEXT SESSION: Does DGI need to be assessed w/ STG updated?  stretching and flexion patterns to reduce tone (sci fit before NMR), continue to work with L ottobock reaction AFO (not sure if this is best option but does help a little), LLE NMR, hip flex stretch/quad stretch.  Gait with cane and without, balance  ? ?Elease Etienne, PT, DPT ? ?[place] ?[place], [place], [address] ?Phone: [phone]   Fax:  [phone] ?05/26/21, 4:08 PM ? ? ? ?  ? ?

## 2021-05-31 ENCOUNTER — Ambulatory Visit: Payer: 59 | Admitting: Rehabilitation

## 2021-05-31 ENCOUNTER — Encounter: Payer: Self-pay | Admitting: Rehabilitation

## 2021-05-31 DIAGNOSIS — R2681 Unsteadiness on feet: Secondary | ICD-10-CM

## 2021-05-31 DIAGNOSIS — I69354 Hemiplegia and hemiparesis following cerebral infarction affecting left non-dominant side: Secondary | ICD-10-CM | POA: Diagnosis not present

## 2021-05-31 DIAGNOSIS — R2689 Other abnormalities of gait and mobility: Secondary | ICD-10-CM

## 2021-05-31 NOTE — Therapy (Signed)
?OUTPATIENT PHYSICAL THERAPY TREATMENT NOTE ? ? ?Patient Name: Donna [name] ?MRN: [id number] ?DOB:1950-11-07, 71 y.o., female ?Today's Date: 05/31/2021 ? ?PCP: [facility] ?REFERRING PROVIDER: Care, Jinny Blossom Tota* ? ?END OF SESSION:  ? PT End of Session - 05/31/21 1407   ? ? Visit Number 7   ? Number of Visits 17   ? Date for PT Re-Evaluation 06/18/21   ? Authorization Type UHC Medicare and Medicaid (needs progress note)   ? Progress Note Due on Visit 10   ? PT Start Time 1401   ? PT Stop Time 8828   ? PT Time Calculation (min) 41 min   ? Equipment Utilized During Treatment Gait belt   ? Activity Tolerance Patient tolerated treatment well   ? Behavior During Therapy [facility] for tasks assessed/performed   ? ?  ?  ? ?  ? ? ? ?Past Medical History:  ?Diagnosis Date  ? Allergy   ? Anxiety   ? Depression   ? Hyperlipidemia   ? no meds   ? Hypertension   ? ?Past Surgical History:  ?Procedure Laterality Date  ? DENTAL SURGERY    ? NSVD    ? x2  ? [place]  ? ?Patient Active Problem List  ? Diagnosis Date Noted  ? Cough   ? Cigarette nicotine dependence without complication   ? Stroke ([place]) 05/25/2020  ? Primary hypertension   ? ? ?REFERRING DIAG: I69.354 (ICD-10-CM) - Hemiplegia and hemiparesis following cerebral infarction affecting left non-dominant side  ? ?THERAPY DIAG:  ?Hemiplegia and hemiparesis following cerebral infarction affecting left non-dominant side ([place]) ? ?Unsteadiness on feet ? ?Other abnormalities of gait and mobility ? ?PERTINENT HISTORY: Anxiety, Depression, Hyperlipidemia, Hypertension ? ?PRECAUTIONS: Fall ? ?SUBJECTIVE: She is dog-sitting her granddaughters dog.  No falls, dog is not causing tripping. ? ?PAIN:  ?Are you having pain? No ? ? ?OBJECTIVE: (objective measures completed at initial evaluation unless otherwise dated) ? ?TODAY'S TREATMENT:  ?Ther Ex:  ?Performed seated scifit stepper x 6 mins at level 3 resistance with BUEs/BLEs for overall strengthening and  endurance, but also to assist with flexion pattern to reduce tone.  Pt able to keep steps per minute in 80's throughout.  She only took one very short break in between.   ? ?NMR:  Weighted LLE with 6lbs during all balance and gait tasks for high intensity gait training.  HR did not get higher than 107 during session and SaO2 remained in 90's throughout.  Performed gait x 230' x 2 reps (one at beginning of session and again at the end) with L HHA with cues for increased pace and "kicking" leg through.  Performed LLE stepping over black balance beam x 15 reps with cues for increased lift, esp when returning.  Stepping to varying spots on the floor with R and LLE called out at random by PT with min A and cues for single step to return rather than small steps.  Standing on foam airex alt forward steps x 20 reps, alt backwards steps x 20 reps with single UE support and cues for increased lift on LLE.   ? ?Then re-assessed gait speed and was 2.1 ft/sec without device with min/guard vs 0.98 ft/sec without device with min A on eval.  ? ? ?PATIENT EDUCATION: ?Education details:  Continue HEP especially stretching. ?Person educated: Patient ?Education method: Explanation ?Education comprehension: verbalized understanding ?  ?  ?HOME EXERCISE PROGRAM: ?Access Code: [email or web address] ?URL: [email or web address] ?Date: 05/12/2021 ?  Prepared by: Mickie Bail Plaster ? ?Exercises ?- Seated Gastroc Stretch with Strap  - 1 x daily - 5 x weekly - 2 sets - 2 reps - 30 seconds hold ?- Heel Raises with Counter Support  - 1 x daily - 4 x weekly - 12 reps ?- Seated Hamstring Curls with Resistance  - 1 x daily - 5 x weekly - 2 sets - 10 reps ?- Standing Single Leg Stance with Counter Support  - 1 x daily - 5 x weekly - 1 sets - 2 reps - 30 seconds hold ?- Standing March with Counter Support  - 1 x daily - 5 x weekly - 2 sets - 20 reps ?- Supine Quadriceps Stretch with Strap on Table  - 1 x daily - 7 x weekly - 3 sets - 10 reps ?- Modified  Thomas Stretch  - 1 x daily - 7 x weekly - 1 sets - 3 reps - 30 secs hold ?  ?  ?  ?GOALS: ?Goals reviewed with patient? Yes ?  ?SHORT TERM GOALS: Target date: 05/17/2021 ?  ?Pt will be IND with initial HEP in order to indicate improved functional mobility and dec fall risk. ?Baseline: ?Goal status: MET  ?  ?2.  Pt will improve gait speed to >/=2.0 ft/sec w/ quad tip cane (and AFO if appropriate) in order to indicate dec fall risk. ?  ?Baseline: 1.60 ft/sec with quad tip cane; 2.01 ft/s w/quad tip cane and no AFO ?Goal status: MET ?  ?3.  Will assess DGI and improve score by 3 points in order to indicate dec fall risk.  ?Baseline: 12/24 ?Goal status: not met  ?  ?4.  Pt will negotiate up/down 4 steps with single rail/cane, up/down ramp and curb and ambulate x 500' outdoors over unlevel paved surfaces at S level with quad tip cane in order to indicate improved community mobility.  ?  ?Baseline: See above (05/12/21) ?Goal status: MET ?  ?5.  Will assess gait with use of foot up brace/AFO to determine if this will dec fall risk and improve gait efficiency.   ?Baseline: Assessed w/ L Ottobock reaction AFO-may not be best option ?Goal status: MET ?  ?  ?  ?LONG TERM GOALS: Target date: 06/14/2021 ?  ?Pt will be IND with final HEP in order to indicate improved functional mobility and dec fall risk. ?Baseline:  ?Goal status: INITIAL ?  ?2.  Pt will improve gait speed to >/=2.62 ft/sec w/LRAD in order to indicate dec fall risk. ?  ?Baseline:  ?Goal status: INITIAL ?  ?3.  Pt will negotiate up/down 8 steps with cane, up/down ramp and curb and ambulate x 500' outdoors over unlevel paved and grassy surfaces w/ LRAD at mod I level in order to indicate improved community mobility.  ?  ?Baseline:  ?Goal status: INITIAL ?  ?4.  Pt will improve DGI by 6 points from baseline in order to indicate dec fall risk.  ?Baseline: 12/24 ?Goal status: INITIAL ?  ?5.  Pt will ambulate x 100' w/ out AD over indoor surfaces in order to indicate  improved household independence.  ?Baseline:  ?Goal status: INITIAL ?  ?  ?  ?ASSESSMENT: ?  ?CLINICAL IMPRESSION: ?Skilled session focused on high intensity gait and balance training with use of 6lb weight on LLE during all tasks.  Pt with moderate to max fatigue needing intermittent rest breaks, but HR and SaO2 remained stable throughout session.  Pt tolerated well and demonstrated improved  gait speed at end of session.  ?  ?  ?OBJECTIVE IMPAIRMENTS Abnormal gait, decreased activity tolerance, decreased balance, decreased mobility, decreased ROM, decreased strength, impaired flexibility, impaired UE functional use, and postural dysfunction.  ?  ?ACTIVITY LIMITATIONS driving, occupation, and shopping.  ?  ?PERSONAL FACTORS Time since onset of injury/illness/exacerbation and 3+ comorbidities: see above  are also affecting patient's functional outcome.  ?  ?  ?REHAB POTENTIAL: Good ?  ?CLINICAL DECISION MAKING: Evolving/moderate complexity ?  ?EVALUATION COMPLEXITY: Moderate ?  ?PLAN: ?PT FREQUENCY: 2x/week ?  ?PT DURATION: 8 weeks ?  ?PLANNED INTERVENTIONS: Therapeutic exercises, Therapeutic activity, Neuromuscular re-education, Balance training, Gait training, Patient/Family education, Vestibular training, Orthotic/Fit training, DME instructions, Aquatic Therapy, and Manual therapy ?  ?PLAN FOR NEXT SESSION: stretching and flexion patterns to reduce tone (sci fit before NMR), continue to work with L ottobock reaction AFO (not sure if this is best option but does help a little), LLE NMR-could continue with high intensity gait training (resisted gait-have help, treadmill with weight on LLE), hip flex stretch/quad stretch.  Gait with cane and without, balance  ? ?Cameron Sprang, PT, MPT ?[place] ?[place], [place], [address] ?Phone: [phone]   Fax:  [phone] ?05/31/21, 3:58 PM ? ? ? ? ?  ? ?

## 2021-06-02 ENCOUNTER — Encounter: Payer: Self-pay | Admitting: Physical Therapy

## 2021-06-02 ENCOUNTER — Ambulatory Visit: Payer: 59 | Admitting: Physical Therapy

## 2021-06-02 VITALS — BP 172/77 | HR 75

## 2021-06-02 DIAGNOSIS — I69354 Hemiplegia and hemiparesis following cerebral infarction affecting left non-dominant side: Secondary | ICD-10-CM

## 2021-06-02 DIAGNOSIS — R2689 Other abnormalities of gait and mobility: Secondary | ICD-10-CM

## 2021-06-02 DIAGNOSIS — R2681 Unsteadiness on feet: Secondary | ICD-10-CM

## 2021-06-02 NOTE — Therapy (Signed)
OUTPATIENT PHYSICAL THERAPY TREATMENT NOTE   Patient Name: Donna [name] MRN: [id number] DOB:1950/11/06, 71 y.o., female Today's Date: 06/02/2021  PCP: [facility], [facility] REFERRING PROVIDER: Care, Jinny Blossom Tota*  END OF SESSION:   PT End of Session - 06/02/21 1408     Visit Number 8    Number of Visits 17    Date for PT Re-Evaluation 06/18/21    Authorization Type UHC Medicare and Medicaid (needs progress note)    Progress Note Due on Visit 10    PT Start Time 1404    PT Stop Time 1447    PT Time Calculation (min) 43 min    Equipment Utilized During Treatment Gait belt    Activity Tolerance Patient tolerated treatment well    Behavior During Therapy WFL for tasks assessed/performed              Past Medical History:  Diagnosis Date   Allergy    Anxiety    Depression    Hyperlipidemia    no meds    Hypertension    Past Surgical History:  Procedure Laterality Date   DENTAL SURGERY     NSVD     x2   TUBAL LIGATION  1976   Patient Active Problem List   Diagnosis Date Noted   Cough    Cigarette nicotine dependence without complication    Stroke ([place]) 05/25/2020   Primary hypertension     REFERRING DIAG: D40.814 (ICD-10-CM) - Hemiplegia and hemiparesis following cerebral infarction affecting left non-dominant side   THERAPY DIAG:  Hemiplegia and hemiparesis following cerebral infarction affecting left non-dominant side ([facility])  Unsteadiness on feet  Other abnormalities of gait and mobility  PERTINENT HISTORY: Anxiety, Depression, Hyperlipidemia, Hypertension  PRECAUTIONS: Fall  SUBJECTIVE: No falls.  She has been walking and doing her home exercises.  PAIN:  Are you having pain? No  VITALS: RUE in sitting: Today's Vitals   06/02/21 1415  BP: (!) 172/77  Pulse: 75  Pt states she is worried about her children who are currently out of the country. OBJECTIVE: (objective measures completed at initial evaluation unless otherwise  dated)  TODAY'S TREATMENT:  Ther Ex:  Treadmill training performed with 6# ankle weight on LLE using BUE support x64mns, mod multimodal cues for posture, step length, and heel strike for BLE strengthening, activity tolerance and maintenance of consistent gait speed.  Pt requires single standing rest break at 5 min mark w/ SpO2 of 98% and HR of 97; reassessed at end of activity w/ SpO2 of 96% w/ HR of 98  NMR:   Pt ambulates on level surface in gym 115' x 2 reps (one prior to treadmill training and one following for overground carryover) no AD.  Pt demonstrates improved mechanics requiring single verbal cue for LLE clearance.  Therapist provides minA for prolonged weight shift onto LLE during stance to improved R step length. Standing EOM on LLE performing RLE taps to 2 gumdrops on floor then returning to bilateral stance 2x10 completed.  Attempted 2x6 w/ RLE in stance to challenge LLE coordination and hip flexor strength with inc time to complete with pt only able to tap one gumdrop at a time before resting leg. RLE lateral taps to gumdrop x15 to promote LLE SLS RLE elevated on 4" step performing head turns and nods; vertigo reported w/ LOB x1 with initial head nods, resolved in < 1 min Attempted rebounder x2 w/ pt having difficulty engaging left UE to catch ball so activity  d/c'd     PATIENT EDUCATION: Education details:  Continue HEP especially stretching. Person educated: Patient Education method: Explanation Education comprehension: verbalized understanding     HOME EXERCISE PROGRAM: Access Code: [email or web address] URL: [email or web address] Date: 05/12/2021 Prepared by: Mickie Bail Plaster  Exercises - Seated Gastroc Stretch with Strap  - 1 x daily - 5 x weekly - 2 sets - 2 reps - 30 seconds hold - Heel Raises with Counter Support  - 1 x daily - 4 x weekly - 12 reps - Seated Hamstring Curls with Resistance  - 1 x daily - 5 x weekly - 2 sets - 10 reps - Standing Single Leg Stance  with Counter Support  - 1 x daily - 5 x weekly - 1 sets - 2 reps - 30 seconds hold - Standing March with Counter Support  - 1 x daily - 5 x weekly - 2 sets - 20 reps - Supine Quadriceps Stretch with Strap on Table  - 1 x daily - 7 x weekly - 3 sets - 10 reps - Modified Thomas Stretch  - 1 x daily - 7 x weekly - 1 sets - 3 reps - 30 secs hold       GOALS: Goals reviewed with patient? Yes   SHORT TERM GOALS: Target date: 05/17/2021   Pt will be IND with initial HEP in order to indicate improved functional mobility and dec fall risk. Baseline: Goal status: MET    2.  Pt will improve gait speed to >/=2.0 ft/sec w/ quad tip cane (and AFO if appropriate) in order to indicate dec fall risk.   Baseline: 1.60 ft/sec with quad tip cane; 2.01 ft/s w/quad tip cane and no AFO Goal status: MET   3.  Will assess DGI and improve score by 3 points in order to indicate dec fall risk.  Baseline: 12/24 Goal status: not met    4.  Pt will negotiate up/down 4 steps with single rail/cane, up/down ramp and curb and ambulate x 500' outdoors over unlevel paved surfaces at S level with quad tip cane in order to indicate improved community mobility.    Baseline: See above (05/12/21) Goal status: MET   5.  Will assess gait with use of foot up brace/AFO to determine if this will dec fall risk and improve gait efficiency.   Baseline: Assessed w/ L Ottobock reaction AFO-may not be best option Goal status: MET       LONG TERM GOALS: Target date: 06/14/2021   Pt will be IND with final HEP in order to indicate improved functional mobility and dec fall risk. Baseline:  Goal status: INITIAL   2.  Pt will improve gait speed to >/=2.62 ft/sec w/LRAD in order to indicate dec fall risk.   Baseline:  Goal status: INITIAL   3.  Pt will negotiate up/down 8 steps with cane, up/down ramp and curb and ambulate x 500' outdoors over unlevel paved and grassy surfaces w/ LRAD at mod I level in order to indicate improved  community mobility.    Baseline:  Goal status: INITIAL   4.  Pt will improve DGI by 6 points from baseline in order to indicate dec fall risk.  Baseline: 12/24 Goal status: INITIAL   5.  Pt will ambulate x 100' w/ out AD over indoor surfaces in order to indicate improved household independence.  Baseline:  Goal status: INITIAL       ASSESSMENT:   CLINICAL IMPRESSION: This session focused on promotion of single leg  stability to strengthen the LLE and improve static balance.  Continued high intensity gait training this session with use of treadmill to promote maintenance of consistence gait speed with proper LE mechanics.  She continues to require intermittent cuing to engage hip and knee flexors during swing phase.  Pt experienced brief bout of vertigo at end of session with head nods.       OBJECTIVE IMPAIRMENTS Abnormal gait, decreased activity tolerance, decreased balance, decreased mobility, decreased ROM, decreased strength, impaired flexibility, impaired UE functional use, and postural dysfunction.    ACTIVITY LIMITATIONS driving, occupation, and shopping.    PERSONAL FACTORS Time since onset of injury/illness/exacerbation and 3+ comorbidities: see above  are also affecting patient's functional outcome.      REHAB POTENTIAL: Good   CLINICAL DECISION MAKING: Evolving/moderate complexity   EVALUATION COMPLEXITY: Moderate   PLAN: PT FREQUENCY: 2x/week   PT DURATION: 8 weeks   PLANNED INTERVENTIONS: Therapeutic exercises, Therapeutic activity, Neuromuscular re-education, Balance training, Gait training, Patient/Family education, Vestibular training, Orthotic/Fit training, DME instructions, Aquatic Therapy, and Manual therapy   PLAN FOR NEXT SESSION: She would benefit from dual tasking being added to walking or other low intensity task.  stretching and flexion patterns to reduce tone (sci fit before NMR), continue to work with L ottobock reaction AFO (not sure if this is best  option but does help a little), LLE NMR-could continue with high intensity gait training (resisted gait-have help, treadmill with weight on LLE), hip flex stretch/quad stretch.  Gait with cane and without, balance   Elease Etienne, PT, DPT [facility] [address] [place], [place], [address] Phone: [phone]   Fax:  [phone] 06/02/21, 3:20 PM

## 2021-06-07 ENCOUNTER — Encounter: Payer: Self-pay | Admitting: Physical Therapy

## 2021-06-07 ENCOUNTER — Ambulatory Visit: Payer: 59 | Admitting: Physical Therapy

## 2021-06-07 VITALS — BP 134/71 | HR 82

## 2021-06-07 DIAGNOSIS — R2689 Other abnormalities of gait and mobility: Secondary | ICD-10-CM

## 2021-06-07 DIAGNOSIS — I69354 Hemiplegia and hemiparesis following cerebral infarction affecting left non-dominant side: Secondary | ICD-10-CM

## 2021-06-07 DIAGNOSIS — R2681 Unsteadiness on feet: Secondary | ICD-10-CM

## 2021-06-07 NOTE — Therapy (Unsigned)
OUTPATIENT PHYSICAL THERAPY TREATMENT NOTE   Patient Name: Donna [name] MRN: [id number] DOB:12/20/1950, 71 y.o., female Today's Date: 06/08/2021  PCP: Care, Jinny Blossom [facility] REFERRING PROVIDER: Care, Jinny Blossom Tota*  END OF SESSION:   PT End of Session - 06/07/21 1405     Visit Number 9    Number of Visits 17    Date for PT Re-Evaluation 06/18/21    Authorization Type UHC Medicare and Medicaid (needs progress note)    Progress Note Due on Visit 10    PT Start Time 1402    PT Stop Time 1443    PT Time Calculation (min) 41 min    Equipment Utilized During Treatment Gait belt    Activity Tolerance Patient tolerated treatment well    Behavior During Therapy WFL for tasks assessed/performed              Past Medical History:  Diagnosis Date   Allergy    Anxiety    Depression    Hyperlipidemia    no meds    Hypertension    Past Surgical History:  Procedure Laterality Date   DENTAL SURGERY     NSVD     x2   TUBAL LIGATION  1976   Patient Active Problem List   Diagnosis Date Noted   Cough    Cigarette nicotine dependence without complication    Stroke ([place]) 05/25/2020   Primary hypertension     REFERRING DIAG: T62.263 (ICD-10-CM) - Hemiplegia and hemiparesis following cerebral infarction affecting left non-dominant side   THERAPY DIAG:  Hemiplegia and hemiparesis following cerebral infarction affecting left non-dominant side (HCC)  Unsteadiness on feet  Other abnormalities of gait and mobility  PERTINENT HISTORY: Anxiety, Depression, Hyperlipidemia, Hypertension  PRECAUTIONS: Fall  SUBJECTIVE: No falls.  Reports her right leg was tired from the treadmill last session. HEP is going well.  PAIN:  Are you having pain? No     TODAY'S TREATMENT:   06/07/2021 STRENGTHENING  Scifit UE/LE's level 3.0 x 6 minutes with goal >/= 85 steps for strengthening, activity tolerance and reciprocal movements to address tone of left LE.  Lying at  edge of mat: modified Thomas stretch for left LE for 1 minute In prone on mat table - HS curls with min assist for controlled movements x 10 reps - Glut kick backs x 10 reps with assist to keep LE in correct placement and for controlled movements   BALANCE/NMR: In parallel bars with bil UE, feet in wide staggered stance with left foot back- red band resisted forward stepping foot to 6 inch box<>back to starting position for 15 reps Gait around track with red band resistance to left LE with facilitation at pelvis for weight shifting/hand to shoulder for posture and cues increased right step length. Then use of blue band for increased resistance with slight improvement in knee flexion with swing phase noted vs with red band, cues/facilitation as stated with 1st rep.       PATIENT EDUCATION: Education details:  Continue HEP especially stretching; bring in name of muscle relaxer next session.  Person educated: Patient Education method: Explanation Education comprehension: verbalized understanding     HOME EXERCISE PROGRAM: Access Code: [email or web address] URL: [email or web address] Date: 05/12/2021 Prepared by: Mickie Bail Plaster  Exercises - Seated Gastroc Stretch with Strap  - 1 x daily - 5 x weekly - 2 sets - 2 reps - 30 seconds hold - Heel Raises with Counter Support  - 1 x daily - 4 x weekly -  12 reps - Seated Hamstring Curls with Resistance  - 1 x daily - 5 x weekly - 2 sets - 10 reps - Standing Single Leg Stance with Counter Support  - 1 x daily - 5 x weekly - 1 sets - 2 reps - 30 seconds hold - Standing March with Counter Support  - 1 x daily - 5 x weekly - 2 sets - 20 reps - Supine Quadriceps Stretch with Strap on Table  - 1 x daily - 7 x weekly - 3 sets - 10 reps - Modified Thomas Stretch  - 1 x daily - 7 x weekly - 1 sets - 3 reps - 30 secs hold       GOALS: Goals reviewed with patient? Yes   SHORT TERM GOALS: Target date: 05/17/2021   Pt will be IND with initial HEP in  order to indicate improved functional mobility and dec fall risk. Baseline: Goal status: MET    2.  Pt will improve gait speed to >/=2.0 ft/sec w/ quad tip cane (and AFO if appropriate) in order to indicate dec fall risk.   Baseline: 1.60 ft/sec with quad tip cane; 2.01 ft/s w/quad tip cane and no AFO Goal status: MET   3.  Will assess DGI and improve score by 3 points in order to indicate dec fall risk.  Baseline: 12/24 Goal status: not met    4.  Pt will negotiate up/down 4 steps with single rail/cane, up/down ramp and curb and ambulate x 500' outdoors over unlevel paved surfaces at S level with quad tip cane in order to indicate improved community mobility.    Baseline: See above (05/12/21) Goal status: MET   5.  Will assess gait with use of foot up brace/AFO to determine if this will dec fall risk and improve gait efficiency.   Baseline: Assessed w/ L Ottobock reaction AFO-may not be best option Goal status: MET       LONG TERM GOALS: Target date: 06/14/2021   Pt will be IND with final HEP in order to indicate improved functional mobility and dec fall risk. Baseline:  Goal status: INITIAL   2.  Pt will improve gait speed to >/=2.62 ft/sec w/LRAD in order to indicate dec fall risk.   Baseline:  Goal status: INITIAL   3.  Pt will negotiate up/down 8 steps with cane, up/down ramp and curb and ambulate x 500' outdoors over unlevel paved and grassy surfaces w/ LRAD at mod I level in order to indicate improved community mobility.    Baseline:  Goal status: INITIAL   4.  Pt will improve DGI by 6 points from baseline in order to indicate dec fall risk.  Baseline: 12/24 Goal status: INITIAL   5.  Pt will ambulate x 100' w/ out AD over indoor surfaces in order to indicate improved household independence.  Baseline:  Goal status: INITIAL       ASSESSMENT:   CLINICAL IMPRESSION: Today's skilled session continued to focus on LE stretching, strengthening and gait with  emphasis on hip/knee flexion with swing phase. No issues noted or reported in session. The pt should benefit from continued PT to progress toward unmet goals.      OBJECTIVE IMPAIRMENTS Abnormal gait, decreased activity tolerance, decreased balance, decreased mobility, decreased ROM, decreased strength, impaired flexibility, impaired UE functional use, and postural dysfunction.    ACTIVITY LIMITATIONS driving, occupation, and shopping.    PERSONAL FACTORS Time since onset of injury/illness/exacerbation and 3+ comorbidities: see above  are also affecting patient's functional outcome.      REHAB POTENTIAL: Good   CLINICAL DECISION MAKING: Evolving/moderate complexity   EVALUATION COMPLEXITY: Moderate   PLAN: PT FREQUENCY: 2x/week   PT DURATION: 8 weeks   PLANNED INTERVENTIONS: Therapeutic exercises, Therapeutic activity, Neuromuscular re-education, Balance training, Gait training, Patient/Family education, Vestibular training, Orthotic/Fit training, DME instructions, Aquatic Therapy, and Manual therapy   PLAN FOR NEXT SESSION: 10th visit progress note due next session. LTGs due next week 06/14/21. Try theraband assist DF, hip/knee flexion with gait and standing balance tasks. She would benefit from dual tasking being added to walking or other low intensity task.  stretching and flexion patterns to reduce tone (sci fit before NMR), continue to work with L ottobock reaction AFO (not sure if this is best option but does help a little), LLE NMR-could continue with high intensity gait training (resisted gait-have help, treadmill with weight on LLE), hip flex stretch/quad stretch.  Gait with cane and without, balance   Willow Ora, PTA, [facility] [address] [place], [place] [address] [phone] 06/08/21, 10:43 AM

## 2021-06-09 ENCOUNTER — Encounter: Payer: Self-pay | Admitting: Physical Therapy

## 2021-06-09 ENCOUNTER — Ambulatory Visit: Payer: 59 | Admitting: Physical Therapy

## 2021-06-09 DIAGNOSIS — I69354 Hemiplegia and hemiparesis following cerebral infarction affecting left non-dominant side: Secondary | ICD-10-CM

## 2021-06-09 DIAGNOSIS — R2689 Other abnormalities of gait and mobility: Secondary | ICD-10-CM

## 2021-06-09 DIAGNOSIS — R2681 Unsteadiness on feet: Secondary | ICD-10-CM

## 2021-06-09 NOTE — Therapy (Signed)
OUTPATIENT PHYSICAL THERAPY TREATMENT NOTE/PROGRESS NOTE/RE-CERT   Patient Name: Donna [name] MRN: [id number] DOB:[birth year], 71 y.o., female Today's Date: 06/09/2021  PCP: Care, Jinny Blossom [facility] REFERRING PROVIDER: Delford Field, FNP  PT progress note for Donna [name].  Reporting period 04/19/2021 to 06/09/2021  See Note below for Objective Data and Assessment of Progress/Goals  Thank you for the referral of this patient. Donna [name], PT, DPT   END OF SESSION:   PT End of Session - 06/09/21 1405     Visit Number 10    Number of Visits 33    Date for PT Re-Evaluation 08/25/21    Authorization Type UHC Medicare and Medicaid (needs progress note)    Progress Note Due on Visit 20    PT Start Time 1401    PT Stop Time 1455    PT Time Calculation (min) 54 min    Equipment Utilized During Treatment Gait belt    Activity Tolerance Patient tolerated treatment well    Behavior During Therapy WFL for tasks assessed/performed              Past Medical History:  Diagnosis Date   Allergy    Anxiety    Depression    Hyperlipidemia    no meds    Hypertension    Past Surgical History:  Procedure Laterality Date   DENTAL SURGERY     NSVD     x2   TUBAL LIGATION  1976   Patient Active Problem List   Diagnosis Date Noted   Cough    Cigarette nicotine dependence without complication    Stroke ([place]) 05/25/2020   Primary hypertension     REFERRING DIAG: M42.683 (ICD-10-CM) - Hemiplegia and hemiparesis following cerebral infarction affecting left non-dominant side   THERAPY DIAG:  Hemiplegia and hemiparesis following cerebral infarction affecting left non-dominant side ([place]) - Plan: PT plan of care cert/re-cert  Unsteadiness on feet - Plan: PT plan of care cert/re-cert  Other abnormalities of gait and mobility - Plan: PT plan of care cert/re-cert  PERTINENT HISTORY: Anxiety, Depression, Hyperlipidemia, Hypertension  PRECAUTIONS:  Fall  SUBJECTIVE: HEP remains mildly challenging and she is walking a little more each day.  She continues to work on bending the knee at home.  She confirms Cymbalta is the medication the PTA requested the name of last visit, pt states it is helping the scrunching of her toes..  No falls since last visit.  PAIN:  Are you having pain? No     TODAY'S TREATMENT:  Time spent discussing re-cert and continued frequency, progress towards goals and updated goals.   LTGs assessed today for progress note and re-cert: Verbally reviewed HEP with patient. 10MWT w/ quad cane:  16.22sec = 0.62 m/sec OR 2.03 ft/sec 10MWT w/o quad cane:  18.93sec = 0.53 m/sec OR 1.74 ft/sec RAMP:  Level of Assistance: SBA and CGA Assistive device utilized: Quad cane small base -quad tip cane on right Ramp Comments: SBA during ascent, CGA during descent as pt has trouble managing speed of steps  CURB:  Level of Assistance: CGA Assistive device utilized: Quad cane small base -quad tip cane on right Curb Comments: Inc posterior lean when stepping down  STAIRS:  Level of Assistance: SBA and CGA  Stair Negotiation Technique: Step to Pattern with No Rails -quad tip cane on right  Number of Stairs: 8   Height of Stairs: 6"  Comments: pt leans on rail posterolaterally w/ hip when descending stairs, when cued to prevent  lean pt requires CGA for safety  GAIT: Gait pattern: step to pattern, step through pattern, decreased arm swing- Left, decreased stance time- Left, decreased hip/knee flexion- Left, decreased ankle dorsiflexion- Left, circumduction- Left, and Left foot flat Distance walked: 250' + 100' + 150' + 115' Assistive device utilized: Lobbyist Level of assistance: SBA and CGA Comments: Outdoor over sidewalk and grass for initial 3 bouts w/ single seated rest break + 2 standing rest breaks due to dyspnea, last bout indoors over level gym floor (SBA); When ambulating over grass pt has LOB forward x1 due  to toe catch requiring hand-over-hand support on the left side to recover.  She has similar LOB when descending sidewalk incline when distracted.  PATIENT EDUCATION: Education details:  Continue HEP.  Person educated: Patient Education method: Explanation Education comprehension: verbalized understanding     HOME EXERCISE PROGRAM: Access Code: [email or web address] URL: [email or web address] Date: 05/12/2021 Prepared by: Mickie Bail Plaster  Exercises - Seated Gastroc Stretch with Strap  - 1 x daily - 5 x weekly - 2 sets - 2 reps - 30 seconds hold - Heel Raises with Counter Support  - 1 x daily - 4 x weekly - 12 reps - Seated Hamstring Curls with Resistance  - 1 x daily - 5 x weekly - 2 sets - 10 reps - Standing Single Leg Stance with Counter Support  - 1 x daily - 5 x weekly - 1 sets - 2 reps - 30 seconds hold - Standing March with Counter Support  - 1 x daily - 5 x weekly - 2 sets - 20 reps - Supine Quadriceps Stretch with Strap on Table  - 1 x daily - 7 x weekly - 3 sets - 10 reps - Modified Thomas Stretch  - 1 x daily - 7 x weekly - 1 sets - 3 reps - 30 secs hold   OLD GOALS: Goals reviewed with patient? Yes   SHORT TERM GOALS: Target date: 05/17/2021   Pt will be IND with initial HEP in order to indicate improved functional mobility and dec fall risk. Baseline: Goal status: MET    2.  Pt will improve gait speed to >/=2.0 ft/sec w/ quad tip cane (and AFO if appropriate) in order to indicate dec fall risk.   Baseline: 1.60 ft/sec with quad tip cane; 2.01 ft/s w/quad tip cane and no AFO Goal status: MET   3.  Will assess DGI and improve score by 3 points in order to indicate dec fall risk.  Baseline: 12/24 Goal status: not met    4.  Pt will negotiate up/down 4 steps with single rail/cane, up/down ramp and curb and ambulate x 500' outdoors over unlevel paved surfaces at S level with quad tip cane in order to indicate improved community mobility.    Baseline: See above  (05/12/21) Goal status: MET   5.  Will assess gait with use of foot up brace/AFO to determine if this will dec fall risk and improve gait efficiency.   Baseline: Assessed w/ L Ottobock reaction AFO-may not be best option Goal status: MET       LONG TERM GOALS: Target date: 06/14/2021   Pt will be IND with final HEP in order to indicate improved functional mobility and dec fall risk. Baseline:  Goal status: IN PROGRESS   2.  Pt will improve gait speed to >/=2.62 ft/sec w/LRAD in order to indicate dec fall risk.   Baseline: 2.01 ft/s w/quad tip cane and no AFO  at prior assessment; 2.03 ft/sec w/ quad tip cane and no AFO 06/09/2021 Goal status: NOT MET   3.  Pt will negotiate up/down 8 steps with cane, up/down ramp and curb and ambulate x 500' outdoors over unlevel paved and grassy surfaces w/ LRAD at mod I level in order to indicate improved community mobility.    Baseline: 8 steps SBA-CGA w/ cane; up/down ramp SBA-CGA, CGA curb, 500' w/ 3 standing rest breaks over sidewalk and grass SBA-MinA Goal status: NOT MET   4.  Pt will improve DGI by 6 points from baseline in order to indicate dec fall risk.  Baseline: 12/24 Goal status: IN PROGRESS   5.  Pt will ambulate x 100' w/ out AD over indoor surfaces in order to indicate improved household independence.  Baseline: 115' SBA w/o AD in gym level surfaces Goal status: MET   UPDATED GOALS: SHORT TERM GOALS:   Target date: 07/14/2021  Walking program will be initiated and pt will report compliance to maintain aerobic and gait training gains. Baseline: To be established. Goal status: INITIAL  2.  2.  Pt will improve gait speed to >/=2.3 ft/sec w/LRAD in order to indicate dec fall risk. Baseline: 2.03 ft/sec w/ quad tip cane and no AFO 06/09/2021 Goal status: REVISED  3.  Pt will negotiate 8 stairs using quad cane step-to gait mod I in order to promote safe community and home access. Baseline: CGA on descent Goal status: INITIAL  4.   Pt will ambulate up and down ramp and inclined surfaces using LRAD mod I for safe community ambulation. Baseline: SBA-CGA Goal status: INITIAL  5.  Pt will ambulate up and down curb step using LRAD mod I for safe access to community and home. Baseline: CGA Goal status: INITIAL  6.  Pt will ambulate >/= 350' without AD on level indoor surfaces to promote unlimited household independence. Baseline: 115' no AD Goal status: REVISED  LONG TERM GOALS:  Target date: 08/11/2021  Pt will be IND with final HEP in order to indicate improved functional mobility and dec fall risk. Baseline:  Established, needs updates. Goal status: ONGOING  2.  Pt will improve gait speed to >/=2.62 ft/sec w/LRAD in order to indicate dec fall risk. Baseline: 2.03 ft/sec w/ quad tip cane and no AFO 06/09/2021 Goal status: ONGOING  3.  Pt will ambulate x 500' continuously outdoors over unlevel paved and grassy surfaces w/ LRAD at mod I level in order to indicate improved community mobility.  Baseline: 250' + 100' + 150' SBA-CGA w/ 1 seated and 2 standing rest breaks Goal status: IN PROGRESS  4.  Pt will improve DGI by 6 points from baseline in order to indicate dec fall risk.  Baseline: 12/24 Goal status: IN PROGRESS  ASSESSMENT:   CLINICAL IMPRESSION:  Pt to be re-certed today following long-term goal assessment.  Remaining DGI goal to be assessed next visit for update baseline.  Pt demonstrates need for CGA when using cane on stairs as pt relies on increased support against railing without grabbing railing.  She is SBA on level indoor surfaces, but requires SBA-CGA when outdoors ambulating over unlevel sidewalk, incline, and grass.  When on incline and grass pt requires intermittent hand-over-hand support on the left side due to LOB.       OBJECTIVE IMPAIRMENTS Abnormal gait, decreased activity tolerance, decreased balance, decreased mobility, decreased ROM, decreased strength, impaired flexibility, impaired UE  functional use, and postural dysfunction.    ACTIVITY LIMITATIONS driving, occupation,  and shopping.    PERSONAL FACTORS Time since onset of injury/illness/exacerbation and 3+ comorbidities: see above  are also affecting patient's functional outcome.      REHAB POTENTIAL: Good   CLINICAL DECISION MAKING: Evolving/moderate complexity   EVALUATION COMPLEXITY: Moderate   PLAN: PT FREQUENCY: 2x/week   PT DURATION: 8 weeks   PLANNED INTERVENTIONS: Therapeutic exercises, Therapeutic activity, Neuromuscular re-education, Balance training, Gait training, Patient/Family education, Vestibular training, Orthotic/Fit training, DME instructions, Aquatic Therapy, and Manual therapy   PLAN FOR NEXT SESSION:   Assess DGI for updated baseline on new goals.  Delete old goals.  Try theraband assist DF, hip/knee flexion with gait and standing balance tasks. She would benefit from dual tasking being added to walking or other low intensity task.  stretching and flexion patterns to reduce tone (sci fit before NMR), continue to work with L ottobock reaction AFO (not sure if this is best option but does help a little), LLE NMR-could continue with high intensity gait training (resisted gait-have help, treadmill with weight on LLE), hip flex stretch/quad stretch.  Gait with cane and without, balance   Donna [name], PT, DPT Outpatient [facility] [address] [place], [place] [address] [phone] 06/09/21, 4:08 PM

## 2021-06-14 ENCOUNTER — Ambulatory Visit: Payer: 59 | Admitting: Rehabilitation

## 2021-06-16 ENCOUNTER — Ambulatory Visit: Payer: 59 | Admitting: Physical Therapy

## 2021-06-21 ENCOUNTER — Ambulatory Visit: Payer: 59 | Attending: Nurse Practitioner | Admitting: Rehabilitation

## 2021-06-21 ENCOUNTER — Encounter: Payer: Self-pay | Admitting: Rehabilitation

## 2021-06-21 DIAGNOSIS — R2681 Unsteadiness on feet: Secondary | ICD-10-CM | POA: Insufficient documentation

## 2021-06-21 DIAGNOSIS — R2689 Other abnormalities of gait and mobility: Secondary | ICD-10-CM | POA: Insufficient documentation

## 2021-06-21 DIAGNOSIS — I69354 Hemiplegia and hemiparesis following cerebral infarction affecting left non-dominant side: Secondary | ICD-10-CM | POA: Insufficient documentation

## 2021-06-21 NOTE — Therapy (Signed)
OUTPATIENT PHYSICAL THERAPY TREATMENT NOTE/PROGRESS NOTE/RE-CERT   Patient Name: Donna [name] MRN: [id number] DOB:[birth year], 71 y.o., female Today's Date: 06/21/2021  PCP: Care, [facility] REFERRING PROVIDER: Delford Field, FNP   END OF SESSION:   PT End of Session - 06/21/21 1407     Visit Number 11    Number of Visits 33    Date for PT Re-Evaluation 08/25/21    Authorization Type UHC Medicare and Medicaid (needs progress note)    Progress Note Due on Visit 20    PT Start Time 1402    PT Stop Time 1445    PT Time Calculation (min) 43 min    Equipment Utilized During Treatment Gait belt    Activity Tolerance Patient tolerated treatment well    Behavior During Therapy WFL for tasks assessed/performed              Past Medical History:  Diagnosis Date   Allergy    Anxiety    Depression    Hyperlipidemia    no meds    Hypertension    Past Surgical History:  Procedure Laterality Date   DENTAL SURGERY     NSVD     x2   TUBAL LIGATION  1976   Patient Active Problem List   Diagnosis Date Noted   Cough    Cigarette nicotine dependence without complication    Stroke ([place]) 05/25/2020   Primary hypertension     REFERRING DIAG: F57.322 (ICD-10-CM) - Hemiplegia and hemiparesis following cerebral infarction affecting left non-dominant side   THERAPY DIAG:  Hemiplegia and hemiparesis following cerebral infarction affecting left non-dominant side (HCC)  Unsteadiness on feet  Other abnormalities of gait and mobility  PERTINENT HISTORY: Anxiety, Depression, Hyperlipidemia, Hypertension  PRECAUTIONS: Fall  SUBJECTIVE: Pt reports she feels she is continuing to improve daily.  PAIN:  Are you having pain? No     TODAY'S TREATMENT:    GAIT: Gait pattern: step to pattern, step through pattern, decreased arm swing- Left, decreased stance time- Left, decreased hip/knee flexion- Left, decreased ankle dorsiflexion- Left, circumduction-  Left, and Left foot flat Distance walked: 250' + 115' + 115' Assistive device utilized: Quad cane small base Level of assistance: SBA and CGA Comments: Continue to trial braces since last several sessions have focused on NMR and LLE strengthening.  PT assist with donning L thuasne PLS brace and ambulated x 250' with quad tip cane.  She is able to perform at SBA level however did note that L toe scuffs for most steps.  Performed NMR tasks before trialing Ottobock reaction AFO.  This seemed to provide better support with very minimal toe catching noted during gait.  Had discussion with pt about getting name of PCP as she sees this MD next week.  If she will bring the name we, can fill out a prescription form for AFO to have them sign and send a note to have MD include rationale for need for AFO in their note to send to [facility].  Pt verbalized understanding.    NMR:  In // bars, standing on foam airex with feet apart EO horizontal head motions x 10 reps, vertical head motion x 10 reps without UE support at S level, alt forward stepping off airex x 10 reps, posterior stepping x 10 reps with cues for improved L hip extension.  Standing L knee flex x 10 reps, forward walking seated on rolling stool x 20' x 2 sets.  Tactile and verbal cues for  technique.     PATIENT EDUCATION: Education details:  see gait section above Person educated: Patient Education method: Explanation Education comprehension: verbalized understanding     HOME EXERCISE PROGRAM: Access Code: [email or web address] URL: [email or web address] Date: 05/12/2021 Prepared by: Mickie Bail Plaster  Exercises - Seated Gastroc Stretch with Strap  - 1 x daily - 5 x weekly - 2 sets - 2 reps - 30 seconds hold - Heel Raises with Counter Support  - 1 x daily - 4 x weekly - 12 reps - Seated Hamstring Curls with Resistance  - 1 x daily - 5 x weekly - 2 sets - 10 reps - Standing Single Leg Stance with Counter Support  - 1 x daily - 5 x weekly - 1  sets - 2 reps - 30 seconds hold - Standing March with Counter Support  - 1 x daily - 5 x weekly - 2 sets - 20 reps - Supine Quadriceps Stretch with Strap on Table  - 1 x daily - 7 x weekly - 3 sets - 10 reps - Modified Thomas Stretch  - 1 x daily - 7 x weekly - 1 sets - 3 reps - 30 secs hold      UPDATED GOALS: SHORT TERM GOALS:   Target date: 07/14/2021  Walking program will be initiated and pt will report compliance to maintain aerobic and gait training gains. Baseline: To be established. Goal status: INITIAL  2.  2.  Pt will improve gait speed to >/=2.3 ft/sec w/LRAD in order to indicate dec fall risk. Baseline: 2.03 ft/sec w/ quad tip cane and no AFO 06/09/2021 Goal status: REVISED  3.  Pt will negotiate 8 stairs using quad cane step-to gait mod I in order to promote safe community and home access. Baseline: CGA on descent Goal status: INITIAL  4.  Pt will ambulate up and down ramp and inclined surfaces using LRAD mod I for safe community ambulation. Baseline: SBA-CGA Goal status: INITIAL  5.  Pt will ambulate up and down curb step using LRAD mod I for safe access to community and home. Baseline: CGA Goal status: INITIAL  6.  Pt will ambulate >/= 350' without AD on level indoor surfaces to promote unlimited household independence. Baseline: 115' no AD Goal status: REVISED  LONG TERM GOALS:  Target date: 08/11/2021  Pt will be IND with final HEP in order to indicate improved functional mobility and dec fall risk. Baseline:  Established, needs updates. Goal status: ONGOING  2.  Pt will improve gait speed to >/=2.62 ft/sec w/LRAD in order to indicate dec fall risk. Baseline: 2.03 ft/sec w/ quad tip cane and no AFO 06/09/2021 Goal status: ONGOING  3.  Pt will ambulate x 500' continuously outdoors over unlevel paved and grassy surfaces w/ LRAD at mod I level in order to indicate improved community mobility.  Baseline: 250' + 100' + 150' SBA-CGA w/ 1 seated and 2 standing rest  breaks Goal status: IN PROGRESS  4.  Pt will improve DGI by 6 points from baseline in order to indicate dec fall risk.  Baseline: 12/24 Goal status: IN PROGRESS  ASSESSMENT:   CLINICAL IMPRESSION: Skilled session focused on gait with AFOs and pt does best with L ottobock reaction AFO so she is going to get the name of her PCP and give to therapist on Friday so we may send prescription for AFO and note to include rationale for AFO in her note.  Also continue to perform NMR tasks to improve L hip extension  and L knee flexion.      OBJECTIVE IMPAIRMENTS Abnormal gait, decreased activity tolerance, decreased balance, decreased mobility, decreased ROM, decreased strength, impaired flexibility, impaired UE functional use, and postural dysfunction.    ACTIVITY LIMITATIONS driving, occupation, and shopping.    PERSONAL FACTORS Time since onset of injury/illness/exacerbation and 3+ comorbidities: see above  are also affecting patient's functional outcome.      REHAB POTENTIAL: Good   CLINICAL DECISION MAKING: Evolving/moderate complexity   EVALUATION COMPLEXITY: Moderate   PLAN: PT FREQUENCY: 2x/week   PT DURATION: 8 weeks   PLANNED INTERVENTIONS: Therapeutic exercises, Therapeutic activity, Neuromuscular re-education, Balance training, Gait training, Patient/Family education, Vestibular training, Orthotic/Fit training, DME instructions, Aquatic Therapy, and Manual therapy   PLAN FOR NEXT SESSION:   If she has PCP name, fill out prescription form and send note with pt to have MD include rationale for AFO in her note. Assess DGI for updated baseline on new goals.   Try theraband assist DF, hip/knee flexion with gait and standing balance tasks. She would benefit from dual tasking being added to walking or other low intensity task.  stretching and flexion patterns to reduce tone (sci fit before NMR), continue to work with L ottobock reaction AFO , LLE NMR-could continue with high intensity gait  training (resisted gait-have help, treadmill with weight on LLE), hip flex stretch/quad stretch.  Gait with cane and without, balance   Cameron Sprang, PT, MPT [facility] [address] [place], [place], [address] Phone: [phone]   Fax:  [phone] 06/21/21, 3:49 PM

## 2021-06-23 ENCOUNTER — Encounter: Payer: Self-pay | Admitting: Physical Therapy

## 2021-06-23 ENCOUNTER — Ambulatory Visit: Payer: 59 | Admitting: Physical Therapy

## 2021-06-23 DIAGNOSIS — R2681 Unsteadiness on feet: Secondary | ICD-10-CM

## 2021-06-23 DIAGNOSIS — I69354 Hemiplegia and hemiparesis following cerebral infarction affecting left non-dominant side: Secondary | ICD-10-CM | POA: Diagnosis not present

## 2021-06-23 DIAGNOSIS — R2689 Other abnormalities of gait and mobility: Secondary | ICD-10-CM

## 2021-06-23 NOTE — Patient Instructions (Signed)
To Lily Peer, FNP,  [name] would benefit from left posterior Ottobock reaction AFO to promote improved left toe clearance during gait and dynamic activity due to residual deficits following her stroke.  In order for [facility] to order this brace your visit note today needs to include justification for this piece of equipment if you are in agreement with this addition to her plan of care.  Please have patient return prescription form with your signature to her physical therapist at her following visit as we will fax this to [facility].  Thank you, Elease Etienne, PT, DPT [facility] [address] [place], [place], [address] Phone: [phone]   Fax:  [phone]

## 2021-06-23 NOTE — Therapy (Signed)
OUTPATIENT PHYSICAL THERAPY TREATMENT NOTE   Patient Name: Donna [name] MRN: [id number] DOB:[birth year], 71 y.o., female Today's Date: 06/23/2021  PCP: Care, [facility] REFERRING PROVIDER: Delford Field, FNP   END OF SESSION:   PT End of Session - 06/23/21 1404     Visit Number 12    Number of Visits 33    Date for PT Re-Evaluation 08/25/21    Authorization Type UHC Medicare and Medicaid (needs progress note)    Progress Note Due on Visit 20    PT Start Time 1400    PT Stop Time 1445    PT Time Calculation (min) 45 min    Equipment Utilized During Treatment Gait belt    Activity Tolerance Patient tolerated treatment well    Behavior During Therapy WFL for tasks assessed/performed              Past Medical History:  Diagnosis Date   Allergy    Anxiety    Depression    Hyperlipidemia    no meds    Hypertension    Past Surgical History:  Procedure Laterality Date   DENTAL SURGERY     NSVD     x2   TUBAL LIGATION  1976   Patient Active Problem List   Diagnosis Date Noted   Cough    Cigarette nicotine dependence without complication    Stroke ([place]) 05/25/2020   Primary hypertension     REFERRING DIAG: Z66.063 (ICD-10-CM) - Hemiplegia and hemiparesis following cerebral infarction affecting left non-dominant side   THERAPY DIAG:  Hemiplegia and hemiparesis following cerebral infarction affecting left non-dominant side (HCC)  Unsteadiness on feet  Other abnormalities of gait and mobility  PERTINENT HISTORY: Anxiety, Depression, Hyperlipidemia, Hypertension  PRECAUTIONS: Fall  SUBJECTIVE: No falls.  She is using tripod cane all the time except when she goes for a long walk when she uses her rollator.  Pt has appt with PCP Donna [name] on Wednesday.    PAIN:  Are you having pain? No  TODAY'S TREATMENT:  Time spent discussing patient's change in primary care with establishment that Donna [name] is her new PCP who she is  to see Wednesday.  Further time spent discussing prescription form for AFO and typing note (located in pt instructions and printed for patient) to provide to FNP.  Pt verbalized understanding and was provided prescription form and note for upcoming visit.  Assessed DGI:  [facility] PT Assessment - 06/23/21 1429       Dynamic Gait Index   Level Surface Mild Impairment    Change in Gait Speed Moderate Impairment    Gait with Horizontal Head Turns Mild Impairment    Gait with Vertical Head Turns Normal    Gait and Pivot Turn Mild Impairment    Step Over Obstacle Moderate Impairment    Step Around Obstacles Normal    Steps Moderate Impairment    Total Score 15    DGI comment: High fall risk            GAIT: Gait pattern: decreased hip/knee flexion- Left, decreased ankle dorsiflexion- Left, Left foot flat, and poor foot clearance- Left Distance walked: 230' Assistive device utilized:  Tripod cane Level of assistance: SBA and CGA Comments: PT assists in donning yellow theraband from plantar aspect of forefoot to hip to promote sagittal plane motion at hip, knee, and ankle.  Pt would do well with inc band color as this promoted improved flexion motion at the ankle and hip.  PATIENT EDUCATION: Education details:  Discussed upcoming MD visit and note provided. Person educated: Patient Education method: Explanation Education comprehension: verbalized understanding     HOME EXERCISE PROGRAM: Access Code: [email or web address] URL: [email or web address] Date: 05/12/2021 Prepared by: Mickie Bail Plaster  Exercises - Seated Gastroc Stretch with Strap  - 1 x daily - 5 x weekly - 2 sets - 2 reps - 30 seconds hold - Heel Raises with Counter Support  - 1 x daily - 4 x weekly - 12 reps - Seated Hamstring Curls with Resistance  - 1 x daily - 5 x weekly - 2 sets - 10 reps - Standing Single Leg Stance with Counter Support  - 1 x daily - 5 x weekly - 1 sets - 2 reps - 30 seconds hold - Standing  March with Counter Support  - 1 x daily - 5 x weekly - 2 sets - 20 reps - Supine Quadriceps Stretch with Strap on Table  - 1 x daily - 7 x weekly - 3 sets - 10 reps - Modified Thomas Stretch  - 1 x daily - 7 x weekly - 1 sets - 3 reps - 30 secs hold      UPDATED GOALS: SHORT TERM GOALS:   Target date: 07/14/2021  Walking program will be initiated and pt will report compliance to maintain aerobic and gait training gains. Baseline: To be established. Goal status: INITIAL  2.  2.  Pt will improve gait speed to >/=2.3 ft/sec w/LRAD in order to indicate dec fall risk. Baseline: 2.03 ft/sec w/ quad tip cane and no AFO 06/09/2021 Goal status: REVISED  3.  Pt will negotiate 8 stairs using quad cane step-to gait mod I in order to promote safe community and home access. Baseline: CGA on descent Goal status: INITIAL  4.  Pt will ambulate up and down ramp and inclined surfaces using LRAD mod I for safe community ambulation. Baseline: SBA-CGA Goal status: INITIAL  5.  Pt will ambulate up and down curb step using LRAD mod I for safe access to community and home. Baseline: CGA Goal status: INITIAL  6.  Pt will ambulate >/= 350' without AD on level indoor surfaces to promote unlimited household independence. Baseline: 115' no AD Goal status: REVISED  LONG TERM GOALS:  Target date: 08/11/2021  Pt will be IND with final HEP in order to indicate improved functional mobility and dec fall risk. Baseline:  Established, needs updates. Goal status: ONGOING  2.  Pt will improve gait speed to >/=2.62 ft/sec w/LRAD in order to indicate dec fall risk. Baseline: 2.03 ft/sec w/ quad tip cane and no AFO 06/09/2021 Goal status: ONGOING  3.  Pt will ambulate x 500' continuously outdoors over unlevel paved and grassy surfaces w/ LRAD at mod I level in order to indicate improved community mobility.  Baseline: 250' + 100' + 150' SBA-CGA w/ 1 seated and 2 standing rest breaks Goal status: IN PROGRESS  4.  Pt  will improve DGI by 6 points from baseline in order to indicate dec fall risk.  Baseline: 12/24; 15/24 06/23/2021 Goal status: IN PROGRESS  ASSESSMENT:   CLINICAL IMPRESSION: Focus of skilled session today on addressing PCP visit with note and prescription provided to patient for posterior ottobock reaction AFO for LLE.  Continued brief gait training with sagittal plane assist of theraband with good tolerance and improved mechanics.  Assessed DGI with improved score of 15/24.  Will continue to progress towards LTGs.       OBJECTIVE  IMPAIRMENTS Abnormal gait, decreased activity tolerance, decreased balance, decreased mobility, decreased ROM, decreased strength, impaired flexibility, impaired UE functional use, and postural dysfunction.    ACTIVITY LIMITATIONS driving, occupation, and shopping.    PERSONAL FACTORS Time since onset of injury/illness/exacerbation and 3+ comorbidities: see above  are also affecting patient's functional outcome.      REHAB POTENTIAL: Good   CLINICAL DECISION MAKING: Evolving/moderate complexity   EVALUATION COMPLEXITY: Moderate   PLAN: PT FREQUENCY: 2x/week   PT DURATION: 8 weeks   PLANNED INTERVENTIONS: Therapeutic exercises, Therapeutic activity, Neuromuscular re-education, Balance training, Gait training, Patient/Family education, Vestibular training, Orthotic/Fit training, DME instructions, Aquatic Therapy, and Manual therapy   PLAN FOR NEXT SESSION:   Progress theraband assist DF, hip/knee flexion with gait and standing balance tasks. She would benefit from dual tasking being added to walking or other low intensity task.  stretching and flexion patterns to reduce tone (sci fit before NMR), continue to work with L ottobock reaction AFO , LLE NMR-could continue with high intensity gait training (resisted gait-have help, treadmill with weight on LLE), hip flex stretch/quad stretch.  Gait with cane and without, balance   Elease Etienne, PT, DPT [facility] [address] [place], [place], [address] Phone: [phone]   Fax:  [phone] 06/23/21, 4:48 PM

## 2021-06-28 ENCOUNTER — Encounter: Payer: Self-pay | Admitting: Physical Therapy

## 2021-06-28 ENCOUNTER — Ambulatory Visit: Payer: 59 | Admitting: Physical Therapy

## 2021-06-28 DIAGNOSIS — R2681 Unsteadiness on feet: Secondary | ICD-10-CM

## 2021-06-28 DIAGNOSIS — R2689 Other abnormalities of gait and mobility: Secondary | ICD-10-CM

## 2021-06-28 DIAGNOSIS — I69354 Hemiplegia and hemiparesis following cerebral infarction affecting left non-dominant side: Secondary | ICD-10-CM | POA: Diagnosis not present

## 2021-06-28 NOTE — Therapy (Signed)
OUTPATIENT PHYSICAL THERAPY TREATMENT NOTE  Patient Name: Donna [name] MRN: [id number] DOB:1950/07/11, 71 y.o., female Today's Date: 06/28/2021  PCP: Care, Jinny Bloss[facility] REFERRING PROVIDER: Delford Field, FNP  END OF SESSION:   PT End of Session - 06/28/21 1148     Visit Number 13    Number of Visits 33    Date for PT Re-Evaluation 08/25/21    Authorization Type UHC Medicare and Medicaid (needs progress note)    Progress Note Due on Visit 20    PT Start Time 1145    PT Stop Time 1230    PT Time Calculation (min) 45 min    Equipment Utilized During Treatment Gait belt    Activity Tolerance Patient tolerated treatment well    Behavior During Therapy WFL for tasks assessed/performed              Past Medical History:  Diagnosis Date   Allergy    Anxiety    Depression    Hyperlipidemia    no meds    Hypertension    Past Surgical History:  Procedure Laterality Date   DENTAL SURGERY     NSVD     x2   TUBAL LIGATION  1976   Patient Active Problem List   Diagnosis Date Noted   Cough    Cigarette nicotine dependence without complication    Stroke ([place]) 05/25/2020   Primary hypertension     REFERRING DIAG: Q94.765 (ICD-10-CM) - Hemiplegia and hemiparesis following cerebral infarction affecting left non-dominant side   THERAPY DIAG:  Hemiplegia and hemiparesis following cerebral infarction affecting left non-dominant side (HCC)  Unsteadiness on feet  Other abnormalities of gait and mobility  PERTINENT HISTORY: Anxiety, Depression, Hyperlipidemia, Hypertension  PRECAUTIONS: Fall  SUBJECTIVE: No falls at home recently.  She has been working on stretching with aide at the edge of her bed.  She walked to the mailbox without the cane this morning and took her time without resultant issues.   PAIN:  Are you having pain? No  TODAY'S TREATMENT:  THEREX: SciFit using BUE/BLE L2.0-2.5 x8 mins for reciprocal movement, BLE strengthening, and  promotion of decreased LE extension synergy.  Min cuing for pursed lip breathing to promote continuous activity in later half of exercise. 639 steps total.  Prolonged rest following.  NMR: -With theraband donned to promote flexion of LLE pt performs squats w/ progressive depth 2x12 using mirror feedback at EOM > LLE 2" step taps using theraband to promote sagittal plane motion performed to fatigue x2, cued to hip flexor engagement upon retreat from step.  GAIT: Gait pattern: decreased hip/knee flexion- Left, decreased ankle dorsiflexion- Left, Left foot flat, and poor foot clearance- Left Distance walked: 345' Assistive device utilized:  Tripod cane & none Level of assistance: SBA and CGA Comments: PT assists in donning blue (green equivalent) theraband from plantar aspect of forefoot to hip to promote sagittal plane motion at hip, knee, and ankle.  This promoted better foot clearance than yellow theraband from session prior.  Pt had mildly increased hamstring engagement during limb advancement when cued during initial bout of gait with tripod cane.  Second bout of gait completed without cane with some noted increase in fatigue.  She ambulates with widened BOS during gait without AD.  She demonstrates improved hip flexor engagement without cane with min cuing, as hip flexor engagement improved so did knee flexion.  PATIENT EDUCATION: Education details:  Confirmed pt has both notes for MD visit today as provided  last session. Person educated: Patient Education method: Explanation Education comprehension: verbalized understanding     HOME EXERCISE PROGRAM: Access Code: [email or web address] URL: [email or web address] Date: 05/12/2021 Prepared by: Mickie Bail Plaster  Exercises - Seated Gastroc Stretch with Strap  - 1 x daily - 5 x weekly - 2 sets - 2 reps - 30 seconds hold - Heel Raises with Counter Support  - 1 x daily - 4 x weekly - 12 reps - Seated Hamstring Curls with Resistance  - 1 x  daily - 5 x weekly - 2 sets - 10 reps - Standing Single Leg Stance with Counter Support  - 1 x daily - 5 x weekly - 1 sets - 2 reps - 30 seconds hold - Standing March with Counter Support  - 1 x daily - 5 x weekly - 2 sets - 20 reps - Supine Quadriceps Stretch with Strap on Table  - 1 x daily - 7 x weekly - 3 sets - 10 reps - Modified Thomas Stretch  - 1 x daily - 7 x weekly - 1 sets - 3 reps - 30 secs hold      UPDATED GOALS: SHORT TERM GOALS:   Target date: 07/14/2021  Walking program will be initiated and pt will report compliance to maintain aerobic and gait training gains. Baseline: To be established. Goal status: INITIAL  2.  2.  Pt will improve gait speed to >/=2.3 ft/sec w/LRAD in order to indicate dec fall risk. Baseline: 2.03 ft/sec w/ quad tip cane and no AFO 06/09/2021 Goal status: REVISED  3.  Pt will negotiate 8 stairs using quad cane step-to gait mod I in order to promote safe community and home access. Baseline: CGA on descent Goal status: INITIAL  4.  Pt will ambulate up and down ramp and inclined surfaces using LRAD mod I for safe community ambulation. Baseline: SBA-CGA Goal status: INITIAL  5.  Pt will ambulate up and down curb step using LRAD mod I for safe access to community and home. Baseline: CGA Goal status: INITIAL  6.  Pt will ambulate >/= 350' without AD on level indoor surfaces to promote unlimited household independence. Baseline: 115' no AD Goal status: REVISED  LONG TERM GOALS:  Target date: 08/11/2021  Pt will be IND with final HEP in order to indicate improved functional mobility and dec fall risk. Baseline:  Established, needs updates. Goal status: ONGOING  2.  Pt will improve gait speed to >/=2.62 ft/sec w/LRAD in order to indicate dec fall risk. Baseline: 2.03 ft/sec w/ quad tip cane and no AFO 06/09/2021 Goal status: ONGOING  3.  Pt will ambulate x 500' continuously outdoors over unlevel paved and grassy surfaces w/ LRAD at mod I level in  order to indicate improved community mobility.  Baseline: 250' + 100' + 150' SBA-CGA w/ 1 seated and 2 standing rest breaks Goal status: IN PROGRESS  4.  Pt will improve DGI by 6 points from baseline in order to indicate dec fall risk.  Baseline: 12/24; 15/24 06/23/2021 Goal status: IN PROGRESS  ASSESSMENT:   CLINICAL IMPRESSION: In session today pt demonstrates improved sagittal plane motion of the LLE during open and closed chain activity using green equivalent theraband for assist.  Pt had improved hip flexor engagement throughout gait and standing balance task of step taps.  She remains limited by decreased activity tolerance, left LE extensor tone, and general LE weakness which continue to be addressed per POC.     OBJECTIVE IMPAIRMENTS Abnormal gait, decreased  activity tolerance, decreased balance, decreased mobility, decreased ROM, decreased strength, impaired flexibility, impaired UE functional use, and postural dysfunction.    ACTIVITY LIMITATIONS driving, occupation, and shopping.    PERSONAL FACTORS Time since onset of injury/illness/exacerbation and 3+ comorbidities: see above  are also affecting patient's functional outcome.      REHAB POTENTIAL: Good   CLINICAL DECISION MAKING: Evolving/moderate complexity   EVALUATION COMPLEXITY: Moderate   PLAN: PT FREQUENCY: 2x/week   PT DURATION: 8 weeks   PLANNED INTERVENTIONS: Therapeutic exercises, Therapeutic activity, Neuromuscular re-education, Balance training, Gait training, Patient/Family education, Vestibular training, Orthotic/Fit training, DME instructions, Aquatic Therapy, and Manual therapy   PLAN FOR NEXT SESSION:   Theraband assist DF/hip/knee flexion with gait and standing balance tasks. She would benefit from dual tasking being added to walking or other low intensity task.  stretching and flexion patterns to reduce tone (sci fit before NMR), continue to work with L ottobock reaction AFO , LLE NMR-could continue with  high intensity gait training (resisted gait-have help, treadmill with weight on LLE), hip flex stretch/quad stretch.  Gait with cane and without, balance-foam beam, side stepping w/ band, glut strength, quad control on LLE  Elease Etienne, PT, DPT [facility] [address] [place], [place], [address] Phone: [phone]   Fax:  [phone] 06/28/21, 1:54 PM

## 2021-06-30 ENCOUNTER — Encounter: Payer: Self-pay | Admitting: Physical Therapy

## 2021-06-30 ENCOUNTER — Ambulatory Visit: Payer: 59 | Admitting: Physical Therapy

## 2021-06-30 VITALS — BP 134/65 | HR 80

## 2021-06-30 DIAGNOSIS — I69354 Hemiplegia and hemiparesis following cerebral infarction affecting left non-dominant side: Secondary | ICD-10-CM

## 2021-06-30 DIAGNOSIS — R2681 Unsteadiness on feet: Secondary | ICD-10-CM

## 2021-06-30 DIAGNOSIS — R2689 Other abnormalities of gait and mobility: Secondary | ICD-10-CM

## 2021-06-30 NOTE — Therapy (Signed)
OUTPATIENT PHYSICAL THERAPY TREATMENT NOTE  Patient Name: Donna [name] MRN: [id number] DOB:1950/10/09, 71 y.o., female Today's Date: 06/30/2021  PCP: Care, Jinny Bloss[facility] REFERRING PROVIDER: Delford Field, FNP  END OF SESSION:   PT End of Session - 06/30/21 1409     Visit Number 14    Number of Visits 33    Date for PT Re-Evaluation 08/25/21    Authorization Type UHC Medicare and Medicaid (needs progress note)    Progress Note Due on Visit 20    PT Start Time 1405    PT Stop Time 1449    PT Time Calculation (min) 44 min    Equipment Utilized During Treatment Gait belt    Activity Tolerance Patient tolerated treatment well    Behavior During Therapy WFL for tasks assessed/performed              Past Medical History:  Diagnosis Date   Allergy    Anxiety    Depression    Hyperlipidemia    no meds    Hypertension    Past Surgical History:  Procedure Laterality Date   DENTAL SURGERY     NSVD     x2   TUBAL LIGATION  1976   Patient Active Problem List   Diagnosis Date Noted   Cough    Cigarette nicotine dependence without complication    Stroke ([place]) 05/25/2020   Primary hypertension     REFERRING DIAG: E28.003 (ICD-10-CM) - Hemiplegia and hemiparesis following cerebral infarction affecting left non-dominant side   THERAPY DIAG:  Hemiplegia and hemiparesis following cerebral infarction affecting left non-dominant side (HCC)  Unsteadiness on feet  Other abnormalities of gait and mobility  PERTINENT HISTORY: Anxiety, Depression, Hyperlipidemia, Hypertension  PRECAUTIONS: Fall  SUBJECTIVE: No falls or other issues at home recently.  PAIN:  Are you having pain? No  VITALS:  (RUE in sitting) Today's Vitals   06/30/21 1412  BP: 134/65  Pulse: 80   TODAY'S TREATMENT:  Onset of session resolving scheduling conflict for next week.  Appt canceled.  THEREX: SciFit using BUE/BLE in hill mode level 3.0x8 mins for reciprocal  movement, BLE strengthening, and dynamic cool-down.  679 steps total. -In // bars:  heel taps from 4" step x20 each LE using RUE support  NMR: -In // bars: step ups to 8" step from airex using alt LE and progressed UE support from BUE to none; lateral stepping on foam beam with intermittent BUE support to prevent posterior LOB -STS w/ LE at 90-90 on airex x10 progressed to no UE support  Rest provided throughout following each exercise due to pt dyspnea.  PATIENT EDUCATION: Education details:  Pt returned note today signed from MD, will fax to [employer].  Continue HEP. Person educated: Patient Education method: Explanation Education comprehension: verbalized understanding     HOME EXERCISE PROGRAM: Access Code: [email or web address] URL: [email or web address] Date: 05/12/2021 Prepared by: Mickie Bail Plaster  Exercises - Seated Gastroc Stretch with Strap  - 1 x daily - 5 x weekly - 2 sets - 2 reps - 30 seconds hold - Heel Raises with Counter Support  - 1 x daily - 4 x weekly - 12 reps - Seated Hamstring Curls with Resistance  - 1 x daily - 5 x weekly - 2 sets - 10 reps - Standing Single Leg Stance with Counter Support  - 1 x daily - 5 x weekly - 1 sets - 2 reps - 30 seconds hold - Standing March with Counter Support  -  1 x daily - 5 x weekly - 2 sets - 20 reps - Supine Quadriceps Stretch with Strap on Table  - 1 x daily - 7 x weekly - 3 sets - 10 reps - Modified Thomas Stretch  - 1 x daily - 7 x weekly - 1 sets - 3 reps - 30 secs hold      UPDATED GOALS: SHORT TERM GOALS:   Target date: 07/14/2021  Walking program will be initiated and pt will report compliance to maintain aerobic and gait training gains. Baseline: To be established. Goal status: INITIAL  2.  2.  Pt will improve gait speed to >/=2.3 ft/sec w/LRAD in order to indicate dec fall risk. Baseline: 2.03 ft/sec w/ quad tip cane and no AFO 06/09/2021 Goal status: REVISED  3.  Pt will negotiate 8 stairs using quad cane  step-to gait mod I in order to promote safe community and home access. Baseline: CGA on descent Goal status: INITIAL  4.  Pt will ambulate up and down ramp and inclined surfaces using LRAD mod I for safe community ambulation. Baseline: SBA-CGA Goal status: INITIAL  5.  Pt will ambulate up and down curb step using LRAD mod I for safe access to community and home. Baseline: CGA Goal status: INITIAL  6.  Pt will ambulate >/= 350' without AD on level indoor surfaces to promote unlimited household independence. Baseline: 115' no AD Goal status: REVISED  LONG TERM GOALS:  Target date: 08/11/2021  Pt will be IND with final HEP in order to indicate improved functional mobility and dec fall risk. Baseline:  Established, needs updates. Goal status: ONGOING  2.  Pt will improve gait speed to >/=2.62 ft/sec w/LRAD in order to indicate dec fall risk. Baseline: 2.03 ft/sec w/ quad tip cane and no AFO 06/09/2021 Goal status: ONGOING  3.  Pt will ambulate x 500' continuously outdoors over unlevel paved and grassy surfaces w/ LRAD at mod I level in order to indicate improved community mobility.  Baseline: 250' + 100' + 150' SBA-CGA w/ 1 seated and 2 standing rest breaks Goal status: IN PROGRESS  4.  Pt will improve DGI by 6 points from baseline in order to indicate dec fall risk.  Baseline: 12/24; 15/24 06/23/2021 Goal status: IN PROGRESS  ASSESSMENT:   CLINICAL IMPRESSION: Onset of session spent discussing future visit to [place] and resolving scheduling conflict.  Remainder of session focused on challenging dynamic balance and immediate standing balance using compliant surfaces.  Pt seemed to have mildly improved tone during session today w/ improved performance of step ups and general unlocking of the left knee during stance.  Will continue to assess and address deficits per POC in upcoming appts.     OBJECTIVE IMPAIRMENTS Abnormal gait, decreased activity tolerance, decreased balance, decreased  mobility, decreased ROM, decreased strength, impaired flexibility, impaired UE functional use, and postural dysfunction.    ACTIVITY LIMITATIONS driving, occupation, and shopping.    PERSONAL FACTORS Time since onset of injury/illness/exacerbation and 3+ comorbidities: see above  are also affecting patient's functional outcome.      REHAB POTENTIAL: Good   CLINICAL DECISION MAKING: Evolving/moderate complexity   EVALUATION COMPLEXITY: Moderate   PLAN: PT FREQUENCY: 2x/week   PT DURATION: 8 weeks   PLANNED INTERVENTIONS: Therapeutic exercises, Therapeutic activity, Neuromuscular re-education, Balance training, Gait training, Patient/Family education, Vestibular training, Orthotic/Fit training, DME instructions, Aquatic Therapy, and Manual therapy   PLAN FOR NEXT SESSION:   Theraband assist DF/hip/knee flexion with gait and standing balance  tasks. She would benefit from dual tasking being added to walking or other low intensity task.  stretching and flexion patterns to reduce tone (sci fit before NMR), continue to work with L ottobock reaction AFO , LLE NMR-could continue with high intensity gait training (resisted gait-have help, treadmill with weight on LLE), hip flex stretch/quad stretch.  Gait with cane and without, balance-foam beam, side stepping w/ band, glut strength, quad control on LLE  Elease Etienne, PT, DPT [facility] [address] [place], [place], [address] Phone: [phone]   Fax:  [phone] 06/30/21, 2:53 PM

## 2021-07-04 ENCOUNTER — Ambulatory Visit: Payer: 59 | Admitting: Rehabilitation

## 2021-07-06 ENCOUNTER — Encounter: Payer: Self-pay | Admitting: Physical Therapy

## 2021-07-06 ENCOUNTER — Ambulatory Visit: Payer: 59 | Admitting: Physical Therapy

## 2021-07-06 VITALS — BP 167/75 | HR 74

## 2021-07-06 DIAGNOSIS — I69354 Hemiplegia and hemiparesis following cerebral infarction affecting left non-dominant side: Secondary | ICD-10-CM

## 2021-07-06 DIAGNOSIS — R2689 Other abnormalities of gait and mobility: Secondary | ICD-10-CM

## 2021-07-06 DIAGNOSIS — R2681 Unsteadiness on feet: Secondary | ICD-10-CM

## 2021-07-06 NOTE — Therapy (Signed)
OUTPATIENT PHYSICAL THERAPY TREATMENT NOTE  Patient Name: Donna [name] MRN: [id number] DOB:Oct 02, 1950, 71 y.o., female Today's Date: 07/06/2021  PCP: Care, Jinny Blossom [facility] REFERRING PROVIDER: Delford Field, FNP  END OF SESSION:   PT End of Session - 07/06/21 1414     Visit Number 15    Number of Visits 33    Date for PT Re-Evaluation 08/25/21    Authorization Type UHC Medicare and Medicaid (needs progress note)    Progress Note Due on Visit 20    PT Start Time 1406    PT Stop Time 1445    PT Time Calculation (min) 39 min    Equipment Utilized During Treatment Gait belt    Activity Tolerance Patient tolerated treatment well    Behavior During Therapy WFL for tasks assessed/performed              Past Medical History:  Diagnosis Date   Allergy    Anxiety    Depression    Hyperlipidemia    no meds    Hypertension    Past Surgical History:  Procedure Laterality Date   DENTAL SURGERY     NSVD     x2   TUBAL LIGATION  1976   Patient Active Problem List   Diagnosis Date Noted   Cough    Cigarette nicotine dependence without complication    Stroke ([place]) 05/25/2020   Primary hypertension     REFERRING DIAG: B09.628 (ICD-10-CM) - Hemiplegia and hemiparesis following cerebral infarction affecting left non-dominant side   THERAPY DIAG:  Hemiplegia and hemiparesis following cerebral infarction affecting left non-dominant side (HCC)  Unsteadiness on feet  Other abnormalities of gait and mobility  PERTINENT HISTORY: Anxiety, Depression, Hyperlipidemia, Hypertension  PRECAUTIONS: Fall  SUBJECTIVE: Her daughter took her to the steps of the backside of the [facility].  She went up and down with a couple standing rests, but felt it improved her walking.  Pt states her MD changed her Amlodipine to 2 50 mg tablets daily.  PAIN:  Are you having pain? Yes: NPRS scale: 6/10 Pain location: lateral left breast Pain description:  sore Aggravating factors: moving the left arm Relieving factors: muscle relaxers No chest or left arm or jaw pain.  She states she is using the left arm more and thinks it has caused this soreness.   VITALS:  (RUE in sitting) Today's Vitals   07/06/21 1419  BP: (!) 167/75  Pulse: 74    TODAY'S TREATMENT:  -Treadmill training x28mn progressing to 0.8 mph w/ 5# ankle wt on LLE for increased LLE clearance and generalized strength. -Midway through treadmill training pt requires prolonged restroom break.  Pt independent with bathroom management. -PT ambulates 996 x2 to bathroom and back to treadmill w/ cue to improved left hip flexion and knee flexion to prevent excessive circumduction.   -Step backwards to dots to promote hip strengthening x20 each LE, pt demos good engagement of trunk in upright posture and hip extension  PATIENT EDUCATION: Education details:  Continue HEP. Person educated: Patient Education method: Explanation Education comprehension: verbalized understanding     HOME EXERCISE PROGRAM: Access Code: [email or web address] URL: [email or web address] Date: 05/12/2021 Prepared by: JMickie BailPlaster  Exercises - Seated Gastroc Stretch with Strap  - 1 x daily - 5 x weekly - 2 sets - 2 reps - 30 seconds hold - Heel Raises with Counter Support  - 1 x daily - 4 x weekly - 12 reps - Seated Hamstring Curls with Resistance  -  1 x daily - 5 x weekly - 2 sets - 10 reps - Standing Single Leg Stance with Counter Support  - 1 x daily - 5 x weekly - 1 sets - 2 reps - 30 seconds hold - Standing March with Counter Support  - 1 x daily - 5 x weekly - 2 sets - 20 reps - Supine Quadriceps Stretch with Strap on Table  - 1 x daily - 7 x weekly - 3 sets - 10 reps - Modified Thomas Stretch  - 1 x daily - 7 x weekly - 1 sets - 3 reps - 30 secs hold    UPDATED GOALS: SHORT TERM GOALS:   Target date: 07/14/2021  Walking program will be initiated and pt will report compliance to maintain  aerobic and gait training gains. Baseline: To be established. Goal status: INITIAL  2.  2.  Pt will improve gait speed to >/=2.3 ft/sec w/LRAD in order to indicate dec fall risk. Baseline: 2.03 ft/sec w/ quad tip cane and no AFO 06/09/2021 Goal status: REVISED  3.  Pt will negotiate 8 stairs using quad cane step-to gait mod I in order to promote safe community and home access. Baseline: CGA on descent Goal status: INITIAL  4.  Pt will ambulate up and down ramp and inclined surfaces using LRAD mod I for safe community ambulation. Baseline: SBA-CGA Goal status: INITIAL  5.  Pt will ambulate up and down curb step using LRAD mod I for safe access to community and home. Baseline: CGA Goal status: INITIAL  6.  Pt will ambulate >/= 350' without AD on level indoor surfaces to promote unlimited household independence. Baseline: 115' no AD Goal status: REVISED  LONG TERM GOALS:  Target date: 08/11/2021  Pt will be IND with final HEP in order to indicate improved functional mobility and dec fall risk. Baseline:  Established, needs updates. Goal status: ONGOING  2.  Pt will improve gait speed to >/=2.62 ft/sec w/LRAD in order to indicate dec fall risk. Baseline: 2.03 ft/sec w/ quad tip cane and no AFO 06/09/2021 Goal status: ONGOING  3.  Pt will ambulate x 500' continuously outdoors over unlevel paved and grassy surfaces w/ LRAD at mod I level in order to indicate improved community mobility.  Baseline: 250' + 100' + 150' SBA-CGA w/ 1 seated and 2 standing rest breaks Goal status: IN PROGRESS  4.  Pt will improve DGI by 6 points from baseline in order to indicate dec fall risk.  Baseline: 12/24; 15/24 06/23/2021 Goal status: IN PROGRESS  ASSESSMENT:   CLINICAL IMPRESSION: Session limited today by prolonged bathroom visit midway through treadmill training, limited activity tolerance, and pt needing mild redirection to task during later half of session.  Pt demonstrating functional progress  with improved muscle engagement during treadmill training and backwards stepping.  Her static balance without UE support continues to progress and she continues to be motivated and challenge herself with supervision outside the clinic.  Will further address extensor tone, flexor strength, and fluidity of functional mobility as able per POC.     OBJECTIVE IMPAIRMENTS Abnormal gait, decreased activity tolerance, decreased balance, decreased mobility, decreased ROM, decreased strength, impaired flexibility, impaired UE functional use, and postural dysfunction.    ACTIVITY LIMITATIONS driving, occupation, and shopping.    PERSONAL FACTORS Time since onset of injury/illness/exacerbation and 3+ comorbidities: see above  are also affecting patient's functional outcome.      REHAB POTENTIAL: Good   CLINICAL DECISION MAKING: Evolving/moderate complexity  EVALUATION COMPLEXITY: Moderate   PLAN: PT FREQUENCY: 2x/week   PT DURATION: 8 weeks   PLANNED INTERVENTIONS: Therapeutic exercises, Therapeutic activity, Neuromuscular re-education, Balance training, Gait training, Patient/Family education, Vestibular training, Orthotic/Fit training, DME instructions, Aquatic Therapy, and Manual therapy   PLAN FOR NEXT SESSION:   Theraband assist DF/hip/knee flexion with gait and standing balance tasks. She would benefit from dual tasking being added to walking or other low intensity task.  stretching and flexion patterns to reduce tone (sci fit before NMR), continue to work with L ottobock reaction AFO , LLE NMR-could continue with high intensity gait training (resisted gait-have help, treadmill with weight on LLE), hip flex stretch/quad stretch.  Gait with cane and without, balance-foam beam, side stepping w/ band, glut strength, quad control on LLE  Elease Etienne, PT, DPT [facility] [address] [place], [place], [address] Phone: [phone]   Fax:   [phone] 07/06/21, 2:51 PM

## 2021-07-11 ENCOUNTER — Ambulatory Visit: Payer: 59 | Admitting: Physical Therapy

## 2021-07-11 ENCOUNTER — Encounter: Payer: Self-pay | Admitting: Physical Therapy

## 2021-07-11 VITALS — BP 160/68

## 2021-07-11 DIAGNOSIS — R2689 Other abnormalities of gait and mobility: Secondary | ICD-10-CM

## 2021-07-11 DIAGNOSIS — I69354 Hemiplegia and hemiparesis following cerebral infarction affecting left non-dominant side: Secondary | ICD-10-CM

## 2021-07-11 DIAGNOSIS — R2681 Unsteadiness on feet: Secondary | ICD-10-CM

## 2021-07-14 ENCOUNTER — Encounter: Payer: Self-pay | Admitting: Physical Therapy

## 2021-07-14 ENCOUNTER — Ambulatory Visit: Payer: 59 | Admitting: Physical Therapy

## 2021-07-14 VITALS — BP 141/69 | HR 82

## 2021-07-14 DIAGNOSIS — I69354 Hemiplegia and hemiparesis following cerebral infarction affecting left non-dominant side: Secondary | ICD-10-CM | POA: Diagnosis not present

## 2021-07-14 DIAGNOSIS — R2689 Other abnormalities of gait and mobility: Secondary | ICD-10-CM

## 2021-07-14 DIAGNOSIS — R2681 Unsteadiness on feet: Secondary | ICD-10-CM

## 2021-07-14 NOTE — Therapy (Signed)
OUTPATIENT PHYSICAL THERAPY TREATMENT NOTE  Patient Name: Donna [name] MRN: [id number] DOB:09-15-1950, 71 y.o., female Today's Date: 07/14/2021  PCP: Care, [facility] REFERRING PROVIDER: Delford Field, FNP  END OF SESSION:   PT End of Session - 07/14/21 1313     Visit Number 17    Number of Visits 33    Date for PT Re-Evaluation 08/25/21    Authorization Type UHC Medicare and Medicaid (needs progress note)    Progress Note Due on Visit 20    PT Start Time 1313    PT Stop Time 1357    PT Time Calculation (min) 44 min    Equipment Utilized During Treatment Gait belt    Activity Tolerance Patient tolerated treatment well    Behavior During Therapy WFL for tasks assessed/performed              Past Medical History:  Diagnosis Date   Allergy    Anxiety    Depression    Hyperlipidemia    no meds    Hypertension    Past Surgical History:  Procedure Laterality Date   DENTAL SURGERY     NSVD     x2   TUBAL LIGATION  1976   Patient Active Problem List   Diagnosis Date Noted   Cough    Cigarette nicotine dependence without complication    Stroke ([place]) 05/25/2020   Primary hypertension     REFERRING DIAG: Y33.383 (ICD-10-CM) - Hemiplegia and hemiparesis following cerebral infarction affecting left non-dominant side   THERAPY DIAG:  Hemiplegia and hemiparesis following cerebral infarction affecting left non-dominant side (HCC)  Unsteadiness on feet  Other abnormalities of gait and mobility  PERTINENT HISTORY: Anxiety, Depression, Hyperlipidemia, Hypertension  PRECAUTIONS: Fall  SUBJECTIVE: No falls recently.  Nothing else new.  PAIN:  Are you having pain? No   VITALS:  (RUE in sitting) Today's Vitals   07/14/21 1315  BP: (!) 141/69  Pulse: 82     TODAY'S TREATMENT:   STG assessment: 10MWT using tripod cane:  13.72 sec = 0.73 m/sec OR 2.41 ft/sec 10MWT w/o AD:  15.40 sec = 0.65 m/sec OR 2.14 ft/sec  STAIRS:  Level of  Assistance: SBA, CGA, and Min A  Stair Negotiation Technique: Step to Pattern with No Rails-use of tripod cane on right  Number of Stairs: 8   Height of Stairs: 6"  Comments: Pt experiences LOB on last 2 steps requiring minA to prevent fall.  Pt demonstrates posterior lean when descending stairs and difficulty clearing LLE when ascending stairs.  She uses proper AD gait pattern throughout, but at this time is safest using an additional rail for UE support.  RLE leading during ascent and descent, edu on up with the good and down with the bad.  RAMP:  Level of Assistance: Modified independence Assistive device utilized:  tripod cane Ramp Comments: x2, one incident of left toe catch w/ pt independent in correction and maintenance of balance.  CURB:  Level of Assistance: Modified independence Assistive device utilized:  tripod cane Curb Comments: x2, pt uses edu from stair performance maintaining good gait pattern and safety with approach.  GAIT: Gait pattern: decreased step length- Left, decreased hip/knee flexion- Left, and decreased ankle dorsiflexion- Left Distance walked: 370' Assistive device utilized: None Level of assistance: SBA Comments: Pt ambulates with decreased speed and stride length, but is overall improving in her voluntary knee flexion (still limited).  She demonstrates 3 mild instances of midfoot scuffing w/o overt  LOB and no visible toe catch today.  PATIENT EDUCATION: Education details:  Continue HEP and formal walking program.  Progress towards goals. Person educated: Patient Education method: Explanation Education comprehension: verbalized understanding     HOME EXERCISE PROGRAM: Access Code: [email or web address] URL: [email or web address] Date: 05/12/2021 Prepared by: Mickie Bail Plaster  Exercises - Seated Gastroc Stretch with Strap  - 1 x daily - 5 x weekly - 2 sets - 2 reps - 30 seconds hold - Heel Raises with Counter Support  - 1 x daily - 4 x weekly - 12  reps - Seated Hamstring Curls with Resistance  - 1 x daily - 5 x weekly - 2 sets - 10 reps - Standing Single Leg Stance with Counter Support  - 1 x daily - 5 x weekly - 1 sets - 2 reps - 30 seconds hold - Standing March with Counter Support  - 1 x daily - 5 x weekly - 2 sets - 20 reps - Supine Quadriceps Stretch with Strap on Table  - 1 x daily - 7 x weekly - 3 sets - 10 reps - Modified Thomas Stretch  - 1 x daily - 7 x weekly - 1 sets - 3 reps - 30 secs hold - Forward Monster Walk with Resistance at [employer] and Counter Support  - 1 x daily - 5 x weekly - 3 sets - 10 reps - Side Stepping with Resistance at [employer] and Counter Support  - 1 x daily - 5 x weekly - 3 sets - 10 reps  Walking program:  Walk everyday 2-3 minutes at a time 5x per day.  Take your cell phone in the event that no one can go with you.  Walk on flat, paved, or indoor surfaces.    UPDATED GOALS: SHORT TERM GOALS:   Target date: 07/14/2021  Walking program will be initiated and pt will report compliance to maintain aerobic and gait training gains. Baseline: Established and pt states compliance. Goal status: MET  2.  2.  Pt will improve gait speed to >/=2.3 ft/sec w/LRAD in order to indicate dec fall risk. Baseline: 2.03 ft/sec w/ quad tip cane and no AFO 06/09/2021; 07/14/2021 2.41 ft/sec w/ tripod cane no AFO Goal status: MET  3.  Pt will negotiate 8 stairs using quad cane step-to gait mod I in order to promote safe community and home access. Baseline: CGA on descent; 07/14/2021 minA due to LOB on descent Goal status: NOT MET  4.  Pt will ambulate up and down ramp and inclined surfaces using LRAD mod I for safe community ambulation. Baseline: SBA-CGA; 07/14/2021 mod I w/ tripod cane Goal status: MET  5.  Pt will ambulate up and down curb step using LRAD mod I for safe access to community and home. Baseline: CGA; 07/14/2021 mod I w/ tripod cane Goal status: MET  6.  Pt will ambulate >/= 350' without AD on level indoor  surfaces to promote unlimited household independence. Baseline: 115' no AD; 07/14/2021 370' no AD Goal status: MET  LONG TERM GOALS:  Target date: 08/11/2021  Pt will be IND with final HEP in order to indicate improved functional mobility and dec fall risk. Baseline:  Established, needs updates. Goal status: ONGOING  2.  Pt will improve gait speed to >/=2.62 ft/sec w/LRAD in order to indicate dec fall risk. Baseline: 2.03 ft/sec w/ quad tip cane and no AFO 06/09/2021 Goal status: ONGOING  3.  Pt will ambulate x 500' continuously outdoors over unlevel paved and  grassy surfaces w/ LRAD at mod I level in order to indicate improved community mobility.  Baseline: 250' + 100' + 150' SBA-CGA w/ 1 seated and 2 standing rest breaks Goal status: IN PROGRESS  4.  Pt will improve DGI by 6 points from baseline in order to indicate dec fall risk.  Baseline: 12/24; 15/24 06/23/2021 Goal status: IN PROGRESS  ASSESSMENT:   CLINICAL IMPRESSION: Short term goals assessed today with pt demonstrating improvement in ambulation tolerance without AD to 370' w/o obvious to catch.  Her voluntary activation of the left lower extremity is showing some improvement in recent sessions and she is reporting less toe curling on the left foot.  Her gait speed is improved both with and without her tripod cane to 2.41 seconds w/ use of the cane.  She uses the tripod cane to ambulate up and down ramped surface and curb step at a modified independent level, but remains limited by need for additional upper extremity support on stairs due to mild posterior lean and difficulty ascending the LLE.  Walking program has been established and pt states compliance.  Will continue to advance HEP and progress pt towards goals with skilled PT intervention.     OBJECTIVE IMPAIRMENTS Abnormal gait, decreased activity tolerance, decreased balance, decreased mobility, decreased ROM, decreased strength, impaired flexibility, impaired UE functional  use, and postural dysfunction.    ACTIVITY LIMITATIONS driving, occupation, and shopping.    PERSONAL FACTORS Time since onset of injury/illness/exacerbation and 3+ comorbidities: see above  are also affecting patient's functional outcome.      REHAB POTENTIAL: Good   CLINICAL DECISION MAKING: Evolving/moderate complexity   EVALUATION COMPLEXITY: Moderate   PLAN: PT FREQUENCY: 2x/week   PT DURATION: 8 weeks   PLANNED INTERVENTIONS: Therapeutic exercises, Therapeutic activity, Neuromuscular re-education, Balance training, Gait training, Patient/Family education, Vestibular training, Orthotic/Fit training, DME instructions, Aquatic Therapy, and Manual therapy   PLAN FOR NEXT SESSION:   Theraband assist DF/hip/knee flexion with gait and standing balance tasks. She would benefit from dual tasking being added to walking or other low intensity task.  stretching and flexion patterns to reduce tone (sci fit before NMR), continue to work with L ottobock reaction AFO , LLE NMR-could continue with high intensity gait training (resisted gait-have help, treadmill with weight on LLE), hip flex stretch/quad stretch.  Gait with cane and without, balance-foam beam, side stepping w/ band, glut strength, quad control on LLE  Elease Etienne, PT, DPT [facility] [address] [place], [place], [address] Phone: [phone]   Fax:  [phone] 07/14/21, 3:06 PM

## 2021-07-19 ENCOUNTER — Ambulatory Visit: Payer: 59 | Attending: Nurse Practitioner | Admitting: Physical Therapy

## 2021-07-19 ENCOUNTER — Encounter: Payer: Self-pay | Admitting: Physical Therapy

## 2021-07-19 VITALS — BP 151/63 | HR 86

## 2021-07-19 DIAGNOSIS — R2681 Unsteadiness on feet: Secondary | ICD-10-CM | POA: Diagnosis present

## 2021-07-19 DIAGNOSIS — R2689 Other abnormalities of gait and mobility: Secondary | ICD-10-CM | POA: Diagnosis present

## 2021-07-19 DIAGNOSIS — I69354 Hemiplegia and hemiparesis following cerebral infarction affecting left non-dominant side: Secondary | ICD-10-CM | POA: Diagnosis not present

## 2021-07-19 NOTE — Therapy (Signed)
OUTPATIENT PHYSICAL THERAPY TREATMENT NOTE  Patient Name: Donna [name] MRN: [id number] DOB:August 21, 1950, 71 y.o., female Today's Date: 07/19/2021  PCP: Care, [facility]inny [facility] REFERRING PROVIDER: Delford Field, FNP  END OF SESSION:   PT End of Session - 07/19/21 1451     Visit Number 18    Number of Visits 33    Date for PT Re-Evaluation 08/25/21    Authorization Type UHC Medicare and Medicaid (needs progress note)    Progress Note Due on Visit 20    PT Start Time 1448    PT Stop Time 1529    PT Time Calculation (min) 41 min    Equipment Utilized During Treatment Gait belt    Activity Tolerance Patient tolerated treatment well    Behavior During Therapy WFL for tasks assessed/performed              Past Medical History:  Diagnosis Date   Allergy    Anxiety    Depression    Hyperlipidemia    no meds    Hypertension    Past Surgical History:  Procedure Laterality Date   DENTAL SURGERY     NSVD     x2   TUBAL LIGATION  1976   Patient Active Problem List   Diagnosis Date Noted   Cough    Cigarette nicotine dependence without complication    Stroke ([place]) 05/25/2020   Primary hypertension     REFERRING DIAG: F42.395 (ICD-10-CM) - Hemiplegia and hemiparesis following cerebral infarction affecting left non-dominant side   THERAPY DIAG:  Hemiplegia and hemiparesis following cerebral infarction affecting left non-dominant side (HCC)  Unsteadiness on feet  Other abnormalities of gait and mobility  PERTINENT HISTORY: Anxiety, Depression, Hyperlipidemia, Hypertension  PRECAUTIONS: Fall  SUBJECTIVE: She has been caring for her daughter's dog again for the 4th of July due to fireworks.  She has been walking more at home and feels that has improved.  Her toes are not cramping as much.  Working on her balance more.  No recent falls.  PAIN:  Are you having pain? No   VITALS:  (RUE in sitting) Today's Vitals   07/19/21 1452  BP: (!) 151/63   Pulse: 86    TODAY'S TREATMENT:   STAIRS:  Level of Assistance: SBA and CGA  Stair Negotiation Technique: Step to Pattern Alternating Pattern  Forwards With use of AD: Tripod cane  with No Rails Single Rail on Left Bilateral Rails  Number of Stairs: 4 + 12 + 8   Height of Stairs: 6"  Comments: First trial of stairs w/ L rail and tripod cane in RUE.  Pt demonstrates step-to with RLE leading when ascending and descending.  Second trial of gait using cane only on R w/ step-to w/ R leading during ascent and L leading on descent w/ intermittent use of RLE to lead during descent.  Final trial of stairs with bilateral rails practicing reciprocal gait w/ improved LLE knee flexion noted and functional strength during ascent.  -Using BUE support on rails performing alt LE heel taps from 6" step x25 each LE continuously w/ cuing for proper knee and ankle alignment. -Prone kickbacks w/ knee bent x15 w/ therapist facilitating maintained knee bend on the LLE due to tone -supine hamstring curls w/ peanut ball x10 > single LLE hamstring curl w/ peanut ball x10 (pt provides AAROM x2 to promote maintained LLE engagement of hamstring and continuity of motion) -Initiated reverse lunges at countertop x10 w/ unilateral countertop support (LLE  only)  PATIENT EDUCATION: Education details:  Continue HEP and formal walking program. Person educated: Patient Education method: Explanation Education comprehension: verbalized understanding     HOME EXERCISE PROGRAM: Access Code: [email or web address] URL: [email or web address] Date: 05/12/2021 Prepared by: Mickie Bail Plaster  Exercises - Seated Gastroc Stretch with Strap  - 1 x daily - 5 x weekly - 2 sets - 2 reps - 30 seconds hold - Heel Raises with Counter Support  - 1 x daily - 4 x weekly - 12 reps - Seated Hamstring Curls with Resistance  - 1 x daily - 5 x weekly - 2 sets - 10 reps - Standing Single Leg Stance with Counter Support  - 1 x daily - 5 x  weekly - 1 sets - 2 reps - 30 seconds hold - Standing March with Counter Support  - 1 x daily - 5 x weekly - 2 sets - 20 reps - Supine Quadriceps Stretch with Strap on Table  - 1 x daily - 7 x weekly - 3 sets - 10 reps - Modified Thomas Stretch  - 1 x daily - 7 x weekly - 1 sets - 3 reps - 30 secs hold - Forward Monster Walk with Resistance at [employer] and Counter Support  - 1 x daily - 5 x weekly - 3 sets - 10 reps - Side Stepping with Resistance at [employer] and Counter Support  - 1 x daily - 5 x weekly - 3 sets - 10 reps  Walking program:  Walk everyday 2-3 minutes at a time 5x per day.  Take your cell phone in the event that no one can go with you.  Walk on flat, paved, or indoor surfaces.    UPDATED GOALS: SHORT TERM GOALS:   Target date: 07/14/2021  Walking program will be initiated and pt will report compliance to maintain aerobic and gait training gains. Baseline: Established and pt states compliance. Goal status: MET  2.  2.  Pt will improve gait speed to >/=2.3 ft/sec w/LRAD in order to indicate dec fall risk. Baseline: 2.03 ft/sec w/ quad tip cane and no AFO 06/09/2021; 07/14/2021 2.41 ft/sec w/ tripod cane no AFO Goal status: MET  3.  Pt will negotiate 8 stairs using quad cane step-to gait mod I in order to promote safe community and home access. Baseline: CGA on descent; 07/14/2021 minA due to LOB on descent Goal status: NOT MET  4.  Pt will ambulate up and down ramp and inclined surfaces using LRAD mod I for safe community ambulation. Baseline: SBA-CGA; 07/14/2021 mod I w/ tripod cane Goal status: MET  5.  Pt will ambulate up and down curb step using LRAD mod I for safe access to community and home. Baseline: CGA; 07/14/2021 mod I w/ tripod cane Goal status: MET  6.  Pt will ambulate >/= 350' without AD on level indoor surfaces to promote unlimited household independence. Baseline: 115' no AD; 07/14/2021 370' no AD Goal status: MET  LONG TERM GOALS:  Target date: 08/11/2021  Pt  will be IND with final HEP in order to indicate improved functional mobility and dec fall risk. Baseline:  Established, needs updates. Goal status: ONGOING  2.  Pt will improve gait speed to >/=2.62 ft/sec w/LRAD in order to indicate dec fall risk. Baseline: 2.03 ft/sec w/ quad tip cane and no AFO 06/09/2021 Goal status: ONGOING  3.  Pt will ambulate x 500' continuously outdoors over unlevel paved and grassy surfaces w/ LRAD at mod I level in order  to indicate improved community mobility.  Baseline: 250' + 100' + 150' SBA-CGA w/ 1 seated and 2 standing rest breaks Goal status: IN PROGRESS  4.  Pt will improve DGI by 6 points from baseline in order to indicate dec fall risk.  Baseline: 12/24; 15/24 06/23/2021 Goal status: IN PROGRESS  ASSESSMENT:   CLINICAL IMPRESSION: Focus of skilled session today on continuing to promote flexion motion of the LLE and emphasis on stair training w/ reciprocal pattern.  Pt still prefers leading during ascending and descending with the RLE when only using the tripod cane due to tendency to shift weight posteriorly causing mild instability.  Pt demonstrates good closed chain knee flexion of the LLE throughout session today.  Will continue to address deficits per POC.     OBJECTIVE IMPAIRMENTS Abnormal gait, decreased activity tolerance, decreased balance, decreased mobility, decreased ROM, decreased strength, impaired flexibility, impaired UE functional use, and postural dysfunction.    ACTIVITY LIMITATIONS driving, occupation, and shopping.    PERSONAL FACTORS Time since onset of injury/illness/exacerbation and 3+ comorbidities: see above  are also affecting patient's functional outcome.      REHAB POTENTIAL: Good   CLINICAL DECISION MAKING: Evolving/moderate complexity   EVALUATION COMPLEXITY: Moderate   PLAN: PT FREQUENCY: 2x/week   PT DURATION: 8 weeks   PLANNED INTERVENTIONS: Therapeutic exercises, Therapeutic activity, Neuromuscular  re-education, Balance training, Gait training, Patient/Family education, Vestibular training, Orthotic/Fit training, DME instructions, Aquatic Therapy, and Manual therapy   PLAN FOR NEXT SESSION:   Continue reverse and forward lunges.  Try stair taps on airex again.  Theraband assist DF/hip/knee flexion with gait and standing balance tasks. She would benefit from dual tasking being added to walking or other low intensity task.  stretching and flexion patterns to reduce tone (sci fit before NMR), continue to work with L ottobock reaction AFO , LLE NMR-could continue with high intensity gait training (resisted gait-have help, treadmill with weight on LLE), hip flex stretch/quad stretch.  Gait with cane and without, glut strength, quad control on LLE  Elease Etienne, PT, DPT [facility] [address] [place], [place], [address] Phone: [phone]   Fax:  [phone] 07/19/21, 3:32 PM

## 2021-07-21 ENCOUNTER — Ambulatory Visit: Payer: 59 | Admitting: Physical Therapy

## 2021-07-25 ENCOUNTER — Ambulatory Visit: Payer: 59 | Admitting: Rehabilitation

## 2021-07-28 ENCOUNTER — Encounter: Payer: Self-pay | Admitting: Physical Therapy

## 2021-07-28 ENCOUNTER — Ambulatory Visit: Payer: 59 | Admitting: Physical Therapy

## 2021-07-28 VITALS — BP 138/60 | HR 74

## 2021-07-28 DIAGNOSIS — R2689 Other abnormalities of gait and mobility: Secondary | ICD-10-CM

## 2021-07-28 DIAGNOSIS — R2681 Unsteadiness on feet: Secondary | ICD-10-CM

## 2021-07-28 DIAGNOSIS — I69354 Hemiplegia and hemiparesis following cerebral infarction affecting left non-dominant side: Secondary | ICD-10-CM

## 2021-07-28 NOTE — Therapy (Signed)
OUTPATIENT PHYSICAL THERAPY TREATMENT NOTE  Patient Name: Donna [name] MRN: [id number] DOB:1950/10/15, 71 y.o., female Today's Date: 07/28/2021  PCP: Care, Jinny Bloss[facility] REFERRING PROVIDER: Delford Field, FNP  END OF SESSION:   PT End of Session - 07/28/21 1405     Visit Number 19    Number of Visits 33    Date for PT Re-Evaluation 08/25/21    Authorization Type UHC Medicare and Medicaid (needs progress note)    Progress Note Due on Visit 20    PT Start Time 1400    PT Stop Time 1441    PT Time Calculation (min) 41 min    Equipment Utilized During Treatment Gait belt    Activity Tolerance Patient tolerated treatment well    Behavior During Therapy WFL for tasks assessed/performed              Past Medical History:  Diagnosis Date   Allergy    Anxiety    Depression    Hyperlipidemia    no meds    Hypertension    Past Surgical History:  Procedure Laterality Date   DENTAL SURGERY     NSVD     x2   TUBAL LIGATION  1976   Patient Active Problem List   Diagnosis Date Noted   Cough    Cigarette nicotine dependence without complication    Stroke ([place]) 05/25/2020   Primary hypertension     REFERRING DIAG: Z12.458 (ICD-10-CM) - Hemiplegia and hemiparesis following cerebral infarction affecting left non-dominant side   THERAPY DIAG:  Hemiplegia and hemiparesis following cerebral infarction affecting left non-dominant side (HCC)  Unsteadiness on feet  Other abnormalities of gait and mobility  PERTINENT HISTORY: Anxiety, Depression, Hyperlipidemia, Hypertension  PRECAUTIONS: Fall  SUBJECTIVE: She is still doing her stretches and walking everyday.  When she is walking she is learning to use her hip more and she doesn't get as tired.  Pt states she has appt with [facility] on 08/11/2021 for AFO fitting.  PAIN:  Are you having pain? No   VITALS:  (RUE in sitting) Today's Vitals   07/28/21 1402  BP: 138/60  Pulse: 74    TODAY'S  TREATMENT:  -forward and reverse lunges continuously x20 each LE each direction w/ cues for amplitude of movement and using DF to engage knee flexion of LLE when reverse lunging. -Lunge holds w/ unilateral UE support on counter 6x10seconds each LE -Lunge w/ cross body reach x6 each LE -airex stair taps bilaterally to 4th step; pt demonstrates improved volitional hip and knee flexion to 90-100 degrees during task -STS from 15" surface (bench seat w/ airex on top) w/ minA to promote forward lean and boost into standing; 2x5  PATIENT EDUCATION: Education details:  Continue HEP and formal walking program. Person educated: Patient Education method: Explanation Education comprehension: verbalized understanding     HOME EXERCISE PROGRAM: Access Code: [facility] URL: [email or web address] Date: 05/12/2021 Prepared by: Mickie Bail Plaster  Exercises - Seated Gastroc Stretch with Strap  - 1 x daily - 5 x weekly - 2 sets - 2 reps - 30 seconds hold - Heel Raises with Counter Support  - 1 x daily - 4 x weekly - 12 reps - Seated Hamstring Curls with Resistance  - 1 x daily - 5 x weekly - 2 sets - 10 reps - Standing Single Leg Stance with Counter Support  - 1 x daily - 5 x weekly - 1 sets - 2 reps - 30 seconds hold - Standing  March with Counter Support  - 1 x daily - 5 x weekly - 2 sets - 20 reps - Supine Quadriceps Stretch with Strap on Table  - 1 x daily - 7 x weekly - 3 sets - 10 reps - Modified Thomas Stretch  - 1 x daily - 7 x weekly - 1 sets - 3 reps - 30 secs hold - Forward Monster Walk with Resistance at [employer] and Counter Support  - 1 x daily - 5 x weekly - 3 sets - 10 reps - Side Stepping with Resistance at [employer] and Counter Support  - 1 x daily - 5 x weekly - 3 sets - 10 reps  Walking program:  Walk everyday 2-3 minutes at a time 5x per day.  Take your cell phone in the event that no one can go with you.  Walk on flat, paved, or indoor surfaces.    UPDATED GOALS: SHORT TERM  GOALS:   Target date: 07/14/2021  Walking program will be initiated and pt will report compliance to maintain aerobic and gait training gains. Baseline: Established and pt states compliance. Goal status: MET  2.  2.  Pt will improve gait speed to >/=2.3 ft/sec w/LRAD in order to indicate dec fall risk. Baseline: 2.03 ft/sec w/ quad tip cane and no AFO 06/09/2021; 07/14/2021 2.41 ft/sec w/ tripod cane no AFO Goal status: MET  3.  Pt will negotiate 8 stairs using quad cane step-to gait mod I in order to promote safe community and home access. Baseline: CGA on descent; 07/14/2021 minA due to LOB on descent Goal status: NOT MET  4.  Pt will ambulate up and down ramp and inclined surfaces using LRAD mod I for safe community ambulation. Baseline: SBA-CGA; 07/14/2021 mod I w/ tripod cane Goal status: MET  5.  Pt will ambulate up and down curb step using LRAD mod I for safe access to community and home. Baseline: CGA; 07/14/2021 mod I w/ tripod cane Goal status: MET  6.  Pt will ambulate >/= 350' without AD on level indoor surfaces to promote unlimited household independence. Baseline: 115' no AD; 07/14/2021 370' no AD Goal status: MET  LONG TERM GOALS:  Target date: 08/11/2021  Pt will be IND with final HEP in order to indicate improved functional mobility and dec fall risk. Baseline:  Established, needs updates. Goal status: ONGOING  2.  Pt will improve gait speed to >/=2.62 ft/sec w/LRAD in order to indicate dec fall risk. Baseline: 2.03 ft/sec w/ quad tip cane and no AFO 06/09/2021 Goal status: ONGOING  3.  Pt will ambulate x 500' continuously outdoors over unlevel paved and grassy surfaces w/ LRAD at mod I level in order to indicate improved community mobility.  Baseline: 250' + 100' + 150' SBA-CGA w/ 1 seated and 2 standing rest breaks Goal status: IN PROGRESS  4.  Pt will improve DGI by 6 points from baseline in order to indicate dec fall risk.  Baseline: 12/24; 15/24 06/23/2021 Goal  status: IN PROGRESS  ASSESSMENT:   CLINICAL IMPRESSION: Focus of skilled session on closed chain flexion with high repetitions and holds to promote activation of quads and hamstrings prior to dynamic balance tasks.  Pt is demonstrating increased volitional knee flexion during open chain tasks, but continues to need min cues to engage hamstrings with ambulation.  Will continue to address strength and balance deficits in remaining sessions.  OBJECTIVE IMPAIRMENTS Abnormal gait, decreased activity tolerance, decreased balance, decreased mobility, decreased ROM, decreased strength, impaired  flexibility, impaired UE functional use, and postural dysfunction.    ACTIVITY LIMITATIONS driving, occupation, and shopping.    PERSONAL FACTORS Time since onset of injury/illness/exacerbation and 3+ comorbidities: see above  are also affecting patient's functional outcome.      REHAB POTENTIAL: Good   CLINICAL DECISION MAKING: Evolving/moderate complexity   EVALUATION COMPLEXITY: Moderate   PLAN: PT FREQUENCY: 2x/week   PT DURATION: 8 weeks   PLANNED INTERVENTIONS: Therapeutic exercises, Therapeutic activity, Neuromuscular re-education, Balance training, Gait training, Patient/Family education, Vestibular training, Orthotic/Fit training, DME instructions, Aquatic Therapy, and Manual therapy   PLAN FOR NEXT SESSION:   STS w/ alt LE elevated from lowest height vs STS from airdisc/airex from bench height, squats to bench-add wt.  Theraband assist DF/hip/knee flexion with gait and standing balance tasks. She would benefit from dual tasking being added to walking or other low intensity task.  Stretching and flexion patterns to reduce tone (sci fit before NMR), continue to work with L ottobock reaction AFO , LLE NMR-could continue with high intensity gait training (resisted gait-have help, treadmill with weight on LLE), hip flex stretch/quad stretch.  Gait with cane and without, glut strength, quad control on  LLE, elliptical?  Elease Etienne, PT, DPT [facility] [address] [place], [place], [address] Phone: [phone]   Fax:  [phone] 07/28/21, 4:33 PM

## 2021-08-01 ENCOUNTER — Ambulatory Visit: Payer: 59 | Admitting: Rehabilitation

## 2021-08-04 ENCOUNTER — Encounter: Payer: Self-pay | Admitting: Physical Therapy

## 2021-08-04 ENCOUNTER — Ambulatory Visit: Payer: 59 | Admitting: Physical Therapy

## 2021-08-04 VITALS — BP 139/61 | HR 75

## 2021-08-04 DIAGNOSIS — R2681 Unsteadiness on feet: Secondary | ICD-10-CM

## 2021-08-04 DIAGNOSIS — I69354 Hemiplegia and hemiparesis following cerebral infarction affecting left non-dominant side: Secondary | ICD-10-CM | POA: Diagnosis not present

## 2021-08-04 DIAGNOSIS — R2689 Other abnormalities of gait and mobility: Secondary | ICD-10-CM

## 2021-08-04 NOTE — Therapy (Addendum)
OUTPATIENT PHYSICAL THERAPY TREATMENT NOTE  Patient Name: Donna [name] MRN: [id number] DOB:1950/10/14, 71 y.o., female Today's Date: 08/04/2021  PCP: Care, [facility] Bloss[facility] REFERRING PROVIDER: Delford Field, FNP PT progress note for [name].  Reporting period 06/21/2021 to 08/04/2021  See Note below for Objective Data and Assessment of Progress/Goals  Thank you for the referral of this patient. Elease Etienne, PT, DPT  END OF SESSION:   PT End of Session - 08/04/21 1418     Visit Number 20    Number of Visits 33    Date for PT Re-Evaluation 08/25/21    Authorization Type UHC Medicare and Medicaid (needs progress note)    Progress Note Due on Visit 20    PT Start Time 1410   PT ran over with previous appt   PT Stop Time 1445   limited by transportation needing pt at door by 2:50pm   PT Time Calculation (min) 35 min    Equipment Utilized During Treatment Gait belt    Activity Tolerance Patient tolerated treatment well    Behavior During Therapy WFL for tasks assessed/performed              Past Medical History:  Diagnosis Date   Allergy    Anxiety    Depression    Hyperlipidemia    no meds    Hypertension    Past Surgical History:  Procedure Laterality Date   DENTAL SURGERY     NSVD     x2   TUBAL LIGATION  1976   Patient Active Problem List   Diagnosis Date Noted   Cough    Cigarette nicotine dependence without complication    Stroke ([place]) 05/25/2020   Primary hypertension     REFERRING DIAG: G29.528 (ICD-10-CM) - Hemiplegia and hemiparesis following cerebral infarction affecting left non-dominant side   THERAPY DIAG:  Hemiplegia and hemiparesis following cerebral infarction affecting left non-dominant side (HCC)  Unsteadiness on feet  Other abnormalities of gait and mobility  PERTINENT HISTORY: Anxiety, Depression, Hyperlipidemia, Hypertension  PRECAUTIONS: Fall  SUBJECTIVE: No falls.  Nothing new.  Has been  keeping her "granddog".  PAIN:  Are you having pain? No   VITALS:  (RUE in sitting) Today's Vitals   08/04/21 1444  BP: 139/61  Pulse: 75    TODAY'S TREATMENT:  -SciFit x8 mins in hill mode L 3.0 for HIIT style strengthening w/ only BLE and to promote cardiovascular endurance.   -Circuit training for endurance completed x3:  STS w/ 5# kettlebell x10 > 115' on track no AD > adv/retreat over 4" hurdle w/ LLE in stance  BP assessed following 2 mins rest at cessation of activity.  PATIENT EDUCATION: Education details:  Discussed scheduling conflict next Friday due to [facility] appt.  Continue HEP and formal walking program. Person educated: Patient Education method: Explanation Education comprehension: verbalized understanding     HOME EXERCISE PROGRAM: Access Code: [email or web address] URL: [email or web address] Date: 05/12/2021 Prepared by: Mickie Bail Plaster  Exercises - Seated Gastroc Stretch with Strap  - 1 x daily - 5 x weekly - 2 sets - 2 reps - 30 seconds hold - Heel Raises with Counter Support  - 1 x daily - 4 x weekly - 12 reps - Seated Hamstring Curls with Resistance  - 1 x daily - 5 x weekly - 2 sets - 10 reps - Standing Single Leg Stance with Counter Support  - 1 x daily - 5 x weekly - 1 sets - 2 reps -  30 seconds hold - Standing March with Counter Support  - 1 x daily - 5 x weekly - 2 sets - 20 reps - Supine Quadriceps Stretch with Strap on Table  - 1 x daily - 7 x weekly - 3 sets - 10 reps - Modified Thomas Stretch  - 1 x daily - 7 x weekly - 1 sets - 3 reps - 30 secs hold - Forward Monster Walk with Resistance at [employer] and Counter Support  - 1 x daily - 5 x weekly - 3 sets - 10 reps - Side Stepping with Resistance at [employer] and Counter Support  - 1 x daily - 5 x weekly - 3 sets - 10 reps  Walking program:  Walk everyday 2-3 minutes at a time 5x per day.  Take your cell phone in the event that no one can go with you.  Walk on flat, paved, or indoor surfaces.     UPDATED GOALS: SHORT TERM GOALS:   Target date: 07/14/2021  Walking program will be initiated and pt will report compliance to maintain aerobic and gait training gains. Baseline: Established and pt states compliance. Goal status: MET  2.  2.  Pt will improve gait speed to >/=2.3 ft/sec w/LRAD in order to indicate dec fall risk. Baseline: 2.03 ft/sec w/ quad tip cane and no AFO 06/09/2021; 07/14/2021 2.41 ft/sec w/ tripod cane no AFO Goal status: MET  3.  Pt will negotiate 8 stairs using quad cane step-to gait mod I in order to promote safe community and home access. Baseline: CGA on descent; 07/14/2021 minA due to LOB on descent Goal status: NOT MET  4.  Pt will ambulate up and down ramp and inclined surfaces using LRAD mod I for safe community ambulation. Baseline: SBA-CGA; 07/14/2021 mod I w/ tripod cane Goal status: MET  5.  Pt will ambulate up and down curb step using LRAD mod I for safe access to community and home. Baseline: CGA; 07/14/2021 mod I w/ tripod cane Goal status: MET  6.  Pt will ambulate >/= 350' without AD on level indoor surfaces to promote unlimited household independence. Baseline: 115' no AD; 07/14/2021 370' no AD Goal status: MET  LONG TERM GOALS:  Target date: 08/11/2021  Pt will be IND with final HEP in order to indicate improved functional mobility and dec fall risk. Baseline:  Established, needs updates. Goal status: ONGOING  2.  Pt will improve gait speed to >/=2.62 ft/sec w/LRAD in order to indicate dec fall risk. Baseline: 2.03 ft/sec w/ quad tip cane and no AFO 06/09/2021 Goal status: ONGOING  3.  Pt will ambulate x 500' continuously outdoors over unlevel paved and grassy surfaces w/ LRAD at mod I level in order to indicate improved community mobility.  Baseline: 250' + 100' + 150' SBA-CGA w/ 1 seated and 2 standing rest breaks Goal status: IN PROGRESS  4.  Pt will improve DGI by 6 points from baseline in order to indicate dec fall risk.  Baseline:  12/24; 15/24 06/23/2021 Goal status: IN PROGRESS  ASSESSMENT:   CLINICAL IMPRESSION: Focus of skilled session today on circuit training and tasks for endurance.  Session cut short due to prior session running over and transportation time limitations.  Will further review and prep HEP for discharge in coming visits.  Pt is making progress towards LTGs and demonstrating compliance to HEP with noted progress in activity tolerance, ambulation w/ cane vs no AD, and better flexion engagement of LLE in open chain.  OBJECTIVE IMPAIRMENTS Abnormal gait, decreased activity tolerance, decreased balance, decreased mobility, decreased ROM, decreased strength, impaired flexibility, impaired UE functional use, and postural dysfunction.    ACTIVITY LIMITATIONS driving, occupation, and shopping.    PERSONAL FACTORS Time since onset of injury/illness/exacerbation and 3+ comorbidities: see above  are also affecting patient's functional outcome.      REHAB POTENTIAL: Good   CLINICAL DECISION MAKING: Evolving/moderate complexity   EVALUATION COMPLEXITY: Moderate   PLAN: PT FREQUENCY: 2x/week   PT DURATION: 8 weeks   PLANNED INTERVENTIONS: Therapeutic exercises, Therapeutic activity, Neuromuscular re-education, Balance training, Gait training, Patient/Family education, Vestibular training, Orthotic/Fit training, DME instructions, Aquatic Therapy, and Manual therapy   PLAN FOR NEXT SESSION:  Review and update HEP in preparation for D/C!  STS w/ alt LE elevated from lowest height vs STS from airdisc/airex from bench height, squats to bench-add wt.  Theraband assist DF/hip/knee flexion with gait and standing balance tasks. She would benefit from dual tasking being added to walking or other low intensity task.  Stretching and flexion patterns to reduce tone (sci fit before NMR), continue to work with L ottobock reaction AFO , LLE NMR-could continue with high intensity gait training (resisted gait-have help,  treadmill with weight on LLE), hip flex stretch/quad stretch.  Gait with cane and without, glut strength, quad control on LLE, elliptical?  Elease Etienne, PT, DPT [facility] [address] [place], [place], [address] Phone: [phone]   Fax:  [phone] 08/04/21, 4:16 PM

## 2021-08-08 ENCOUNTER — Encounter: Payer: Self-pay | Admitting: Rehabilitation

## 2021-08-08 ENCOUNTER — Ambulatory Visit: Payer: 59 | Admitting: Rehabilitation

## 2021-08-08 DIAGNOSIS — I69354 Hemiplegia and hemiparesis following cerebral infarction affecting left non-dominant side: Secondary | ICD-10-CM

## 2021-08-08 DIAGNOSIS — R2689 Other abnormalities of gait and mobility: Secondary | ICD-10-CM

## 2021-08-08 DIAGNOSIS — R2681 Unsteadiness on feet: Secondary | ICD-10-CM

## 2021-08-08 NOTE — Therapy (Signed)
OUTPATIENT PHYSICAL THERAPY TREATMENT NOTE  Patient Name: Donna [name] MRN: [id number] DOB:Feb 20, 1950, 71 y.o., female Today's Date: 08/08/2021  PCP: Care, Jinny Blossom [facility] REFERRING PROVIDER: Delford Field, FNP PT progress note for Donna [name].  Reporting period 06/21/2021 to 08/04/2021  See Note below for Objective Data and Assessment of Progress/Goals  Thank you for the referral of this patient. Elease Etienne, PT, DPT  END OF SESSION:   PT End of Session - 08/08/21 1307     Visit Number 21    Number of Visits 33    Date for PT Re-Evaluation 08/25/21    Authorization Type UHC Medicare and Medicaid (needs progress note)    Progress Note Due on Visit 20    PT Start Time 1303    PT Stop Time 1350    PT Time Calculation (min) 47 min    Equipment Utilized During Treatment Gait belt    Activity Tolerance Patient tolerated treatment well    Behavior During Therapy WFL for tasks assessed/performed              Past Medical History:  Diagnosis Date   Allergy    Anxiety    Depression    Hyperlipidemia    no meds    Hypertension    Past Surgical History:  Procedure Laterality Date   DENTAL SURGERY     NSVD     x2   TUBAL LIGATION  1976   Patient Active Problem List   Diagnosis Date Noted   Cough    Cigarette nicotine dependence without complication    Stroke ([place]) 05/25/2020   Primary hypertension     REFERRING DIAG: A16.553 (ICD-10-CM) - Hemiplegia and hemiparesis following cerebral infarction affecting left non-dominant side   THERAPY DIAG:  Hemiplegia and hemiparesis following cerebral infarction affecting left non-dominant side (HCC)  Unsteadiness on feet  Other abnormalities of gait and mobility  PERTINENT HISTORY: Anxiety, Depression, Hyperlipidemia, Hypertension  PRECAUTIONS: Fall  SUBJECTIVE: No falls.  Nothing new.  Has been keeping her "granddog".  PAIN:  Are you having pain? No   VITALS:  (RUE in  sitting) There were no vitals filed for this visit.   TODAY'S TREATMENT:   Session spent going over current HEP and updating/modifying as needed to have adequate challenge as she is D/Cing next visit.  See details below on exercises and reps performed.   PATIENT EDUCATION: Education details:  Discussed scheduling conflict next Friday due to [facility] appt.  Continue HEP and formal walking program. Person educated: Patient Education method: Explanation Education comprehension: verbalized understanding     HOME EXERCISE PROGRAM: Access Code: [email or web address] URL: [email or web address] Date: 08/08/2021 Prepared by: [place] with Counter Support  - 1 x daily - 4 x weekly - 12 reps - Seated Hamstring Curls with Resistance  - 1 x daily - 5 x weekly - 2 sets - 10 reps - Standing Single Leg Stance with Counter Support  - 1 x daily - 5 x weekly - 1 sets - 2 reps - 30 seconds hold - Supine Quadriceps Stretch with Strap on Table  - 1 x daily - 7 x weekly - 3 sets - 10 reps - Modified Thomas Stretch  - 1 x daily - 7 x weekly - 1 sets - 3 reps - 30 secs hold - Forward Monster Walk with Resistance at [employer] and Counter Support  - 1 x daily - 5 x weekly - 3 sets - 10 reps -  Side Stepping with Resistance at Thighs and Counter Support  - 1 x daily - 5 x weekly - 3 sets - 10 reps - Standing Gastroc Stretch  - 1 x daily - 7 x weekly - 3 sets - 10 reps - Walking March  - 1 x daily - 7 x weekly - 3 sets - 10 reps - Alternating Step Taps with Counter Support  - 1 x daily - 7 x weekly - 3 sets - 10 reps  Walking program:  Walk everyday 2-3 minutes at a time 5x per day.  Take your cell phone in the event that no one can go with you.  Walk on flat, paved, or indoor surfaces.    UPDATED GOALS: SHORT TERM GOALS:   Target date: 07/14/2021  Walking program will be initiated and pt will report compliance to maintain aerobic and gait training gains. Baseline: Established and pt  states compliance. Goal status: MET  2.  2.  Pt will improve gait speed to >/=2.3 ft/sec w/LRAD in order to indicate dec fall risk. Baseline: 2.03 ft/sec w/ quad tip cane and no AFO 06/09/2021; 07/14/2021 2.41 ft/sec w/ tripod cane no AFO Goal status: MET  3.  Pt will negotiate 8 stairs using quad cane step-to gait mod I in order to promote safe community and home access. Baseline: CGA on descent; 07/14/2021 minA due to LOB on descent Goal status: NOT MET  4.  Pt will ambulate up and down ramp and inclined surfaces using LRAD mod I for safe community ambulation. Baseline: SBA-CGA; 07/14/2021 mod I w/ tripod cane Goal status: MET  5.  Pt will ambulate up and down curb step using LRAD mod I for safe access to community and home. Baseline: CGA; 07/14/2021 mod I w/ tripod cane Goal status: MET  6.  Pt will ambulate >/= 350' without AD on level indoor surfaces to promote unlimited household independence. Baseline: 115' no AD; 07/14/2021 370' no AD Goal status: MET  LONG TERM GOALS:  Target date: 08/11/2021  Pt will be IND with final HEP in order to indicate improved functional mobility and dec fall risk. Baseline:  Established, needs updates. Goal status: ONGOING  2.  Pt will improve gait speed to >/=2.62 ft/sec w/LRAD in order to indicate dec fall risk. Baseline: 2.03 ft/sec w/ quad tip cane and no AFO 06/09/2021 Goal status: ONGOING  3.  Pt will ambulate x 500' continuously outdoors over unlevel paved and grassy surfaces w/ LRAD at mod I level in order to indicate improved community mobility.  Baseline: 250' + 100' + 150' SBA-CGA w/ 1 seated and 2 standing rest breaks Goal status: IN PROGRESS  4.  Pt will improve DGI by 6 points from baseline in order to indicate dec fall risk.  Baseline: 12/24; 15/24 06/23/2021 Goal status: IN PROGRESS  ASSESSMENT:   CLINICAL IMPRESSION: Focus of skilled session today was updating and modifying current HEP to be appropriately challenging since she is  [place] next visit.  Pt doing well and will be ready for DC next week.   OBJECTIVE IMPAIRMENTS Abnormal gait, decreased activity tolerance, decreased balance, decreased mobility, decreased ROM, decreased strength, impaired flexibility, impaired UE functional use, and postural dysfunction.    ACTIVITY LIMITATIONS driving, occupation, and shopping.    PERSONAL FACTORS Time since onset of injury/illness/exacerbation and 3+ comorbidities: see above  are also affecting patient's functional outcome.      REHAB POTENTIAL: Good   CLINICAL DECISION MAKING: Evolving/moderate complexity   EVALUATION COMPLEXITY: Moderate  PLAN: PT FREQUENCY: 2x/week   PT DURATION: 8 weeks   PLANNED INTERVENTIONS: Therapeutic exercises, Therapeutic activity, Neuromuscular re-education, Balance training, Gait training, Patient/Family education, Vestibular training, Orthotic/Fit training, DME instructions, Aquatic Therapy, and Manual therapy   PLAN FOR NEXT SESSION:  LTGs and D/C  [place] Sprang, PT, MPT [facility] [address] [place], [place], [address] Phone: [phone]   Fax:  [phone] 08/08/21, 2:10 PM

## 2021-08-11 ENCOUNTER — Ambulatory Visit: Payer: 59 | Admitting: Physical Therapy

## 2021-08-16 ENCOUNTER — Ambulatory Visit: Payer: 59 | Admitting: Physical Therapy

## 2021-08-18 ENCOUNTER — Ambulatory Visit: Payer: 59 | Admitting: Physical Therapy

## 2021-08-23 ENCOUNTER — Ambulatory Visit: Payer: 59 | Attending: Nurse Practitioner | Admitting: Physical Therapy

## 2021-08-23 ENCOUNTER — Encounter: Payer: Self-pay | Admitting: Physical Therapy

## 2021-08-23 VITALS — BP 120/80 | HR 70

## 2021-08-23 DIAGNOSIS — R2681 Unsteadiness on feet: Secondary | ICD-10-CM | POA: Diagnosis present

## 2021-08-23 DIAGNOSIS — R2689 Other abnormalities of gait and mobility: Secondary | ICD-10-CM | POA: Diagnosis present

## 2021-08-23 DIAGNOSIS — I69354 Hemiplegia and hemiparesis following cerebral infarction affecting left non-dominant side: Secondary | ICD-10-CM | POA: Diagnosis present

## 2021-08-23 NOTE — Therapy (Signed)
OUTPATIENT PHYSICAL THERAPY TREATMENT NOTE/DISCHARGE SUMMARY/RE-CERT FOR DISCHARGE  Patient Name: Donna [name] MRN: [id number] DOB:[birth year], 71 y.o., female Today's Date: 08/23/2021  PCP: Care, J[facility] REFERRING PROVIDER: Delford Field, FNP  PHYSICAL THERAPY DISCHARGE SUMMARY  Visits from Start of Care: 22  Current functional level related to goals / functional outcomes: See clinical assessment below.   Remaining deficits: LLE extensor tone - dec hip/knee flexion in swing, dec DF in swing phase of gait   Education / Equipment: Discharge plan, continue updated HEP to maintain gains, use of AFO once received especially with long distances/unlevel ground  Patient agrees to discharge. Patient goals were partially met. Patient is being discharged due to maximized rehab potential.    END OF SESSION:   PT End of Session - 08/23/21 1410     Visit Number 22    Number of Visits 33    Date for PT Re-Evaluation 08/25/21    Authorization Type [employer] Medicare and Medicaid (needs progress note)    Progress Note Due on Visit 20    PT Start Time 1407    PT Stop Time 1450    PT Time Calculation (min) 43 min    Equipment Utilized During Treatment Gait belt    Activity Tolerance Patient tolerated treatment well    Behavior During Therapy WFL for tasks assessed/performed              Past Medical History:  Diagnosis Date   Allergy    Anxiety    Depression    Hyperlipidemia    no meds    Hypertension    Past Surgical History:  Procedure Laterality Date   DENTAL SURGERY     NSVD     x2   TUBAL LIGATION  1976   Patient Active Problem List   Diagnosis Date Noted   Cough    Cigarette nicotine dependence without complication    Stroke ([place]) 05/25/2020   Primary hypertension     REFERRING DIAG: R15.400 (ICD-10-CM) - Hemiplegia and hemiparesis following cerebral infarction affecting left non-dominant side   THERAPY DIAG:  Hemiplegia and  hemiparesis following cerebral infarction affecting left non-dominant side (HCC)  Unsteadiness on feet  Other abnormalities of gait and mobility  PERTINENT HISTORY: Anxiety, Depression, Hyperlipidemia, Hypertension  PRECAUTIONS: Fall  SUBJECTIVE: Pt denies acute changes, states he BP has been good at home (137/67 on most recent home reading last week).  She has been walking regularly and states new exercises added last session are good.  She went to [facility] and he assessed her and he gave her an appt for the fitting on 09/01/2021.  PAIN:  Are you having pain? No   VITALS:  (RUE in sitting) Today's Vitals   08/23/21 1416  BP: 120/80  Pulse: 70     TODAY'S TREATMENT:     [facility] PT Assessment - 08/23/21 1421       Dynamic Gait Index   Level Surface Mild Impairment    Change in Gait Speed Moderate Impairment    Gait with Horizontal Head Turns Mild Impairment    Gait with Vertical Head Turns Normal    Gait and Pivot Turn Mild Impairment    Step Over Obstacle Moderate Impairment    Step Around Obstacles Normal    Steps Moderate Impairment    Total Score 15    DGI comment: High Fall Risk            GAIT: Gait pattern: step to pattern, step through  pattern, decreased arm swing- Left, decreased stride length, decreased ankle dorsiflexion- Left, Left foot flat, and poor foot clearance- Left Distance walked: 500' (outdoors over sidewalk, grass, and incline) + 45'x2 (indoors on level floor) Assistive device utilized:  tripod cane Level of assistance: Modified independence and SBA Comments: 2 instances of inc toe drag in grass requiring close supervision, no overt LOB, pt compensates well with decreased step size and improved cane placement.  1 instance of toe catch on lip of single pavement slab w/o LOB.  Pt has mild difficulty clearing large curled wire on pavement requiring inc time to step over w/ RLE leading.    -Assessed 10MWT w/ tripod cane:  11.91 sec = 0.84 m/sec OR 2.77  ft/sec  -  PATIENT EDUCATION: Education details:  Discussed discharge plan for today and progress towards goals. Person educated: Patient Education method: Explanation Education comprehension: verbalized understanding     HOME EXERCISE PROGRAM: Access Code: [email or web address] URL: [email or web address] Date: 08/08/2021 Prepared by: [place] with Counter Support  - 1 x daily - 4 x weekly - 12 reps - Seated Hamstring Curls with Resistance  - 1 x daily - 5 x weekly - 2 sets - 10 reps - Standing Single Leg Stance with Counter Support  - 1 x daily - 5 x weekly - 1 sets - 2 reps - 30 seconds hold - Supine Quadriceps Stretch with Strap on Table  - 1 x daily - 7 x weekly - 3 sets - 10 reps - Modified Thomas Stretch  - 1 x daily - 7 x weekly - 1 sets - 3 reps - 30 secs hold Statistician Walk with Resistance at [employer] and Counter Support  - 1 x daily - 5 x weekly - 3 sets - 10 reps - Side Stepping with Resistance at [employer] and Counter Support  - 1 x daily - 5 x weekly - 3 sets - 10 reps - Standing Gastroc Stretch  - 1 x daily - 7 x weekly - 3 sets - 10 reps - Walking March  - 1 x daily - 7 x weekly - 3 sets - 10 reps - Alternating Step Taps with Counter Support  - 1 x daily - 7 x weekly - 3 sets - 10 reps  Walking program:  Walk everyday 2-3 minutes at a time 5x per day.  Take your cell phone in the event that no one can go with you.  Walk on flat, paved, or indoor surfaces.    UPDATED GOALS: SHORT TERM GOALS:   Target date: 07/14/2021  Walking program will be initiated and pt will report compliance to maintain aerobic and gait training gains. Baseline: Established and pt states compliance. Goal status: MET  2.  2.  Pt will improve gait speed to >/=2.3 ft/sec w/LRAD in order to indicate dec fall risk. Baseline: 2.03 ft/sec w/ quad tip cane and no AFO 06/09/2021; 07/14/2021 2.41 ft/sec w/ tripod cane no AFO Goal status: MET  3.  Pt will negotiate 8  stairs using quad cane step-to gait mod I in order to promote safe community and home access. Baseline: CGA on descent; 07/14/2021 minA due to LOB on descent Goal status: NOT MET  4.  Pt will ambulate up and down ramp and inclined surfaces using LRAD mod I for safe community ambulation. Baseline: SBA-CGA; 07/14/2021 mod I w/ tripod cane Goal status: MET  5.  Pt will ambulate up and down curb step using LRAD  mod I for safe access to community and home. Baseline: CGA; 07/14/2021 mod I w/ tripod cane Goal status: MET  6.  Pt will ambulate >/= 350' without AD on level indoor surfaces to promote unlimited household independence. Baseline: 115' no AD; 07/14/2021 370' no AD Goal status: MET  LONG TERM GOALS:  Target date: 08/11/2021  Pt will be IND with final HEP in order to indicate improved functional mobility and dec fall risk. Baseline:  Established, updated, pt compliant. Goal status:  MET  2.  Pt will improve gait speed to >/=2.62 ft/sec w/LRAD in order to indicate dec fall risk. Baseline: 2.03 ft/sec w/ quad tip cane and no AFO 06/09/2021; 08/23/2021 2.77 ft/sec w/ tripod cane and w/o AFO Goal status: MET  3.  Pt will ambulate x 500' continuously outdoors over unlevel paved and grassy surfaces w/ LRAD at mod I level in order to indicate improved community mobility.  Baseline: 250' + 100' + 150' SBA-CGA w/ 1 seated and 2 standing rest breaks; 08/23/2021 500' various outdoor surfaces w/ tripod cane SBA-modI Goal status: NOT MET  4.  Pt will improve DGI by 6 points from baseline in order to indicate dec fall risk.  Baseline: 12/24; 15/24 06/23/2021; 15/24 08/23/2021 Goal status: NOT MET  ASSESSMENT:   CLINICAL IMPRESSION:  Assessment of LTGs this session in preparation for pt discharge today.  Pt maintains progress on DGI with consistent score of 15/24 indicating continued elevated fall risk.  She ambulates outdoors >500' on unlevel and grassy surfaces using tripod cane and without AFO.  Due to  left toe drag with increased distance pt has intermittent need for close supervision without LOB on steeper sidewalk incline and higher grass areas.  She compensates well with shortened step lengths and appropriate use of cane and is anticipated to be more stable with use of AFO during these tasks.  Her overall activity tolerance has improved with this episode of therapy and her gait speed has improved to 2.77 ft/sec from 2.03 ft/sec at evaluation.  She is compliant with walking routine and updated HEP from session prior.  Pt is appropriate for and in agreement with discharge today.  OBJECTIVE IMPAIRMENTS Abnormal gait, decreased activity tolerance, decreased balance, decreased mobility, decreased ROM, decreased strength, impaired flexibility, impaired UE functional use, and postural dysfunction.    ACTIVITY LIMITATIONS driving, occupation, and shopping.    PERSONAL FACTORS Time since onset of injury/illness/exacerbation and 3+ comorbidities: see above  are also affecting patient's functional outcome.      REHAB POTENTIAL: Good   CLINICAL DECISION MAKING: Evolving/moderate complexity   EVALUATION COMPLEXITY: Moderate   PLAN: PT FREQUENCY: 2x/week   PT DURATION: 8 weeks   PLANNED INTERVENTIONS: Therapeutic exercises, Therapeutic activity, Neuromuscular re-education, Balance training, Gait training, Patient/Family education, Vestibular training, Orthotic/Fit training, DME instructions, Aquatic Therapy, and Manual therapy   PLAN FOR NEXT SESSION:  N/A  Elease Etienne, PT, [facility] [address] [place], [place], [address] Phone: [phone]   Fax:  [phone] 08/23/21, 4:37 PM

## 2022-04-04 ENCOUNTER — Other Ambulatory Visit: Payer: Self-pay | Admitting: Family

## 2022-04-04 DIAGNOSIS — Z122 Encounter for screening for malignant neoplasm of respiratory organs: Secondary | ICD-10-CM

## 2022-04-05 ENCOUNTER — Other Ambulatory Visit: Payer: Self-pay | Admitting: Family

## 2022-04-05 DIAGNOSIS — Z1239 Encounter for other screening for malignant neoplasm of breast: Secondary | ICD-10-CM

## 2022-04-06 ENCOUNTER — Other Ambulatory Visit: Payer: Self-pay | Admitting: Family

## 2022-04-06 DIAGNOSIS — Z13820 Encounter for screening for osteoporosis: Secondary | ICD-10-CM

## 2022-05-30 ENCOUNTER — Ambulatory Visit: Payer: 59

## 2022-06-22 IMAGING — MR MR MRA HEAD W/O CM
2 series · 18 of 48 positions shown · non-contrast
Comparison: None.

CLINICAL DATA: Left facial droop and left-sided weakness



[Series 8: ax (id) · axial · 1.0mm · 0.43mm/px · z∈[-86,-1]mm · 17 of 183 slices shown]
[im 1/183]
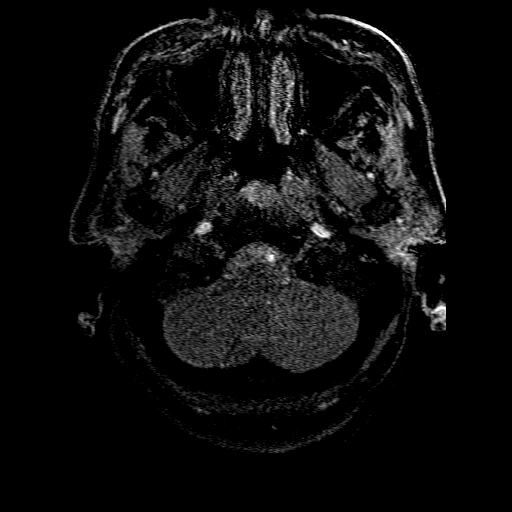
[im 4/183]
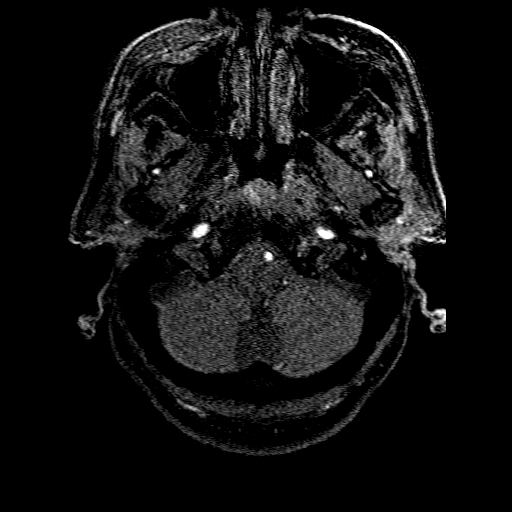
[im 8/183]
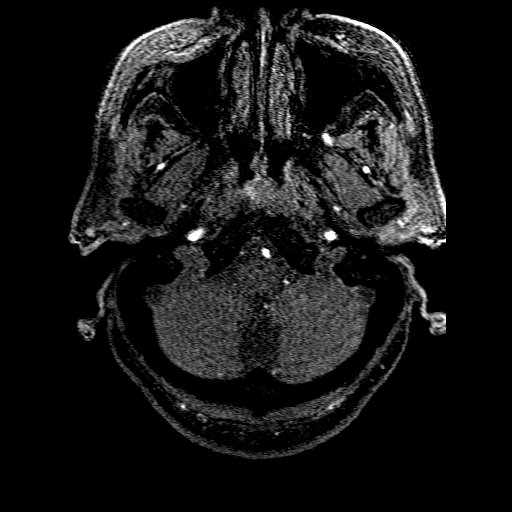
[im 12/183]
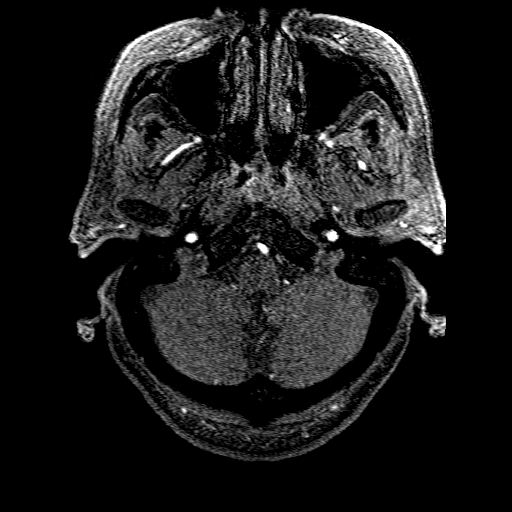
[im 16/183]
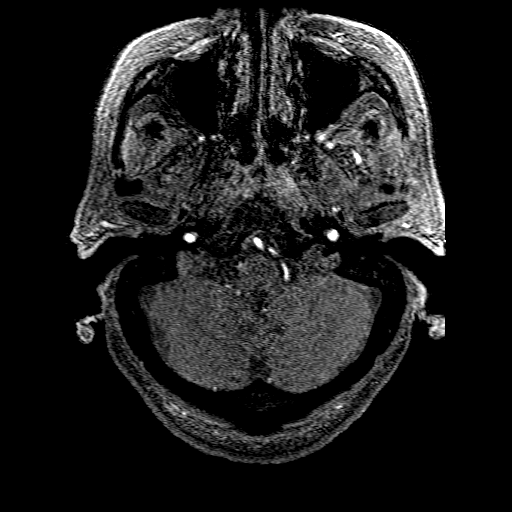
[im 20/183]
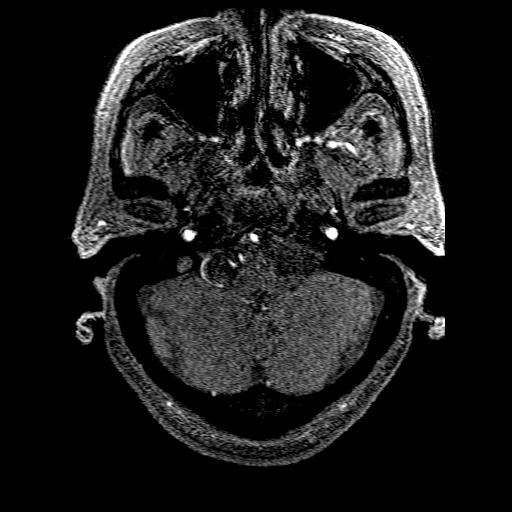
[im 24/183]
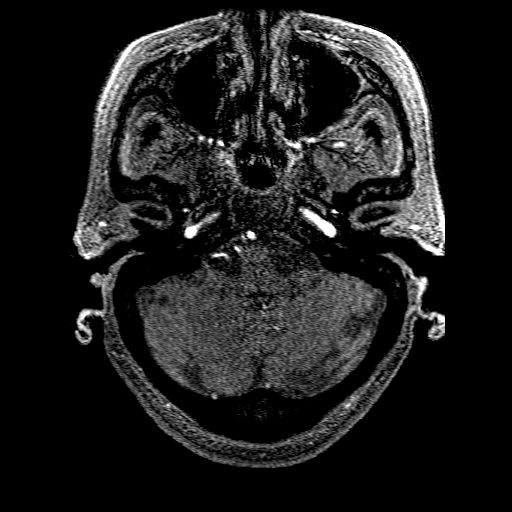
[im 28/183]
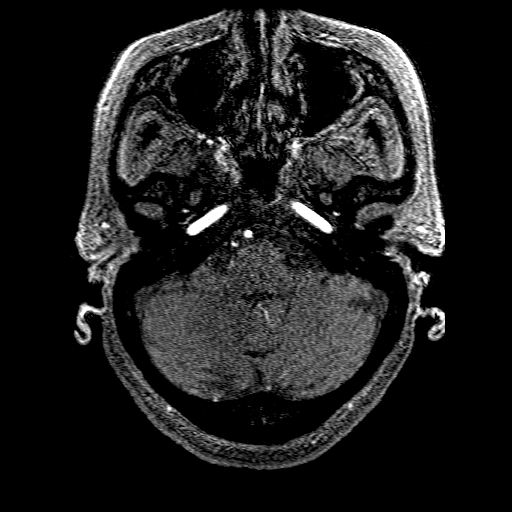
[im 32/183]
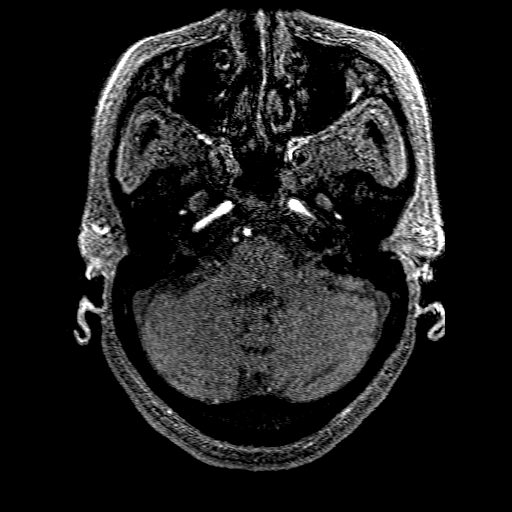
[im 56/183]
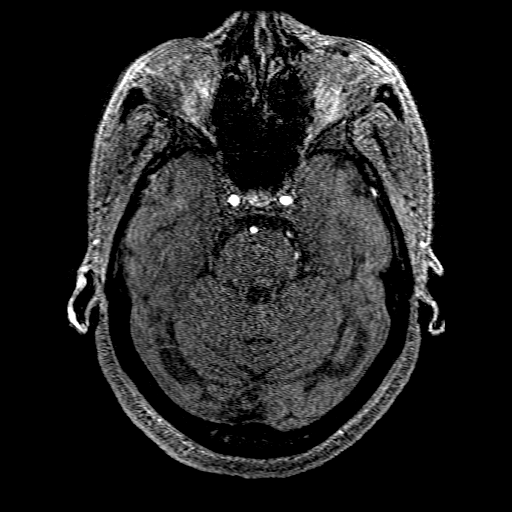
[im 80/183]
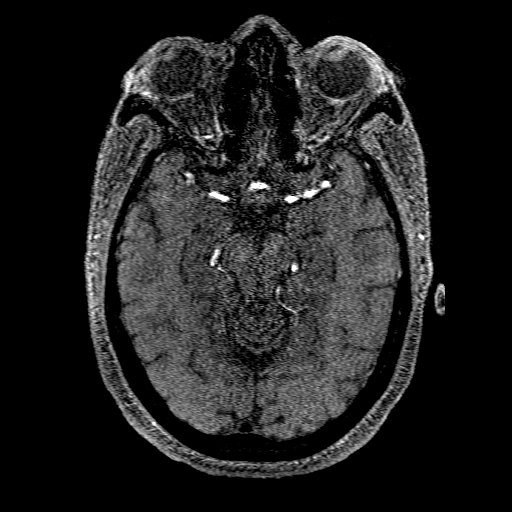
[im 92/183]
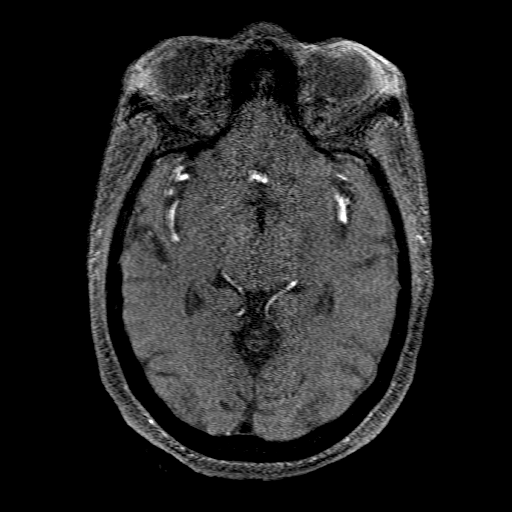
[im 103/183]
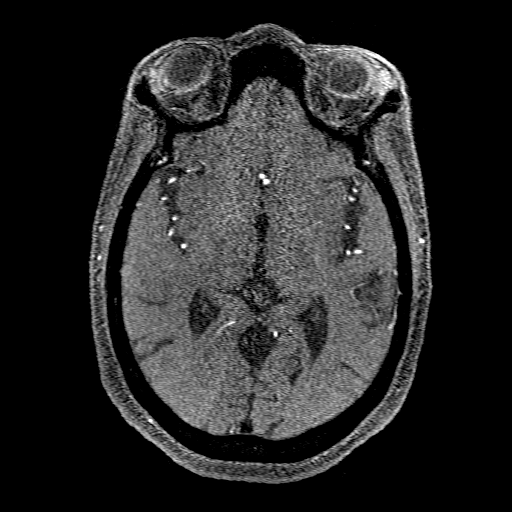
[im 127/183]
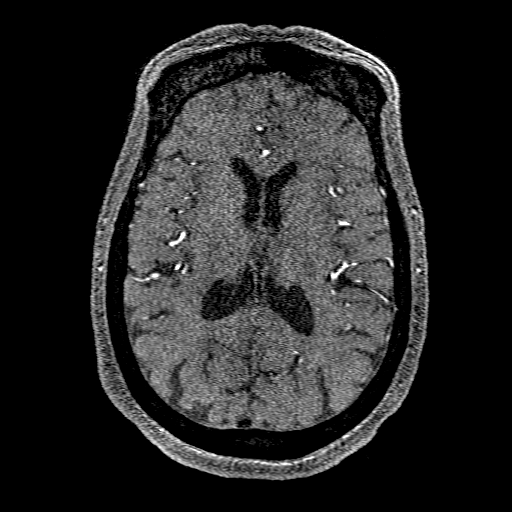
[im 151/183]
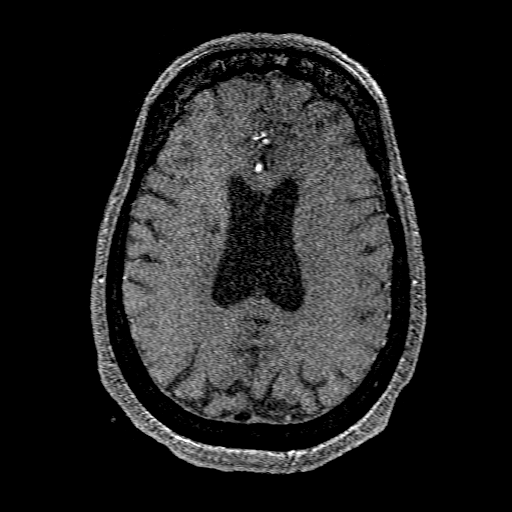
[im 155/183]
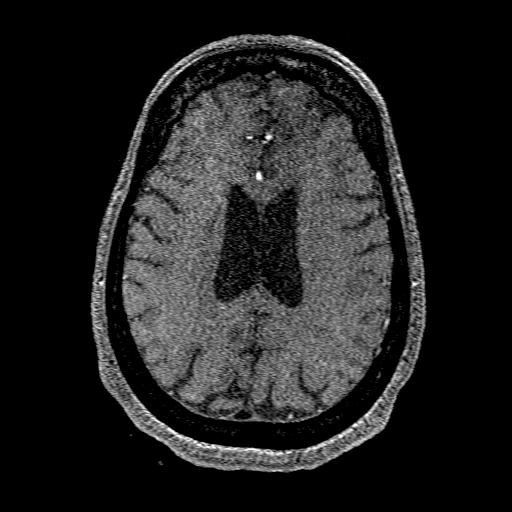
[im 175/183]
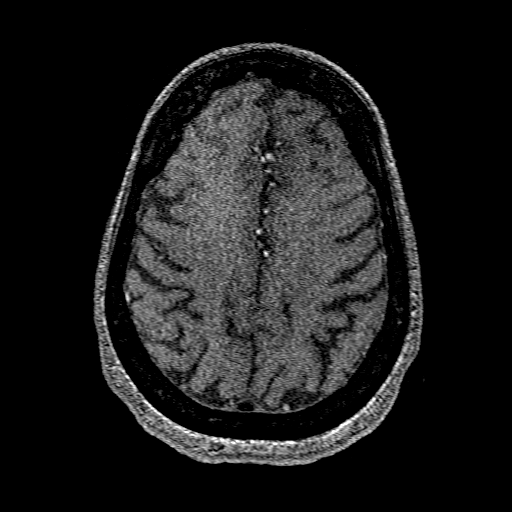

[Series 801: pjn:ax (id) · sagittal · 1.0mm · 0.43mm/px · 1 of 4 slices shown]
[im 1/4]
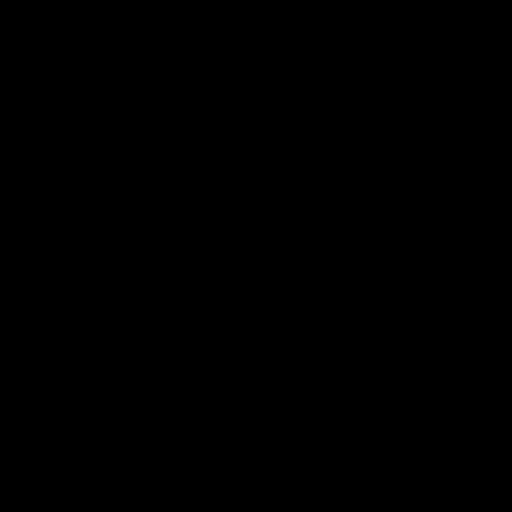

[18 of 48 positions shown; findings below may reference images not displayed]

FINDINGS: MRI HEAD FINDINGS

Motion artifact is present.

Brain: Diffusion hyperintensity with ADC isointensity is present in
the region of the posterior limb of the right internal capsule on
the axial sequence. However, this is not clearly present on the
coronal sequence. There is reduced diffusion in both planes along
the roof the right temporal horn and along the medial temporal lobe
involving the hippocampus. Punctate cortical foci of diffusion
hyperintensity are present in the right occipital lobe and left
posterior temporal lobe.

Chronic infarct of the right corona radiata. Focus of susceptibility
in the left temporal white matter is most compatible with chronic
microhemorrhage. Ventricles and sulci are within normal limits in
size and configuration. There is no intracranial mass, mass effect,
hydrocephalus, or extra-axial collection.

Vascular: Major vessel flow voids at the skull base are preserved.

Skull and upper cervical spine: Marrow signal is within normal
limits.

Sinuses/Orbits: Minor paranasal sinus mucosal thickening. Orbits are
unremarkable.

Other: Sella is unremarkable.  Mastoid air cells are aerated.

MRA HEAD FINDINGS

Intracranial internal carotid arteries are patent. Middle and
anterior cerebral arteries are patent. Included intracranial
vertebral arteries, basilar artery, posterior cerebral arteries are
patent. Given above findings on MRA neck, the visualized patent
intracranial right vertebral artery may reflect retrograde flow to
the PICA origin. Right posterior communicating artery is present.
Possible left posterior communicating artery is well. There is no
significant stenosis or aneurysm.

MRA NECK FINDINGS

Motion artifact is present. Common, internal, and external carotid
arteries are patent. No hemodynamically significant stenosis is
identified. Extracranial left vertebral artery is patent without
stenosis. There is no definite flow related enhancement within the
extracranial right vertebral artery.
IMPRESSION: Degraded by motion artifact.

Acute small infarcts along the roof of the right temporal horn and
head of the right hippocampus. Acute or subacute punctate cortical
right occipital and left temporal infarcts. Possible punctate
subacute infarct of posterior limb of right internal capsule.

Chronic infarct right corona radiata.

Occlusion or high-grade origin stenosis of extracranial right
vertebral artery. Intracranially, suspect retrograde flow to the
PICA origin. Otherwise patent anterior and posterior circulations
without stenosis.

## 2022-11-15 ENCOUNTER — Inpatient Hospital Stay: Admission: RE | Admit: 2022-11-15 | Payer: 59 | Source: Ambulatory Visit

## 2022-11-29 ENCOUNTER — Ambulatory Visit
Admission: RE | Admit: 2022-11-29 | Discharge: 2022-11-29 | Disposition: A | Discharge status: Home / Self Care | Payer: 59 | Source: Ambulatory Visit | Attending: Family | Admitting: Family

## 2022-11-29 DIAGNOSIS — Z1239 Encounter for other screening for malignant neoplasm of breast: Secondary | ICD-10-CM

## 2022-12-04 ENCOUNTER — Other Ambulatory Visit: Payer: Self-pay | Admitting: Family

## 2022-12-04 DIAGNOSIS — R928 Other abnormal and inconclusive findings on diagnostic imaging of breast: Secondary | ICD-10-CM

## 2022-12-20 ENCOUNTER — Ambulatory Visit
Admission: RE | Admit: 2022-12-20 | Discharge: 2022-12-20 | Disposition: A | Discharge status: Home / Self Care | Payer: 59 | Source: Ambulatory Visit | Attending: Family | Admitting: Family

## 2022-12-20 ENCOUNTER — Other Ambulatory Visit: Payer: Self-pay | Admitting: Family

## 2022-12-20 DIAGNOSIS — N6310 Unspecified lump in the right breast, unspecified quadrant: Secondary | ICD-10-CM

## 2022-12-20 DIAGNOSIS — R928 Other abnormal and inconclusive findings on diagnostic imaging of breast: Secondary | ICD-10-CM

## 2022-12-20 DIAGNOSIS — R921 Mammographic calcification found on diagnostic imaging of breast: Secondary | ICD-10-CM

## 2022-12-20 DIAGNOSIS — R599 Enlarged lymph nodes, unspecified: Secondary | ICD-10-CM

## 2022-12-20 DIAGNOSIS — N6320 Unspecified lump in the left breast, unspecified quadrant: Secondary | ICD-10-CM

## 2022-12-21 ENCOUNTER — Ambulatory Visit
Admission: RE | Admit: 2022-12-21 | Discharge: 2022-12-21 | Disposition: A | Discharge status: Home / Self Care | Payer: 59 | Source: Ambulatory Visit | Attending: Family | Admitting: Family

## 2022-12-21 DIAGNOSIS — R599 Enlarged lymph nodes, unspecified: Secondary | ICD-10-CM

## 2022-12-21 DIAGNOSIS — N6310 Unspecified lump in the right breast, unspecified quadrant: Secondary | ICD-10-CM

## 2022-12-21 DIAGNOSIS — R928 Other abnormal and inconclusive findings on diagnostic imaging of breast: Secondary | ICD-10-CM

## 2022-12-21 DIAGNOSIS — R921 Mammographic calcification found on diagnostic imaging of breast: Secondary | ICD-10-CM

## 2022-12-21 DIAGNOSIS — N6320 Unspecified lump in the left breast, unspecified quadrant: Secondary | ICD-10-CM

## 2022-12-21 HISTORY — PX: BREAST BIOPSY: SHX20

## 2022-12-24 LAB — SURGICAL PATHOLOGY

## 2022-12-25 ENCOUNTER — Telehealth: Payer: Self-pay | Admitting: *Deleted

## 2022-12-25 ENCOUNTER — Telehealth: Payer: Self-pay | Admitting: Hematology and Oncology

## 2022-12-25 NOTE — Telephone Encounter (Signed)
LVM for patient to return my call in reference to [facility] appointment on 12/18

## 2022-12-25 NOTE — Telephone Encounter (Signed)
Spoke to patient to confirm upcoming afternoon [facility] clinic appointment on 12/18, paperwork will be sent via mail.   Gave location and time, also informed patient that the surgeon's office would be calling as well to get information from them similar to the packet that they will be receiving so make sure to do both.  Reminded patient that all providers will be coming to the clinic to see them HERE and if they had any questions to not hesitate to reach back out to myself or their navigators.

## 2022-12-26 ENCOUNTER — Ambulatory Visit
Admission: RE | Admit: 2022-12-26 | Discharge: 2022-12-26 | Disposition: A | Discharge status: Home / Self Care | Payer: 59 | Source: Ambulatory Visit | Attending: Family | Admitting: Family

## 2022-12-26 DIAGNOSIS — R599 Enlarged lymph nodes, unspecified: Secondary | ICD-10-CM

## 2022-12-26 DIAGNOSIS — N6310 Unspecified lump in the right breast, unspecified quadrant: Secondary | ICD-10-CM

## 2022-12-26 DIAGNOSIS — R921 Mammographic calcification found on diagnostic imaging of breast: Secondary | ICD-10-CM

## 2022-12-26 DIAGNOSIS — R928 Other abnormal and inconclusive findings on diagnostic imaging of breast: Secondary | ICD-10-CM

## 2022-12-26 DIAGNOSIS — N6320 Unspecified lump in the left breast, unspecified quadrant: Secondary | ICD-10-CM

## 2022-12-26 DIAGNOSIS — N6311 Unspecified lump in the right breast, upper outer quadrant: Secondary | ICD-10-CM

## 2022-12-26 HISTORY — PX: BREAST BIOPSY: SHX20

## 2022-12-28 LAB — SURGICAL PATHOLOGY

## 2022-12-31 ENCOUNTER — Encounter: Payer: Self-pay | Admitting: *Deleted

## 2022-12-31 DIAGNOSIS — C50412 Malignant neoplasm of upper-outer quadrant of left female breast: Secondary | ICD-10-CM | POA: Insufficient documentation

## 2022-12-31 DIAGNOSIS — Z171 Estrogen receptor negative status [ER-]: Secondary | ICD-10-CM | POA: Insufficient documentation

## 2023-01-01 NOTE — Progress Notes (Signed)
Radiation Oncology         ([phone]) [phone] ________________________________  Name: Donna [name]        MRN: [id number]  Date of Service: 01/02/2023 DOB: Jan 17, 1950  XB:MWUX, Jovita Kussmaul Total Access  Griselda Miner, MD     REFERRING PHYSICIAN: Chevis Pretty III, MD   DIAGNOSIS: The primary encounter diagnosis was Malignant neoplasm of overlapping sites of right breast in female, estrogen receptor negative (HCC). A diagnosis of Malignant neoplasm of upper-outer quadrant of left breast in female, estrogen receptor positive (HCC) was also pertinent to this visit.   HISTORY OF PRESENT ILLNESS: Donna [name] is a 72 y.o. female seen in the multidisciplinary breast clinic for a new diagnosis of bilateral breast cancer. The patient was noted to have bilateral screening breast abnormalities on 01/28/22. She had left breast  thickening and skin retraction and calcifications. By diagnostic work up including ultrasound on 12/20/22, she had a 3.2 cm mass in the 3:00 position of the left breast as well as a 9 mm adjacent to this. One abnormal left axillary lymph node was also noted. On the right side, there was a mass in the UOQ and associated calcifications. By diagnostic work up  there was a 2.7 cm mass in the 11:00 position, an 8 mm mass in the 12:00 position, and posterior to the primary mass there were calcifications (not biopsied). The right axillar was negative for adenopathy. 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 The 9 mm lesion was biopsied and was benign. The left axillary node biopsy was consistent with metastatic disease. 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 The cancer cells were ER/PR negative HER2 amplified with a  Ki 67 of 15%. She's seen today to discuss treatment recommendations of her cancer.    PREVIOUS RADIATION THERAPY: No   PAST MEDICAL HISTORY:  Past Medical History:  Diagnosis Date   Allergy    Anxiety    Breast cancer (HCC)    Depression    Hyperlipidemia    no meds    Hypertension        PAST SURGICAL HISTORY: Past Surgical History:  Procedure Laterality Date   BREAST BIOPSY Left 12/21/2022   [place] LT BREAST BX W LOC DEV 1ST LESION IMG BX SPEC US GUIDE 12/21/2022 [facility]   BREAST BIOPSY Left 12/21/2022   [place] LT BREAST BX W LOC DEV EA ADD LESION IMG BX SPEC US GUIDE 12/21/2022 [facility]   BREAST BIOPSY Right 12/26/2022   [place] RT BREAST BX W LOC DEV 1ST LESION IMG BX SPEC US GUIDE 12/26/2022 [facility]   BREAST BIOPSY Right 12/26/2022   [place] RT BREAST BX W LOC DEV EA ADD LESION IMG BX SPEC US GUIDE 12/26/2022 [facility]   BREAST BIOPSY Right 12/26/2022   MM RT BREAST BX W LOC DEV 1ST LESION IMAGE BX SPEC STEREO GUIDE 12/26/2022 [facility]   DENTAL SURGERY     NSVD     x2   TUBAL LIGATION  1976     FAMILY HISTORY:  Family History  Problem Relation Age of Onset   Colon cancer Mother 39 - 40   Cancer Father 41 - 87       unknown type  Colon polyps Sister    Esophageal cancer Sister 71 - 92   Lung cancer Sister 58 - 51   Ovarian cancer Sister 69   Colon cancer Brother 50   Breast cancer Niece        dx. <50   Rectal cancer Neg Hx    Stomach cancer Neg Hx    Pancreatic cancer Neg Hx      SOCIAL HISTORY:  reports that she has been smoking. She has never used smokeless tobacco. She reports that she does not drink alcohol and does not use drugs. The patient is divorced and lives in [place]. She is accompanied by her daughter. 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    ALLERGIES: Patient has no known allergies.   MEDICATIONS:  Current Outpatient Medications  Medication Sig  Dispense Refill   albuterol (VENTOLIN HFA) 108 (90 Base) MCG/ACT inhaler Inhale 2 puffs into the lungs every 4 (four) hours as needed for wheezing or shortness of breath.     amLODipine (NORVASC) 10 MG tablet Take 1 tablet (10 mg total) by mouth daily.     aspirin EC 81 MG EC tablet Take 1 tablet (81 mg total) by mouth daily. Swallow whole. 30 tablet 11   busPIRone (BUSPAR) 5 MG tablet Take 5 mg by mouth 2 (two) times daily.  0   clopidogrel (PLAVIX) 75 MG tablet Take 1 tablet (75 mg total) by mouth daily.     dexamethasone (DECADRON) 4 MG tablet Take 2 tabs by mouth 2 times daily starting day before chemo. Then take 2 tabs daily for 2 days starting day after chemo. Take with food. 30 tablet 1   DULoxetine (CYMBALTA) 60 MG capsule Take 60 mg by mouth daily.     fluticasone (FLONASE) 50 MCG/ACT nasal spray Place 1 spray into both nostrils daily.     lidocaine-prilocaine (EMLA) cream Apply to affected area once 30 g 3   montelukast (SINGULAIR) 10 MG tablet Take 10 mg by mouth at bedtime.  0   nicotine (NICODERM CQ - DOSED IN MG/24 HR) 7 mg/24hr patch Place 1 patch (7 mg total) onto the skin daily as needed (Nicotine cravings). 28 patch 0   ondansetron (ZOFRAN) 8 MG tablet Take 1 tablet (8 mg total) by mouth every 8 (eight) hours as needed for nausea or vomiting. Start on the third day after chemotherapy. 30 tablet 1   prochlorperazine (COMPAZINE) 10 MG tablet Take 1 tablet (10 mg total) by mouth every 6 (six) hours as needed for nausea or vomiting. 30 tablet 1   rosuvastatin (CRESTOR) 20 MG tablet Take 1 tablet (20 mg total) by mouth daily.     traZODone (DESYREL) 50 MG tablet Take 50 mg by mouth at bedtime.  0   No current facility-administered medications for this encounter.     REVIEW OF SYSTEMS: On review of systems, the patient reports that she is doing well overall. She uses a cane to get around, and does not drive but has been able to still live independently since her stroke. No breast  specific complaints are noted.      PHYSICAL EXAM:  Wt Readings from Last 3 Encounters:  01/02/23 169 lb 6.4 oz (76.8 kg)  05/31/20 161 lb 13.1 oz (73.4 kg)  12/31/16 189 lb (85.7 kg)   Temp Readings from Last 3 Encounters:  01/02/23 98 F (36.7 C) (Temporal)  05/31/20 98.5 F (36.9 C) (Oral)  12/31/16 98.2 F (36.8 C)   BP Readings from Last 3 Encounters:  01/02/23 (!) 149/66  08/23/21 120/80  08/04/21 139/61   Pulse Readings from Last 3 Encounters:  01/02/23 74  08/23/21 70  08/04/21 75    In general this is a well appearing African American female in no acute distress. She's alert and oriented x4 and appropriate throughout the examination. Cardiopulmonary assessment is negative for acute distress and she exhibits normal effort. Bilateral breast exam is deferred.    ECOG = 1  0 - Asymptomatic (Fully active, able to carry on all predisease activities without restriction)  1 - Symptomatic but completely ambulatory (Restricted in physically strenuous activity but ambulatory and able to carry out work of a light or sedentary nature. For example, light housework, office work)  2 - Symptomatic, <50% in bed during the day (Ambulatory and capable of all self care but unable to carry out any work activities. Up and about more than 50% of waking hours)  3 - Symptomatic, >50% in bed, but not bedbound (Capable of only limited self-care, confined to bed or chair 50% or more of waking hours)  4 - Bedbound (Completely disabled. Cannot carry on any self-care. Totally confined to bed or chair)  5 - Death   Santiago Glad MM, Creech RH, Tormey DC, et al. [phone]). "Toxicity and response criteria of the [facility]". Am. Evlyn Clines. Oncol. 5 (6): 649-55    LABORATORY DATA:  Lab Results  Component Value Date   WBC 8.9 01/02/2023   HGB 14.2 01/02/2023   HCT 43.4 01/02/2023   MCV 95.6 01/02/2023   PLT 232 01/02/2023   Lab Results  Component Value Date   NA 142  01/02/2023   K 5.0 01/02/2023   CL 111 01/02/2023   CO2 27 01/02/2023   Lab Results  Component Value Date   ALT 16 01/02/2023   AST 21 01/02/2023   ALKPHOS 74 01/02/2023   BILITOT 0.5 01/02/2023      RADIOGRAPHY: MM RT BREAST BX W LOC DEV 1ST LESION IMAGE BX SPEC STEREO GUIDE Addendum Date: 12/28/2022 ADDENDUM REPORT: 12/28/2022 13:35 ADDENDUM: Pathology revealed INVASIVE MAMMARY CARCINOMA WITH SIGNET RING MORPHOLOGY of the RIGHT breast, 11 o'clock, 3 cmfn, (ribbon clip). Based on the immunohistochemistry, this likely represents a pleomorphic lobular carcinoma . This was found to be concordant by Dr. Amie Portland. Pathology revealed INVASIVE MAMMARY CARCINOMA WITH SIGNET RING MORPHOLOGY of the RIGHT breast, 12 o'clock, 3 cmfn, (heart clip). Based on the immunohistochemistry, this likely represents a pleomorphic lobular carcinoma. This was found to be concordant by Dr. Amie Portland. Pathology revealed LOBULAR NEOPLASIA, ADENOSIS AND MICROCALCIFICATIONS of the RIGHT breast, 12 o'clock, 4 cmfn, (coil clip). This was found to be concordant by Dr. Amie Portland, with excision recommended. Pathology results were discussed with the patient by telephone. The patient reported doing well after the biopsies with tenderness at the sites. Post biopsy instructions and care were reviewed and questions were answered. The patient was encouraged to call [facility] for any additional concerns. My direct phone number was provided. The patient has a recent diagnosis of LEFT breast cancer and was referred to [facility] on January 02, 2023. Pathology results reported by Rene Kocher, RN on 12/28/2022. Electronically Signed   By: Amie Portland M.D.   On: 12/28/2022 13:35   Result Date: 12/28/2022 CLINICAL DATA:  Patient presents for ultrasound-guided core  needle biopsy of 2 adjacent right breast masses and  stereotactic core needle biopsy of faint calcifications posterior to the dominant right breast mass. EXAM: ULTRASOUND GUIDED RIGHT BREAST CORE NEEDLE BIOPSY: 2 RIGHT BREAST MASSES BIOPSIED. RIGHT BREAST STEREOTACTIC CORE NEEDLE BIOPSY COMPARISON:  Previous exam(s). PROCEDURE: I met with the patient and we discussed the procedure of ultrasound-guided biopsy, including benefits and alternatives. We discussed the high likelihood of a successful procedure. We discussed the risks of the procedure, including infection, bleeding, tissue injury, clip migration, and inadequate sampling. Informed written consent was given. The usual time-out protocol was performed immediately prior to the procedure. Biopsy #1: 2.7 cm mass at 11 o'clock, 3 cm the nipple. Lesion quadrant: Upper outer quadrant Using sterile technique and 1% Lidocaine as local anesthetic, under direct ultrasound visualization, a 12 gauge spring-loaded device was used to perform biopsy of the 2.7 cm mass at 11 o'clock using a lateral approach. At the conclusion of the procedure ribbon shaped tissue marker clip was deployed into the biopsy cavity. Biopsy #2: 6 mm mass at 12 o'clock, 3 cm the nipple. Lesion quadrant: Upper outer quadrant Using sterile technique and 1% Lidocaine as local anesthetic, under direct ultrasound visualization, a 12 gauge spring-loaded device was used to perform biopsy of the small mass at 12 o'clock using a lateral approach. At the conclusion of the procedure heart shaped tissue marker clip was deployed into the biopsy cavity. STEREOTACTIC BIOPSY Biopsy #3: Subtle calcifications posterior to the dominant mass, approximately 11 o'clock, 4 cm the nipple. Using sterile technique and 1% Lidocaine as local anesthetic, under stereotactic guidance, a 9 gauge vacuum assisted device was used to perform core needle biopsy of subtle calcifications in the upper outer quadrant of the right breast using a superior approach. Specimen radiograph was  performed showing a few subtle calcifications. Specimens with calcifications are identified for pathology. Lesion quadrant: Upper outer quadrant At the conclusion of the procedure, a coil shaped tissue marker clip was deployed into the biopsy cavity. Follow up 2 view mammogram was performed and dictated separately. IMPRESSION: Ultrasound guided biopsy of a dominant 2.7 cm mass at 11 o'clock and an adjacent 6 mm satellite mass at 12 o'clock. No apparent complications. Stereotactic-guided biopsy of subtle calcifications approximally 1 cm posterior to the dominant 2.7 cm mass at 11 o'clock. No apparent complications. Electronically Signed: By: Amie Portland M.D. On: 12/26/2022 09:50   [place] RT BREAST BX W LOC DEV 1ST LESION IMG BX SPEC US GUIDE Addendum Date: 12/28/2022 ADDENDUM REPORT: 12/28/2022 13:35 ADDENDUM: Pathology revealed INVASIVE MAMMARY CARCINOMA WITH SIGNET RING MORPHOLOGY of the RIGHT breast, 11 o'clock, 3 cmfn, (ribbon clip). Based on the immunohistochemistry, this likely represents a pleomorphic lobular carcinoma . This was found to be concordant by Dr. Amie Portland. Pathology revealed INVASIVE MAMMARY CARCINOMA WITH SIGNET RING MORPHOLOGY of the RIGHT breast, 12 o'clock, 3 cmfn, (heart clip). Based on the immunohistochemistry, this likely represents a pleomorphic lobular carcinoma. This was found to be concordant by Dr. Amie Portland. Pathology revealed LOBULAR NEOPLASIA, ADENOSIS AND MICROCALCIFICATIONS of the RIGHT breast, 12 o'clock, 4 cmfn, (coil clip). This was found to be concordant by Dr. Amie Portland, with excision recommended. Pathology results were discussed with the patient by telephone. The patient reported doing well after the biopsies with tenderness at the sites. Post biopsy instructions and care were reviewed and questions were answered. The patient was encouraged to call [facility] for any additional concerns. My direct phone number was provided.  The  patient has a recent diagnosis of LEFT breast cancer and was referred to [facility] on January 02, 2023. Pathology results reported by Rene Kocher, RN on 12/28/2022. Electronically Signed   By: Amie Portland M.D.   On: 12/28/2022 13:35   Result Date: 12/28/2022 CLINICAL DATA:  Patient presents for ultrasound-guided core needle biopsy of 2 adjacent right breast masses and stereotactic core needle biopsy of faint calcifications posterior to the dominant right breast mass. EXAM: ULTRASOUND GUIDED RIGHT BREAST CORE NEEDLE BIOPSY: 2 RIGHT BREAST MASSES BIOPSIED. RIGHT BREAST STEREOTACTIC CORE NEEDLE BIOPSY COMPARISON:  Previous exam(s). PROCEDURE: I met with the patient and we discussed the procedure of ultrasound-guided biopsy, including benefits and alternatives. We discussed the high likelihood of a successful procedure. We discussed the risks of the procedure, including infection, bleeding, tissue injury, clip migration, and inadequate sampling. Informed written consent was given. The usual time-out protocol was performed immediately prior to the procedure. Biopsy #1: 2.7 cm mass at 11 o'clock, 3 cm the nipple. Lesion quadrant: Upper outer quadrant Using sterile technique and 1% Lidocaine as local anesthetic, under direct ultrasound visualization, a 12 gauge spring-loaded device was used to perform biopsy of the 2.7 cm mass at 11 o'clock using a lateral approach. At the conclusion of the procedure ribbon shaped tissue marker clip was deployed into the biopsy cavity. Biopsy #2: 6 mm mass at 12 o'clock, 3 cm the nipple. Lesion quadrant: Upper outer quadrant Using sterile technique and 1% Lidocaine as local anesthetic, under direct ultrasound visualization, a 12 gauge spring-loaded device was used to perform biopsy of the small mass at 12 o'clock using a lateral approach. At the conclusion of the procedure heart shaped tissue marker clip  was deployed into the biopsy cavity. STEREOTACTIC BIOPSY Biopsy #3: Subtle calcifications posterior to the dominant mass, approximately 11 o'clock, 4 cm the nipple. Using sterile technique and 1% Lidocaine as local anesthetic, under stereotactic guidance, a 9 gauge vacuum assisted device was used to perform core needle biopsy of subtle calcifications in the upper outer quadrant of the right breast using a superior approach. Specimen radiograph was performed showing a few subtle calcifications. Specimens with calcifications are identified for pathology. Lesion quadrant: Upper outer quadrant At the conclusion of the procedure, a coil shaped tissue marker clip was deployed into the biopsy cavity. Follow up 2 view mammogram was performed and dictated separately. IMPRESSION: Ultrasound guided biopsy of a dominant 2.7 cm mass at 11 o'clock and an adjacent 6 mm satellite mass at 12 o'clock. No apparent complications. Stereotactic-guided biopsy of subtle calcifications approximally 1 cm posterior to the dominant 2.7 cm mass at 11 o'clock. No apparent complications. Electronically Signed: By: Amie Portland M.D. On: 12/26/2022 09:50   [place] RT BREAST BX W LOC DEV EA ADD LESION IMG BX SPEC US GUIDE Addendum Date: 12/28/2022 ADDENDUM REPORT: 12/28/2022 13:35 ADDENDUM: Pathology revealed INVASIVE MAMMARY CARCINOMA WITH SIGNET RING MORPHOLOGY of the RIGHT breast, 11 o'clock, 3 cmfn, (ribbon clip). Based on the immunohistochemistry, this likely represents a pleomorphic lobular carcinoma . This was found to be concordant by Dr. Amie Portland. Pathology revealed INVASIVE MAMMARY CARCINOMA WITH SIGNET RING MORPHOLOGY of the RIGHT breast, 12 o'clock, 3 cmfn, (heart clip). Based on the immunohistochemistry, this likely represents a pleomorphic lobular carcinoma. This was found to be concordant by Dr. Amie Portland. Pathology revealed LOBULAR NEOPLASIA, ADENOSIS AND MICROCALCIFICATIONS of the RIGHT breast, 12 o'clock, 4 cmfn, (coil  clip).  This was found to be concordant by Dr. Amie Portland, with excision recommended. Pathology results were discussed with the patient by telephone. The patient reported doing well after the biopsies with tenderness at the sites. Post biopsy instructions and care were reviewed and questions were answered. The patient was encouraged to call [facility] for any additional concerns. My direct phone number was provided. The patient has a recent diagnosis of LEFT breast cancer and was referred to [facility] on January 02, 2023. Pathology results reported by Rene Kocher, RN on 12/28/2022. Electronically Signed   By: Amie Portland M.D.   On: 12/28/2022 13:35   Result Date: 12/28/2022 CLINICAL DATA:  Patient presents for ultrasound-guided core needle biopsy of 2 adjacent right breast masses and stereotactic core needle biopsy of faint calcifications posterior to the dominant right breast mass. EXAM: ULTRASOUND GUIDED RIGHT BREAST CORE NEEDLE BIOPSY: 2 RIGHT BREAST MASSES BIOPSIED. RIGHT BREAST STEREOTACTIC CORE NEEDLE BIOPSY COMPARISON:  Previous exam(s). PROCEDURE: I met with the patient and we discussed the procedure of ultrasound-guided biopsy, including benefits and alternatives. We discussed the high likelihood of a successful procedure. We discussed the risks of the procedure, including infection, bleeding, tissue injury, clip migration, and inadequate sampling. Informed written consent was given. The usual time-out protocol was performed immediately prior to the procedure. Biopsy #1: 2.7 cm mass at 11 o'clock, 3 cm the nipple. Lesion quadrant: Upper outer quadrant Using sterile technique and 1% Lidocaine as local anesthetic, under direct ultrasound visualization, a 12 gauge spring-loaded device was used to perform biopsy of the 2.7 cm mass at 11 o'clock using a lateral approach. At the conclusion of the  procedure ribbon shaped tissue marker clip was deployed into the biopsy cavity. Biopsy #2: 6 mm mass at 12 o'clock, 3 cm the nipple. Lesion quadrant: Upper outer quadrant Using sterile technique and 1% Lidocaine as local anesthetic, under direct ultrasound visualization, a 12 gauge spring-loaded device was used to perform biopsy of the small mass at 12 o'clock using a lateral approach. At the conclusion of the procedure heart shaped tissue marker clip was deployed into the biopsy cavity. STEREOTACTIC BIOPSY Biopsy #3: Subtle calcifications posterior to the dominant mass, approximately 11 o'clock, 4 cm the nipple. Using sterile technique and 1% Lidocaine as local anesthetic, under stereotactic guidance, a 9 gauge vacuum assisted device was used to perform core needle biopsy of subtle calcifications in the upper outer quadrant of the right breast using a superior approach. Specimen radiograph was performed showing a few subtle calcifications. Specimens with calcifications are identified for pathology. Lesion quadrant: Upper outer quadrant At the conclusion of the procedure, a coil shaped tissue marker clip was deployed into the biopsy cavity. Follow up 2 view mammogram was performed and dictated separately. IMPRESSION: Ultrasound guided biopsy of a dominant 2.7 cm mass at 11 o'clock and an adjacent 6 mm satellite mass at 12 o'clock. No apparent complications. Stereotactic-guided biopsy of subtle calcifications approximally 1 cm posterior to the dominant 2.7 cm mass at 11 o'clock. No apparent complications. Electronically Signed: By: Amie Portland M.D. On: 12/26/2022 09:50   MM CLIP PLACEMENT RIGHT Result Date: 12/26/2022 CLINICAL DATA:  Assess post biopsy marker clip placements following 2 ultrasound-guided core needle biopsies and a stereotactic core needle biopsy of 2 adjacent right breast masses and adjacent right breast calcifications. EXAM: 3D DIAGNOSTIC RIGHT MAMMOGRAM POST ULTRASOUND AND STEREOTACTIC BIOPSY  COMPARISON:  Previous exam(s). FINDINGS:  3D Mammographic images were obtained following ultrasound-guided and stereotactic guided biopsy of 2 adjacent right breast masses as well as adjacent right breast calcifications. The ribbon clip lies in the dominant mass in the upper outer quadrant, the heart shaped clip lies medial to this within the expected location of the small satellite mass and the coil shaped post biopsy clip lies posterior and slightly lateral to the dominant mass in the expected location of the subtle adjacent calcifications. IMPRESSION: Appropriate positioning of the ribbon, heart and coil shaped biopsy marking clips at the site of biopsy in the right breast as detailed above. Final Assessment: Post Procedure Mammograms for Marker Placement Electronically Signed   By: Amie Portland M.D.   On: 12/26/2022 10:04   [place] LT BREAST BX W LOC DEV 1ST LESION IMG BX SPEC US GUIDE Addendum Date: 12/25/2022 ADDENDUM REPORT: 12/25/2022 08:11 ADDENDUM: Pathology revealed GRADE III INVASIVE DUCTAL CARCINOMA, DUCTAL CARCINOMA IN SITU, [place], HIGH NUCLEAR GRADE, WITH NECROSIS, LYMPHOVASCULAR INVASION: PRESENT, CALCIFICATIONS: PRESENT of the LEFT breast, 3:00, 8 cmfn, (ribbon clip). This was found to be concordant by Dr. Frederico Hamman. Pathology revealed BENIGN BREAST TISSUE WITH STROMAL FIBROSIS of the LEFT breast, 2:00, 8 cmfn, (heart clip). This was found to be discordant by Dr. Frederico Hamman, with excision recommended. 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 This was found to be concordant by Dr. Frederico Hamman. Pathology results were discussed with the patient by telephone. The patient reported doing well after the biopsies with tenderness at the sites. Post biopsy instructions and care were reviewed and questions were answered. The patient was encouraged to call [facility] for any additional concerns. My direct  phone number was provided. The patient was referred to [facility] on January 02, 2023. NOTE: The patient is scheduled for a RIGHT breast stereotactic guided biopsy x 1 and ultrasound guided biopsies x 2 on December 26, 2022. Further recommendations will be guided by the results of these biopsies. Pathology results reported by Rene Kocher, RN on 12/24/2022. Electronically Signed   By: Frederico Hamman M.D.   On: 12/25/2022 08:11   Result Date: 12/25/2022 CLINICAL DATA:  72 year old female presenting for ultrasound-guided biopsy of 2 masses in the left breast and a left axillary lymph node. EXAM: ULTRASOUND GUIDED LEFT BREAST CORE NEEDLE BIOPSY COMPARISON:  Previous exam(s). PROCEDURE: I met with the patient and we discussed the procedure of ultrasound-guided biopsy, including benefits and alternatives. We discussed the high likelihood of a successful procedure. We discussed the risks of the procedure, including infection, bleeding, tissue injury, clip migration, and inadequate sampling. Informed written consent was given. The usual time-out protocol was performed immediately prior to the procedure. Lesion quadrant: Upper outer quadrant Using sterile technique and 1% Lidocaine as local anesthetic, under direct ultrasound visualization, a 14 gauge spring-loaded device was used to perform biopsy of a left breast mass at 3 o'clock, 8 cm from the nipple using a lateral approach. At the conclusion of the procedure the ribbon shaped tissue marker clip was deployed into the biopsy cavity. Lesion quadrant: Upper outer quadrant Using sterile technique and 1% Lidocaine as local anesthetic, under direct ultrasound visualization, a 14 gauge spring-loaded device was used to perform biopsy of a left breast mass at 2 o'clock, 8 cm from the nipple using a lateral approach. At the conclusion of the procedure a heart shaped tissue marker clip was  deployed  into the biopsy cavity. Lesion quadrant: Left axilla Using sterile technique and 1% Lidocaine as local anesthetic, under direct ultrasound visualization, a 14 gauge spring-loaded device was used to perform biopsy of a thickened left axillary lymph node using a lateral approach. At the conclusion of the procedure spiral shaped HydroMARK clip was deployed into the biopsy cavity. Follow up 2 view mammogram was performed and dictated separately. IMPRESSION: 1. Ultrasound guided biopsy of a left breast mass at 3 o'clock (ribbon clip). No apparent complications. 2. Ultrasound guided biopsy of a left breast mass at 2 o'clock (heart clip). No apparent complications. 3. Ultrasound guided biopsy of a left axillary lymph node (spiral HydroMARK clip). No apparent complications. Electronically Signed: By: Frederico Hamman M.D. On: 12/21/2022 15:28   [place] LT BREAST BX W LOC DEV EA ADD LESION IMG BX SPEC US GUIDE Addendum Date: 12/25/2022 ADDENDUM REPORT: 12/25/2022 08:11 ADDENDUM: Pathology revealed GRADE III INVASIVE DUCTAL CARCINOMA, DUCTAL CARCINOMA IN SITU, COMEDO, HIGH NUCLEAR GRADE, WITH NECROSIS, LYMPHOVASCULAR INVASION: PRESENT, CALCIFICATIONS: PRESENT of the LEFT breast, 3:00, 8 cmfn, (ribbon clip). This was found to be concordant by Dr. Frederico Hamman. Pathology revealed BENIGN BREAST TISSUE WITH STROMAL FIBROSIS of the LEFT breast, 2:00, 8 cmfn, (heart clip). This was found to be discordant by Dr. Frederico Hamman, with excision recommended. 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 This was found to be concordant by Dr. Frederico Hamman. Pathology results were discussed with the patient by telephone. The patient reported doing well after the biopsies with tenderness at the sites. Post biopsy instructions and care were reviewed and questions were answered. The patient was encouraged to call [facility] for any additional  concerns. My direct phone number was provided. The patient was referred to [facility] on January 02, 2023. NOTE: The patient is scheduled for a RIGHT breast stereotactic guided biopsy x 1 and ultrasound guided biopsies x 2 on December 26, 2022. Further recommendations will be guided by the results of these biopsies. Pathology results reported by Rene Kocher, RN on 12/24/2022. Electronically Signed   By: Frederico Hamman M.D.   On: 12/25/2022 08:11   Result Date: 12/25/2022 CLINICAL DATA:  72 year old female presenting for ultrasound-guided biopsy of 2 masses in the left breast and a left axillary lymph node. EXAM: ULTRASOUND GUIDED LEFT BREAST CORE NEEDLE BIOPSY COMPARISON:  Previous exam(s). PROCEDURE: I met with the patient and we discussed the procedure of ultrasound-guided biopsy, including benefits and alternatives. We discussed the high likelihood of a successful procedure. We discussed the risks of the procedure, including infection, bleeding, tissue injury, clip migration, and inadequate sampling. Informed written consent was given. The usual time-out protocol was performed immediately prior to the procedure. Lesion quadrant: Upper outer quadrant Using sterile technique and 1% Lidocaine as local anesthetic, under direct ultrasound visualization, a 14 gauge spring-loaded device was used to perform biopsy of a left breast mass at 3 o'clock, 8 cm from the nipple using a lateral approach. At the conclusion of the procedure the ribbon shaped tissue marker clip was deployed into the biopsy cavity. Lesion quadrant: Upper outer quadrant Using sterile technique and 1% Lidocaine as local anesthetic, under direct ultrasound visualization, a 14 gauge spring-loaded device was used to perform biopsy of a left breast mass at 2 o'clock, 8 cm from the nipple using a lateral approach. At the conclusion of the procedure a  heart shaped tissue  marker clip was deployed into the biopsy cavity. Lesion quadrant: Left axilla Using sterile technique and 1% Lidocaine as local anesthetic, under direct ultrasound visualization, a 14 gauge spring-loaded device was used to perform biopsy of a thickened left axillary lymph node using a lateral approach. At the conclusion of the procedure spiral shaped HydroMARK clip was deployed into the biopsy cavity. Follow up 2 view mammogram was performed and dictated separately. IMPRESSION: 1. Ultrasound guided biopsy of a left breast mass at 3 o'clock (ribbon clip). No apparent complications. 2. Ultrasound guided biopsy of a left breast mass at 2 o'clock (heart clip). No apparent complications. 3. Ultrasound guided biopsy of a left axillary lymph node (spiral HydroMARK clip). No apparent complications. Electronically Signed: By: Frederico Hamman M.D. On: 12/21/2022 15:28   [place] AXILLARY NODE CORE BIOPSY LEFT Addendum Date: 12/25/2022 ADDENDUM REPORT: 12/25/2022 08:11 ADDENDUM: Pathology revealed GRADE III INVASIVE DUCTAL CARCINOMA, DUCTAL CARCINOMA IN SITU, COMEDO, HIGH NUCLEAR GRADE, WITH NECROSIS, LYMPHOVASCULAR INVASION: PRESENT, CALCIFICATIONS: PRESENT of the LEFT breast, 3:00, 8 cmfn, (ribbon clip). This was found to be concordant by Dr. Frederico Hamman. Pathology revealed BENIGN BREAST TISSUE WITH STROMAL FIBROSIS of the LEFT breast, 2:00, 8 cmfn, (heart clip). This was found to be discordant by Dr. Frederico Hamman, with excision recommended. 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 This was found to be concordant by Dr. Frederico Hamman. Pathology results were discussed with the patient by telephone. The patient reported doing well after the biopsies with tenderness at the sites. Post biopsy instructions and care were reviewed and questions were answered. The patient was encouraged to call [facility] for any additional concerns.  My direct phone number was provided. The patient was referred to [facility] on January 02, 2023. NOTE: The patient is scheduled for a RIGHT breast stereotactic guided biopsy x 1 and ultrasound guided biopsies x 2 on December 26, 2022. Further recommendations will be guided by the results of these biopsies. Pathology results reported by Rene Kocher, RN on 12/24/2022. Electronically Signed   By: Frederico Hamman M.D.   On: 12/25/2022 08:11   Result Date: 12/25/2022 CLINICAL DATA:  72 year old female presenting for ultrasound-guided biopsy of 2 masses in the left breast and a left axillary lymph node. EXAM: ULTRASOUND GUIDED LEFT BREAST CORE NEEDLE BIOPSY COMPARISON:  Previous exam(s). PROCEDURE: I met with the patient and we discussed the procedure of ultrasound-guided biopsy, including benefits and alternatives. We discussed the high likelihood of a successful procedure. We discussed the risks of the procedure, including infection, bleeding, tissue injury, clip migration, and inadequate sampling. Informed written consent was given. The usual time-out protocol was performed immediately prior to the procedure. Lesion quadrant: Upper outer quadrant Using sterile technique and 1% Lidocaine as local anesthetic, under direct ultrasound visualization, a 14 gauge spring-loaded device was used to perform biopsy of a left breast mass at 3 o'clock, 8 cm from the nipple using a lateral approach. At the conclusion of the procedure the ribbon shaped tissue marker clip was deployed into the biopsy cavity. Lesion quadrant: Upper outer quadrant Using sterile technique and 1% Lidocaine as local anesthetic, under direct ultrasound visualization, a 14 gauge spring-loaded device was used to perform biopsy of a left breast mass at 2 o'clock, 8 cm from the nipple using a lateral approach. At the conclusion of the procedure a heart shaped tissue  marker clip  was deployed into the biopsy cavity. Lesion quadrant: Left axilla Using sterile technique and 1% Lidocaine as local anesthetic, under direct ultrasound visualization, a 14 gauge spring-loaded device was used to perform biopsy of a thickened left axillary lymph node using a lateral approach. At the conclusion of the procedure spiral shaped HydroMARK clip was deployed into the biopsy cavity. Follow up 2 view mammogram was performed and dictated separately. IMPRESSION: 1. Ultrasound guided biopsy of a left breast mass at 3 o'clock (ribbon clip). No apparent complications. 2. Ultrasound guided biopsy of a left breast mass at 2 o'clock (heart clip). No apparent complications. 3. Ultrasound guided biopsy of a left axillary lymph node (spiral HydroMARK clip). No apparent complications. Electronically Signed: By: Frederico Hamman M.D. On: 12/21/2022 15:28   MM CLIP PLACEMENT LEFT Result Date: 12/24/2022 CLINICAL DATA:  Post biopsy mammogram of the left breast for clip placement. EXAM: 3D DIAGNOSTIC LEFT MAMMOGRAM POST ULTRASOUND BIOPSY COMPARISON:  Previous exam(s). FINDINGS: 3D Mammographic images were obtained following ultrasound guided biopsy of 2 masses in the left breast and a left axillary lymph node. The biopsy marking clips within the breast are in expected position at the site of biopsy. The left axillary clip could not be visualized within the field of view due to its deep positioning. IMPRESSION: 1. Appropriate positioning of the ribbon and heart shaped biopsy marking clips at the sites of biopsy in the lateral left breast. 2. The clip in the left axilla could not be seen within the field of view due to its deep positioning and the patient's limited mobility. Final Assessment: Post Procedure Mammograms for Marker Placement Electronically Signed   By: Frederico Hamman M.D.   On: 12/24/2022 07:41   MM 3D DIAGNOSTIC MAMMOGRAM BILATERAL BREAST Result Date: 12/20/2022 CLINICAL DATA:  72 year old female  presenting as a recall from baseline screening for bilateral breast masses. EXAM: DIGITAL DIAGNOSTIC BILATERAL MAMMOGRAM WITH TOMOSYNTHESIS AND CAD; ULTRASOUND RIGHT BREAST LIMITED; ULTRASOUND LEFT BREAST LIMITED TECHNIQUE: Bilateral digital diagnostic mammography and breast tomosynthesis was performed. The images were evaluated with computer-aided detection. ; Targeted ultrasound examination of the right breast was performed; Targeted ultrasound examination of the left breast was performed. COMPARISON:  Previous exam(s). ACR Breast Density Category b: There are scattered areas of fibroglandular density. FINDINGS: Mammogram: Right breast: Spot compression tomosynthesis views of the right breast were performed demonstrating persistence of an irregular mass with distortion and associated calcifications in the upper outer right breast spanning approximately 1.9 cm. Additionally there are faint calcifications on the spot 2D magnification views located approximately 8 mm posterior to the dominant mass. Left breast: Spot compression tomosynthesis views demonstrate persistence of an irregular mass with distortion that appears to be tethering the skin measuring 2.2 cm with associated suspicious calcifications. Ultrasound: Right breast: Targeted ultrasound is performed in the right breast at 11 o'clock 3 cm from the nipple demonstrating an irregular hypoechoic mass measuring 2.7 x 2.3 x 1.8 cm. There is a small satellite nodule at 12 o'clock 3 cm from the nipple measuring 0.5 x 0.4 x 0.8 cm. Targeted ultrasound of the right axilla demonstrates normal lymph nodes. Left breast: Targeted ultrasound performed in the left breast at 3 o'clock 8 cm from the nipple measuring 3.2 x 2.0 x 2.2 cm. The mass appears to extend into the skin. There is a small nearby area of distortion at 2 o'clock 8 cm from the nipple measuring 0.6 x 0.9 x 0.6 cm. Targeted ultrasound of the left axilla demonstrates  1 abnormal lymph node with cortical  thickness measuring 0.5 cm. IMPRESSION: 1. Highly suspicious mass in the right breast at 11 o'clock measuring 2.7 cm. 2. Small satellite nodule at 12 o'clock measuring 0.8 cm. 3. Indeterminate calcifications located approximately 8 mm posterior to the dominant mass. 4. Highly suspicious mass in the left breast at 3 o'clock measuring 3.2 cm. The mass appears to extend within the skin. 5. Indeterminate small nearby area of distortion in the left breast at 2 o'clock measuring 0.9 cm. 6. Single abnormal left axillary lymph node. RECOMMENDATION: 1.  Ultrasound-guided core needle biopsy x2 of the right breast. 2.  Stereotactic core needle biopsy x1 of the right breast. 3.  Ultrasound-guided core needle biopsy x2 of the left breast. 4.  Ultrasound-guided core needle biopsy x1 of the left axilla. I have discussed the findings and recommendations with the patient. If applicable, a reminder letter will be sent to the patient regarding the next appointment. BI-RADS CATEGORY  5: Highly suggestive of malignancy. Electronically Signed   By: Emmaline Kluver M.D.   On: 12/20/2022 11:48   [place] LIMITED ULTRASOUND INCLUDING AXILLA LEFT BREAST  Result Date: 12/20/2022 CLINICAL DATA:  72 year old female presenting as a recall from baseline screening for bilateral breast masses. EXAM: DIGITAL DIAGNOSTIC BILATERAL MAMMOGRAM WITH TOMOSYNTHESIS AND CAD; ULTRASOUND RIGHT BREAST LIMITED; ULTRASOUND LEFT BREAST LIMITED TECHNIQUE: Bilateral digital diagnostic mammography and breast tomosynthesis was performed. The images were evaluated with computer-aided detection. ; Targeted ultrasound examination of the right breast was performed; Targeted ultrasound examination of the left breast was performed. COMPARISON:  Previous exam(s). ACR Breast Density Category b: There are scattered areas of fibroglandular density. FINDINGS: Mammogram: Right breast: Spot compression tomosynthesis views of the right breast were performed demonstrating persistence  of an irregular mass with distortion and associated calcifications in the upper outer right breast spanning approximately 1.9 cm. Additionally there are faint calcifications on the spot 2D magnification views located approximately 8 mm posterior to the dominant mass. Left breast: Spot compression tomosynthesis views demonstrate persistence of an irregular mass with distortion that appears to be tethering the skin measuring 2.2 cm with associated suspicious calcifications. Ultrasound: Right breast: Targeted ultrasound is performed in the right breast at 11 o'clock 3 cm from the nipple demonstrating an irregular hypoechoic mass measuring 2.7 x 2.3 x 1.8 cm. There is a small satellite nodule at 12 o'clock 3 cm from the nipple measuring 0.5 x 0.4 x 0.8 cm. Targeted ultrasound of the right axilla demonstrates normal lymph nodes. Left breast: Targeted ultrasound performed in the left breast at 3 o'clock 8 cm from the nipple measuring 3.2 x 2.0 x 2.2 cm. The mass appears to extend into the skin. There is a small nearby area of distortion at 2 o'clock 8 cm from the nipple measuring 0.6 x 0.9 x 0.6 cm. Targeted ultrasound of the left axilla demonstrates 1 abnormal lymph node with cortical thickness measuring 0.5 cm. IMPRESSION: 1. Highly suspicious mass in the right breast at 11 o'clock measuring 2.7 cm. 2. Small satellite nodule at 12 o'clock measuring 0.8 cm. 3. Indeterminate calcifications located approximately 8 mm posterior to the dominant mass. 4. Highly suspicious mass in the left breast at 3 o'clock measuring 3.2 cm. The mass appears to extend within the skin. 5. Indeterminate small nearby area of distortion in the left breast at 2 o'clock measuring 0.9 cm. 6. Single abnormal left axillary lymph node. RECOMMENDATION: 1.  Ultrasound-guided core needle biopsy x2 of the right breast. 2.  Stereotactic  core needle biopsy x1 of the right breast. 3.  Ultrasound-guided core needle biopsy x2 of the left breast. 4.   Ultrasound-guided core needle biopsy x1 of the left axilla. I have discussed the findings and recommendations with the patient. If applicable, a reminder letter will be sent to the patient regarding the next appointment. BI-RADS CATEGORY  5: Highly suggestive of malignancy. Electronically Signed   By: Emmaline Kluver M.D.   On: 12/20/2022 11:48   [place] LIMITED ULTRASOUND INCLUDING AXILLA RIGHT BREAST Result Date: 12/20/2022 CLINICAL DATA:  72 year old female presenting as a recall from baseline screening for bilateral breast masses. EXAM: DIGITAL DIAGNOSTIC BILATERAL MAMMOGRAM WITH TOMOSYNTHESIS AND CAD; ULTRASOUND RIGHT BREAST LIMITED; ULTRASOUND LEFT BREAST LIMITED TECHNIQUE: Bilateral digital diagnostic mammography and breast tomosynthesis was performed. The images were evaluated with computer-aided detection. ; Targeted ultrasound examination of the right breast was performed; Targeted ultrasound examination of the left breast was performed. COMPARISON:  Previous exam(s). ACR Breast Density Category b: There are scattered areas of fibroglandular density. FINDINGS: Mammogram: Right breast: Spot compression tomosynthesis views of the right breast were performed demonstrating persistence of an irregular mass with distortion and associated calcifications in the upper outer right breast spanning approximately 1.9 cm. Additionally there are faint calcifications on the spot 2D magnification views located approximately 8 mm posterior to the dominant mass. Left breast: Spot compression tomosynthesis views demonstrate persistence of an irregular mass with distortion that appears to be tethering the skin measuring 2.2 cm with associated suspicious calcifications. Ultrasound: Right breast: Targeted ultrasound is performed in the right breast at 11 o'clock 3 cm from the nipple demonstrating an irregular hypoechoic mass measuring 2.7 x 2.3 x 1.8 cm. There is a small satellite nodule at 12 o'clock 3 cm from the nipple  measuring 0.5 x 0.4 x 0.8 cm. Targeted ultrasound of the right axilla demonstrates normal lymph nodes. Left breast: Targeted ultrasound performed in the left breast at 3 o'clock 8 cm from the nipple measuring 3.2 x 2.0 x 2.2 cm. The mass appears to extend into the skin. There is a small nearby area of distortion at 2 o'clock 8 cm from the nipple measuring 0.6 x 0.9 x 0.6 cm. Targeted ultrasound of the left axilla demonstrates 1 abnormal lymph node with cortical thickness measuring 0.5 cm. IMPRESSION: 1. Highly suspicious mass in the right breast at 11 o'clock measuring 2.7 cm. 2. Small satellite nodule at 12 o'clock measuring 0.8 cm. 3. Indeterminate calcifications located approximately 8 mm posterior to the dominant mass. 4. Highly suspicious mass in the left breast at 3 o'clock measuring 3.2 cm. The mass appears to extend within the skin. 5. Indeterminate small nearby area of distortion in the left breast at 2 o'clock measuring 0.9 cm. 6. Single abnormal left axillary lymph node. RECOMMENDATION: 1.  Ultrasound-guided core needle biopsy x2 of the right breast. 2.  Stereotactic core needle biopsy x1 of the right breast. 3.  Ultrasound-guided core needle biopsy x2 of the left breast. 4.  Ultrasound-guided core needle biopsy x1 of the left axilla. I have discussed the findings and recommendations with the patient. If applicable, a reminder letter will be sent to the patient regarding the next appointment. BI-RADS CATEGORY  5: Highly suggestive of malignancy. Electronically Signed   By: Emmaline Kluver M.D.   On: 12/20/2022 11:48       IMPRESSION/PLAN: 1. 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 Dr. Mitzi Hansen discusses the pathology findings and reviews the nature of locally advanced bilateral breast disease. The consensus from the breast conference includes  proceeding with neoadjuvant chemotherapy followed by breast conservation surgery bilaterally with sentinel node biopsy on the right, and targeted node dissection on the left. Dr. Mitzi Hansen recommends external radiotherapy to bilateral breasts and left regional nodes  to reduce risks of local recurrence. Dr. Pamelia Hoit anticipates adjuvant antiestrogen therapy to follow. We discussed the risks, benefits, short, and long term effects of radiotherapy, as well as the curative intent, and the patient is interested in proceeding. Dr. Mitzi Hansen discusses the delivery and logistics of radiotherapy and anticipates a course of 6 1/2 weeks of radiotherapy to bilateral breasts and left regional nodes. We will see her back a few weeks after surgery to discuss the simulation process and anticipate we starting radiotherapy about 4-6 weeks after surgery.  2. Possible genetic predisposition to malignancy. The patient is a candidate for genetic testing given her personal and family history. She will meet with our geneticist today in clinic.   In a visit lasting 60 minutes, greater than 50% of the time was spent face to face reviewing her case, as well as in preparation of, discussing, and coordinating the patient's care.  The above documentation reflects my direct findings during this shared patient visit. Please see the separate note by Dr. Mitzi Hansen on this date for the remainder of the patient's plan of care.    Osker Mason, [facility]    **Disclaimer: This note was dictated with voice recognition software. Similar sounding words can inadvertently be transcribed and this note may contain transcription errors which may not have been corrected upon publication of note.**

## 2023-01-02 ENCOUNTER — Encounter: Payer: Self-pay | Admitting: Physical Therapy

## 2023-01-02 ENCOUNTER — Inpatient Hospital Stay (HOSPITAL_BASED_OUTPATIENT_CLINIC_OR_DEPARTMENT_OTHER): Payer: 59 | Admitting: Genetic Counselor

## 2023-01-02 ENCOUNTER — Encounter: Payer: Self-pay | Admitting: Hematology and Oncology

## 2023-01-02 ENCOUNTER — Ambulatory Visit
Admission: RE | Admit: 2023-01-02 | Discharge: 2023-01-02 | Disposition: A | Discharge status: Home / Self Care | Payer: 59 | Source: Ambulatory Visit | Attending: Radiation Oncology | Admitting: Radiation Oncology

## 2023-01-02 ENCOUNTER — Other Ambulatory Visit: Payer: Self-pay

## 2023-01-02 ENCOUNTER — Encounter: Payer: Self-pay | Admitting: *Deleted

## 2023-01-02 ENCOUNTER — Inpatient Hospital Stay (HOSPITAL_BASED_OUTPATIENT_CLINIC_OR_DEPARTMENT_OTHER): Payer: 59 | Admitting: Hematology and Oncology

## 2023-01-02 ENCOUNTER — Inpatient Hospital Stay: Payer: 59 | Attending: Hematology and Oncology

## 2023-01-02 ENCOUNTER — Ambulatory Visit: Payer: 59 | Attending: General Surgery | Admitting: Physical Therapy

## 2023-01-02 VITALS — BP 149/66 | HR 74 | Temp 98.0°F | Resp 18 | Ht 69.0 in | Wt 169.4 lb

## 2023-01-02 DIAGNOSIS — C50411 Malignant neoplasm of upper-outer quadrant of right female breast: Secondary | ICD-10-CM | POA: Insufficient documentation

## 2023-01-02 DIAGNOSIS — R2681 Unsteadiness on feet: Secondary | ICD-10-CM | POA: Diagnosis present

## 2023-01-02 DIAGNOSIS — Z170 Estrogen receptor positive status [ER+]: Secondary | ICD-10-CM | POA: Insufficient documentation

## 2023-01-02 DIAGNOSIS — Z171 Estrogen receptor negative status [ER-]: Secondary | ICD-10-CM | POA: Insufficient documentation

## 2023-01-02 DIAGNOSIS — C50811 Malignant neoplasm of overlapping sites of right female breast: Secondary | ICD-10-CM | POA: Insufficient documentation

## 2023-01-02 DIAGNOSIS — I69354 Hemiplegia and hemiparesis following cerebral infarction affecting left non-dominant side: Secondary | ICD-10-CM | POA: Insufficient documentation

## 2023-01-02 DIAGNOSIS — Z7982 Long term (current) use of aspirin: Secondary | ICD-10-CM | POA: Diagnosis not present

## 2023-01-02 DIAGNOSIS — Z79899 Other long term (current) drug therapy: Secondary | ICD-10-CM | POA: Insufficient documentation

## 2023-01-02 DIAGNOSIS — C50412 Malignant neoplasm of upper-outer quadrant of left female breast: Secondary | ICD-10-CM

## 2023-01-02 DIAGNOSIS — R262 Difficulty in walking, not elsewhere classified: Secondary | ICD-10-CM | POA: Insufficient documentation

## 2023-01-02 DIAGNOSIS — Z800 Family history of malignant neoplasm of digestive organs: Secondary | ICD-10-CM

## 2023-01-02 DIAGNOSIS — Z803 Family history of malignant neoplasm of breast: Secondary | ICD-10-CM

## 2023-01-02 DIAGNOSIS — R293 Abnormal posture: Secondary | ICD-10-CM | POA: Diagnosis present

## 2023-01-02 DIAGNOSIS — Z8041 Family history of malignant neoplasm of ovary: Secondary | ICD-10-CM

## 2023-01-02 DIAGNOSIS — Z7902 Long term (current) use of antithrombotics/antiplatelets: Secondary | ICD-10-CM | POA: Insufficient documentation

## 2023-01-02 DIAGNOSIS — F17210 Nicotine dependence, cigarettes, uncomplicated: Secondary | ICD-10-CM | POA: Diagnosis not present

## 2023-01-02 LAB — CBC WITH DIFFERENTIAL (CANCER CENTER ONLY)
Abs Immature Granulocytes: 0.02 10*3/uL (ref 0.00–0.07)
Basophils Absolute: 0.1 10*3/uL (ref 0.0–0.1)
Basophils Relative: 1 %
Eosinophils Absolute: 0.2 10*3/uL (ref 0.0–0.5)
Eosinophils Relative: 3 %
HCT: 43.4 % (ref 36.0–46.0)
Hemoglobin: 14.2 g/dL (ref 12.0–15.0)
Immature Granulocytes: 0 %
Lymphocytes Relative: 29 %
Lymphs Abs: 2.6 10*3/uL (ref 0.7–4.0)
MCH: 31.3 pg (ref 26.0–34.0)
MCHC: 32.7 g/dL (ref 30.0–36.0)
MCV: 95.6 fL (ref 80.0–100.0)
Monocytes Absolute: 0.4 10*3/uL (ref 0.1–1.0)
Monocytes Relative: 5 %
Neutro Abs: 5.5 10*3/uL (ref 1.7–7.7)
Neutrophils Relative %: 62 %
Platelet Count: 232 10*3/uL (ref 150–400)
RBC: 4.54 MIL/uL (ref 3.87–5.11)
RDW: 13.5 % (ref 11.5–15.5)
WBC Count: 8.9 10*3/uL (ref 4.0–10.5)
nRBC: 0 % (ref 0.0–0.2)

## 2023-01-02 LAB — CMP (CANCER CENTER ONLY)
ALT: 16 U/L (ref 0–44)
AST: 21 U/L (ref 15–41)
Albumin: 3.8 g/dL (ref 3.5–5.0)
Alkaline Phosphatase: 74 U/L (ref 38–126)
Anion gap: 4 — ABNORMAL LOW (ref 5–15)
BUN: 12 mg/dL (ref 8–23)
CO2: 27 mmol/L (ref 22–32)
Calcium: 9.7 mg/dL (ref 8.9–10.3)
Chloride: 111 mmol/L (ref 98–111)
Creatinine: 0.89 mg/dL (ref 0.44–1.00)
GFR, Estimated: >60 mL/min (ref >60)
Glucose, Bld: 83 mg/dL (ref 70–99)
Potassium: 5 mmol/L (ref 3.5–5.1)
Sodium: 142 mmol/L (ref 135–145)
Total Bilirubin: 0.5 mg/dL (ref <1.2)
Total Protein: 5.8 g/dL — ABNORMAL LOW (ref 6.5–8.1)

## 2023-01-02 LAB — GENETIC SCREENING ORDER

## 2023-01-02 NOTE — Therapy (Signed)
OUTPATIENT PHYSICAL THERAPY BREAST CANCER BASELINE EVALUATION   Patient Name: Donna [name] MRN: [id number] DOB:06/26/1950, 72 y.o., female Today's Date: 01/02/2023  END OF SESSION:  PT End of Session - 01/02/23 1452     Visit Number 1    Number of Visits 2    Date for PT Re-Evaluation 07/03/23    PT Start Time 1311    PT Stop Time 1336    PT Time Calculation (min) 25 min    Activity Tolerance Patient tolerated treatment well    Behavior During Therapy [facility] for tasks assessed/performed             Past Medical History:  Diagnosis Date   Allergy    Anxiety    Breast cancer (HCC)    Depression    Hyperlipidemia    no meds    Hypertension    Past Surgical History:  Procedure Laterality Date   BREAST BIOPSY Left 12/21/2022   [place] LT BREAST BX W LOC DEV 1ST LESION IMG BX SPEC US GUIDE 12/21/2022 [facility]   BREAST BIOPSY Left 12/21/2022   [place] LT BREAST BX W LOC DEV EA ADD LESION IMG BX SPEC US GUIDE 12/21/2022 [facility]   BREAST BIOPSY Right 12/26/2022   [place] RT BREAST BX W LOC DEV 1ST LESION IMG BX SPEC US GUIDE 12/26/2022 [facility]   BREAST BIOPSY Right 12/26/2022   [place] RT BREAST BX W LOC DEV EA ADD LESION IMG BX SPEC US GUIDE 12/26/2022 [facility]   BREAST BIOPSY Right 12/26/2022   MM RT BREAST BX W LOC DEV 1ST LESION IMAGE BX SPEC STEREO GUIDE 12/26/2022 [facility]   DENTAL SURGERY     NSVD     x2   TUBAL LIGATION  1976   Patient Active Problem List   Diagnosis Date Noted   Malignant neoplasm of overlapping sites of right breast in female, estrogen receptor negative (HCC) 12/31/2022   Malignant neoplasm of upper-outer quadrant of left breast in female, estrogen receptor positive (HCC) 12/31/2022   Cough    Cigarette nicotine dependence without complication    Stroke (HCC) 05/25/2020   Primary hypertension     REFERRING PROVIDER: Dr. Chevis Pretty  REFERRING DIAG: Bilateral breast cancer  THERAPY DIAG:  Malignant  neoplasm of upper-outer quadrant of right breast in female, estrogen receptor positive (HCC)  Abnormal posture  Difficulty in walking, not elsewhere classified  Hemiplegia and hemiparesis following cerebral infarction affecting left non-dominant side (HCC)  Unsteadiness on feet  Rationale for Evaluation and Treatment: Rehabilitation  ONSET DATE: 11/29/2022  SUBJECTIVE:  SUBJECTIVE STATEMENT: Patient reports she is here today to be seen by her medical team for her newly diagnosed bilateral breast cancer.   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 It measures 3.2 cm on the left breast and there are 2 masses on the right breast measuring 2.7 cm and 8 mm and all masses are located in the upper outer quadrant on the respective sides. It is ER/PR positive and HER2 negative on the left and ER/PR negative and HER2 positive on the right with a Ki67 of 40% on the left and 15% on the right. She has a known positive left axillary lymph node. She had a significant CVA on 05/25/2020 resulting in left hemiparesis. She uses a single point cane and ambulates independently with that although is still limited.  PATIENT GOALS:   reduce lymphedema risk and learn post op HEP.   PAIN:  Are you having pain? No  PRECAUTIONS: Active CA Fall  RED FLAGS: None   HAND DOMINANCE: right  WEIGHT BEARING RESTRICTIONS: No  FALLS:  Has patient fallen in last 6 months? No  LIVING ENVIRONMENT: Patient lives with: alone Lives in: House/apartment Has following equipment at home: Single point cane  OCCUPATION: On disability  LEISURE: She walks 20 min/day with her rolling walker  PRIOR  LEVEL OF FUNCTION: Requires assistive device for independence   OBJECTIVE: Note: Objective measures were completed at Evaluation unless otherwise noted.  COGNITION: Overall cognitive status: Within functional limits for tasks assessed    POSTURE:  Forward head and rounded shoulders posture  UPPER EXTREMITY AROM/PROM:  A/PROM RIGHT   eval   Shoulder extension 52  Shoulder flexion 149  Shoulder abduction 145  Shoulder internal rotation 80  Shoulder external rotation 40    (Blank rows = not tested)  A/PROM LEFT   Eval - limited by CVA  Shoulder extension 44  Shoulder flexion 104  Shoulder abduction 84  Shoulder internal rotation NT  Shoulder external rotation NT    (Blank rows = not tested)  CERVICAL AROM: All within normal limits  UPPER EXTREMITY STRENGTH: WFL  LYMPHEDEMA ASSESSMENTS (in cm):   LANDMARK RIGHT   eval  10 cm proximal to olecranon process 27.3  Olecranon process 25  10 cm proximal to ulnar styloid process 19.7  Just proximal to ulnar styloid process 16.2  Across hand at thumb web space 19  At base of 2nd digit 6.7  (Blank rows = not tested)  LANDMARK LEFT   eval  10 cm proximal to olecranon process 26.9  Olecranon process 25.1  10 cm proximal to ulnar styloid process 18.4  Just proximal to ulnar styloid process 16.1  Across hand at thumb web space 18  At base of 2nd digit 6.6  (Blank rows = not tested)  L-DEX LYMPHEDEMA SCREENING:  The patient was assessed using the L-Dex machine today to produce a lymphedema index baseline score. The patient will be reassessed on a regular basis (typically every 3 months) to obtain new L-Dex scores. If the score is > 6.5 points away from his/her baseline score indicating onset of subclinical lymphedema, it will be recommended to wear a compression garment for 4 weeks, 12 hours per day and then be reassessed. If the score continues to be > 6.5 points from baseline at reassessment, we will initiate lymphedema  treatment. Assessing in this manner has a 95%  rate of preventing clinically significant lymphedema.   L-DEX FLOWSHEETS - 01/02/23 1400       L-DEX LYMPHEDEMA SCREENING   Measurement Type --   Bilateral breast CA; contraindicated for SOZO            QUICK DASH SURVEY:  Neldon Mc - 01/02/23 0001     Open a tight or new jar No difficulty    Do heavy household chores (wash walls, wash floors) No difficulty    Carry a shopping bag or briefcase No difficulty    Wash your back No difficulty    Use a knife to cut food No difficulty    Recreational activities in which you take some force or impact through your arm, shoulder, or hand (golf, hammering, tennis) No difficulty    During the past week, to what extent has your arm, shoulder or hand problem interfered with your normal social activities with family, friends, neighbors, or groups? Not at all    During the past week, to what extent has your arm, shoulder or hand problem limited your work or other regular daily activities Not at all    Arm, shoulder, or hand pain. None    Tingling (pins and needles) in your arm, shoulder, or hand None    Difficulty Sleeping No difficulty    DASH Score 0 %              PATIENT EDUCATION:  Education details: Lymphedema risk reduction and post op shoulder/posture HEP Person educated: Patient Education method: Explanation, Demonstration, Handout Education comprehension: Patient verbalized understanding and returned demonstration  HOME EXERCISE PROGRAM: Patient was instructed today in a home exercise program today for post op shoulder range of motion. These included active assist shoulder flexion in sitting, scapular retraction, wall walking with shoulder abduction, and hands behind head external rotation.  She was encouraged to do these twice a day, holding 3 seconds and repeating 5 times when permitted by her physician.   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 It measures 3.2 cm on the left breast and there are 2 masses on the right breast measuring 2.7 cm and 8 mm and all masses are located in the upper outer quadrant on the respective sides. It is ER/PR positive and HER2 negative on the left and ER/PR negative and HER2 positive on the right with a Ki67 of 40% on the left and 15% on the right. She has a known positive left axillary lymph node. She had a significant CVA on 05/25/2020 resulting in left hemiparesis. She uses a single point cane and ambulates independently with that although is still limited.Her multidisciplinary medical team met prior to her assessments to determine a recommended treatment plan. She is planning to have neoadjuvant chemotherapy followed by a bilateral lumpectomy, right sentinel node biopsy, left targeted node axillary dissection, bilateral radiation, and anti-estrogen therapy. She will benefit from a post op PT reassessment to determine needs and from L-Dex screens every 3 months for 2 years to detect subclinical lymphedema.  Pt will benefit from skilled therapeutic intervention to improve on the following deficits: Decreased knowledge of precautions, impaired UE functional use, pain, decreased ROM, postural dysfunction.   PT treatment/interventions: ADL/self-care home management, pt/family education, therapeutic exercise  REHAB POTENTIAL: Excellent  CLINICAL DECISION MAKING: Stable/uncomplicated  EVALUATION COMPLEXITY: Low   GOALS: Goals reviewed with patient? YES  LONG TERM GOALS: (STG=LTG)    Name Target Date Goal status  1 Pt will be able to verbalize understanding of pertinent lymphedema risk reduction practices relevant to her dx specifically related to skin care.  Baseline:  No knowledge 01/02/2023 Achieved at eval  2 Pt will be able to return demo and/or verbalize  understanding of the post op HEP related to regaining shoulder ROM. Baseline:  No knowledge 01/02/2023 Achieved at eval  3 Pt will be able to verbalize understanding of the importance of attending the post op After Breast CA Class for further lymphedema risk reduction education and therapeutic exercise.  Baseline:  No knowledge 01/02/2023 Achieved at eval  4 Pt will demo she has regained full shoulder ROM and function post operatively compared to baselines.  Baseline: See objective measurements taken today. 07/03/2023     PLAN:  PT FREQUENCY/DURATION: EVAL and 1 follow up appointment.   PLAN FOR NEXT SESSION: will reassess 3-4 weeks post op to determine needs.   Patient will follow up at outpatient cancer rehab 3-4 weeks following surgery.  If the patient requires physical therapy at that time, a specific plan will be dictated and sent to the referring physician for approval. The patient was educated today on appropriate basic range of motion exercises to begin post operatively and the importance of attending the After Breast Cancer class following surgery.  Patient was educated today on lymphedema risk reduction practices as it pertains to recommendations that will benefit the patient immediately following surgery.  She verbalized good understanding.    Physical Therapy Information for After Breast Cancer Surgery/Treatment:  Lymphedema is a swelling condition that you may be at risk for in your arm if you have lymph nodes removed from the armpit area.  After a sentinel node biopsy, the risk is approximately 5-9% and is higher after an axillary node dissection.  There is treatment available for this condition and it is not life-threatening.  Contact your physician or physical therapist with concerns. You may begin the 4 shoulder/posture exercises (see additional sheet) when permitted by your physician (typically a week after surgery).  If you have drains, you may need to wait until those are  removed before beginning range of motion exercises.  A general recommendation is to not lift your arms above shoulder height until drains are removed.  These exercises should be done to your tolerance and gently.  This is not a "no pain/no gain" type of recovery so listen to your body and stretch into the range of motion that you can tolerate, stopping if you have pain.  If you are having immediate reconstruction, ask your plastic surgeon about doing exercises as he or she may want you to wait. We encourage you to attend the free one time ABC (After Breast Cancer) class offered by [facility].  You will learn information related to lymphedema risk, prevention and treatment and additional exercises to regain mobility following surgery.  You can call [phone] for more information.  This is offered the 1st and 3rd Monday of each month.  You only attend the class one time. While undergoing any medical procedure or treatment, try to avoid blood pressure being taken or needle sticks from occurring on the arm on the side of cancer.   This recommendation begins after surgery and continues for the rest of your life.  This may help reduce your risk of getting lymphedema (swelling in your arm). An excellent resource for those seeking information on lymphedema  is the National Lymphedema Network's web site. It can be accessed at www.lymphnet.org If you notice swelling in your hand, arm or breast at any time following surgery (even if it is many years from now), please contact your doctor or physical therapist to discuss this.  Lymphedema can be treated at any time but it is easier for you if it is treated early on.  If you feel like your shoulder motion is not returning to normal in a reasonable amount of time, please contact your surgeon or physical therapist.  [facility] [phone]. [address], [place] [address]  ABC CLASS After Breast  Cancer Class  After Breast Cancer Class is a specially designed exercise class to assist you in a safe recover after having breast cancer surgery.  In this class you will learn how to get back to full function whether your drains were just removed or if you had surgery a month ago.  This one-time class is held the 1st and 3rd Monday of every month from 11:00 a.m. until 12:00 noon virtually.  This class is FREE and space is limited. For more information or to register for the next available class, call [phone].  Class Goals  Understand specific stretches to improve the flexibility of you chest and shoulder. Learn ways to safely strengthen your upper body and improve your posture. Understand the warning signs of infection and why you may be at risk for an arm infection. Learn about Lymphedema and prevention.  ** You do not attend this class until after surgery.  Drains must be removed to participate  Patient was instructed today in a home exercise program today for post op shoulder range of motion. These included active assist shoulder flexion in sitting, scapular retraction, wall walking with shoulder abduction, and hands behind head external rotation.  She was encouraged to do these twice a day, holding 3 seconds and repeating 5 times when permitted by her physician.  Bethann Punches, [place] 01/02/23 3:07 PM

## 2023-01-02 NOTE — Research (Signed)
Trial: S2205, ICE COMPRESS: RANDOMIZED TRIAL OF LIMB CRYOCOMPRESSION VERSUS CONTINUOUS COMPRESSION VERSUS LOW CYCLIC COMPRESSION FOR THE PREVENTION OF TAXANE-INDUCED PERIPHERAL NEUROPATHY Patient Donna [name] was identified by Dr Pamelia Hoit as a potential candidate for the above listed study.  This Clinical Research Nurse met with Donna [name], [id number], on 01/02/23 in a manner and location that ensures patient privacy to discuss participation in the above listed research study.  Patient is Accompanied by her daughter .   Patient declines to participate at this time due to claustrophobia and inability to be still enough for MRIs, as she has a constant need to fidget. Provided her with contact information for the research team in case she changes her mind. Donna Chance Levora Werden, RN, BSN, [facility]  [facility]  [facility] Clinical Research Nurse [facility] [place] Direct Dial [phone] 01/02/2023 4:33 PM

## 2023-01-02 NOTE — Assessment & Plan Note (Addendum)
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 After reviewing the pathology in detail, we proceeded to discuss the different treatment options between surgery, radiation, chemotherapy, antiestrogen therapies.  Treatment plan: Neoadjuvant TCHP x 6 cycles followed by HP maintenance versus Kadcyla maintenance Breast conserving surgery with targeted node surgery Adjuvant radiation Adjuvant antiestrogen therapy  Chemotherapy Counseling: I discussed the risks and benefits of chemotherapy including the risks of nausea/ vomiting, risk of infection from low WBC count, fatigue due to chemo or anemia, bruising or bleeding due to low platelets, mouth sores, loss/ change in taste and decreased appetite. Liver and kidney function will be monitored through out chemotherapy as abnormalities in liver and kidney function may be a side effect of treatment. Cardiac dysfunction due to   Herceptin and Perjeta and neuropathy risk from Taxotere were discussed in detail. Risk of permanent bone marrow dysfunction and leukemia due to chemo were also discussed.  Plan: Port, chemo class, follow-up to start chemo in 1 to 2 weeks

## 2023-01-03 ENCOUNTER — Encounter: Payer: Self-pay | Admitting: Hematology and Oncology

## 2023-01-03 MED ORDER — PROCHLORPERAZINE MALEATE 10 MG PO TABS: 10.0000 mg | ORAL_TABLET | Freq: Four times a day (QID) | ORAL | 1 refills | Status: DC | PRN

## 2023-01-03 MED ORDER — DEXAMETHASONE 4 MG PO TABS: ORAL_TABLET | ORAL | 1 refills | Status: DC

## 2023-01-03 MED ORDER — LIDOCAINE-PRILOCAINE 2.5-2.5 % EX CREA: TOPICAL_CREAM | CUTANEOUS | 3 refills | Status: DC

## 2023-01-03 MED ORDER — ONDANSETRON HCL 8 MG PO TABS: 8.0000 mg | ORAL_TABLET | Freq: Three times a day (TID) | ORAL | 1 refills | Status: DC | PRN

## 2023-01-03 NOTE — Progress Notes (Signed)
[facility] CONSULT NOTE  Patient [name] Team: [name], Donna [name] Total Access as PCP - General (Family Medicine) Donnelly Angelica, RN as Oncology Nurse Navigator Pershing Proud, RN as Oncology Nurse Navigator Griselda Miner, MD as Consulting Physician (General Surgery) Serena Croissant, MD as Consulting Physician (Hematology and Oncology) Dorothy Puffer, MD as Consulting Physician (Radiation Oncology)  CHIEF COMPLAINTS/PURPOSE OF CONSULTATION:  Newly diagnosed breast cancer  HISTORY OF PRESENTING ILLNESS: Ms. Salih is a 72 year old with screening mammogram detected bilateral breast masses and calcifications.  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  There is a second mass 0.9 cm adjacent biopsy was benign discordant, 1 lymph node biopsy was positive. 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  SUMMARY OF ONCOLOGIC HISTORY: Oncology History  Malignant neoplasm of overlapping sites of right breast in female, estrogen receptor negative (HCC)  12/31/2022 Initial Diagnosis   Malignant neoplasm of overlapping sites of right breast in female, estrogen receptor negative (HCC)   01/01/2023 Cancer Staging   Staging form: Breast, AJCC 8th Edition - Clinical stage from 01/01/2023: Stage IIA (cT2, cN0, cM0, G3, ER-, PR-, HER2+) - Signed by Serena Croissant, MD on 01/02/2023 Stage prefix: Initial diagnosis Method of lymph node assessment: Clinical Histologic grading system: 3 grade system   Malignant neoplasm of upper-outer  quadrant of left breast in female, estrogen receptor positive (HCC)  12/31/2022 Initial Diagnosis   Malignant neoplasm of upper-outer quadrant of left breast in female, estrogen receptor positive (HCC)   01/01/2023 Cancer Staging   Staging form: Breast, AJCC 8th Edition - Clinical: Stage IIB (cT2, cN1(f), cM0, G3, ER+, PR+, HER2-) - Signed by Ronny Bacon, PA-C on 01/01/2023 Method of lymph node assessment: Core biopsy Histologic grading system: 3 grade system      MEDICAL HISTORY:  Past Medical History:  Diagnosis Date   Allergy    Anxiety    Breast cancer (HCC)    Depression    Hyperlipidemia    no meds    Hypertension     SURGICAL HISTORY: Past Surgical History:  Procedure Laterality Date   BREAST BIOPSY Left 12/21/2022   [place] LT BREAST BX W LOC DEV 1ST LESION IMG BX SPEC US GUIDE 12/21/2022 [facility]   BREAST BIOPSY Left 12/21/2022   [place] LT BREAST BX W LOC DEV EA ADD LESION IMG BX SPEC US GUIDE 12/21/2022 [facility]   BREAST BIOPSY Right 12/26/2022   [place] RT BREAST BX W LOC DEV 1ST LESION IMG BX SPEC US GUIDE 12/26/2022 [facility]   BREAST BIOPSY Right 12/26/2022   [place] RT BREAST BX W LOC DEV EA ADD LESION IMG BX SPEC US GUIDE 12/26/2022 [facility]   BREAST BIOPSY Right 12/26/2022   MM RT BREAST BX W LOC DEV 1ST LESION IMAGE BX SPEC STEREO GUIDE 12/26/2022 [facility]   DENTAL SURGERY     NSVD     x2   TUBAL LIGATION  1976    SOCIAL HISTORY: Social History   Socioeconomic  History   Marital status: Divorced    Spouse name: Not on file   Number of children: Not on file   Years of education: Not on file   Highest education level: Not on file  Occupational History   Not on file  Tobacco Use   Smoking status: Every Day   Smokeless tobacco: Never   Tobacco comments:    8 cigs a day   Substance and Sexual Activity   Alcohol use: No   Drug use: No   Sexual activity: Not on file  Other Topics Concern   Not on file   Social History Narrative   Not on file   Social Drivers of Health   Financial Resource Strain: Not at Risk (04/04/2022)   Received from [place], [place]   [employer]    Financial Resource Strain: 1  Food Insecurity: Not at Risk (04/04/2022)   Received from [place], [place]   Food Insecurity    Food: 1  Transportation Needs: Not at Risk (04/04/2022)   Received from [place], [employer] Needs    Transportation: 1  Physical Activity: Not on File (05/04/2021)   Received from [place], [place]   Physical Activity    Physical Activity: 0  Stress: Not on File (05/04/2021)   Received from [facility], [place]   Stress    Stress: 0  Social Connections: Not on File (09/24/2022)   Received from [employer]   Social Connections    Connectedness: 0  Intimate Partner Violence: Not on file    FAMILY HISTORY: Family History  Problem Relation Age of Onset   Colon cancer Mother        dx'd in her 43's    Colon polyps Sister    Esophageal cancer Sister    Lung cancer Sister    Ovarian cancer Sister    Rectal cancer Neg Hx    Stomach cancer Neg Hx    Pancreatic cancer Neg Hx     ALLERGIES:  has no known allergies.  MEDICATIONS:  Current Outpatient Medications  Medication Sig Dispense Refill   albuterol (VENTOLIN HFA) 108 (90 Base) MCG/ACT inhaler Inhale 2 puffs into the lungs every 4 (four) hours as needed for wheezing or shortness of breath.     amLODipine (NORVASC) 10 MG tablet Take 1 tablet (10 mg total) by mouth daily.     aspirin EC 81 MG EC tablet Take 1 tablet (81 mg total) by mouth daily. Swallow whole. 30 tablet 11   busPIRone (BUSPAR) 5 MG tablet Take 5 mg by mouth 2 (two) times daily.  0   clopidogrel (PLAVIX) 75 MG tablet Take 1 tablet (75 mg total) by mouth daily.     DULoxetine (CYMBALTA) 60 MG capsule Take 60 mg by mouth daily.     fluticasone (FLONASE) 50 MCG/ACT nasal spray Place 1 spray into both nostrils daily.     montelukast (SINGULAIR) 10 MG tablet Take 10 mg by  mouth at bedtime.  0   nicotine (NICODERM CQ - DOSED IN MG/24 HR) 7 mg/24hr patch Place 1 patch (7 mg total) onto the skin daily as needed (Nicotine cravings). 28 patch 0   rosuvastatin (CRESTOR) 20 MG tablet Take 1 tablet (20 mg total) by mouth daily.     traZODone (DESYREL) 50 MG tablet Take 50 mg by mouth at bedtime.  0   No current facility-administered medications for this visit.    REVIEW OF SYSTEMS:   Constitutional: Denies fevers, chills or abnormal night sweats  Breast:  Denies any palpable lumps or discharge All other systems were reviewed with the patient and are negative.  PHYSICAL EXAMINATION: ECOG PERFORMANCE STATUS: 1 - Symptomatic but completely ambulatory  Vitals:   01/02/23 1313  BP: (!) 149/66  Pulse: 74  Resp: 18  Temp: 98 F (36.7 C)  SpO2: 95%   Filed Weights   01/02/23 1313  Weight: 169 lb 6.4 oz (76.8 kg)    GENERAL:alert, no distress and comfortable    LABORATORY DATA:  I have reviewed the data as listed Lab Results  Component Value Date   WBC 8.9 01/02/2023   HGB 14.2 01/02/2023   HCT 43.4 01/02/2023   MCV 95.6 01/02/2023   PLT 232 01/02/2023   Lab Results  Component Value Date   NA 142 01/02/2023   K 5.0 01/02/2023   CL 111 01/02/2023   CO2 27 01/02/2023    RADIOGRAPHIC STUDIES: I have personally reviewed the radiological reports and agreed with the findings in the report.  ASSESSMENT AND PLAN:  Malignant neoplasm of overlapping sites of right breast in female, estrogen receptor negative (HCC) 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%, PR  0%, Ki67 15%, HER2 3+ positive  Pathology and radiology counseling: Discussed with the patient, the details of pathology including the type of breast cancer,the clinical staging, the significance of ER, PR and HER-2/neu receptors and the implications for treatment. After reviewing the pathology in detail, we proceeded to discuss the different treatment options between surgery, radiation, chemotherapy, antiestrogen therapies.  Treatment plan: Neoadjuvant TCHP x 6 cycles followed by HP maintenance versus Kadcyla maintenance Breast conserving surgery with targeted node surgery Adjuvant radiation Adjuvant antiestrogen therapy  Chemotherapy Counseling: I discussed the risks and benefits of chemotherapy including the risks of nausea/ vomiting, risk of infection from low WBC count, fatigue due to chemo or anemia, bruising or bleeding due to low platelets, mouth sores, loss/ change in taste and decreased appetite. Liver and kidney function will be monitored through out chemotherapy as abnormalities in liver and kidney function may be a side effect of treatment. Cardiac dysfunction due to   Herceptin and Perjeta and neuropathy risk from Taxotere were discussed in detail. Risk of permanent bone marrow dysfunction and leukemia due to chemo were also discussed.  Plan: Port, chemo class, follow-up to start chemo in 1 to 2 weeks    All questions were answered. The patient knows to call the clinic with any problems, questions or concerns.    Tamsen Meek, MD 01/03/23

## 2023-01-03 NOTE — Progress Notes (Signed)
START ON PATHWAY REGIMEN - Breast     Cycle 1: A cycle is 21 days:     Pertuzumab      Trastuzumab-xxxx      Docetaxel      Carboplatin    Cycles 2 through 6: A cycle is every 21 days:     Pertuzumab      Trastuzumab-xxxx      Docetaxel      Carboplatin   **Always confirm dose/schedule in your pharmacy ordering system**  Patient Characteristics: Preoperative or Nonsurgical Candidate, M0 (Clinical Staging), Up to cT4c, Any N, M0, Neoadjuvant Therapy followed by Surgery, Invasive Disease, Chemotherapy, HER2 Positive, ER Negative Therapeutic Status: Preoperative or Nonsurgical Candidate, M0 (Clinical Staging) AJCC M Category: cM0 AJCC Grade: G3 ER Status: Negative (-) AJCC 8 Stage Grouping: IIA HER2 Status: Positive (+) AJCC T Category: cT2 AJCC N Category: cN0 PR Status: Negative (-) Breast Surgical Plan: Neoadjuvant Therapy followed by Surgery Intent of Therapy: Curative Intent, Discussed with Patient

## 2023-01-04 ENCOUNTER — Encounter: Payer: Self-pay | Admitting: Hematology and Oncology

## 2023-01-04 ENCOUNTER — Telehealth: Payer: Self-pay | Admitting: *Deleted

## 2023-01-04 ENCOUNTER — Other Ambulatory Visit: Payer: Self-pay

## 2023-01-04 ENCOUNTER — Encounter: Payer: Self-pay | Admitting: Genetic Counselor

## 2023-01-04 NOTE — Progress Notes (Signed)
REFERRING PROVIDER: Serena Croissant, MD  PRIMARY PROVIDER:  Care, Jovita Kussmaul Total Access  PRIMARY REASON FOR VISIT:  1. Malignant neoplasm of overlapping sites of right breast in female, estrogen receptor negative (HCC)   2. Malignant neoplasm of upper-outer quadrant of left breast in female, estrogen receptor positive (HCC)   3. Family history of breast cancer   4. Family history of ovarian cancer   5. Family history of colon cancer    HISTORY OF PRESENT ILLNESS:   Ms. Bahn, a 72 y.o. female, was seen for a [place] cancer genetics consultation at the request of Dr. Pamelia Hoit due to a personal and family history of cancer.  Ms. Lieb presents to clinic today to discuss the possibility of a hereditary predisposition to cancer, to discuss genetic testing, and to further clarify her future cancer risks, as well as potential cancer risks for family members.   In December 2024, at the age of 52, Ms. Kiehne was diagnosed with invasive ductal carcinoma of the left breast (ER/PR positive, HER2 negative) and invasive lobular carcinoma of the right breast (ER/PR negative, HER2 positive). The treatment plan includes neoadjuvant chemotherapy followed by a bilateral lumpectomy, radiation, and antiestrogen therapy.   CANCER HISTORY:  Oncology History  Malignant neoplasm of overlapping sites of right breast in female, estrogen receptor negative (HCC)  12/31/2022 Initial Diagnosis   Malignant neoplasm of overlapping sites of right breast in female, estrogen receptor negative (HCC)   01/01/2023 Cancer Staging   Staging form: Breast, AJCC 8th Edition - Clinical stage from 01/01/2023: Stage IIA (cT2, cN0, cM0, G3, ER-, PR-, HER2+) - Signed by Serena Croissant, MD on 01/02/2023 Stage prefix: Initial diagnosis Method of lymph node assessment: Clinical Histologic grading system: 3 grade system   01/10/2023 -  Chemotherapy   Patient is on Treatment Plan : BREAST  Docetaxel + Carboplatin +  Trastuzumab + Pertuzumab  (TCHP) q21d      Malignant neoplasm of upper-outer quadrant of left breast in female, estrogen receptor positive (HCC)  12/31/2022 Initial Diagnosis   Malignant neoplasm of upper-outer quadrant of left breast in female, estrogen receptor positive (HCC)   01/01/2023 Cancer Staging   Staging form: Breast, AJCC 8th Edition - Clinical: Stage IIB (cT2, cN1(f), cM0, G3, ER+, PR+, HER2-) - Signed by Ronny Bacon, PA-C on 01/01/2023 Method of lymph node assessment: Core biopsy Histologic grading system: 3 grade system   01/10/2023 -  Chemotherapy   Patient is on Treatment Plan : BREAST  Docetaxel + Carboplatin + Trastuzumab + Pertuzumab  (TCHP) q21d         Past Medical History:  Diagnosis Date   Allergy    Anxiety    Breast cancer (HCC)    Depression    Hyperlipidemia    no meds    Hypertension     Past Surgical History:  Procedure Laterality Date   BREAST BIOPSY Left 12/21/2022   [place] LT BREAST BX W LOC DEV 1ST LESION IMG BX SPEC US GUIDE 12/21/2022 [facility]   BREAST BIOPSY Left 12/21/2022   [place] LT BREAST BX W LOC DEV EA ADD LESION IMG BX SPEC US GUIDE 12/21/2022 [facility]   BREAST BIOPSY Right 12/26/2022   [place] RT BREAST BX W LOC DEV 1ST LESION IMG BX SPEC US GUIDE 12/26/2022 [facility]   BREAST BIOPSY Right 12/26/2022   [place] RT BREAST BX W LOC DEV EA ADD LESION IMG BX SPEC US GUIDE 12/26/2022 [facility]   BREAST BIOPSY Right 12/26/2022  MM RT BREAST BX W LOC DEV 1ST LESION IMAGE BX SPEC STEREO GUIDE 12/26/2022 [facility]   DENTAL SURGERY     NSVD     x2   TUBAL LIGATION  1976    Social History   Socioeconomic History   Marital status: Divorced    Spouse name: Not on file   Number of children: Not on file   Years of education: Not on file   Highest education level: Not on file  Occupational History   Not on file  Tobacco Use   Smoking status: Every Day   Smokeless tobacco: Never   Tobacco  comments:    8 cigs a day   Substance and Sexual Activity   Alcohol use: No   Drug use: No   Sexual activity: Not on file  Other Topics Concern   Not on file  Social History Narrative   Not on file   Social Drivers of Health   Financial Resource Strain: Not at Risk (04/04/2022)   Received from [place], [employer]    Financial Resource Strain: 1  Food Insecurity: Not at Risk (04/04/2022)   Received from [place], [employer] Insecurity    Food: 1  Transportation Needs: Not at Risk (04/04/2022)   Received from [place], [employer] Needs    Transportation: 1  Physical Activity: Not on File (05/04/2021)   Received from [place], [place]   Physical Activity    Physical Activity: 0  Stress: Not on File (05/04/2021)   Received from [facility], [place]   Stress    Stress: 0  Social Connections: Not on File (09/24/2022)   Received from [employer]    Connectedness: 0     FAMILY HISTORY:  We obtained a detailed, 4-generation family history.  Significant diagnoses are listed below: Family History  Problem Relation Age of Onset   Colon cancer Mother 68 - 35   Cancer Father 22 - 68       unknown type   Colon polyps Sister    Esophageal cancer Sister 87 - 77   Lung cancer Sister 39 - 18   Ovarian cancer Sister 33   Colon cancer Brother 76   Breast cancer Niece        dx. <50     Ms. Sunny is unaware of previous family history of genetic testing for hereditary cancer risks. There is no reported Ashkenazi Jewish ancestry.   GENETIC COUNSELING ASSESSMENT: Ms. Ledbetter is a 72 y.o. female with a personal and family history of cancer which is somewhat suggestive of a hereditary predisposition to cancer given her personal history of contralateral breast cancer. We, therefore, discussed and recommended the following at today's visit.   DISCUSSION: We discussed that 5 - 10% of cancer is hereditary, with most cases of breast cancer associated with  BRCA1/2.  There are other genes that can be associated with hereditary breast cancer syndromes.  We discussed that testing is beneficial for several reasons including knowing how to follow individuals after completing their treatment, identifying whether potential treatment options would be beneficial, and understanding if other family members could be at risk for cancer and allowing them to undergo genetic testing.   We reviewed the characteristics, features and inheritance patterns of hereditary cancer syndromes. We also discussed genetic testing, including the appropriate family members to test, the process of testing, insurance coverage and turn-around-time for results. We discussed the implications of a negative, positive,  carrier and/or variant of uncertain significant result. We recommended Ms. Sharpnack pursue genetic testing for a panel that includes genes associated with breast, ovarian, and colon cancer.   Ms. Isidoro elected to have [facility] Cancer[facility] Panel. The [facility] CancerNext+RNAinsight Panel includes sequencing, rearrangement analysis, and RNA analysis for the following 39 genes: APC, ATM, BAP1, BARD1, BMPR1A, BRCA1, BRCA2, BRIP1, CDH1, CDKN2A, CHEK2, FH, FLCN, MET, MLH1, MSH2, MSH6, MUTYH, NF1, NTHL1, PALB2, PMS2, PTEN, RAD51C, RAD51D, SMAD4, STK11, TP53, TSC1, TSC2, and VHL (sequencing and deletion/duplication); AXIN2, HOXB13, MBD4, MSH3, POLD1 and POLE (sequencing only); EPCAM and GREM1 (deletion/duplication only).   Based on Ms. Loos's personal and family history of cancer, she meets medical criteria for genetic testing. Despite that she meets criteria, she may still have an out of pocket cost. We discussed that if her out of pocket cost for testing is over $100, the laboratory should contact them to discuss self-pay prices, patient pay assistance programs, if applicable, and other billing options.  PLAN: After considering the risks, benefits, and limitations, Ms. Heindel provided  informed consent to pursue genetic testing and the blood sample was sent to [facility] for analysis of the CancerNext Panel. Results should be available within approximately 2-3 weeks' time, at which point they will be disclosed by telephone to Ms. Knoop, as will any additional recommendations warranted by these results. Ms. Sangha will receive a summary of her genetic counseling visit and a copy of her results once available. This information will also be available in Epic.   Ms. Thrall questions were answered to her satisfaction today. Our contact information was provided should additional questions or concerns arise. Thank you for the referral and allowing [place] to share in the care of your patient.   Lalla Brothers, MS, [facility] Genetic Counselor [email or web address] (P) [phone]  The patient was seen for a total of 20 minutes in face-to-face genetic counseling.  The patient brought her daughter. Drs. Pamelia Hoit and/or Mosetta Putt were available to discuss this case as needed.   _______________________________________________________________________ For Office Staff:  Number of people involved in session: 2 Was an Intern/ student involved with case: no

## 2023-01-04 NOTE — Telephone Encounter (Signed)
Spoke to pt concerning [facility] from 01/02/23. Denies questions or concerns regarding dx or treatment care plan. Encourage pt to call with needs. Received verbal understanding.

## 2023-01-04 NOTE — Telephone Encounter (Signed)
Left vm regarding BMDC from 01/02/23. Contact information provided for questions or needs.

## 2023-01-10 ENCOUNTER — Other Ambulatory Visit: Payer: Self-pay | Admitting: Hematology and Oncology

## 2023-01-10 ENCOUNTER — Other Ambulatory Visit: Payer: 59

## 2023-01-10 DIAGNOSIS — Z170 Estrogen receptor positive status [ER+]: Secondary | ICD-10-CM

## 2023-01-10 DIAGNOSIS — Z171 Estrogen receptor negative status [ER-]: Secondary | ICD-10-CM

## 2023-01-11 ENCOUNTER — Other Ambulatory Visit: Payer: 59

## 2023-01-11 ENCOUNTER — Other Ambulatory Visit: Payer: Self-pay

## 2023-01-17 ENCOUNTER — Encounter: Payer: Self-pay | Admitting: Hematology and Oncology

## 2023-01-17 ENCOUNTER — Inpatient Hospital Stay: Payer: 59

## 2023-01-17 ENCOUNTER — Encounter: Payer: Self-pay | Admitting: *Deleted

## 2023-01-17 ENCOUNTER — Ambulatory Visit (HOSPITAL_COMMUNITY): Payer: 59

## 2023-01-17 ENCOUNTER — Telehealth: Payer: Self-pay | Admitting: *Deleted

## 2023-01-17 ENCOUNTER — Encounter: Payer: Self-pay | Admitting: Genetic Counselor

## 2023-01-17 ENCOUNTER — Telehealth: Payer: Self-pay | Admitting: Genetic Counselor

## 2023-01-17 ENCOUNTER — Telehealth: Payer: Self-pay

## 2023-01-17 ENCOUNTER — Inpatient Hospital Stay: Payer: 59 | Admitting: Pharmacist

## 2023-01-17 DIAGNOSIS — Z1379 Encounter for other screening for genetic and chromosomal anomalies: Secondary | ICD-10-CM | POA: Insufficient documentation

## 2023-01-17 NOTE — Telephone Encounter (Signed)
 She did not come today because she did not know what today's appointment was for. She called the office and LM and no one called her back.  Explained what information the education session covers. She agreed to com 01-23-23 at 0900. She is aware it is a two hour session and that she will meet with the Pharmacist first then the nurse.

## 2023-01-17 NOTE — Telephone Encounter (Signed)
 Spoke to patient to figure out why she didn't attend her ECHO appointment, patient stated she tried to call back and reschedule, she also stated she didn't know where she was supposed to go, that appointments keep popping up but no one is confirming with her, I sent a message to the navigation team to make sure this appointment needed to be rescheduled, and they said it did, I called patient back to get her rescheduled off of the dates that were offered to me, she stated none of those would work so I gave her the number and also reached back out to navigation to see what to do since her port will be placed on the 7th

## 2023-01-17 NOTE — Telephone Encounter (Signed)
 I contacted Ms. Astacio to discuss her genetic testing results. No pathogenic variants were identified in the 39 genes analyzed. Detailed clinic note to follow.  The test report has been scanned into EPIC and is located under the Molecular Pathology section of the Results Review tab.  A portion of the result report is included below for reference.   Elisheba Mcdonnell, MS, [facility] Genetic Counselor [email or web address] (P) [phone]

## 2023-01-17 NOTE — Progress Notes (Deleted)
 [facility]       Telephone: [phone]?Fax: [phone]   Oncology Clinical Pharmacist Practitioner Initial Assessment  Donna [name] is a 73 y.o. female with a diagnosis of breast cancer. They were contacted today via in-person visit.  Indication/Regimen TCHP: Trastuzumab  (Herceptin ) and pertuzumab  (Perjeta ) and docetaxel  (Taxotere ) and carboplatin  (Paraplatin ) are being used appropriately for treatment of breast cancer by Dr. Vinay Gudena.      Wt Readings from Last 1 Encounters:  01/02/23 169 lb 6.4 oz (76.8 kg)    Estimated body surface area is 1.93 meters squared as calculated from the following:   Height as of 01/02/23: 5' 9 (1.753 m).   Weight as of 01/02/23: 169 lb 6.4 oz (76.8 kg).  The dosing regimen is every 21 days for 6 cycles  Trastuzumab  (8 mg/kg load, 6 mg/kg maintenance) on Day 1 Pertuzumab  (840 mg load, 420 mg maintenance) on Day 1 Docetaxel  (75 mg/m2) on Day 1 Carboplatin  (AUC 6) on Day 1 Pegfilgrastim  (6 mg) on Day 3  Dose Modifications Per Dr. Odean, docetaxel  will be reduced to 60 mg/m2 and carboplatin  to AUC 4   Allergies No Known Allergies  Vitals    01/02/2023    1:13 PM 08/23/2021    2:16 PM 08/04/2021    2:44 PM  Oncology Vitals  Height 175 cm    Weight 76.839 kg    Weight (lbs) 169 lbs 6 oz    BMI 25.02 kg/m2    Temp 98 F (36.7 C)    Pulse Rate 74 70 75  BP 149/66 120/80 139/61  Resp 18    SpO2 95 %    BSA (m2) 1.93 m2       Laboratory Data    Latest Ref Rng & Units 01/02/2023   12:52 PM 05/29/2020    6:22 AM 05/26/2020    3:19 AM  CBC EXTENDED  WBC 4.0 - 10.5 K/uL 8.9  10.3  10.6   RBC 3.87 - 5.11 MIL/uL 4.54  4.44  4.38   Hemoglobin 12.0 - 15.0 g/dL 85.7  85.4  85.5   HCT 36.0 - 46.0 % 43.4  43.4  43.1   Platelets 150 - 400 K/uL 232  234  247   NEUT# 1.7 - 7.7 K/uL 5.5     Lymph# 0.7 - 4.0 K/uL 2.6          Latest Ref Rng & Units 01/02/2023   12:52 PM 05/26/2020    3:19 AM 05/25/2020    1:49  PM  CMP  Glucose 70 - 99 mg/dL 83  89  88   BUN 8 - 23 mg/dL 12  17  22    Creatinine 0.44 - 1.00 mg/dL 9.10  9.16  9.09   Sodium 135 - 145 mmol/L 142  138  140   Potassium 3.5 - 5.1 mmol/L 5.0  3.7  4.6   Chloride 98 - 111 mmol/L 111  110  110   CO2 22 - 32 mmol/L 27  21    Calcium  8.9 - 10.3 mg/dL 9.7  9.1    Total Protein 6.5 - 8.1 g/dL 5.8     Total Bilirubin <1.2 mg/dL 0.5     Alkaline Phos 38 - 126 U/L 74     AST 15 - 41 U/L 21     ALT 0 - 44 U/L 16      No results found for: MG No results found for: CA2729   Contraindications Contraindications were reviewed? {  yes/no:20286} Contraindications to therapy were identified? {YES/NO:21197}  Safety Precautions (written information also provided) The following safety precautions for the use of TCHP were reviewed:  Decreased hemoglobin, part of the red blood cells that carry iron and oxygen Decreased platelet count and increased risk of bleeding Decreased white blood cells (WBCs) and increased risk for infection Fever: reviewed the importance of having a thermometer and the Centers for Disease Control and Prevention (CDC) definition of fever which is 100.59F (38C) or higher. Patient should call 24/7 triage at [phone] if experiencing a fever or any other symptoms Hair Loss Muscle or joint pain or weakness Fatigue Nausea or vomiting Mouth sores Diarrhea Irregular menses (if applicable) Peripheral neuropathy: numbness or tingling in hands and feet Fluid retention or swelling (edema) Nail Changes Rash or itchy skin Fluid retention or swelling (edema) Taste changes Changes in kidney function Changes in electrolytes and other laboratory values (low potassium, low magnesium) Cardiotoxicity from trastuzumab  Infusion reactions Pneumonitis from trastuzumab  Docetaxel  irritating veins Liver toxicity from docetaxel  Docetaxel  can cause eye pain, blurred vision, tearing, and light sensitivity Handling body fluids and  waste Intimacy, sexual activity, contraception, fertility  Medication Reconciliation Current Outpatient Medications  Medication Sig Dispense Refill   albuterol  (VENTOLIN  HFA) 108 (90 Base) MCG/ACT inhaler Inhale 2 puffs into the lungs every 4 (four) hours as needed for wheezing or shortness of breath.     amLODipine  (NORVASC ) 10 MG tablet Take 1 tablet (10 mg total) by mouth daily.     aspirin  EC 81 MG EC tablet Take 1 tablet (81 mg total) by mouth daily. Swallow whole. 30 tablet 11   busPIRone  (BUSPAR ) 5 MG tablet Take 5 mg by mouth 2 (two) times daily.  0   clopidogrel  (PLAVIX ) 75 MG tablet Take 1 tablet (75 mg total) by mouth daily.     dexamethasone  (DECADRON ) 4 MG tablet Take 2 tabs by mouth 2 times daily starting day before chemo. Then take 2 tabs daily for 2 days starting day after chemo. Take with food. 30 tablet 1   DULoxetine  (CYMBALTA ) 60 MG capsule Take 60 mg by mouth daily.     fluticasone  (FLONASE ) 50 MCG/ACT nasal spray Place 1 spray into both nostrils daily.     lidocaine -prilocaine  (EMLA ) cream Apply to affected area once 30 g 3   montelukast  (SINGULAIR ) 10 MG tablet Take 10 mg by mouth at bedtime.  0   nicotine  (NICODERM CQ  - DOSED IN MG/24 HR) 7 mg/24hr patch Place 1 patch (7 mg total) onto the skin daily as needed (Nicotine  cravings). 28 patch 0   ondansetron  (ZOFRAN ) 8 MG tablet Take 1 tablet (8 mg total) by mouth every 8 (eight) hours as needed for nausea or vomiting. Start on the third day after chemotherapy. 30 tablet 1   prochlorperazine  (COMPAZINE ) 10 MG tablet Take 1 tablet (10 mg total) by mouth every 6 (six) hours as needed for nausea or vomiting. 30 tablet 1   rosuvastatin  (CRESTOR ) 20 MG tablet Take 1 tablet (20 mg total) by mouth daily.     traZODone  (DESYREL ) 50 MG tablet Take 50 mg by mouth at bedtime.  0   No current facility-administered medications for this visit.    Medication reconciliation is based on the patient's most recent medication list in the  electronic medical record (EMR) including herbal products and OTC medications.   The patient's medication list was reviewed today with the patient? Yes   Drug-drug interactions (DDIs) DDIs were evaluated? Yes Significant DDIs identified?  No   Drug-Food Interactions Drug-food interactions were evaluated? Yes Drug-food interactions identified? No   Follow-up Plan  Treatment start date: 01/23/23 -- tx plan updated as port is 01/22/23. Still needs to be scheduled. Scheduling aware. Port placement date: 01/22/23 ECHO date: 01/17/23 We reviewed the prescriptions, premedications, and treatment regimen with the patient. Possible side effects of the treatment regimen were reviewed and management strategies were discussed. Can use loperamide as needed for diarrhea, loratadine as needed for G-CSF bone pain, and Senna-S as needed for constipation.  Clinical pharmacy will assist Dr. Vinay Gudena and Rossi Brunty on an as needed basis going forward  Iviona Hole participated in the discussion, expressed understanding, and voiced agreement with the above plan. All questions were answered to her satisfaction. The patient was advised to contact the clinic at [phone] with any questions or concerns prior to her return visit.   I spent 60 minutes assessing the patient.  Iman Reinertsen A. Lucila, PharmD, BCOP, CPP  Norleen DELENA Lucila, RPH-CPP, 01/17/2023 11:47 AM  **Disclaimer: This note was dictated with voice recognition software. Similar sounding words can inadvertently be transcribed and this note may contain transcription errors which may not have been corrected upon publication of note.**

## 2023-01-18 ENCOUNTER — Telehealth: Payer: Self-pay | Admitting: *Deleted

## 2023-01-18 ENCOUNTER — Encounter: Payer: Self-pay | Admitting: Hematology and Oncology

## 2023-01-18 ENCOUNTER — Ambulatory Visit: Payer: Self-pay | Admitting: Genetic Counselor

## 2023-01-18 ENCOUNTER — Telehealth: Payer: Self-pay | Admitting: Hematology and Oncology

## 2023-01-18 ENCOUNTER — Encounter: Payer: Self-pay | Admitting: *Deleted

## 2023-01-18 DIAGNOSIS — Z1379 Encounter for other screening for genetic and chromosomal anomalies: Secondary | ICD-10-CM

## 2023-01-18 NOTE — Progress Notes (Signed)
 HPI:   Donna [name] was previously seen in the [facility] due to a personal and family history of cancer and concerns regarding a hereditary predisposition to cancer. Please refer to our prior cancer genetics clinic note for more information regarding our discussion, assessment and recommendations, at the time. Donna [name] recent genetic test results were disclosed to her, as were recommendations warranted by these results. These results and recommendations are discussed in more detail below.  CANCER HISTORY:  Oncology History  Malignant neoplasm of overlapping sites of right breast in female, estrogen receptor negative (HCC)  12/31/2022 Initial Diagnosis   Malignant neoplasm of overlapping sites of right breast in female, estrogen receptor negative (HCC)   01/01/2023 Cancer Staging   Staging form: Breast, AJCC 8th Edition - Clinical stage from 01/01/2023: Stage IIA (cT2, cN0, cM0, G3, ER-, PR-, HER2+) - Signed by Odean Potts, MD on 01/02/2023 Stage prefix: Initial diagnosis Method of lymph node assessment: Clinical Histologic grading system: 3 grade system   01/23/2023 -  Chemotherapy   Patient is on Treatment Plan : BREAST  Docetaxel  + Carboplatin  + Trastuzumab  + Pertuzumab   (TCHP) q21d       Genetic Testing   [facility] Cancer[facility]+RNA was Negative. Report date is 01/16/2023.   The [facility] CancerNext+RNAinsight Panel includes sequencing, rearrangement analysis, and RNA analysis for the following 39 genes: APC, ATM, BAP1, BARD1, BMPR1A, BRCA1, BRCA2, BRIP1, CDH1, CDKN2A, CHEK2, FH, FLCN, MET, MLH1, MSH2, MSH6, MUTYH, NF1, NTHL1, PALB2, PMS2, PTEN, RAD51C, RAD51D, SMAD4, STK11, TP53, TSC1, TSC2, and VHL (sequencing and deletion/duplication); AXIN2, HOXB13, MBD4, MSH3, POLD1 and POLE (sequencing only); EPCAM and GREM1 (deletion/duplication only).    Malignant neoplasm of upper-outer quadrant of left breast in female, estrogen receptor positive (HCC)  12/31/2022 Initial  Diagnosis   Malignant neoplasm of upper-outer quadrant of left breast in female, estrogen receptor positive (HCC)   01/01/2023 Cancer Staging   Staging form: Breast, AJCC 8th Edition - Clinical: Stage IIB (cT2, cN1(f), cM0, G3, ER+, PR+, HER2-) - Signed by Lanell Donald Stagger, PA-C on 01/01/2023 Method of lymph node assessment: Core biopsy Histologic grading system: 3 grade system   01/23/2023 -  Chemotherapy   Patient is on Treatment Plan : BREAST  Docetaxel  + Carboplatin  + Trastuzumab  + Pertuzumab   (TCHP) q21d       Genetic Testing   [facility] CancerNext+RNA was Negative. Report date is 01/16/2023.   The [facility] CancerNext+RNAinsight Panel includes sequencing, rearrangement analysis, and RNA analysis for the following 39 genes: APC, ATM, BAP1, BARD1, BMPR1A, BRCA1, BRCA2, BRIP1, CDH1, CDKN2A, CHEK2, FH, FLCN, MET, MLH1, MSH2, MSH6, MUTYH, NF1, NTHL1, PALB2, PMS2, PTEN, RAD51C, RAD51D, SMAD4, STK11, TP53, TSC1, TSC2, and VHL (sequencing and deletion/duplication); AXIN2, HOXB13, MBD4, MSH3, POLD1 and POLE (sequencing only); EPCAM and GREM1 (deletion/duplication only).      FAMILY HISTORY:  We obtained a detailed, 4-generation family history.  Significant diagnoses are listed below:      Family History  Problem Relation Age of Onset   Colon cancer Mother 37 - 25   Cancer Father 12 - 1        unknown type   Colon polyps Sister     Esophageal cancer Sister 49 - 43   Lung cancer Sister 56 - 83   Ovarian cancer Sister 51   Colon cancer Brother 48   Breast cancer Niece          dx. <50       Donna [name] is unaware of previous family  history of genetic testing for hereditary cancer risks. There is no reported Ashkenazi Jewish ancestry.   GENETIC TEST RESULTS:  The [facility] CancerNext Panel found no pathogenic mutations.   The [facility] CancerNext+RNAinsight Panel includes sequencing, rearrangement analysis, and RNA analysis for the following 39 genes: APC, ATM, BAP1, BARD1, BMPR1A, BRCA1,  BRCA2, BRIP1, CDH1, CDKN2A, CHEK2, FH, FLCN, MET, MLH1, MSH2, MSH6, MUTYH, NF1, NTHL1, PALB2, PMS2, PTEN, RAD51C, RAD51D, SMAD4, STK11, TP53, TSC1, TSC2, and VHL (sequencing and deletion/duplication); AXIN2, HOXB13, MBD4, MSH3, POLD1 and POLE (sequencing only); EPCAM and GREM1 (deletion/duplication only).   The test report has been scanned into EPIC and is located under the Molecular Pathology section of the Results Review tab.  A portion of the result report is included below for reference. Genetic testing reported out on 01/16/2023.       Even though a pathogenic variant was not identified, possible explanations for the cancer in the family may include: There may be no hereditary risk for cancer in the family. The cancers in Donna [name] and/or her family may be due to other genetic or environmental factors. There may be a gene mutation in one of these genes that current testing methods cannot detect, but that chance is small. There could be another gene that has not yet been discovered, or that we have not yet tested, that is responsible for the cancer diagnoses in the family.  It is also possible there is a hereditary cause for the cancer in the family that Donna [name] did not inherit.  Therefore, it is important to remain in touch with cancer genetics in the future so that we can continue to offer Donna [name] the most up to date genetic testing.   ADDITIONAL GENETIC TESTING:  We discussed with Donna [name] that her genetic testing was fairly extensive.  If there are genes identified to increase cancer risk that can be analyzed in the future, we would be happy to discuss and coordinate this testing at that time.    CANCER SCREENING RECOMMENDATIONS:  Donna [name] test result is considered negative (normal).  This means that we have not identified a hereditary cause for her personal and family history of cancer at this time.   An individual's cancer risk and medical management are not  determined by genetic test results alone. Overall cancer risk assessment incorporates additional factors, including personal medical history, family history, and any available genetic information that may result in a personalized plan for cancer prevention and surveillance. Therefore, it is recommended she continue to follow the cancer management and screening guidelines provided by her oncology and primary healthcare provider.  Based on the reported personal and family history, specific cancer screenings for Donna [name] and her family include:  Colon Cancer Screening: Due to Donna [name]'s family history of colon cancer, she is recommended to repeat colonoscopies every 5 years. More frequent colonoscopies may be recommended if polyps are identified.  RECOMMENDATIONS FOR FAMILY MEMBERS:   Since she did not inherit a mutation in a cancer predisposition gene included on this panel, her children could not have inherited a mutation from her in one of these genes. Individuals in this family might be at some increased risk of developing cancer, over the general population risk, due to the family history of cancer. We recommend women in this family have a yearly mammogram beginning at age 6, or 4 years younger than the earliest onset of cancer, an annual clinical breast exam, and perform monthly breast self-exams.  Other members  of the family may still carry a pathogenic variant in one of these genes that Donna [name] did not inherit. Based on the family history, we recommend her siblings and niece who was diagnosed with breast cancer consider genetic counseling and testing.   FOLLOW-UP:  Cancer genetics is a rapidly advancing field and it is possible that new genetic tests will be appropriate for her and/or her family members in the future. We encouraged her to remain in contact with cancer genetics on an annual basis so we can update her personal and family histories and let her know of advances  in cancer genetics that may benefit this family.   Our contact number was provided. Donna [name] questions were answered to her satisfaction, and she knows she is welcome to call us  at anytime with additional questions or concerns.   Lashell Moffitt, MS, [facility] Genetic Counselor [email or web address] (P) [phone]

## 2023-01-18 NOTE — Telephone Encounter (Signed)
 Left patient a voicemail regarding scheduled appointment times/dates; left callback for scheduling if and concerns or questions regarding schedule

## 2023-01-18 NOTE — Telephone Encounter (Signed)
 Spoke to pt regarding echo on 1/8 at 1pm. Pt agree to time as she will have transportation for chemo ed class earlier that morning. Confirmed all future appts and discussed schedule once chemo workup has been completed.  Denies further questions or needs at this time. Encourage pt to reach out with concerns. Received verbal understanding.

## 2023-01-20 ENCOUNTER — Other Ambulatory Visit: Payer: Self-pay | Admitting: Radiology

## 2023-01-20 ENCOUNTER — Other Ambulatory Visit: Payer: Self-pay

## 2023-01-21 NOTE — H&P (Signed)
 Chief Complaint: Patient was seen in consultation today for Port-A-Cath placement for chemotherapy treatment of Breast cancer.  Referring Physician(s):  Dr. Mackey Chad, MD  Supervising Physician: Hughes Simmonds  Patient Status: [facility] - Out-pt  Full Code status per patient and chart review.  History of Present Illness: Donna [name] is a 73 y.o. female with PMHx of HTN, HLD, anxiety/depression, CVA 2022 and recent breast cancer diagnosis on 12/31/22. She is followed by Dr. Gudena for breast cancer management.  Per Dr. Gara progress note on 01/02/23: Donna [name] is a 73 year old with screening mammogram detected bilateral breast masses and calcifications.    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  There is a second mass 0.9 cm adjacent biopsy was benign discordant, 1 lymph node biopsy was positive.  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 Patient is scheduled for same in IR today.   All labs and medications are within acceptable parameters. No pertinent allergies. Patient has been NPO since midnight.    Currently without any significant complaints. Patient alert and laying in bed,calm. Denies any fevers, headache, chest pain, SOB, cough, abdominal pain, nausea, vomiting or bleeding. She does have some residual left sided weakness from prior CVA.    Past Medical History:  Diagnosis Date   Allergy    Anxiety    Breast cancer (HCC)    Depression     Hyperlipidemia    no meds    Hypertension     Past Surgical History:  Procedure Laterality Date   BREAST BIOPSY Left 12/21/2022   US  LT BREAST BX W LOC DEV 1ST LESION IMG BX SPEC US  GUIDE 12/21/2022 [facility]   BREAST BIOPSY Left 12/21/2022   US  LT BREAST BX W LOC DEV EA ADD LESION IMG BX SPEC US  GUIDE 12/21/2022 [facility]   BREAST BIOPSY Right 12/26/2022   US  RT BREAST BX W LOC DEV 1ST LESION IMG BX SPEC US  GUIDE 12/26/2022 [facility]   BREAST BIOPSY Right 12/26/2022   US  RT BREAST BX W LOC DEV EA ADD LESION IMG BX SPEC US  GUIDE 12/26/2022 [facility]   BREAST BIOPSY Right 12/26/2022   MM RT BREAST BX W LOC DEV 1ST LESION IMAGE BX SPEC STEREO GUIDE 12/26/2022 [facility]   DENTAL SURGERY     NSVD     x2   TUBAL LIGATION  1976    Allergies: Patient has no known allergies.  Medications: Prior to Admission medications   Medication Sig Start Date End Date Taking? Authorizing Provider  albuterol  (VENTOLIN  HFA) 108 (90 Base) MCG/ACT inhaler Inhale 2 puffs into the lungs every 4 (four) hours as needed for wheezing or shortness of breath.    [provider]  amLODipine  (NORVASC ) 10 MG tablet Take 1 tablet (10 mg total) by mouth daily. 05/31/20   Cresenzo, John V, MD  aspirin  EC 81 MG EC tablet Take 1 tablet (81 mg total) by mouth daily. Swallow whole.  05/31/20   Cresenzo, Norleen GAILS, MD  busPIRone  (BUSPAR ) 5 MG tablet Take 5 mg by mouth 2 (two) times daily. 11/09/16   [provider]  clopidogrel  (PLAVIX ) 75 MG tablet Take 1 tablet (75 mg total) by mouth daily. 05/31/20   Cresenzo, Norleen GAILS, MD  dexamethasone  (DECADRON ) 4 MG tablet Take 2 tabs by mouth 2 times daily starting day before chemo. Then take 2 tabs daily for 2 days starting day after chemo. Take with food. 01/03/23   Gudena, Vinay, MD  DULoxetine  (CYMBALTA ) 60 MG capsule Take 60 mg by mouth daily.    [provider]  fluticasone  (FLONASE ) 50 MCG/ACT nasal spray Place 1  spray into both nostrils daily. 02/21/20   [provider]  lidocaine -prilocaine  (EMLA ) cream Apply to affected area once 01/03/23   Gudena, Vinay, MD  montelukast  (SINGULAIR ) 10 MG tablet Take 10 mg by mouth at bedtime. 11/26/16   [provider]  nicotine  (NICODERM CQ  - DOSED IN MG/24 HR) 7 mg/24hr patch Place 1 patch (7 mg total) onto the skin daily as needed (Nicotine  cravings). 05/31/20   Cresenzo, Norleen GAILS, MD  ondansetron  (ZOFRAN ) 8 MG tablet Take 1 tablet (8 mg total) by mouth every 8 (eight) hours as needed for nausea or vomiting. Start on the third day after chemotherapy. 01/03/23   Gudena, Vinay, MD  prochlorperazine  (COMPAZINE ) 10 MG tablet Take 1 tablet (10 mg total) by mouth every 6 (six) hours as needed for nausea or vomiting. 01/03/23   Odean Potts, MD  rosuvastatin  (CRESTOR ) 20 MG tablet Take 1 tablet (20 mg total) by mouth daily. 05/31/20   Cresenzo, John V, MD  traZODone  (DESYREL ) 50 MG tablet Take 50 mg by mouth at bedtime. 11/26/16   [provider]     Family History  Problem Relation Age of Onset   Colon cancer Mother 26 - 11   Cancer Father 7 - 55       unknown type   Colon polyps Sister    Esophageal cancer Sister 29 - 61   Lung cancer Sister 93 - 68   Ovarian cancer Sister 49   Colon cancer Brother 34   Breast cancer Niece        dx. <50   Rectal cancer Neg Hx    Stomach cancer Neg Hx    Pancreatic cancer Neg Hx     Social History   Socioeconomic History   Marital status: Divorced    Spouse name: Not on file   Number of children: Not on file   Years of education: Not on file   Highest education level: Not on file  Occupational History   Not on file  Tobacco Use   Smoking status: Every Day   Smokeless tobacco: Never   Tobacco comments:    8 cigs a day   Substance and Sexual Activity   Alcohol use: No   Drug use: No   Sexual activity: Not on file  Other Topics Concern   Not on file  Social History Narrative   Not on file    Social Drivers of Health   Financial Resource Strain: Not at Risk (04/04/2022)   Received from [employer], [employer]    Financial Resource Strain: 1  Food Insecurity: Not at Risk (04/04/2022)   Received from [place], [facility]   Food Insecurity    Food: 1  Transportation Needs: Not at Risk (04/04/2022)   Received from [place], [facility]   Transportation Needs  Transportation: 1  Physical Activity: Not on File (05/04/2021)   Received from [facility], [place]   Physical Activity    Physical Activity: 0  Stress: Not on File (05/04/2021)   Received from [facility], [place]   Stress    Stress: 0  Social Connections: Not on File (09/24/2022)   Received from [facility]   Social Connections    Connectedness: 0     Review of Systems see above  Vital Signs: pending   Advance Care Plan: no documents on file    Physical Exam: awake, answers questions ok, chest- distant BS bilat; heart- RRR; abd-soft,+BS,NT; tr-1+ pretibial edema  Imaging: MM RT BREAST BX W LOC DEV 1ST LESION IMAGE BX SPEC STEREO GUIDE Addendum Date: 12/28/2022 ADDENDUM REPORT: 12/28/2022 13:35 ADDENDUM: Pathology revealed INVASIVE MAMMARY CARCINOMA WITH SIGNET RING MORPHOLOGY of the RIGHT breast, 11 o'clock, 3 cmfn, (ribbon clip). Based on the immunohistochemistry, this likely represents a pleomorphic lobular carcinoma . This was found to be concordant by Dr. Alm Parkins. Pathology revealed INVASIVE MAMMARY CARCINOMA WITH SIGNET RING MORPHOLOGY of the RIGHT breast, 12 o'clock, 3 cmfn, (heart clip). Based on the immunohistochemistry, this likely represents a pleomorphic lobular carcinoma. This was found to be concordant by Dr. Alm Parkins. Pathology revealed LOBULAR NEOPLASIA, ADENOSIS AND MICROCALCIFICATIONS of the RIGHT breast, 12 o'clock, 4 cmfn, (coil clip). This was found to be concordant by Dr. Alm Parkins, with excision recommended. Pathology results were discussed with the patient by telephone. The patient reported doing  well after the biopsies with tenderness at the sites. Post biopsy instructions and care were reviewed and questions were answered. The patient was encouraged to call [facility] for any additional concerns. My direct phone number was provided. The patient has a recent diagnosis of LEFT breast cancer and was referred to [facility] on January 02, 2023. Pathology results reported by Hendricks Benders, RN on 12/28/2022. Electronically Signed   By: Alm Parkins M.D.   On: 12/28/2022 13:35   Result Date: 12/28/2022 CLINICAL DATA:  Patient presents for ultrasound-guided core needle biopsy of 2 adjacent right breast masses and stereotactic core needle biopsy of faint calcifications posterior to the dominant right breast mass. EXAM: ULTRASOUND GUIDED RIGHT BREAST CORE NEEDLE BIOPSY: 2 RIGHT BREAST MASSES BIOPSIED. RIGHT BREAST STEREOTACTIC CORE NEEDLE BIOPSY COMPARISON:  Previous exam(s). PROCEDURE: I met with the patient and we discussed the procedure of ultrasound-guided biopsy, including benefits and alternatives. We discussed the high likelihood of a successful procedure. We discussed the risks of the procedure, including infection, bleeding, tissue injury, clip migration, and inadequate sampling. Informed written consent was given. The usual time-out protocol was performed immediately prior to the procedure. Biopsy #1: 2.7 cm mass at 11 o'clock, 3 cm the nipple. Lesion quadrant: Upper outer quadrant Using sterile technique and 1% Lidocaine  as local anesthetic, under direct ultrasound visualization, a 12 gauge spring-loaded device was used to perform biopsy of the 2.7 cm mass at 11 o'clock using a lateral approach. At the conclusion of the procedure ribbon shaped tissue marker clip was deployed into the biopsy cavity. Biopsy #2: 6 mm mass at 12 o'clock, 3 cm the nipple. Lesion quadrant: Upper outer quadrant Using  sterile technique and 1% Lidocaine  as local anesthetic, under direct ultrasound visualization, a 12 gauge spring-loaded device was used to perform biopsy of the small mass at 12 o'clock using a lateral approach. At the conclusion of the procedure heart shaped tissue  marker clip was deployed into the biopsy cavity. STEREOTACTIC BIOPSY Biopsy #3: Subtle calcifications posterior to the dominant mass, approximately 11 o'clock, 4 cm the nipple. Using sterile technique and 1% Lidocaine  as local anesthetic, under stereotactic guidance, a 9 gauge vacuum assisted device was used to perform core needle biopsy of subtle calcifications in the upper outer quadrant of the right breast using a superior approach. Specimen radiograph was performed showing a few subtle calcifications. Specimens with calcifications are identified for pathology. Lesion quadrant: Upper outer quadrant At the conclusion of the procedure, a coil shaped tissue marker clip was deployed into the biopsy cavity. Follow up 2 view mammogram was performed and dictated separately. IMPRESSION: Ultrasound guided biopsy of a dominant 2.7 cm mass at 11 o'clock and an adjacent 6 mm satellite mass at 12 o'clock. No apparent complications. Stereotactic-guided biopsy of subtle calcifications approximally 1 cm posterior to the dominant 2.7 cm mass at 11 o'clock. No apparent complications. Electronically Signed: By: Alm Parkins M.D. On: 12/26/2022 09:50   US  RT BREAST BX W LOC DEV 1ST LESION IMG BX SPEC US  GUIDE Addendum Date: 12/28/2022 ADDENDUM REPORT: 12/28/2022 13:35 ADDENDUM: Pathology revealed INVASIVE MAMMARY CARCINOMA WITH SIGNET RING MORPHOLOGY of the RIGHT breast, 11 o'clock, 3 cmfn, (ribbon clip). Based on the immunohistochemistry, this likely represents a pleomorphic lobular carcinoma . This was found to be concordant by Dr. Alm Parkins. Pathology revealed INVASIVE MAMMARY CARCINOMA WITH SIGNET RING MORPHOLOGY of the RIGHT breast, 12 o'clock, 3 cmfn,  (heart clip). Based on the immunohistochemistry, this likely represents a pleomorphic lobular carcinoma. This was found to be concordant by Dr. Alm Parkins. Pathology revealed LOBULAR NEOPLASIA, ADENOSIS AND MICROCALCIFICATIONS of the RIGHT breast, 12 o'clock, 4 cmfn, (coil clip). This was found to be concordant by Dr. Alm Parkins, with excision recommended. Pathology results were discussed with the patient by telephone. The patient reported doing well after the biopsies with tenderness at the sites. Post biopsy instructions and care were reviewed and questions were answered. The patient was encouraged to call [facility] for any additional concerns. My direct phone number was provided. The patient has a recent diagnosis of LEFT breast cancer and was referred to [facility] on January 02, 2023. Pathology results reported by Hendricks Benders, RN on 12/28/2022. Electronically Signed   By: Alm Parkins M.D.   On: 12/28/2022 13:35   Result Date: 12/28/2022 CLINICAL DATA:  Patient presents for ultrasound-guided core needle biopsy of 2 adjacent right breast masses and stereotactic core needle biopsy of faint calcifications posterior to the dominant right breast mass. EXAM: ULTRASOUND GUIDED RIGHT BREAST CORE NEEDLE BIOPSY: 2 RIGHT BREAST MASSES BIOPSIED. RIGHT BREAST STEREOTACTIC CORE NEEDLE BIOPSY COMPARISON:  Previous exam(s). PROCEDURE: I met with the patient and we discussed the procedure of ultrasound-guided biopsy, including benefits and alternatives. We discussed the high likelihood of a successful procedure. We discussed the risks of the procedure, including infection, bleeding, tissue injury, clip migration, and inadequate sampling. Informed written consent was given. The usual time-out protocol was performed immediately prior to the procedure. Biopsy #1: 2.7 cm mass at 11 o'clock, 3 cm the nipple. Lesion  quadrant: Upper outer quadrant Using sterile technique and 1% Lidocaine  as local anesthetic, under direct ultrasound visualization, a 12 gauge spring-loaded device was used to perform biopsy of the 2.7 cm mass at 11 o'clock using a lateral approach. At the conclusion of the procedure ribbon shaped tissue marker clip  was deployed into the biopsy cavity. Biopsy #2: 6 mm mass at 12 o'clock, 3 cm the nipple. Lesion quadrant: Upper outer quadrant Using sterile technique and 1% Lidocaine  as local anesthetic, under direct ultrasound visualization, a 12 gauge spring-loaded device was used to perform biopsy of the small mass at 12 o'clock using a lateral approach. At the conclusion of the procedure heart shaped tissue marker clip was deployed into the biopsy cavity. STEREOTACTIC BIOPSY Biopsy #3: Subtle calcifications posterior to the dominant mass, approximately 11 o'clock, 4 cm the nipple. Using sterile technique and 1% Lidocaine  as local anesthetic, under stereotactic guidance, a 9 gauge vacuum assisted device was used to perform core needle biopsy of subtle calcifications in the upper outer quadrant of the right breast using a superior approach. Specimen radiograph was performed showing a few subtle calcifications. Specimens with calcifications are identified for pathology. Lesion quadrant: Upper outer quadrant At the conclusion of the procedure, a coil shaped tissue marker clip was deployed into the biopsy cavity. Follow up 2 view mammogram was performed and dictated separately. IMPRESSION: Ultrasound guided biopsy of a dominant 2.7 cm mass at 11 o'clock and an adjacent 6 mm satellite mass at 12 o'clock. No apparent complications. Stereotactic-guided biopsy of subtle calcifications approximally 1 cm posterior to the dominant 2.7 cm mass at 11 o'clock. No apparent complications. Electronically Signed: By: Alm Parkins M.D. On: 12/26/2022 09:50   US  RT BREAST BX W LOC DEV EA ADD LESION IMG BX SPEC US  GUIDE Addendum  Date: 12/28/2022 ADDENDUM REPORT: 12/28/2022 13:35 ADDENDUM: Pathology revealed INVASIVE MAMMARY CARCINOMA WITH SIGNET RING MORPHOLOGY of the RIGHT breast, 11 o'clock, 3 cmfn, (ribbon clip). Based on the immunohistochemistry, this likely represents a pleomorphic lobular carcinoma . This was found to be concordant by Dr. Alm Parkins. Pathology revealed INVASIVE MAMMARY CARCINOMA WITH SIGNET RING MORPHOLOGY of the RIGHT breast, 12 o'clock, 3 cmfn, (heart clip). Based on the immunohistochemistry, this likely represents a pleomorphic lobular carcinoma. This was found to be concordant by Dr. Alm Parkins. Pathology revealed LOBULAR NEOPLASIA, ADENOSIS AND MICROCALCIFICATIONS of the RIGHT breast, 12 o'clock, 4 cmfn, (coil clip). This was found to be concordant by Dr. Alm Parkins, with excision recommended. Pathology results were discussed with the patient by telephone. The patient reported doing well after the biopsies with tenderness at the sites. Post biopsy instructions and care were reviewed and questions were answered. The patient was encouraged to call [facility] for any additional concerns. My direct phone number was provided. The patient has a recent diagnosis of LEFT breast cancer and was referred to [facility] at [facility] on January 02, 2023. Pathology results reported by Hendricks Benders, RN on 12/28/2022. Electronically Signed   By: Alm Parkins M.D.   On: 12/28/2022 13:35   Result Date: 12/28/2022 CLINICAL DATA:  Patient presents for ultrasound-guided core needle biopsy of 2 adjacent right breast masses and stereotactic core needle biopsy of faint calcifications posterior to the dominant right breast mass. EXAM: ULTRASOUND GUIDED RIGHT BREAST CORE NEEDLE BIOPSY: 2 RIGHT BREAST MASSES BIOPSIED. RIGHT BREAST STEREOTACTIC CORE NEEDLE BIOPSY COMPARISON:  Previous exam(s). PROCEDURE: I met with the patient and we  discussed the procedure of ultrasound-guided biopsy, including benefits and alternatives. We discussed the high likelihood of a successful procedure. We discussed the risks of the procedure, including infection, bleeding, tissue injury, clip migration, and inadequate sampling. Informed written consent was given. The usual time-out protocol was performed immediately  prior to the procedure. Biopsy #1: 2.7 cm mass at 11 o'clock, 3 cm the nipple. Lesion quadrant: Upper outer quadrant Using sterile technique and 1% Lidocaine  as local anesthetic, under direct ultrasound visualization, a 12 gauge spring-loaded device was used to perform biopsy of the 2.7 cm mass at 11 o'clock using a lateral approach. At the conclusion of the procedure ribbon shaped tissue marker clip was deployed into the biopsy cavity. Biopsy #2: 6 mm mass at 12 o'clock, 3 cm the nipple. Lesion quadrant: Upper outer quadrant Using sterile technique and 1% Lidocaine  as local anesthetic, under direct ultrasound visualization, a 12 gauge spring-loaded device was used to perform biopsy of the small mass at 12 o'clock using a lateral approach. At the conclusion of the procedure heart shaped tissue marker clip was deployed into the biopsy cavity. STEREOTACTIC BIOPSY Biopsy #3: Subtle calcifications posterior to the dominant mass, approximately 11 o'clock, 4 cm the nipple. Using sterile technique and 1% Lidocaine  as local anesthetic, under stereotactic guidance, a 9 gauge vacuum assisted device was used to perform core needle biopsy of subtle calcifications in the upper outer quadrant of the right breast using a superior approach. Specimen radiograph was performed showing a few subtle calcifications. Specimens with calcifications are identified for pathology. Lesion quadrant: Upper outer quadrant At the conclusion of the procedure, a coil shaped tissue marker clip was deployed into the biopsy cavity. Follow up 2 view mammogram was performed and dictated  separately. IMPRESSION: Ultrasound guided biopsy of a dominant 2.7 cm mass at 11 o'clock and an adjacent 6 mm satellite mass at 12 o'clock. No apparent complications. Stereotactic-guided biopsy of subtle calcifications approximally 1 cm posterior to the dominant 2.7 cm mass at 11 o'clock. No apparent complications. Electronically Signed: By: Alm Parkins M.D. On: 12/26/2022 09:50   MM CLIP PLACEMENT RIGHT Result Date: 12/26/2022 CLINICAL DATA:  Assess post biopsy marker clip placements following 2 ultrasound-guided core needle biopsies and a stereotactic core needle biopsy of 2 adjacent right breast masses and adjacent right breast calcifications. EXAM: 3D DIAGNOSTIC RIGHT MAMMOGRAM POST ULTRASOUND AND STEREOTACTIC BIOPSY COMPARISON:  Previous exam(s). FINDINGS: 3D Mammographic images were obtained following ultrasound-guided and stereotactic guided biopsy of 2 adjacent right breast masses as well as adjacent right breast calcifications. The ribbon clip lies in the dominant mass in the upper outer quadrant, the heart shaped clip lies medial to this within the expected location of the small satellite mass and the coil shaped post biopsy clip lies posterior and slightly lateral to the dominant mass in the expected location of the subtle adjacent calcifications. IMPRESSION: Appropriate positioning of the ribbon, heart and coil shaped biopsy marking clips at the site of biopsy in the right breast as detailed above. Final Assessment: Post Procedure Mammograms for Marker Placement Electronically Signed   By: Alm Parkins M.D.   On: 12/26/2022 10:04    Labs:  CBC: Recent Labs    01/02/23 1252  WBC 8.9  HGB 14.2  HCT 43.4  PLT 232    COAGS: No results for input(s): INR, APTT in the last 8760 hours.  BMP: Recent Labs    01/02/23 1252  NA 142  K 5.0  CL 111  CO2 27  GLUCOSE 83  BUN 12  CALCIUM  9.7  CREATININE 0.89  GFRNONAA >60    LIVER FUNCTION TESTS: Recent Labs    01/02/23 1252   BILITOT 0.5  AST 21  ALT 16  ALKPHOS 74  PROT 5.8*  ALBUMIN  3.8  TUMOR MARKERS: No results for input(s): AFPTM, CEA, CA199, CHROMGRNA in the last 8760 hours.  Assessment and Plan: Per Dr. Gara progress note on 01/02/23: Donna [name] is a 73 year old with screening mammogram detected bilateral breast masses and calcifications.    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  There is a second mass 0.9 cm adjacent biopsy was benign discordant, 1 lymph node biopsy was positive.  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  Risks and benefits of image guided port-a-catheter placement was discussed with the patient/daughter including, but not limited to bleeding, infection, pneumothorax, or fibrin sheath development and need for additional procedures.  All of the patient's questions were answered, patient is agreeable to proceed. Consent signed and in chart.   Thank you for this interesting consult.  I greatly enjoyed meeting Donna [name] and look forward to participating in their care.  A copy of this report was sent to the requesting provider on this date.  Electronically Signed: Carlin DELENA Griffon, PA-C/Kevin Reine Bristow,PA-C 01/21/2023, 11:11 AM   I spent a total of  25 minutes   in face to face in clinical consultation, greater than 50% of which was counseling/coordinating care for Port-A-Cath placement.

## 2023-01-22 ENCOUNTER — Ambulatory Visit (HOSPITAL_COMMUNITY)
Admission: RE | Admit: 2023-01-22 | Discharge: 2023-01-22 | Disposition: A | Discharge status: Home / Self Care | Payer: 59 | Source: Ambulatory Visit | Attending: Hematology and Oncology

## 2023-01-22 ENCOUNTER — Other Ambulatory Visit: Payer: Self-pay

## 2023-01-22 ENCOUNTER — Ambulatory Visit (HOSPITAL_COMMUNITY)
Admission: RE | Admit: 2023-01-22 | Discharge: 2023-01-22 | Disposition: A | Discharge status: Home / Self Care | Payer: 59 | Source: Ambulatory Visit | Attending: Hematology and Oncology | Admitting: Hematology and Oncology

## 2023-01-22 DIAGNOSIS — Z8673 Personal history of transient ischemic attack (TIA), and cerebral infarction without residual deficits: Secondary | ICD-10-CM | POA: Diagnosis not present

## 2023-01-22 DIAGNOSIS — I10 Essential (primary) hypertension: Secondary | ICD-10-CM | POA: Insufficient documentation

## 2023-01-22 DIAGNOSIS — C50811 Malignant neoplasm of overlapping sites of right female breast: Secondary | ICD-10-CM | POA: Insufficient documentation

## 2023-01-22 DIAGNOSIS — Z171 Estrogen receptor negative status [ER-]: Secondary | ICD-10-CM | POA: Insufficient documentation

## 2023-01-22 DIAGNOSIS — E785 Hyperlipidemia, unspecified: Secondary | ICD-10-CM | POA: Diagnosis not present

## 2023-01-22 DIAGNOSIS — F32A Depression, unspecified: Secondary | ICD-10-CM | POA: Insufficient documentation

## 2023-01-22 DIAGNOSIS — F419 Anxiety disorder, unspecified: Secondary | ICD-10-CM | POA: Insufficient documentation

## 2023-01-22 HISTORY — PX: IR IMAGING GUIDED PORT INSERTION: IMG5740

## 2023-01-22 MED ORDER — MIDAZOLAM HCL 2 MG/2ML IJ SOLN
INTRAMUSCULAR | Status: AC
  Filled 2023-01-22: qty 2

## 2023-01-22 MED ORDER — HEPARIN SOD (PORK) LOCK FLUSH 100 UNIT/ML IV SOLN
500.0000 [IU] | Freq: Once | INTRAVENOUS | Status: AC
  Administered 2023-01-22: 500 [IU] via INTRAVENOUS

## 2023-01-22 MED ORDER — MIDAZOLAM HCL 2 MG/2ML IJ SOLN
INTRAMUSCULAR | Status: AC | PRN
  Administered 2023-01-22 (×4): 1 mg via INTRAVENOUS

## 2023-01-22 MED ORDER — LIDOCAINE-EPINEPHRINE 1 %-1:100000 IJ SOLN
INTRAMUSCULAR | Status: AC
Start: 2023-01-22 — End: ?
  Filled 2023-01-22: qty 1

## 2023-01-22 MED ORDER — HEPARIN SOD (PORK) LOCK FLUSH 100 UNIT/ML IV SOLN
INTRAVENOUS | Status: AC
Start: 2023-01-22 — End: ?
  Filled 2023-01-22: qty 5

## 2023-01-22 MED ORDER — MIDAZOLAM HCL 2 MG/2ML IJ SOLN
INTRAMUSCULAR | Status: AC
Start: 2023-01-22 — End: ?
  Filled 2023-01-22: qty 2

## 2023-01-22 MED ORDER — FENTANYL CITRATE (PF) 100 MCG/2ML IJ SOLN
INTRAMUSCULAR | Status: AC
  Filled 2023-01-22: qty 2

## 2023-01-22 MED ORDER — SODIUM CHLORIDE 0.9 % IV SOLN: INTRAVENOUS | Status: DC

## 2023-01-22 MED ORDER — LIDOCAINE-EPINEPHRINE 1 %-1:100000 IJ SOLN
20.0000 mL | Freq: Once | INTRAMUSCULAR | Status: AC
  Administered 2023-01-22: 20 mL via INTRADERMAL

## 2023-01-22 MED ORDER — FENTANYL CITRATE (PF) 100 MCG/2ML IJ SOLN
INTRAMUSCULAR | Status: AC | PRN
  Administered 2023-01-22 (×2): 50 ug via INTRAVENOUS

## 2023-01-22 NOTE — Procedures (Signed)
 Vascular and Interventional Radiology Procedure Note  Patient: Donna [name] DOB: [birth year] Medical Record Number: [id number] Note Date/Time: 01/22/23 3:22 PM   Performing Physician: Thom Hall, MD Assistant(s): None  Diagnosis: Breast cancer  Procedure: PORT PLACEMENT  Anesthesia: Conscious Sedation Complications: None Estimated Blood Loss: Minimal  Findings:  Successful right-sided port placement, with the tip of the catheter in the proximal right atrium.  Plan: Catheter ready for use.  See detailed procedure note with images in PACS. The patient tolerated the procedure well without incident or complication and was returned to Recovery in stable condition.    Thom Hall, MD [facility]   Pager. [phone] Clinic. [phone]

## 2023-01-22 NOTE — Sedation Documentation (Signed)
 IV infiltrated 1mg  versed and fentanyl given interstitial.

## 2023-01-22 NOTE — Progress Notes (Signed)
 1700 Ice bag given to use prn to right upper neck and upper chest for comfort.

## 2023-01-22 NOTE — Discharge Instructions (Signed)
Discharge Instructions:   Please call Interventional Radiology clinic [phone] with any questions or concerns.  You may remove your dressing and shower tomorrow.  Do not use EMLA / Lidocaine cream for 2 weeks post Port Insertion this will remove the surgical glue.  Moderate Conscious Sedation, Adult, Care After This sheet gives you information about how to care for yourself after your procedure. Your health care provider may also give you more specific instructions. If you have problems or questions, contact your health care provider. What can I expect after the procedure? After the procedure, it is common to have: Sleepiness for several hours. Impaired judgment for several hours. Difficulty with balance. Vomiting if you eat too soon. Follow these instructions at home: For the time period you were told by your health care provider: Rest. Do not participate in activities where you could fall or become injured. Do not drive or use machinery. Do not drink alcohol. Do not take sleeping pills or medicines that cause drowsiness. Do not make important decisions or sign legal documents. Do not take care of children on your own. Eating and drinking  Follow the diet recommended by your health care provider. Drink enough fluid to keep your urine pale yellow. If you vomit: Drink water, juice, or soup when you can drink without vomiting. Make sure you have little or no nausea before eating solid foods. General instructions Take over-the-counter and prescription medicines only as told by your health care provider. Have a responsible adult stay with you for the time you are told. It is important to have someone help care for you until you are awake and alert. Do not smoke. Keep all follow-up visits as told by your health care provider. This is important. Contact a health care provider if: You are still sleepy or having trouble with balance after 24 hours. You feel light-headed. You keep  feeling nauseous or you keep vomiting. You develop a rash. You have a fever. You have redness or swelling around the IV site. Get help right away if: You have trouble breathing. You have new-onset confusion at home. Summary After the procedure, it is common to feel sleepy, have impaired judgment, or feel nauseous if you eat too soon. Rest after you get home. Know the things you should not do after the procedure. Follow the diet recommended by your health care provider and drink enough fluid to keep your urine pale yellow. Get help right away if you have trouble breathing or new-onset confusion at home. This information is not intended to replace advice given to you by your health care provider. Make sure you discuss any questions you have with your health care provider. Document Revised: 05/01/2019 Document Reviewed: 11/27/2018 Elsevier Patient Education  2023 Else[employer] Inc[employer]  Implanted Port Insertion, Care After The following information offers guidance on how to care for yourself after your procedure. Your health care provider may also give you more specific instructions. If you have problems or questions, contact your health care provider. What can I expect after the procedure? After the procedure, it is common to have: Discomfort at the port insertion site. Bruising on the skin over the port. This should improve over 3-4 days. Follow these instructions at home: Port care After your port is placed, you will get a manufacturer's information card. The card has information about your port. Keep this card with you at all times. Take care of the port as told by your health care provider. Ask your health care provider if you or   a family member can get training for taking care of the port at home. A home health care nurse will be be available to help care for the port. Make sure to remember what type of port you have. Incision care     Follow instructions from your health care provider  about how to take care of your port insertion site. Make sure you: Wash your hands with soap and water for at least 20 seconds before and after you change your bandage (dressing). If soap and water are not available, use hand sanitizer. Change your dressing as told by your health care provider. Leave stitches (sutures), skin glue, or adhesive strips in place. These skin closures may need to stay in place for 2 weeks or longer. If adhesive strip edges start to loosen and curl up, you may trim the loose edges. Do not remove adhesive strips completely unless your health care provider tells you to do that. Check your port insertion site every day for signs of infection. Check for: Redness, swelling, or pain. Fluid or blood. Warmth. Pus or a bad smell. Activity Return to your normal activities as told by your health care provider. Ask your health care provider what activities are safe for you. You may have to avoid lifting. Ask your health care provider how much you can safely lift. General instructions Take over-the-counter and prescription medicines only as told by your health care provider. Do not take baths, swim, or use a hot tub until your health care provider approves. Ask your health care provider if you may take showers. You may only be allowed to take sponge baths. If you were given a sedative during the procedure, it can affect you for several hours. Do not drive or operate machinery until your health care provider says that it is safe. Wear a medical alert bracelet in case of an emergency. This will tell any health care providers that you have a port. Keep all follow-up visits. This is important. Contact a health care provider if: You cannot flush your port with saline as directed, or you cannot draw blood from the port. You have a fever or chills. You have redness, swelling, or pain around your port insertion site. You have fluid or blood coming from your port insertion site. Your port  insertion site feels warm to the touch. You have pus or a bad smell coming from the port insertion site. Get help right away if: You have chest pain or shortness of breath. You have bleeding from your port that you cannot control. These symptoms may be an emergency. Get help right away. Call 911. Do not wait to see if the symptoms will go away. Do not drive yourself to the hospital. Summary Take care of the port as told by your health care provider. Keep the manufacturer's information card with you at all times. Change your dressing as told by your health care provider. Contact a health care provider if you have a fever or chills or if you have redness, swelling, or pain around your port insertion site. Keep all follow-up visits. This information is not intended to replace advice given to you by your health care provider. Make sure you discuss any questions you have with your health care provider. Document Revised: 07/05/2020 Document Reviewed: 07/05/2020 Elsevier Patient Education  2023 Elsevier Inc.  

## 2023-01-23 ENCOUNTER — Inpatient Hospital Stay: Payer: 59

## 2023-01-23 ENCOUNTER — Inpatient Hospital Stay: Payer: 59 | Attending: Hematology and Oncology

## 2023-01-23 ENCOUNTER — Inpatient Hospital Stay: Payer: 59 | Admitting: Pharmacist

## 2023-01-23 ENCOUNTER — Ambulatory Visit (HOSPITAL_COMMUNITY)
Admission: RE | Admit: 2023-01-23 | Discharge: 2023-01-23 | Disposition: A | Discharge status: Home / Self Care | Payer: 59 | Source: Ambulatory Visit | Attending: Hematology and Oncology | Admitting: Hematology and Oncology

## 2023-01-23 VITALS — BP 155/54 | HR 87 | Temp 97.3°F | Resp 17 | Wt 169.6 lb

## 2023-01-23 DIAGNOSIS — Z5189 Encounter for other specified aftercare: Secondary | ICD-10-CM | POA: Insufficient documentation

## 2023-01-23 DIAGNOSIS — C50811 Malignant neoplasm of overlapping sites of right female breast: Secondary | ICD-10-CM | POA: Diagnosis not present

## 2023-01-23 DIAGNOSIS — Z170 Estrogen receptor positive status [ER+]: Secondary | ICD-10-CM | POA: Insufficient documentation

## 2023-01-23 DIAGNOSIS — C50412 Malignant neoplasm of upper-outer quadrant of left female breast: Secondary | ICD-10-CM | POA: Insufficient documentation

## 2023-01-23 DIAGNOSIS — Z5112 Encounter for antineoplastic immunotherapy: Secondary | ICD-10-CM | POA: Insufficient documentation

## 2023-01-23 DIAGNOSIS — C50411 Malignant neoplasm of upper-outer quadrant of right female breast: Secondary | ICD-10-CM | POA: Diagnosis not present

## 2023-01-23 DIAGNOSIS — Z5111 Encounter for antineoplastic chemotherapy: Secondary | ICD-10-CM | POA: Insufficient documentation

## 2023-01-23 DIAGNOSIS — I358 Other nonrheumatic aortic valve disorders: Secondary | ICD-10-CM | POA: Insufficient documentation

## 2023-01-23 DIAGNOSIS — Z79899 Other long term (current) drug therapy: Secondary | ICD-10-CM | POA: Insufficient documentation

## 2023-01-23 DIAGNOSIS — I10 Essential (primary) hypertension: Secondary | ICD-10-CM | POA: Insufficient documentation

## 2023-01-23 DIAGNOSIS — Z8673 Personal history of transient ischemic attack (TIA), and cerebral infarction without residual deficits: Secondary | ICD-10-CM | POA: Insufficient documentation

## 2023-01-23 DIAGNOSIS — Z171 Estrogen receptor negative status [ER-]: Secondary | ICD-10-CM | POA: Insufficient documentation

## 2023-01-23 DIAGNOSIS — Z7982 Long term (current) use of aspirin: Secondary | ICD-10-CM | POA: Insufficient documentation

## 2023-01-23 DIAGNOSIS — Z1731 Human epidermal growth factor receptor 2 positive status: Secondary | ICD-10-CM | POA: Diagnosis not present

## 2023-01-23 DIAGNOSIS — Z01818 Encounter for other preprocedural examination: Secondary | ICD-10-CM | POA: Diagnosis present

## 2023-01-23 LAB — ECHOCARDIOGRAM COMPLETE
AR max vel: 2.12 cm2
AV Area VTI: 2.09 cm2
AV Area mean vel: 2.13 cm2
AV Mean grad: 4 mm[Hg]
AV Peak grad: 6.5 mm[Hg]
Ao pk vel: 1.27 m/s
Area-P 1/2: 3.74 cm2
S' Lateral: 2.3 cm

## 2023-01-23 NOTE — Progress Notes (Signed)
  [facility]       Telephone: [phone]?Fax: [phone]   Oncology Clinical Pharmacist Practitioner Initial Assessment  Donna [name] is a 73 y.o. female with a diagnosis of breast cancer. They were contacted today via in-person visit.  Indication/Regimen TCHP: Trastuzumab  (Herceptin ) and pertuzumab  (Perjeta ) and docetaxel  (Taxotere ) and carboplatin  (Paraplatin ) are being used appropriately for treatment of breast cancer by Dr. Vinay Gudena.      Wt Readings from Last 1 Encounters:  01/22/23 165 lb (74.8 kg)    Estimated body surface area is 1.91 meters squared as calculated from the following:   Height as of 01/22/23: 5' 9 (1.753 m).   Weight as of 01/22/23: 165 lb (74.8 kg).  The dosing regimen is every 21 days for 6 cycles  Trastuzumab  (8 mg/kg load, 6 mg/kg maintenance) on Day 1 Pertuzumab  (840 mg load, 420 mg maintenance) on Day 1 Docetaxel  (75 mg/m2) on Day 1 Carboplatin  (AUC 6) on Day 1 Pegfilgrastim  (6 mg) on Day 3  Dose Modifications Docetaxel : reduced to 60 mg/m2 per Dr. Odean  Carboplatin : reduced to AUC 4 per Dr. Odean  Allergies No Known Allergies  Vitals: No labs were done today for this chemotherapy education visit     01/23/2023    4:01 PM 01/22/2023    5:30 PM 01/22/2023    5:00 PM  Oncology Vitals  Weight 76.93 kg    Weight (lbs) 169 lbs 10 oz    BMI 25.05 kg/m2    Temp 97.3 F (36.3 C) 98 F (36.7 C)   Pulse Rate 87  80  BP 155/54 133/62 143/75  Resp 17 16 16   SpO2 96 % 95 % 94 %  BSA (m2) 1.93 m2       Laboratory Data    Latest Ref Rng & Units 01/02/2023   12:52 PM 05/29/2020    6:22 AM 05/26/2020    3:19 AM  CBC EXTENDED  WBC 4.0 - 10.5 K/uL 8.9  10.3  10.6   RBC 3.87 - 5.11 MIL/uL 4.54  4.44  4.38   Hemoglobin 12.0 - 15.0 g/dL 85.7  85.4  85.5   HCT 36.0 - 46.0 % 43.4  43.4  43.1   Platelets 150 - 400 K/uL 232  234  247   NEUT# 1.7 - 7.7 K/uL 5.5     Lymph# 0.7 - 4.0 K/uL 2.6          Latest Ref Rng &  Units 01/02/2023   12:52 PM 05/26/2020    3:19 AM 05/25/2020    1:49 PM  CMP  Glucose 70 - 99 mg/dL 83  89  88   BUN 8 - 23 mg/dL 12  17  22    Creatinine 0.44 - 1.00 mg/dL 9.10  9.16  9.09   Sodium 135 - 145 mmol/L 142  138  140   Potassium 3.5 - 5.1 mmol/L 5.0  3.7  4.6   Chloride 98 - 111 mmol/L 111  110  110   CO2 22 - 32 mmol/L 27  21    Calcium  8.9 - 10.3 mg/dL 9.7  9.1    Total Protein 6.5 - 8.1 g/dL 5.8     Total Bilirubin <1.2 mg/dL 0.5     Alkaline Phos 38 - 126 U/L 74     AST 15 - 41 U/L 21     ALT 0 - 44 U/L 16      Contraindications Contraindications were reviewed? Yes Contraindications to therapy were  identified? No   Safety Precautions (written information also provided) The following safety precautions for the use of TCHP were reviewed:  Decreased hemoglobin, part of the red blood cells that carry iron and oxygen Decreased platelet count and increased risk of bleeding Decreased white blood cells (WBCs) and increased risk for infection Fever: reviewed the importance of having a thermometer and the Centers for Disease Control and Prevention (CDC) definition of fever which is 100.108F (38C) or higher. Patient should call 24/7 triage at [phone] if experiencing a fever or any other symptoms Hair Loss Muscle or joint pain or weakness Fatigue Nausea or vomiting Mouth sores Diarrhea Irregular menses (if applicable) Peripheral neuropathy: numbness or tingling in hands and feet Fluid retention or swelling (edema) Nail Changes Rash or itchy skin Fluid retention or swelling (edema) Taste changes Changes in kidney function Changes in electrolytes and other laboratory values (low potassium, low magnesium) Cardiotoxicity from trastuzumab  Infusion reactions Pneumonitis from trastuzumab  Docetaxel  irritating veins Liver toxicity from docetaxel  Docetaxel  can cause eye pain, blurred vision, tearing, and light sensitivity Handling body fluids and waste Intimacy,  sexual activity, contraception, fertility  Medication Reconciliation Current Outpatient Medications  Medication Sig Dispense Refill   albuterol  (VENTOLIN  HFA) 108 (90 Base) MCG/ACT inhaler Inhale 2 puffs into the lungs every 4 (four) hours as needed for wheezing or shortness of breath.     amLODipine  (NORVASC ) 10 MG tablet Take 1 tablet (10 mg total) by mouth daily.     aspirin  EC 81 MG EC tablet Take 1 tablet (81 mg total) by mouth daily. Swallow whole. 30 tablet 11   busPIRone  (BUSPAR ) 5 MG tablet Take 5 mg by mouth 2 (two) times daily.  0   clopidogrel  (PLAVIX ) 75 MG tablet Take 1 tablet (75 mg total) by mouth daily.     dexamethasone  (DECADRON ) 4 MG tablet Take 2 tabs by mouth 2 times daily starting day before chemo. Then take 2 tabs daily for 2 days starting day after chemo. Take with food. 30 tablet 1   DULoxetine  (CYMBALTA ) 60 MG capsule Take 60 mg by mouth daily.     fluticasone  (FLONASE ) 50 MCG/ACT nasal spray Place 1 spray into both nostrils daily.     lidocaine -prilocaine  (EMLA ) cream Apply to affected area once 30 g 3   montelukast  (SINGULAIR ) 10 MG tablet Take 10 mg by mouth at bedtime.  0   nicotine  (NICODERM CQ  - DOSED IN MG/24 HR) 7 mg/24hr patch Place 1 patch (7 mg total) onto the skin daily as needed (Nicotine  cravings). 28 patch 0   ondansetron  (ZOFRAN ) 8 MG tablet Take 1 tablet (8 mg total) by mouth every 8 (eight) hours as needed for nausea or vomiting. Start on the third day after chemotherapy. 30 tablet 1   prochlorperazine  (COMPAZINE ) 10 MG tablet Take 1 tablet (10 mg total) by mouth every 6 (six) hours as needed for nausea or vomiting. 30 tablet 1   rosuvastatin  (CRESTOR ) 20 MG tablet Take 1 tablet (20 mg total) by mouth daily.     traZODone  (DESYREL ) 50 MG tablet Take 50 mg by mouth at bedtime.  0   No current facility-administered medications for this visit.   Medication reconciliation is based on the patient's most recent medication list in the electronic medical  record (EMR) including herbal products and OTC medications.   The patient's medication list was reviewed today with the patient? Yes   Drug-drug interactions (DDIs) DDIs were evaluated? Yes Significant DDIs identified?  Potential for serotonin syndrome  due to buspirone , duloxetine , and trazodone . Information given  Drug-Food Interactions Drug-food interactions were evaluated? Yes Drug-food interactions identified? No   Follow-up Plan  Treatment start date: 01/25/23 Port placement date: 01/24/23 ECHO date: 01/23/23 We reviewed the prescriptions, premedications, and treatment regimen with the patient. Possible side effects of the treatment regimen were reviewed and management strategies were discussed.  Can use loperamide as needed for diarrhea, loratadine as needed for G-CSF bone pain, and Senna-S as needed for constipation.  Reviewed potential DDIs including serotonin syndrome. Gave handout for symptoms to watch out for Clinical pharmacy will assist Dr. Vinay Gudena and Isadora Montz on an as needed basis going forward  Donna [name] participated in the discussion, expressed understanding, and voiced agreement with the above plan. All questions were answered to her satisfaction. The patient was advised to contact the clinic at [phone] with any questions or concerns prior to her return visit.   I spent 60 minutes assessing the patient.  Cobe Viney A. Lucila, PharmD, BCOP, CPP  Norleen DELENA Lucila, RPH-CPP, 01/23/2023 3:44 PM  **Disclaimer: This note was dictated with voice recognition software. Similar sounding words can inadvertently be transcribed and this note may contain transcription errors which may not have been corrected upon publication of note.**

## 2023-01-24 ENCOUNTER — Inpatient Hospital Stay: Payer: 59

## 2023-01-24 ENCOUNTER — Inpatient Hospital Stay (HOSPITAL_BASED_OUTPATIENT_CLINIC_OR_DEPARTMENT_OTHER): Payer: 59 | Admitting: Hematology and Oncology

## 2023-01-24 ENCOUNTER — Encounter: Payer: Self-pay | Admitting: *Deleted

## 2023-01-24 ENCOUNTER — Other Ambulatory Visit: Payer: Self-pay

## 2023-01-24 VITALS — BP 173/77 | HR 83 | Temp 98.1°F | Resp 18 | Ht 69.0 in | Wt 169.2 lb

## 2023-01-24 DIAGNOSIS — Z95828 Presence of other vascular implants and grafts: Secondary | ICD-10-CM | POA: Insufficient documentation

## 2023-01-24 DIAGNOSIS — C50811 Malignant neoplasm of overlapping sites of right female breast: Secondary | ICD-10-CM

## 2023-01-24 DIAGNOSIS — C50412 Malignant neoplasm of upper-outer quadrant of left female breast: Secondary | ICD-10-CM

## 2023-01-24 DIAGNOSIS — Z171 Estrogen receptor negative status [ER-]: Secondary | ICD-10-CM | POA: Diagnosis not present

## 2023-01-24 DIAGNOSIS — Z5112 Encounter for antineoplastic immunotherapy: Secondary | ICD-10-CM | POA: Diagnosis not present

## 2023-01-24 LAB — CMP (CANCER CENTER ONLY)
ALT: 14 U/L (ref 0–44)
AST: 19 U/L (ref 15–41)
Albumin: 3.9 g/dL (ref 3.5–5.0)
Alkaline Phosphatase: 77 U/L (ref 38–126)
Anion gap: 4 — ABNORMAL LOW (ref 5–15)
BUN: 11 mg/dL (ref 8–23)
CO2: 27 mmol/L (ref 22–32)
Calcium: 9.4 mg/dL (ref 8.9–10.3)
Chloride: 112 mmol/L — ABNORMAL HIGH (ref 98–111)
Creatinine: 0.8 mg/dL (ref 0.44–1.00)
GFR, Estimated: >60 mL/min (ref >60)
Glucose, Bld: 83 mg/dL (ref 70–99)
Potassium: 3.8 mmol/L (ref 3.5–5.1)
Sodium: 143 mmol/L (ref 135–145)
Total Bilirubin: 0.5 mg/dL (ref 0.0–1.2)
Total Protein: 6.3 g/dL — ABNORMAL LOW (ref 6.5–8.1)

## 2023-01-24 LAB — CBC WITH DIFFERENTIAL (CANCER CENTER ONLY)
Abs Immature Granulocytes: 0.02 10*3/uL (ref 0.00–0.07)
Basophils Absolute: 0 10*3/uL (ref 0.0–0.1)
Basophils Relative: 0 %
Eosinophils Absolute: 0.3 10*3/uL (ref 0.0–0.5)
Eosinophils Relative: 3 %
HCT: 41.5 % (ref 36.0–46.0)
Hemoglobin: 13.9 g/dL (ref 12.0–15.0)
Immature Granulocytes: 0 %
Lymphocytes Relative: 26 %
Lymphs Abs: 2.3 10*3/uL (ref 0.7–4.0)
MCH: 32 pg (ref 26.0–34.0)
MCHC: 33.5 g/dL (ref 30.0–36.0)
MCV: 95.4 fL (ref 80.0–100.0)
Monocytes Absolute: 0.4 10*3/uL (ref 0.1–1.0)
Monocytes Relative: 5 %
Neutro Abs: 6.1 10*3/uL (ref 1.7–7.7)
Neutrophils Relative %: 66 %
Platelet Count: 218 10*3/uL (ref 150–400)
RBC: 4.35 MIL/uL (ref 3.87–5.11)
RDW: 14.1 % (ref 11.5–15.5)
WBC Count: 9.2 10*3/uL (ref 4.0–10.5)
nRBC: 0 % (ref 0.0–0.2)

## 2023-01-24 MED ORDER — SODIUM CHLORIDE 0.9% FLUSH
10.0000 mL | Freq: Once | INTRAVENOUS | Status: AC
  Administered 2023-01-24: 10 mL

## 2023-01-24 MED ORDER — ZOLPIDEM TARTRATE 5 MG PO TABS: 5.0000 mg | ORAL_TABLET | Freq: Every evening | ORAL | 1 refills | Status: DC | PRN

## 2023-01-24 MED ORDER — HEPARIN SOD (PORK) LOCK FLUSH 100 UNIT/ML IV SOLN
500.0000 [IU] | Freq: Once | INTRAVENOUS | Status: AC
  Administered 2023-01-24: 500 [IU]

## 2023-01-24 MED FILL — Fosaprepitant Dimeglumine For IV Infusion 150 MG (Base Eq): INTRAVENOUS | Qty: 5 | Status: AC

## 2023-01-24 NOTE — Progress Notes (Signed)
 Patient Care Team: Care, Janit Griffins [facility] as PCP - General (Family Medicine) Tyree Nanetta SAILOR, RN as Oncology Nurse Navigator Glean Stephane BROCKS, RN as Oncology Nurse Navigator Curvin Deward MOULD, MD as Consulting Physician (General Surgery) Odean Potts, MD as Consulting Physician (Hematology and Oncology) Dewey Rush, MD as Consulting Physician (Radiation Oncology)  DIAGNOSIS:  Encounter Diagnosis  Name Primary?   Malignant neoplasm of overlapping sites of right breast in female, estrogen receptor negative (HCC) Yes    SUMMARY OF ONCOLOGIC HISTORY: Oncology History  Malignant neoplasm of overlapping sites of right breast in female, estrogen receptor negative (HCC)  12/31/2022 Initial Diagnosis   Malignant neoplasm of overlapping sites of right breast in female, estrogen receptor negative (HCC)   01/01/2023 Cancer Staging   Staging form: Breast, AJCC 8th Edition - Clinical stage from 01/01/2023: Stage IIA (cT2, cN0, cM0, G3, ER-, PR-, HER2+) - Signed by Odean Potts, MD on 01/02/2023 Stage prefix: Initial diagnosis Method of lymph node assessment: Clinical Histologic grading system: 3 grade system   01/23/2023 -  Chemotherapy   Patient is on Treatment Plan : BREAST  Docetaxel  + Carboplatin  + Trastuzumab  + Pertuzumab   (TCHP) q21d       Genetic Testing   [facility] Cancer[facility]+RNA was Negative. Report date is 01/16/2023.   The [facility] CancerNext+RNAinsight Panel includes sequencing, rearrangement analysis, and RNA analysis for the following 39 genes: APC, ATM, BAP1, BARD1, BMPR1A, BRCA1, BRCA2, BRIP1, CDH1, CDKN2A, CHEK2, FH, FLCN, MET, MLH1, MSH2, MSH6, MUTYH, NF1, NTHL1, PALB2, PMS2, PTEN, RAD51C, RAD51D, SMAD4, STK11, TP53, TSC1, TSC2, and VHL (sequencing and deletion/duplication); AXIN2, HOXB13, MBD4, MSH3, POLD1 and POLE (sequencing only); EPCAM and GREM1 (deletion/duplication only).    Malignant neoplasm of upper-outer quadrant of left breast in female, estrogen receptor positive  (HCC)  12/31/2022 Initial Diagnosis   Malignant neoplasm of upper-outer quadrant of left breast in female, estrogen receptor positive (HCC)   01/01/2023 Cancer Staging   Staging form: Breast, AJCC 8th Edition - Clinical: Stage IIB (cT2, cN1(f), cM0, G3, ER+, PR+, HER2-) - Signed by Lanell Donald Stagger, PA-C on 01/01/2023 Method of lymph node assessment: Core biopsy Histologic grading system: 3 grade system   01/23/2023 -  Chemotherapy   Patient is on Treatment Plan : BREAST  Docetaxel  + Carboplatin  + Trastuzumab  + Pertuzumab   (TCHP) q21d       Genetic Testing   [facility] CancerNext+RNA was Negative. Report date is 01/16/2023.   The [facility] CancerNext+RNAinsight Panel includes sequencing, rearrangement analysis, and RNA analysis for the following 39 genes: APC, ATM, BAP1, BARD1, BMPR1A, BRCA1, BRCA2, BRIP1, CDH1, CDKN2A, CHEK2, FH, FLCN, MET, MLH1, MSH2, MSH6, MUTYH, NF1, NTHL1, PALB2, PMS2, PTEN, RAD51C, RAD51D, SMAD4, STK11, TP53, TSC1, TSC2, and VHL (sequencing and deletion/duplication); AXIN2, HOXB13, MBD4, MSH3, POLD1 and POLE (sequencing only); EPCAM and GREM1 (deletion/duplication only).      CHIEF COMPLIANT: Patient to start chemotherapy with [facility]  tomorrow  HISTORY OF PRESENT ILLNESS: Donna [name] 73 year old with above-mentioned history of HER2 positive breast cancer who is here to discuss starting chemotherapy tomorrow with [facility]eta .  She reports to be ready to start her treatment tomorrow.  She had an echocardiogram that showed an ejection fraction of 60%.  She has an MRI scheduled for 01/31/2023.       ALLERGIES:  has no known allergies.  MEDICATIONS:  Current Outpatient Medications  Medication Sig Dispense Refill   zolpidem  (AMBIEN ) 5 MG tablet Take 1 tablet (5 mg total) by mouth at bedtime as needed for sleep.  15 tablet 1   albuterol  (VENTOLIN  HFA) 108 (90 Base) MCG/ACT inhaler Inhale 2 puffs into the lungs every 4 (four) hours as needed for wheezing or shortness of  breath.     amLODipine  (NORVASC ) 10 MG tablet Take 1 tablet (10 mg total) by mouth daily.     aspirin  EC 81 MG EC tablet Take 1 tablet (81 mg total) by mouth daily. Swallow whole. 30 tablet 11   busPIRone  (BUSPAR ) 5 MG tablet Take 5 mg by mouth 2 (two) times daily.  0   clopidogrel  (PLAVIX ) 75 MG tablet Take 1 tablet (75 mg total) by mouth daily.     dexamethasone  (DECADRON ) 4 MG tablet Take 2 tabs by mouth 2 times daily starting day before chemo. Then take 2 tabs daily for 2 days starting day after chemo. Take with food. (Patient not taking: Reported on 01/23/2023) 30 tablet 1   DULoxetine  (CYMBALTA ) 60 MG capsule Take 60 mg by mouth daily.     fluticasone  (FLONASE ) 50 MCG/ACT nasal spray Place 1 spray into both nostrils daily.     lidocaine -prilocaine  (EMLA ) cream Apply to affected area once (Patient not taking: Reported on 01/23/2023) 30 g 3   montelukast  (SINGULAIR ) 10 MG tablet Take 10 mg by mouth at bedtime.  0   Multiple Vitamins-Calcium  (ONE-A-DAY WOMENS FORMULA) TABS Take by mouth daily.     nicotine  (NICODERM CQ  - DOSED IN MG/24 HR) 7 mg/24hr patch Place 1 patch (7 mg total) onto the skin daily as needed (Nicotine  cravings). 28 patch 0   ondansetron  (ZOFRAN ) 8 MG tablet Take 1 tablet (8 mg total) by mouth every 8 (eight) hours as needed for nausea or vomiting. Start on the third day after chemotherapy. (Patient not taking: Reported on 01/23/2023) 30 tablet 1   prochlorperazine  (COMPAZINE ) 10 MG tablet Take 1 tablet (10 mg total) by mouth every 6 (six) hours as needed for nausea or vomiting. (Patient not taking: Reported on 01/23/2023) 30 tablet 1   rosuvastatin  (CRESTOR ) 20 MG tablet Take 1 tablet (20 mg total) by mouth daily.     traZODone  (DESYREL ) 50 MG tablet Take 50 mg by mouth at bedtime.  0   No current facility-administered medications for this visit.    PHYSICAL EXAMINATION: ECOG PERFORMANCE STATUS: 1 - Symptomatic but completely ambulatory  Vitals:   01/24/23 1444 01/24/23 1445   BP: (!) 187/77 (!) 173/77  Pulse: 83   Resp: 18   Temp: 98.1 F (36.7 C)   SpO2: 99%    Filed Weights   01/24/23 1444  Weight: 169 lb 3 oz (76.7 kg)      LABORATORY DATA:  I have reviewed the data as listed    Latest Ref Rng & Units 01/24/2023    2:13 PM 01/02/2023   12:52 PM 05/26/2020    3:19 AM  CMP  Glucose 70 - 99 mg/dL 83  83  89   BUN 8 - 23 mg/dL 11  12  17    Creatinine 0.44 - 1.00 mg/dL 9.19  9.10  9.16   Sodium 135 - 145 mmol/L 143  142  138   Potassium 3.5 - 5.1 mmol/L 3.8  5.0  3.7   Chloride 98 - 111 mmol/L 112  111  110   CO2 22 - 32 mmol/L 27  27  21    Calcium  8.9 - 10.3 mg/dL 9.4  9.7  9.1   Total Protein 6.5 - 8.1 g/dL 6.3  5.8    Total Bilirubin 0.0 -  1.2 mg/dL 0.5  0.5    Alkaline Phos 38 - 126 U/L 77  74    AST 15 - 41 U/L 19  21    ALT 0 - 44 U/L 14  16      Lab Results  Component Value Date   WBC 9.2 01/24/2023   HGB 13.9 01/24/2023   HCT 41.5 01/24/2023   MCV 95.4 01/24/2023   PLT 218 01/24/2023   NEUTROABS 6.1 01/24/2023    ASSESSMENT & PLAN:  Malignant neoplasm of overlapping sites of right breast in female, estrogen receptor negative (HCC) 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 Echocardiogram 01/23/2023: EF 60 to 65% We are starting her at a slightly lower dose to ensure tolerance to chemotherapy. Return to clinic in 1 week for toxicity check     No orders of the defined types were placed in this encounter.  The patient has a good understanding of the overall plan. she agrees with it. she will call with any problems that may develop before the next visit here. Total time spent: 30 mins including face to face time and time spent for planning, charting and co-ordination of care   Donna MARLA Chad, MD 01/24/23

## 2023-01-24 NOTE — Assessment & Plan Note (Signed)
 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 Echocardiogram 01/23/2023: EF 60 to 65% Return to clinic in 1 week for toxicity check

## 2023-01-25 ENCOUNTER — Inpatient Hospital Stay: Payer: 59

## 2023-01-25 ENCOUNTER — Other Ambulatory Visit: Payer: Self-pay | Admitting: *Deleted

## 2023-01-25 ENCOUNTER — Ambulatory Visit: Payer: 59 | Admitting: Dietician

## 2023-01-25 VITALS — BP 147/70 | HR 96 | Temp 98.5°F | Resp 16

## 2023-01-25 DIAGNOSIS — Z5112 Encounter for antineoplastic immunotherapy: Secondary | ICD-10-CM | POA: Diagnosis not present

## 2023-01-25 DIAGNOSIS — Z171 Estrogen receptor negative status [ER-]: Secondary | ICD-10-CM

## 2023-01-25 DIAGNOSIS — C50412 Malignant neoplasm of upper-outer quadrant of left female breast: Secondary | ICD-10-CM

## 2023-01-25 MED ORDER — ACETAMINOPHEN 325 MG PO TABS
650.0000 mg | ORAL_TABLET | Freq: Once | ORAL | Status: AC
  Administered 2023-01-25: 650 mg via ORAL
  Filled 2023-01-25: qty 2

## 2023-01-25 MED ORDER — CARBOPLATIN CHEMO INJECTION 450 MG/45ML
350.4000 mg | Freq: Once | INTRAVENOUS | Status: AC
  Administered 2023-01-25: 350 mg via INTRAVENOUS
  Filled 2023-01-25: qty 35

## 2023-01-25 MED ORDER — DIPHENHYDRAMINE HCL 25 MG PO CAPS
50.0000 mg | ORAL_CAPSULE | Freq: Once | ORAL | Status: AC
  Administered 2023-01-25: 50 mg via ORAL
  Filled 2023-01-25: qty 2

## 2023-01-25 MED ORDER — SODIUM CHLORIDE 0.9 % IV SOLN
150.0000 mg | Freq: Once | INTRAVENOUS | Status: AC
  Administered 2023-01-25: 150 mg via INTRAVENOUS
  Filled 2023-01-25: qty 150

## 2023-01-25 MED ORDER — SODIUM CHLORIDE 0.9 % IV SOLN: INTRAVENOUS | Status: DC

## 2023-01-25 MED ORDER — HEPARIN SOD (PORK) LOCK FLUSH 100 UNIT/ML IV SOLN: 500.0000 [IU] | Freq: Once | INTRAVENOUS | Status: DC | PRN

## 2023-01-25 MED ORDER — DOCETAXEL CHEMO INJECTION 160 MG/16ML
60.0000 mg/m2 | Freq: Once | INTRAVENOUS | Status: AC
  Administered 2023-01-25: 116 mg via INTRAVENOUS
  Filled 2023-01-25: qty 11.6

## 2023-01-25 MED ORDER — SODIUM CHLORIDE 0.9% FLUSH: 10.0000 mL | INTRAVENOUS | Status: DC | PRN

## 2023-01-25 MED ORDER — SODIUM CHLORIDE 0.9 % IV SOLN
8.0000 mg/kg | Freq: Once | INTRAVENOUS | Status: AC
  Administered 2023-01-25: 600 mg via INTRAVENOUS
  Filled 2023-01-25: qty 28.57

## 2023-01-25 MED ORDER — PALONOSETRON HCL INJECTION 0.25 MG/5ML
0.2500 mg | Freq: Once | INTRAVENOUS | Status: AC
Start: 2023-01-25 — End: 2023-01-25
  Administered 2023-01-25: 0.25 mg via INTRAVENOUS
  Filled 2023-01-25: qty 5

## 2023-01-25 MED ORDER — DEXAMETHASONE SODIUM PHOSPHATE 10 MG/ML IJ SOLN
10.0000 mg | Freq: Once | INTRAMUSCULAR | Status: AC
  Administered 2023-01-25: 10 mg via INTRAVENOUS
  Filled 2023-01-25: qty 1

## 2023-01-25 MED ORDER — SODIUM CHLORIDE 0.9 % IV SOLN
420.0000 mg | Freq: Once | INTRAVENOUS | Status: AC
  Administered 2023-01-25: 420 mg via INTRAVENOUS
  Filled 2023-01-25: qty 14

## 2023-01-25 NOTE — Patient Instructions (Signed)
 [facility] - A DEPT OF [facility]  Discharge Instructions: Thank you for choosing [facility] to provide your oncology and hematology care.   If you have a lab appointment with the Cancer Center, please go directly to the Cancer Center and check in at the registration area.   Wear comfortable clothing and clothing appropriate for easy access to any Portacath or PICC line.   We strive to give you quality time with your provider. You may need to reschedule your appointment if you arrive late (15 or more minutes).  Arriving late affects you and other patients whose appointments are after yours.  Also, if you miss three or more appointments without notifying the office, you may be dismissed from the clinic at the provider's discretion.      For prescription refill requests, have your pharmacy contact our office and allow 72 hours for refills to be completed.    Today you received the following chemotherapy and/or immunotherapy agents herceptin , perjeta , docetaxel , carboplatin       To help prevent nausea and vomiting after your treatment, we encourage you to take your nausea medication as directed.  BELOW ARE SYMPTOMS THAT SHOULD BE REPORTED IMMEDIATELY: *FEVER GREATER THAN 100.4 F (38 C) OR HIGHER *CHILLS OR SWEATING *NAUSEA AND VOMITING THAT IS NOT CONTROLLED WITH YOUR NAUSEA MEDICATION *UNUSUAL SHORTNESS OF BREATH *UNUSUAL BRUISING OR BLEEDING *URINARY PROBLEMS (pain or burning when urinating, or frequent urination) *BOWEL PROBLEMS (unusual diarrhea, constipation, pain near the anus) TENDERNESS IN MOUTH AND THROAT WITH OR WITHOUT PRESENCE OF ULCERS (sore throat, sores in mouth, or a toothache) UNUSUAL RASH, SWELLING OR PAIN  UNUSUAL VAGINAL DISCHARGE OR ITCHING   Items with * indicate a potential emergency and should be followed up as soon as possible or go to the Emergency Department if any problems should occur.  Please show the  CHEMOTHERAPY ALERT CARD or IMMUNOTHERAPY ALERT CARD at check-in to the Emergency Department and triage nurse.  Should you have questions after your visit or need to cancel or reschedule your appointment, please contact [facility] - A DEPT OF [facility]  Dept: [phone]  and follow the prompts.  Office hours are 8:00 a.m. to 4:30 p.m. Monday - Friday. Please note that voicemails left after 4:00 p.m. may not be returned until the following business day.  We are closed weekends and major holidays. You have access to a nurse at all times for urgent questions. Please call the main number to the clinic Dept: [phone] and follow the prompts.   For any non-urgent questions, you may also contact your provider using MyChart. We now offer e-Visits for anyone 51 and older to request care online for non-urgent symptoms. For details visit [email or web address].   Also download the MyChart app! Go to the app store, search MyChart, open the app, select [place], and log in with your MyChart username and password.

## 2023-01-25 NOTE — Progress Notes (Signed)
 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 She is receiving TCHP q21d (first 1/10). Patient is under the care of Dr. Odean.   Past medical history includes stroke, HTN, nicotine  dependence  Met with patient in infusion. Patient reports positive attitude at baseline and planning to greet her journey with the same. She has a fairly good appetite. Patient does not have a regular eating pattern. She stopped eating meat ~4 years ago and really enjoys fruits and vegetables. Patient does eat fish. She states Dr. Odean recommend drinking Ensure for added protein. Patient is unable to afford this. Patient drinking ~68 ounces of water. Patient denies nutrition impact symptoms at this time.   Nutrition Focused Physical Exam: deferred    Medications: amlodipine , buspar , plavix , cymbalta , MVI, compazine , crestor , trazodone , ambien    Labs: reviewed    Anthropometrics:   Height: 5'9 Weight: 169 lb 3 oz  UBW: 160-165 BMI: 24.98   NUTRITION DIAGNOSIS: Food and nutrition related knowledge deficit related to cancer as evidenced by no prior need for associated nutrition information    INTERVENTION:  Educated on importance of adequate calorie and protein energy intake to maintain LBM/strength Encouraged 4-6 small meals daily - handout with ideas Discussed foods with protein. Recommend protein source at every meal - handout with ideas provided  Recommend one Ensure Complete/equivalent - samples + coupons provided Contact information given     MONITORING, EVALUATION, GOAL: Pt will tolerate adequate calories and protein to minimize wt loss during treatment    Next Visit: Wednesday January 29 during infusion

## 2023-01-25 NOTE — Progress Notes (Signed)
 Pharmacist Chemotherapy Monitoring - Initial Assessment    Anticipated start date: 01/25/23   The following has been reviewed per standard work regarding the patient's treatment regimen: The patient's diagnosis, treatment plan and drug doses, and organ/hematologic function Lab orders and baseline tests specific to treatment regimen  The treatment plan start date, drug sequencing, and pre-medications Prior authorization status  Patient's documented medication list, including drug-drug interaction screen and prescriptions for anti-emetics and supportive care specific to the treatment regimen The drug concentrations, fluid compatibility, administration routes, and timing of the medications to be used The patient's access for treatment and lifetime cumulative dose history, if applicable  The patient's medication allergies and previous infusion related reactions, if applicable   Changes made to treatment plan:  N/A  Follow up needed:  N/A   Donna [name], [facility], 01/25/2023  8:05 AM

## 2023-01-28 ENCOUNTER — Other Ambulatory Visit: Payer: Self-pay

## 2023-01-28 ENCOUNTER — Telehealth: Payer: Self-pay

## 2023-01-28 ENCOUNTER — Inpatient Hospital Stay: Payer: 59

## 2023-01-28 VITALS — BP 153/65 | HR 81 | Temp 98.4°F | Resp 18

## 2023-01-28 DIAGNOSIS — Z5112 Encounter for antineoplastic immunotherapy: Secondary | ICD-10-CM | POA: Diagnosis not present

## 2023-01-28 DIAGNOSIS — C50811 Malignant neoplasm of overlapping sites of right female breast: Secondary | ICD-10-CM

## 2023-01-28 DIAGNOSIS — C50412 Malignant neoplasm of upper-outer quadrant of left female breast: Secondary | ICD-10-CM

## 2023-01-28 MED ORDER — PEGFILGRASTIM-CBQV 6 MG/0.6ML ~~LOC~~ SOSY
6.0000 mg | PREFILLED_SYRINGE | Freq: Once | SUBCUTANEOUS | Status: AC
Start: 2023-01-28 — End: 2023-01-28
  Administered 2023-01-28: 6 mg via SUBCUTANEOUS
  Filled 2023-01-28: qty 0.6

## 2023-01-28 NOTE — Telephone Encounter (Signed)
 Ms Nathaniel states that she is doing fine. She is eating, drinking, and urinating well. She knows to call the office at [phone] if  she has any questions or concerns.

## 2023-01-28 NOTE — Telephone Encounter (Signed)
-----   Message from Nurse Guilford Shi sent at 01/25/2023  2:54 PM EST ----- Regarding: FT Gud[place] TCHP First time pt of dr Pamelia Hoit. TCHP completed treatment

## 2023-01-31 ENCOUNTER — Ambulatory Visit
Admission: RE | Admit: 2023-01-31 | Discharge: 2023-01-31 | Disposition: A | Discharge status: Home / Self Care | Payer: 59 | Source: Ambulatory Visit | Attending: Hematology and Oncology | Admitting: Hematology and Oncology

## 2023-01-31 DIAGNOSIS — Z171 Estrogen receptor negative status [ER-]: Secondary | ICD-10-CM

## 2023-01-31 MED ORDER — GADOPICLENOL 0.5 MMOL/ML IV SOLN
7.0000 mL | Freq: Once | INTRAVENOUS | Status: AC | PRN
  Administered 2023-01-31: 7 mL via INTRAVENOUS

## 2023-02-01 ENCOUNTER — Encounter: Payer: Self-pay | Admitting: *Deleted

## 2023-02-01 ENCOUNTER — Other Ambulatory Visit: Payer: Self-pay | Admitting: *Deleted

## 2023-02-01 DIAGNOSIS — R928 Other abnormal and inconclusive findings on diagnostic imaging of breast: Secondary | ICD-10-CM

## 2023-02-01 DIAGNOSIS — Z171 Estrogen receptor negative status [ER-]: Secondary | ICD-10-CM

## 2023-02-01 DIAGNOSIS — Z170 Estrogen receptor positive status [ER+]: Secondary | ICD-10-CM

## 2023-02-05 ENCOUNTER — Other Ambulatory Visit: Payer: Self-pay | Admitting: Hematology and Oncology

## 2023-02-05 ENCOUNTER — Encounter: Payer: Self-pay | Admitting: *Deleted

## 2023-02-05 DIAGNOSIS — R928 Other abnormal and inconclusive findings on diagnostic imaging of breast: Secondary | ICD-10-CM

## 2023-02-05 DIAGNOSIS — Z171 Estrogen receptor negative status [ER-]: Secondary | ICD-10-CM

## 2023-02-05 DIAGNOSIS — C50412 Malignant neoplasm of upper-outer quadrant of left female breast: Secondary | ICD-10-CM

## 2023-02-12 MED FILL — Fosaprepitant Dimeglumine For IV Infusion 150 MG (Base Eq): INTRAVENOUS | Qty: 5 | Status: AC

## 2023-02-13 ENCOUNTER — Inpatient Hospital Stay (HOSPITAL_BASED_OUTPATIENT_CLINIC_OR_DEPARTMENT_OTHER): Payer: 59 | Admitting: Hematology and Oncology

## 2023-02-13 ENCOUNTER — Inpatient Hospital Stay: Payer: 59 | Admitting: Dietician

## 2023-02-13 ENCOUNTER — Inpatient Hospital Stay: Payer: 59

## 2023-02-13 VITALS — BP 176/85 | HR 93 | Temp 97.3°F | Resp 18 | Ht 69.0 in | Wt 173.1 lb

## 2023-02-13 DIAGNOSIS — Z170 Estrogen receptor positive status [ER+]: Secondary | ICD-10-CM

## 2023-02-13 DIAGNOSIS — C50811 Malignant neoplasm of overlapping sites of right female breast: Secondary | ICD-10-CM

## 2023-02-13 DIAGNOSIS — Z171 Estrogen receptor negative status [ER-]: Secondary | ICD-10-CM

## 2023-02-13 DIAGNOSIS — Z95828 Presence of other vascular implants and grafts: Secondary | ICD-10-CM

## 2023-02-13 DIAGNOSIS — Z5112 Encounter for antineoplastic immunotherapy: Secondary | ICD-10-CM | POA: Diagnosis not present

## 2023-02-13 LAB — CBC WITH DIFFERENTIAL (CANCER CENTER ONLY)
Abs Immature Granulocytes: 0.08 10*3/uL — ABNORMAL HIGH (ref 0.00–0.07)
Basophils Absolute: 0 10*3/uL (ref 0.0–0.1)
Basophils Relative: 0 %
Eosinophils Absolute: 0 10*3/uL (ref 0.0–0.5)
Eosinophils Relative: 0 %
HCT: 35.5 % — ABNORMAL LOW (ref 36.0–46.0)
Hemoglobin: 11.9 g/dL — ABNORMAL LOW (ref 12.0–15.0)
Immature Granulocytes: 1 %
Lymphocytes Relative: 18 %
Lymphs Abs: 2.2 10*3/uL (ref 0.7–4.0)
MCH: 31.4 pg (ref 26.0–34.0)
MCHC: 33.5 g/dL (ref 30.0–36.0)
MCV: 93.7 fL (ref 80.0–100.0)
Monocytes Absolute: 0.7 10*3/uL (ref 0.1–1.0)
Monocytes Relative: 6 %
Neutro Abs: 9.1 10*3/uL — ABNORMAL HIGH (ref 1.7–7.7)
Neutrophils Relative %: 75 %
Platelet Count: 340 10*3/uL (ref 150–400)
RBC: 3.79 MIL/uL — ABNORMAL LOW (ref 3.87–5.11)
RDW: 14.6 % (ref 11.5–15.5)
WBC Count: 12.1 10*3/uL — ABNORMAL HIGH (ref 4.0–10.5)
nRBC: 0 % (ref 0.0–0.2)

## 2023-02-13 LAB — CMP (CANCER CENTER ONLY)
ALT: 13 U/L (ref 0–44)
AST: 13 U/L — ABNORMAL LOW (ref 15–41)
Albumin: 3.8 g/dL (ref 3.5–5.0)
Alkaline Phosphatase: 83 U/L (ref 38–126)
Anion gap: 4 — ABNORMAL LOW (ref 5–15)
BUN: 13 mg/dL (ref 8–23)
CO2: 26 mmol/L (ref 22–32)
Calcium: 10.1 mg/dL (ref 8.9–10.3)
Chloride: 107 mmol/L (ref 98–111)
Creatinine: 0.75 mg/dL (ref 0.44–1.00)
GFR, Estimated: >60 mL/min (ref >60)
Glucose, Bld: 98 mg/dL (ref 70–99)
Potassium: 4.4 mmol/L (ref 3.5–5.1)
Sodium: 137 mmol/L (ref 135–145)
Total Bilirubin: 0.3 mg/dL (ref 0.0–1.2)
Total Protein: 6.3 g/dL — ABNORMAL LOW (ref 6.5–8.1)

## 2023-02-13 MED ORDER — SODIUM CHLORIDE 0.9 % IV SOLN
350.4000 mg | Freq: Once | INTRAVENOUS | Status: AC
  Administered 2023-02-13: 350 mg via INTRAVENOUS
  Filled 2023-02-13: qty 35

## 2023-02-13 MED ORDER — HEPARIN SOD (PORK) LOCK FLUSH 100 UNIT/ML IV SOLN
500.0000 [IU] | Freq: Once | INTRAVENOUS | Status: AC | PRN
  Administered 2023-02-13: 500 [IU]

## 2023-02-13 MED ORDER — SODIUM CHLORIDE 0.9% FLUSH
10.0000 mL | INTRAVENOUS | Status: DC | PRN
  Administered 2023-02-13: 10 mL

## 2023-02-13 MED ORDER — DIPHENHYDRAMINE HCL 25 MG PO CAPS
50.0000 mg | ORAL_CAPSULE | Freq: Once | ORAL | Status: AC
  Administered 2023-02-13: 50 mg via ORAL
  Filled 2023-02-13: qty 2

## 2023-02-13 MED ORDER — SODIUM CHLORIDE 0.9 % IV SOLN
60.0000 mg/m2 | Freq: Once | INTRAVENOUS | Status: AC
  Administered 2023-02-13: 116 mg via INTRAVENOUS
  Filled 2023-02-13: qty 11.6

## 2023-02-13 MED ORDER — SODIUM CHLORIDE 0.9 % IV SOLN
150.0000 mg | Freq: Once | INTRAVENOUS | Status: AC
  Administered 2023-02-13: 150 mg via INTRAVENOUS
  Filled 2023-02-13: qty 150

## 2023-02-13 MED ORDER — PALONOSETRON HCL INJECTION 0.25 MG/5ML
0.2500 mg | Freq: Once | INTRAVENOUS | Status: AC
  Administered 2023-02-13: 0.25 mg via INTRAVENOUS
  Filled 2023-02-13: qty 5

## 2023-02-13 MED ORDER — TRASTUZUMAB-ANNS CHEMO 150 MG IV SOLR
6.0000 mg/kg | Freq: Once | INTRAVENOUS | Status: AC
  Administered 2023-02-13: 420 mg via INTRAVENOUS
  Filled 2023-02-13: qty 20

## 2023-02-13 MED ORDER — SODIUM CHLORIDE 0.9% FLUSH
10.0000 mL | Freq: Once | INTRAVENOUS | Status: AC
  Administered 2023-02-13: 10 mL

## 2023-02-13 MED ORDER — SODIUM CHLORIDE 0.9 % IV SOLN: INTRAVENOUS | Status: DC

## 2023-02-13 MED ORDER — DEXAMETHASONE SODIUM PHOSPHATE 10 MG/ML IJ SOLN
10.0000 mg | Freq: Once | INTRAMUSCULAR | Status: AC
  Administered 2023-02-13: 10 mg via INTRAVENOUS
  Filled 2023-02-13: qty 1

## 2023-02-13 MED ORDER — ACETAMINOPHEN 325 MG PO TABS
650.0000 mg | ORAL_TABLET | Freq: Once | ORAL | Status: AC
Start: 2023-02-13 — End: 2023-02-13
  Administered 2023-02-13: 650 mg via ORAL
  Filled 2023-02-13: qty 2

## 2023-02-13 MED ORDER — SODIUM CHLORIDE 0.9 % IV SOLN
420.0000 mg | Freq: Once | INTRAVENOUS | Status: AC
  Administered 2023-02-13: 420 mg via INTRAVENOUS
  Filled 2023-02-13: qty 14

## 2023-02-13 NOTE — Progress Notes (Signed)
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 She is receiving TCHP q21d (first 1/10).   Met with patient in infusion. She reports tolerating first chemo well overall. Patient experienced abdominal cramping and bone pain following injection. States she felt like she was in labor. Pain lasted ~8 hours. Patient reports good appetite and eating well. She is drinking Ensure a few times/week. She denies nutrition impact symptoms at this time.    Medications: reviewed   Labs: reviewed   Anthropometrics: Wt 173 lb 1.6 oz today - increased   1/9 - 169 lb 3 oz  12/18 - 169 lb 6.4 oz    NUTRITION DIAGNOSIS: Food and nutrition related knowledge deficit improving     INTERVENTION:  Continue including good sources of protein at every meal Ensure as needed with decreased appetite/intake - samples + coupons provided     MONITORING, EVALUATION, GOAL: wt trends, intake   NEXT VISIT: To be scheduled as needed with treatment

## 2023-02-13 NOTE — Assessment & Plan Note (Signed)
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

## 2023-02-13 NOTE — Progress Notes (Signed)
Patient Care Team: Care, Jovita Kussmaul [facility] as PCP - General (Family Medicine) Donnelly Angelica, RN as Oncology Nurse Navigator Pershing Proud, RN as Oncology Nurse Navigator Griselda Miner, MD as Consulting Physician (General Surgery) Serena Croissant, MD as Consulting Physician (Hematology and Oncology) Dorothy Puffer, MD as Consulting Physician (Radiation Oncology)  DIAGNOSIS:  Encounter Diagnosis  Name Primary?   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 Report date is 01/16/2023.   The [facility] CancerNext+RNAinsight Panel includes sequencing, rearrangement analysis, and RNA analysis for the following 39 genes: APC, ATM, BAP1, BARD1, BMPR1A, BRCA1, BRCA2, BRIP1, CDH1, CDKN2A, CHEK2, FH, FLCN, MET, MLH1, MSH2, MSH6, MUTYH, NF1, NTHL1, PALB2, PMS2, PTEN, RAD51C, RAD51D, SMAD4, STK11, TP53, TSC1, TSC2, and VHL (sequencing and deletion/duplication); AXIN2, HOXB13, MBD4, MSH3, POLD1 and POLE (sequencing only); EPCAM and GREM1 (deletion/duplication only).    Malignant neoplasm of upper-outer quadrant of left breast in female, estrogen receptor positive  (HCC)  12/31/2022 Initial Diagnosis   Malignant neoplasm of upper-outer quadrant of left breast in female, estrogen receptor positive (HCC)   01/01/2023 Cancer Staging   Staging form: Breast, AJCC 8th Edition - Clinical: Stage IIB (cT2, cN1(f), cM0, G3, ER+, PR+, HER2-) - Signed by Ronny Bacon, PA-C on 01/01/2023 Method of lymph node assessment: Core biopsy Histologic grading system: 3 grade system   01/25/2023 -  Chemotherapy   Patient is on Treatment Plan : BREAST  Docetaxel + Carboplatin + Trastuzumab + Pertuzumab  (TCHP) q21d       Genetic Testing   [facility] CancerNext+RNA was Negative. Report date is 01/16/2023.   The [facility] CancerNext+RNAinsight Panel includes sequencing, rearrangement analysis, and RNA analysis for the following 39 genes: APC, ATM, BAP1, BARD1, BMPR1A, BRCA1, BRCA2, BRIP1, CDH1, CDKN2A, CHEK2, FH, FLCN, MET, MLH1, MSH2, MSH6, MUTYH, NF1, NTHL1, PALB2, PMS2, PTEN, RAD51C, RAD51D, SMAD4, STK11, TP53, TSC1, TSC2, and VHL (sequencing and deletion/duplication); AXIN2, HOXB13, MBD4, MSH3, POLD1 and POLE (sequencing only); EPCAM and GREM1 (deletion/duplication only).      CHIEF COMPLIANT: Cycle 2 TCHP  HISTORY OF PRESENT ILLNESS:  History of Present Illness   The patient, with breast cancer, presents for follow-up regarding chemotherapy treatment.  She is currently undergoing chemotherapy for breast cancer, with the aim of shrinking the tumor before surgery.  She experienced severe cramps a day after receiving an injection, which were managed with Tylenol. Nausea persisted for about five days and was controlled with medication. She had loose stools for four to five days, but bowel movements have since returned to normal. Currently, she has no nausea or diarrhea.  Her appetite has been fluctuating, but she continues to consume foods like cream, yogurt,  oatmeal, spinach, and apples to soothe her stomach. She has gained approximately three to four pounds, now weighing  around 168-169 pounds.  She notes thinning hair around the edges, which she manages with wigs and scarves, and dry, brittle skin, which she treats with moisturizer.  She is attempting to quit smoking and is using nicotine patches to aid in this effort. She has a strong desire to stop smoking and is praying for strength to overcome this habit.         ALLERGIES:  has no known allergies.  MEDICATIONS:  Current Outpatient Medications  Medication Sig Dispense Refill   albuterol (VENTOLIN HFA) 108 (90 Base) MCG/ACT inhaler Inhale 2 puffs into the lungs every 4 (four) hours as needed for wheezing or shortness of breath.     amLODipine (NORVASC) 10 MG tablet Take 1 tablet (10 mg total) by mouth daily.     aspirin EC 81 MG EC tablet Take 1 tablet (81 mg total) by mouth daily. Swallow whole. 30 tablet 11   busPIRone (BUSPAR) 5 MG tablet Take 5 mg by mouth 2 (two) times daily.  0   clopidogrel (PLAVIX) 75 MG tablet Take 1 tablet (75 mg total) by mouth daily.     dexamethasone (DECADRON) 4 MG tablet Take 2 tabs by mouth 2 times daily starting day before chemo. Then take 2 tabs daily for 2 days starting day after chemo. Take with food. (Patient not taking: Reported on 01/23/2023) 30 tablet 1   DULoxetine (CYMBALTA) 60 MG capsule Take 60 mg by mouth daily.     fluticasone (FLONASE) 50 MCG/ACT nasal spray Place 1 spray into both nostrils daily.     lidocaine-prilocaine (EMLA) cream Apply to affected area once (Patient not taking: Reported on 01/23/2023) 30 g 3   montelukast (SINGULAIR) 10 MG tablet Take 10 mg by mouth at bedtime.  0   Multiple Vitamins-Calcium (ONE-A-DAY WOMENS FORMULA) TABS Take by mouth daily.     nicotine (NICODERM CQ - DOSED IN MG/24 HR) 7 mg/24hr patch Place 1 patch (7 mg total) onto the skin daily as needed (Nicotine cravings). 28 patch 0   ondansetron (ZOFRAN) 8 MG tablet Take 1 tablet (8 mg total) by mouth every 8 (eight) hours as needed for nausea or vomiting. Start on the third day  after chemotherapy. (Patient not taking: Reported on 01/23/2023) 30 tablet 1   prochlorperazine (COMPAZINE) 10 MG tablet Take 1 tablet (10 mg total) by mouth every 6 (six) hours as needed for nausea or vomiting. (Patient not taking: Reported on 01/23/2023) 30 tablet 1   rosuvastatin (CRESTOR) 20 MG tablet Take 1 tablet (20 mg total) by mouth daily.     traZODone (DESYREL) 50 MG tablet Take 50 mg by mouth at bedtime.  0   zolpidem (AMBIEN) 5 MG tablet Take 1 tablet (5 mg total) by mouth at bedtime as needed for sleep. 15 tablet 1   No current facility-administered medications for this visit.    PHYSICAL EXAMINATION: ECOG PERFORMANCE STATUS: 1 - Symptomatic but completely ambulatory  Vitals:   02/13/23 0846  BP: (!) 176/85  Pulse: 93  Resp: 18  Temp: (!) 97.3 F (36.3 C)  SpO2: 98%   Filed Weights   02/13/23 0846  Weight: 173 lb 1.6 oz (78.5 kg)    Physical Exam   MEASUREMENTS: WT- 168 pounds SKIN: Dry skin.       LABORATORY DATA:  I have reviewed the data as listed    Latest Ref Rng &  Units 01/24/2023    2:13 PM 01/02/2023   12:52 PM 05/26/2020    3:19 AM  CMP  Glucose 70 - 99 mg/dL 83  83  89   BUN 8 - 23 mg/dL 11  12  17    Creatinine 0.44 - 1.00 mg/dL 3.66  4.40  3.47   Sodium 135 - 145 mmol/L 143  142  138   Potassium 3.5 - 5.1 mmol/L 3.8  5.0  3.7   Chloride 98 - 111 mmol/L 112  111  110   CO2 22 - 32 mmol/L 27  27  21    Calcium 8.9 - 10.3 mg/dL 9.4  9.7  9.1   Total Protein 6.5 - 8.1 g/dL 6.3  5.8    Total Bilirubin 0.0 - 1.2 mg/dL 0.5  0.5    Alkaline Phos 38 - 126 U/L 77  74    AST 15 - 41 U/L 19  21    ALT 0 - 44 U/L 14  16      Lab Results  Component Value Date   WBC 12.1 (H) 02/13/2023   HGB 11.9 (L) 02/13/2023   HCT 35.5 (L) 02/13/2023   MCV 93.7 02/13/2023   PLT 340 02/13/2023   NEUTROABS 9.1 (H) 02/13/2023    ASSESSMENT & PLAN:  Malignant neoplasm of overlapping sites of right breast in female, estrogen receptor negative (HCC) 12/21/2022:  Bilateral breast masses and calcifications Left breast: 3:00: 3.2 cm extending to skin, 0.9 cm satellite lesion (benign), biopsy: Grade 3 IDC with high-grade DCIS, ER 95%, PR 100%, Ki67 40%, HER2 negative, 1 lymph node positive Right breast: Spiculated mass and calcifications 2.7 cm at 11 o'clock position, additional calcifications 0.8 cm at 12 o'clock position, axilla negative, 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%, PR 0%, 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%, HER2 3+ positive   Treatment plan: Neoadjuvant TCHP x 6 cycles followed by HP maintenance versus Kadcyla maintenance Breast conserving surgery with targeted node surgery Adjuvant radiation Adjuvant antiestrogen therapy ---------------------------------------------------------------------------------------------------------------------------------- Current treatment: Cycle 1 day 8 TCHP Echocardiogram 01/23/2023: EF 60 to 65% Chemo toxicities: Hair thinning Mild nausea Mild diarrhea: Improved with Imodium  Smoking cessation: Patient is on a patch Return to clinic in 2 weeks for cycle 2   No orders of the defined types were placed in this encounter.  The patient has a good understanding of the overall plan. she agrees with it. she will call with any problems that may develop before the next visit here. Total time spent: 30 mins including face to face time and time spent for planning, charting and co-ordination of care   Tamsen Meek, MD 02/13/23

## 2023-02-13 NOTE — Patient Instructions (Signed)
[facility] - A DEPT OF [facility]  Discharge Instructions: Thank you for choosing [facility] to provide your oncology and hematology care.   If you have a lab appointment with the Cancer Center, please go directly to the Cancer Center and check in at the registration area.   Wear comfortable clothing and clothing appropriate for easy access to any Portacath or PICC line.   We strive to give you quality time with your provider. You may need to reschedule your appointment if you arrive late (15 or more minutes).  Arriving late affects you and other patients whose appointments are after yours.  Also, if you miss three or more appointments without notifying the office, you may be dismissed from the clinic at the provider's discretion.      For prescription refill requests, have your pharmacy contact our office and allow 72 hours for refills to be completed.    Today you received the following chemotherapy and/or immunotherapy agents: Kanjinti/Perjeta/Taxotere/Carboplatin.      To help prevent nausea and vomiting after your treatment, we encourage you to take your nausea medication as directed.  BELOW ARE SYMPTOMS THAT SHOULD BE REPORTED IMMEDIATELY: *FEVER GREATER THAN 100.4 F (38 C) OR HIGHER *CHILLS OR SWEATING *NAUSEA AND VOMITING THAT IS NOT CONTROLLED WITH YOUR NAUSEA MEDICATION *UNUSUAL SHORTNESS OF BREATH *UNUSUAL BRUISING OR BLEEDING *URINARY PROBLEMS (pain or burning when urinating, or frequent urination) *BOWEL PROBLEMS (unusual diarrhea, constipation, pain near the anus) TENDERNESS IN MOUTH AND THROAT WITH OR WITHOUT PRESENCE OF ULCERS (sore throat, sores in mouth, or a toothache) UNUSUAL RASH, SWELLING OR PAIN  UNUSUAL VAGINAL DISCHARGE OR ITCHING   Items with * indicate a potential emergency and should be followed up as soon as possible or go to the Emergency Department if any problems should occur.  Please show the CHEMOTHERAPY  ALERT CARD or IMMUNOTHERAPY ALERT CARD at check-in to the Emergency Department and triage nurse.  Should you have questions after your visit or need to cancel or reschedule your appointment, please contact [facility] - A DEPT OF [facility]  Dept: [phone]  and follow the prompts.  Office hours are 8:00 a.m. to 4:30 p.m. Monday - Friday. Please note that voicemails left after 4:00 p.m. may not be returned until the following business day.  We are closed weekends and major holidays. You have access to a nurse at all times for urgent questions. Please call the main number to the clinic Dept: [phone] and follow the prompts.   For any non-urgent questions, you may also contact your provider using MyChart. We now offer e-Visits for anyone 74 and older to request care online for non-urgent symptoms. For details visit [email or web address].   Also download the MyChart app! Go to the app store, search "MyChart", open the app, select [place], and log in with your MyChart username and password.

## 2023-02-14 ENCOUNTER — Other Ambulatory Visit: Payer: 59

## 2023-02-15 ENCOUNTER — Inpatient Hospital Stay: Payer: 59

## 2023-02-15 ENCOUNTER — Other Ambulatory Visit: Payer: 59

## 2023-02-15 VITALS — BP 155/64 | HR 103 | Temp 98.8°F | Resp 18

## 2023-02-15 DIAGNOSIS — Z5112 Encounter for antineoplastic immunotherapy: Secondary | ICD-10-CM | POA: Diagnosis not present

## 2023-02-15 DIAGNOSIS — Z171 Estrogen receptor negative status [ER-]: Secondary | ICD-10-CM

## 2023-02-15 DIAGNOSIS — Z170 Estrogen receptor positive status [ER+]: Secondary | ICD-10-CM

## 2023-02-15 MED ORDER — PEGFILGRASTIM-CBQV 6 MG/0.6ML ~~LOC~~ SOSY
6.0000 mg | PREFILLED_SYRINGE | Freq: Once | SUBCUTANEOUS | Status: AC
  Administered 2023-02-15: 6 mg via SUBCUTANEOUS

## 2023-02-18 ENCOUNTER — Encounter: Payer: Self-pay | Admitting: *Deleted

## 2023-02-25 ENCOUNTER — Ambulatory Visit
Admission: RE | Admit: 2023-02-25 | Discharge: 2023-02-25 | Disposition: A | Discharge status: Home / Self Care | Payer: 59 | Source: Ambulatory Visit | Attending: Hematology and Oncology

## 2023-02-25 ENCOUNTER — Ambulatory Visit
Admission: RE | Admit: 2023-02-25 | Discharge: 2023-02-25 | Disposition: A | Discharge status: Home / Self Care | Payer: 59 | Source: Ambulatory Visit | Attending: Hematology and Oncology | Admitting: Hematology and Oncology

## 2023-02-25 ENCOUNTER — Other Ambulatory Visit: Payer: Self-pay | Admitting: Hematology and Oncology

## 2023-02-25 DIAGNOSIS — R928 Other abnormal and inconclusive findings on diagnostic imaging of breast: Secondary | ICD-10-CM

## 2023-02-25 DIAGNOSIS — Z170 Estrogen receptor positive status [ER+]: Secondary | ICD-10-CM

## 2023-02-25 DIAGNOSIS — Z171 Estrogen receptor negative status [ER-]: Secondary | ICD-10-CM

## 2023-02-25 MED ORDER — GADOPICLENOL 0.5 MMOL/ML IV SOLN
8.0000 mL | Freq: Once | INTRAVENOUS | Status: AC | PRN
  Administered 2023-02-25: 8 mL via INTRAVENOUS

## 2023-02-26 ENCOUNTER — Encounter: Payer: Self-pay | Admitting: *Deleted

## 2023-02-26 DIAGNOSIS — Z171 Estrogen receptor negative status [ER-]: Secondary | ICD-10-CM

## 2023-02-26 LAB — SURGICAL PATHOLOGY

## 2023-03-05 ENCOUNTER — Telehealth: Payer: Self-pay | Admitting: Hematology and Oncology

## 2023-03-05 MED FILL — Fosaprepitant Dimeglumine For IV Infusion 150 MG (Base Eq): INTRAVENOUS | Qty: 5 | Status: AC

## 2023-03-05 NOTE — Telephone Encounter (Signed)
Canceled appointments per patient via incoming call. Patient is aware of the changes made to her upcoming appointments.

## 2023-03-06 ENCOUNTER — Inpatient Hospital Stay: Payer: 59

## 2023-03-06 ENCOUNTER — Inpatient Hospital Stay: Payer: 59 | Admitting: Hematology and Oncology

## 2023-03-07 NOTE — Addendum Note (Signed)
Encounter addended by: Edward Qualia on: 03/07/2023 9:19 AM  Actions taken: Imaging Exam ended

## 2023-03-08 ENCOUNTER — Ambulatory Visit: Payer: 59

## 2023-03-26 MED FILL — Fosaprepitant Dimeglumine For IV Infusion 150 MG (Base Eq): INTRAVENOUS | Qty: 5 | Status: AC

## 2023-03-27 ENCOUNTER — Inpatient Hospital Stay: Payer: 59

## 2023-03-27 ENCOUNTER — Inpatient Hospital Stay: Payer: 59 | Attending: Hematology and Oncology | Admitting: Hematology and Oncology

## 2023-03-27 VITALS — BP 139/51 | HR 91 | Temp 97.9°F | Resp 17 | Ht 69.0 in | Wt 168.3 lb

## 2023-03-27 VITALS — BP 131/58 | HR 75 | Resp 18

## 2023-03-27 DIAGNOSIS — C50811 Malignant neoplasm of overlapping sites of right female breast: Secondary | ICD-10-CM

## 2023-03-27 DIAGNOSIS — Z170 Estrogen receptor positive status [ER+]: Secondary | ICD-10-CM

## 2023-03-27 DIAGNOSIS — C50412 Malignant neoplasm of upper-outer quadrant of left female breast: Secondary | ICD-10-CM | POA: Insufficient documentation

## 2023-03-27 DIAGNOSIS — Z5189 Encounter for other specified aftercare: Secondary | ICD-10-CM | POA: Insufficient documentation

## 2023-03-27 DIAGNOSIS — Z1732 Human epidermal growth factor receptor 2 negative status: Secondary | ICD-10-CM | POA: Insufficient documentation

## 2023-03-27 DIAGNOSIS — Z171 Estrogen receptor negative status [ER-]: Secondary | ICD-10-CM | POA: Insufficient documentation

## 2023-03-27 DIAGNOSIS — Z1722 Progesterone receptor negative status: Secondary | ICD-10-CM | POA: Diagnosis not present

## 2023-03-27 DIAGNOSIS — Z5112 Encounter for antineoplastic immunotherapy: Secondary | ICD-10-CM | POA: Insufficient documentation

## 2023-03-27 DIAGNOSIS — Z1721 Progesterone receptor positive status: Secondary | ICD-10-CM | POA: Insufficient documentation

## 2023-03-27 DIAGNOSIS — Z5111 Encounter for antineoplastic chemotherapy: Secondary | ICD-10-CM | POA: Insufficient documentation

## 2023-03-27 DIAGNOSIS — Z95828 Presence of other vascular implants and grafts: Secondary | ICD-10-CM

## 2023-03-27 LAB — CMP (CANCER CENTER ONLY)
ALT: 25 U/L (ref 0–44)
AST: 35 U/L (ref 15–41)
Albumin: 3.9 g/dL (ref 3.5–5.0)
Alkaline Phosphatase: 78 U/L (ref 38–126)
Anion gap: 5 (ref 5–15)
BUN: 8 mg/dL (ref 8–23)
CO2: 27 mmol/L (ref 22–32)
Calcium: 9.4 mg/dL (ref 8.9–10.3)
Chloride: 110 mmol/L (ref 98–111)
Creatinine: 0.91 mg/dL (ref 0.44–1.00)
GFR, Estimated: >60 mL/min (ref >60)
Glucose, Bld: 97 mg/dL (ref 70–99)
Potassium: 3.7 mmol/L (ref 3.5–5.1)
Sodium: 142 mmol/L (ref 135–145)
Total Bilirubin: 0.5 mg/dL (ref 0.0–1.2)
Total Protein: 6 g/dL — ABNORMAL LOW (ref 6.5–8.1)

## 2023-03-27 LAB — CBC WITH DIFFERENTIAL (CANCER CENTER ONLY)
Abs Immature Granulocytes: 0.04 10*3/uL (ref 0.00–0.07)
Basophils Absolute: 0 10*3/uL (ref 0.0–0.1)
Basophils Relative: 0 %
Eosinophils Absolute: 0.7 10*3/uL — ABNORMAL HIGH (ref 0.0–0.5)
Eosinophils Relative: 7 %
HCT: 38.9 % (ref 36.0–46.0)
Hemoglobin: 12.9 g/dL (ref 12.0–15.0)
Immature Granulocytes: 0 %
Lymphocytes Relative: 29 %
Lymphs Abs: 2.7 10*3/uL (ref 0.7–4.0)
MCH: 32.7 pg (ref 26.0–34.0)
MCHC: 33.2 g/dL (ref 30.0–36.0)
MCV: 98.7 fL (ref 80.0–100.0)
Monocytes Absolute: 0.5 10*3/uL (ref 0.1–1.0)
Monocytes Relative: 5 %
Neutro Abs: 5.6 10*3/uL (ref 1.7–7.7)
Neutrophils Relative %: 59 %
Platelet Count: 225 10*3/uL (ref 150–400)
RBC: 3.94 MIL/uL (ref 3.87–5.11)
RDW: 14.5 % (ref 11.5–15.5)
WBC Count: 9.6 10*3/uL (ref 4.0–10.5)
nRBC: 0 % (ref 0.0–0.2)

## 2023-03-27 MED ORDER — SODIUM CHLORIDE 0.9% FLUSH
10.0000 mL | INTRAVENOUS | Status: DC | PRN
  Administered 2023-03-27: 10 mL

## 2023-03-27 MED ORDER — TRASTUZUMAB-ANNS CHEMO 150 MG IV SOLR
6.0000 mg/kg | Freq: Once | INTRAVENOUS | Status: AC
  Administered 2023-03-27: 420 mg via INTRAVENOUS
  Filled 2023-03-27: qty 20

## 2023-03-27 MED ORDER — SODIUM CHLORIDE 0.9 % IV SOLN
60.0000 mg/m2 | Freq: Once | INTRAVENOUS | Status: AC
  Administered 2023-03-27: 116 mg via INTRAVENOUS
  Filled 2023-03-27: qty 11.6

## 2023-03-27 MED ORDER — SODIUM CHLORIDE 0.9% FLUSH
10.0000 mL | Freq: Once | INTRAVENOUS | Status: AC
Start: 2023-03-27 — End: 2023-03-27
  Administered 2023-03-27: 10 mL

## 2023-03-27 MED ORDER — SODIUM CHLORIDE 0.9 % IV SOLN
420.0000 mg | Freq: Once | INTRAVENOUS | Status: AC
  Administered 2023-03-27: 420 mg via INTRAVENOUS
  Filled 2023-03-27: qty 14

## 2023-03-27 MED ORDER — ACETAMINOPHEN 325 MG PO TABS
650.0000 mg | ORAL_TABLET | Freq: Once | ORAL | Status: AC
  Administered 2023-03-27: 650 mg via ORAL
  Filled 2023-03-27: qty 2

## 2023-03-27 MED ORDER — HEPARIN SOD (PORK) LOCK FLUSH 100 UNIT/ML IV SOLN
500.0000 [IU] | Freq: Once | INTRAVENOUS | Status: AC | PRN
Start: 2023-03-27 — End: 2023-03-27
  Administered 2023-03-27: 500 [IU]

## 2023-03-27 MED ORDER — SODIUM CHLORIDE 0.9 % IV SOLN: INTRAVENOUS | Status: DC

## 2023-03-27 MED ORDER — PALONOSETRON HCL INJECTION 0.25 MG/5ML
0.2500 mg | Freq: Once | INTRAVENOUS | Status: AC
  Administered 2023-03-27: 0.25 mg via INTRAVENOUS
  Filled 2023-03-27: qty 5

## 2023-03-27 MED ORDER — DEXAMETHASONE SODIUM PHOSPHATE 10 MG/ML IJ SOLN
10.0000 mg | Freq: Once | INTRAMUSCULAR | Status: AC
  Administered 2023-03-27: 10 mg via INTRAVENOUS
  Filled 2023-03-27: qty 1

## 2023-03-27 MED ORDER — SODIUM CHLORIDE 0.9 % IV SOLN
350.4000 mg | Freq: Once | INTRAVENOUS | Status: AC
  Administered 2023-03-27: 350 mg via INTRAVENOUS
  Filled 2023-03-27: qty 35

## 2023-03-27 MED ORDER — SODIUM CHLORIDE 0.9 % IV SOLN
150.0000 mg | Freq: Once | INTRAVENOUS | Status: AC
  Administered 2023-03-27: 150 mg via INTRAVENOUS
  Filled 2023-03-27: qty 150

## 2023-03-27 MED ORDER — DIPHENHYDRAMINE HCL 25 MG PO CAPS
50.0000 mg | ORAL_CAPSULE | Freq: Once | ORAL | Status: AC
  Administered 2023-03-27: 50 mg via ORAL
  Filled 2023-03-27: qty 2

## 2023-03-27 NOTE — Progress Notes (Signed)
 Patient Care Team: Care, Jovita Kussmaul [facility] as PCP - General (Family Medicine) Donnelly Angelica, RN as Oncology Nurse Navigator Pershing Proud, RN as Oncology Nurse Navigator Griselda Miner, MD as Consulting Physician (General Surgery) Serena Croissant, MD as Consulting Physician (Hematology and Oncology) Dorothy Puffer, MD as Consulting Physician (Radiation Oncology)  DIAGNOSIS:  Encounter Diagnosis  Name Primary?   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 Report date is 01/16/2023.   The [facility] Cancer[facility]+RNAinsight Panel includes sequencing, rearrangement analysis, and RNA analysis for the following 39 genes: APC, ATM, BAP1, BARD1, BMPR1A, BRCA1, BRCA2, BRIP1, CDH1, CDKN2A, CHEK2, FH, FLCN, MET, MLH1, MSH2, MSH6, MUTYH, NF1, NTHL1, PALB2, PMS2, PTEN, RAD51C, RAD51D, SMAD4, STK11, TP53, TSC1, TSC2, and VHL (sequencing and deletion/duplication); AXIN2, HOXB13, MBD4, MSH3, POLD1 and POLE (sequencing only); EPCAM and GREM1 (deletion/duplication only).    Malignant neoplasm of upper-outer quadrant of left breast in female, estrogen receptor positive  (HCC)  12/31/2022 Initial Diagnosis   Malignant neoplasm of upper-outer quadrant of left breast in female, estrogen receptor positive (HCC)   01/01/2023 Cancer Staging   Staging form: Breast, AJCC 8th Edition - Clinical: Stage IIB (cT2, cN1(f), cM0, G3, ER+, PR+, HER2-) - Signed by Ronny Bacon, PA-C on 01/01/2023 Method of lymph node assessment: Core biopsy Histologic grading system: 3 grade system   01/25/2023 -  Chemotherapy   Patient is on Treatment Plan : BREAST  Docetaxel + Carboplatin + Trastuzumab + Pertuzumab  (TCHP) q21d       Genetic Testing   [facility] CancerNext+RNA was Negative. Report date is 01/16/2023.   The [facility] CancerNext+RNAinsight Panel includes sequencing, rearrangement analysis, and RNA analysis for the following 39 genes: APC, ATM, BAP1, BARD1, BMPR1A, BRCA1, BRCA2, BRIP1, CDH1, CDKN2A, CHEK2, FH, FLCN, MET, MLH1, MSH2, MSH6, MUTYH, NF1, NTHL1, PALB2, PMS2, PTEN, RAD51C, RAD51D, SMAD4, STK11, TP53, TSC1, TSC2, and VHL (sequencing and deletion/duplication); AXIN2, HOXB13, MBD4, MSH3, POLD1 and POLE (sequencing only); EPCAM and GREM1 (deletion/duplication only).      CHIEF COMPLIANT: Cycle 3 TCHP  HISTORY OF PRESENT ILLNESS:  History of Present Illness Donna [name], a patient with a history of breast cancer, presents for her third round of chemotherapy. She reports mild diarrhea after the last treatment, which was managed with Imodium. She also notes tearing in her eyes, which she attributes to the chemotherapy. She has experienced hair loss due to the treatment, but denies any numbness or tingling in her fingers or toes.  She also reports dry skin and intermittent itching, particularly in her legs, which she has been managing with shea butter and coconut oil. She notes that increased water intake seems to alleviate the itching  somewhat.  She also mentions a recent biopsy of her left breast, which was found to be benign. She also mentions a delay in her treatment  schedule due to inclement weather, which has pushed her last chemotherapy session to May 13th.     ALLERGIES:  has no known allergies.  MEDICATIONS:  Current Outpatient Medications  Medication Sig Dispense Refill   albuterol (VENTOLIN HFA) 108 (90 Base) MCG/ACT inhaler Inhale 2 puffs into the lungs every 4 (four) hours as needed for wheezing or shortness of breath.     amLODipine (NORVASC) 10 MG tablet Take 1 tablet (10 mg total) by mouth daily.     aspirin EC 81 MG EC tablet Take 1 tablet (81 mg total) by mouth daily. Swallow whole. 30 tablet 11   busPIRone (BUSPAR) 5 MG tablet Take 5 mg by mouth 2 (two) times daily.  0   clopidogrel (PLAVIX) 75 MG tablet Take 1 tablet (75 mg total) by mouth daily.     dexamethasone (DECADRON) 4 MG tablet Take 2 tabs by mouth 2 times daily starting day before chemo. Then take 2 tabs daily for 2 days starting day after chemo. Take with food. (Patient not taking: Reported on 01/23/2023) 30 tablet 1   DULoxetine (CYMBALTA) 60 MG capsule Take 60 mg by mouth daily.     fluticasone (FLONASE) 50 MCG/ACT nasal spray Place 1 spray into both nostrils daily.     lidocaine-prilocaine (EMLA) cream Apply to affected area once (Patient not taking: Reported on 01/23/2023) 30 g 3   montelukast (SINGULAIR) 10 MG tablet Take 10 mg by mouth at bedtime.  0   Multiple Vitamins-Calcium (ONE-A-DAY WOMENS FORMULA) TABS Take by mouth daily.     nicotine (NICODERM CQ - DOSED IN MG/24 HR) 7 mg/24hr patch Place 1 patch (7 mg total) onto the skin daily as needed (Nicotine cravings). 28 patch 0   ondansetron (ZOFRAN) 8 MG tablet Take 1 tablet (8 mg total) by mouth every 8 (eight) hours as needed for nausea or vomiting. Start on the third day after chemotherapy. (Patient not taking: Reported on 01/23/2023) 30 tablet 1   prochlorperazine (COMPAZINE) 10 MG tablet Take 1 tablet (10 mg total) by mouth every 6 (six) hours as needed for nausea or vomiting. (Patient not taking: Reported on 01/23/2023) 30  tablet 1   rosuvastatin (CRESTOR) 20 MG tablet Take 1 tablet (20 mg total) by mouth daily.     traZODone (DESYREL) 50 MG tablet Take 50 mg by mouth at bedtime.  0   zolpidem (AMBIEN) 5 MG tablet Take 1 tablet (5 mg total) by mouth at bedtime as needed for sleep. 15 tablet 1   No current facility-administered medications for this visit.    PHYSICAL EXAMINATION: ECOG PERFORMANCE STATUS: 1 - Symptomatic but completely ambulatory  Vitals:   03/27/23 0932  BP: (!) 139/51  Pulse: 91  Resp: 17  Temp: 97.9 F (36.6 C)  SpO2: 95%   Filed Weights   03/27/23 0932  Weight: 168 lb 4.8 oz (76.3 kg)    Physical Exam   (exam performed in the presence of a chaperone)  LABORATORY DATA:  I have reviewed the data as listed    Latest Ref Rng & Units 02/13/2023    8:24 AM 01/24/2023    2:13 PM 01/02/2023   12:52 PM  CMP  Glucose 70 - 99 mg/dL 98  83  83   BUN 8 - 23 mg/dL 13  11  12    Creatinine  0.44 - 1.00 mg/dL 1.91  4.78  2.95   Sodium 135 - 145 mmol/L 137  143  142   Potassium 3.5 - 5.1 mmol/L 4.4  3.8  5.0   Chloride 98 - 111 mmol/L 107  112  111   CO2 22 - 32 mmol/L 26  27  27    Calcium 8.9 - 10.3 mg/dL 62.1  9.4  9.7   Total Protein 6.5 - 8.1 g/dL 6.3  6.3  5.8   Total Bilirubin 0.0 - 1.2 mg/dL 0.3  0.5  0.5   Alkaline Phos 38 - 126 U/L 83  77  74   AST 15 - 41 U/L 13  19  21    ALT 0 - 44 U/L 13  14  16      Lab Results  Component Value Date   WBC 9.6 03/27/2023   HGB 12.9 03/27/2023   HCT 38.9 03/27/2023   MCV 98.7 03/27/2023   PLT 225 03/27/2023   NEUTROABS 5.6 03/27/2023    ASSESSMENT & PLAN:  Malignant neoplasm of overlapping sites of right breast in female, estrogen receptor negative (HCC) 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%, HER2 negative, 1 lymph node positive Right breast: Spiculated mass and  calcifications 2.7 cm at 11 o'clock position, additional calcifications 0.8 cm at 12 o'clock position, axilla negative, 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%, PR 0%, 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%, HER2 3+ positive   Treatment plan: Neoadjuvant TCHP x 6 cycles followed by HP maintenance versus Kadcyla maintenance Breast conserving surgery with targeted node surgery Adjuvant radiation Adjuvant antiestrogen therapy ---------------------------------------------------------------------------------------------------------------------------------- Current treatment: Cycle 3 TCHP Echocardiogram 01/23/2023: EF 60 to 65%  Chemo toxicities: Hair thinning Mild nausea Mild diarrhea: Improved with Imodium Profound dryness of the skin with itching: Encouraged her to take Benadryl and use moisturizers frequently.   Last cycle of chemotherapy could not be done because of weather issues. Therefore we will move her breast MRI that has been scheduled for April into May after she completes her final cycle of TCHP. Return to clinic in 3 weeks for cycle 4 ------------------------------------- Assessment and Plan Assessment & Plan Malignant neoplasm of overlapping sites of right breast, estrogen receptor negative Tumor responding positively to chemotherapy. Last session rescheduled due to weather delay. - Continue chemotherapy as scheduled. - Review blood work regularly. - Reschedule MRI to align with new chemotherapy schedule. - Discuss surgical options with surgeon on April 29th.  Malignant neoplasm of upper-outer quadrant of left breast, estrogen receptor positive Left breast previously benign on biopsy. Chemotherapy ongoing. - Discuss further management with surgeon on April 29th.  Chemotherapy-induced skin dryness and pruritus Significant skin dryness and pruritus secondary to chemotherapy. Moisturizers and increased water intake provide relief. - Recommend over-the-counter Benadryl for pruritus. - Advise use  of Aquaphor on affected areas. - Encourage continued use of moisturizers like shea butter and coconut oil.      No orders of the defined types were placed in this encounter.  The patient has a good understanding of the overall plan. she agrees with it. she will call with any problems that may develop before the next visit here. Total time spent: 30 mins including face to face time and time spent for planning, charting and co-ordination of care   Tamsen Meek, MD 03/27/23

## 2023-03-27 NOTE — Patient Instructions (Signed)
 [facility] - A DEPT OF [facility]  Discharge Instructions: Thank you for choosing [facility] to provide your oncology and hematology care.   If you have a lab appointment with the Cancer Center, please go directly to the Cancer Center and check in at the registration area.   Wear comfortable clothing and clothing appropriate for easy access to any Portacath or PICC line.   We strive to give you quality time with your provider. You may need to reschedule your appointment if you arrive late (15 or more minutes).  Arriving late affects you and other patients whose appointments are after yours.  Also, if you miss three or more appointments without notifying the office, you may be dismissed from the clinic at the provider's discretion.      For prescription refill requests, have your pharmacy contact our office and allow 72 hours for refills to be completed.    Today you received the following chemotherapy and/or immunotherapy agents: Kanjinti, Pertuzumab, Docetaxel, Carboplatin.       To help prevent nausea and vomiting after your treatment, we encourage you to take your nausea medication as directed.  BELOW ARE SYMPTOMS THAT SHOULD BE REPORTED IMMEDIATELY: *FEVER GREATER THAN 100.4 F (38 C) OR HIGHER *CHILLS OR SWEATING *NAUSEA AND VOMITING THAT IS NOT CONTROLLED WITH YOUR NAUSEA MEDICATION *UNUSUAL SHORTNESS OF BREATH *UNUSUAL BRUISING OR BLEEDING *URINARY PROBLEMS (pain or burning when urinating, or frequent urination) *BOWEL PROBLEMS (unusual diarrhea, constipation, pain near the anus) TENDERNESS IN MOUTH AND THROAT WITH OR WITHOUT PRESENCE OF ULCERS (sore throat, sores in mouth, or a toothache) UNUSUAL RASH, SWELLING OR PAIN  UNUSUAL VAGINAL DISCHARGE OR ITCHING   Items with * indicate a potential emergency and should be followed up as soon as possible or go to the Emergency Department if any problems should occur.  Please show the  CHEMOTHERAPY ALERT CARD or IMMUNOTHERAPY ALERT CARD at check-in to the Emergency Department and triage nurse.  Should you have questions after your visit or need to cancel or reschedule your appointment, please contact [facility] - [facility] DE[facility]  Dept: [phone]  and follow the prompts.  Office hours are 8:00 a.m. to 4:30 p.m. Monday - Friday. Please note that voicemails left after 4:00 p.m. may not be returned until the following business day.  We are closed weekends and major holidays. You have access to a nurse at all times for urgent questions. Please call the main number to the clinic Dept: [phone] and follow the prompts.   For any non-urgent questions, you may also contact your provider using MyChart. We now offer e-Visits for anyone 38 and older to request care online for non-urgent symptoms. For details visit [email or web address].   Also download the MyChart app! Go to the app store, search "MyChart", open the app, select [facility], and log in with your MyChart username and password.

## 2023-03-27 NOTE — Assessment & Plan Note (Signed)
 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

## 2023-03-29 ENCOUNTER — Inpatient Hospital Stay: Payer: 59

## 2023-03-29 VITALS — BP 150/65 | HR 92 | Temp 98.4°F | Resp 18

## 2023-03-29 DIAGNOSIS — C50811 Malignant neoplasm of overlapping sites of right female breast: Secondary | ICD-10-CM

## 2023-03-29 DIAGNOSIS — C50412 Malignant neoplasm of upper-outer quadrant of left female breast: Secondary | ICD-10-CM

## 2023-03-29 DIAGNOSIS — Z5112 Encounter for antineoplastic immunotherapy: Secondary | ICD-10-CM | POA: Diagnosis not present

## 2023-03-29 MED ORDER — PEGFILGRASTIM-CBQV 6 MG/0.6ML ~~LOC~~ SOSY
6.0000 mg | PREFILLED_SYRINGE | Freq: Once | SUBCUTANEOUS | Status: AC
Start: 2023-03-29 — End: 2023-03-29
  Administered 2023-03-29: 6 mg via SUBCUTANEOUS
  Filled 2023-03-29: qty 0.6

## 2023-04-01 ENCOUNTER — Telehealth: Payer: Self-pay | Admitting: Hematology and Oncology

## 2023-04-01 NOTE — Telephone Encounter (Signed)
 Scheduled appointments per WQ. Called and left VM with appointment details for the patient.

## 2023-04-03 ENCOUNTER — Other Ambulatory Visit: Payer: Self-pay

## 2023-04-16 ENCOUNTER — Other Ambulatory Visit: Payer: Self-pay | Admitting: *Deleted

## 2023-04-16 DIAGNOSIS — Z171 Estrogen receptor negative status [ER-]: Secondary | ICD-10-CM

## 2023-04-16 MED FILL — Fosaprepitant Dimeglumine For IV Infusion 150 MG (Base Eq): INTRAVENOUS | Qty: 5 | Status: AC

## 2023-04-17 ENCOUNTER — Other Ambulatory Visit: Payer: Self-pay | Admitting: Hematology and Oncology

## 2023-04-17 ENCOUNTER — Inpatient Hospital Stay: Payer: 59

## 2023-04-17 ENCOUNTER — Inpatient Hospital Stay: Payer: 59 | Attending: Hematology and Oncology

## 2023-04-17 ENCOUNTER — Inpatient Hospital Stay (HOSPITAL_BASED_OUTPATIENT_CLINIC_OR_DEPARTMENT_OTHER): Payer: 59 | Admitting: Hematology and Oncology

## 2023-04-17 VITALS — BP 159/68 | HR 82 | Temp 98.4°F | Resp 18 | Ht 69.0 in | Wt 167.5 lb

## 2023-04-17 DIAGNOSIS — T451X5A Adverse effect of antineoplastic and immunosuppressive drugs, initial encounter: Secondary | ICD-10-CM | POA: Insufficient documentation

## 2023-04-17 DIAGNOSIS — I10 Essential (primary) hypertension: Secondary | ICD-10-CM | POA: Insufficient documentation

## 2023-04-17 DIAGNOSIS — Z170 Estrogen receptor positive status [ER+]: Secondary | ICD-10-CM | POA: Insufficient documentation

## 2023-04-17 DIAGNOSIS — C50811 Malignant neoplasm of overlapping sites of right female breast: Secondary | ICD-10-CM

## 2023-04-17 DIAGNOSIS — Z95828 Presence of other vascular implants and grafts: Secondary | ICD-10-CM

## 2023-04-17 DIAGNOSIS — Z171 Estrogen receptor negative status [ER-]: Secondary | ICD-10-CM | POA: Diagnosis not present

## 2023-04-17 DIAGNOSIS — C50412 Malignant neoplasm of upper-outer quadrant of left female breast: Secondary | ICD-10-CM | POA: Insufficient documentation

## 2023-04-17 DIAGNOSIS — R600 Localized edema: Secondary | ICD-10-CM | POA: Insufficient documentation

## 2023-04-17 DIAGNOSIS — R5383 Other fatigue: Secondary | ICD-10-CM | POA: Diagnosis not present

## 2023-04-17 DIAGNOSIS — Z5112 Encounter for antineoplastic immunotherapy: Secondary | ICD-10-CM | POA: Insufficient documentation

## 2023-04-17 DIAGNOSIS — Z5111 Encounter for antineoplastic chemotherapy: Secondary | ICD-10-CM | POA: Diagnosis present

## 2023-04-17 DIAGNOSIS — Z79620 Long term (current) use of immunosuppressive biologic: Secondary | ICD-10-CM | POA: Diagnosis not present

## 2023-04-17 LAB — CMP (CANCER CENTER ONLY)
ALT: 15 U/L (ref 0–44)
AST: 18 U/L (ref 15–41)
Albumin: 3.6 g/dL (ref 3.5–5.0)
Alkaline Phosphatase: 93 U/L (ref 38–126)
Anion gap: 4 — ABNORMAL LOW (ref 5–15)
BUN: 17 mg/dL (ref 8–23)
CO2: 28 mmol/L (ref 22–32)
Calcium: 9.3 mg/dL (ref 8.9–10.3)
Chloride: 111 mmol/L (ref 98–111)
Creatinine: 0.82 mg/dL (ref 0.44–1.00)
GFR, Estimated: >60 mL/min (ref >60)
Glucose, Bld: 95 mg/dL (ref 70–99)
Potassium: 3.7 mmol/L (ref 3.5–5.1)
Sodium: 143 mmol/L (ref 135–145)
Total Bilirubin: 0.4 mg/dL (ref 0.0–1.2)
Total Protein: 6 g/dL — ABNORMAL LOW (ref 6.5–8.1)

## 2023-04-17 LAB — CBC WITH DIFFERENTIAL (CANCER CENTER ONLY)
Abs Immature Granulocytes: 0.03 10*3/uL (ref 0.00–0.07)
Basophils Absolute: 0.1 10*3/uL (ref 0.0–0.1)
Basophils Relative: 1 %
Eosinophils Absolute: 0 10*3/uL (ref 0.0–0.5)
Eosinophils Relative: 0 %
HCT: 37.8 % (ref 36.0–46.0)
Hemoglobin: 12.3 g/dL (ref 12.0–15.0)
Immature Granulocytes: 0 %
Lymphocytes Relative: 25 %
Lymphs Abs: 2.5 10*3/uL (ref 0.7–4.0)
MCH: 32.7 pg (ref 26.0–34.0)
MCHC: 32.5 g/dL (ref 30.0–36.0)
MCV: 100.5 fL — ABNORMAL HIGH (ref 80.0–100.0)
Monocytes Absolute: 0.6 10*3/uL (ref 0.1–1.0)
Monocytes Relative: 6 %
Neutro Abs: 6.7 10*3/uL (ref 1.7–7.7)
Neutrophils Relative %: 68 %
Platelet Count: 278 10*3/uL (ref 150–400)
RBC: 3.76 MIL/uL — ABNORMAL LOW (ref 3.87–5.11)
RDW: 14.4 % (ref 11.5–15.5)
WBC Count: 9.9 10*3/uL (ref 4.0–10.5)
nRBC: 0 % (ref 0.0–0.2)

## 2023-04-17 MED ORDER — SODIUM CHLORIDE 0.9 % IV SOLN
350.4000 mg | Freq: Once | INTRAVENOUS | Status: AC
  Administered 2023-04-17: 350 mg via INTRAVENOUS
  Filled 2023-04-17: qty 35

## 2023-04-17 MED ORDER — ACETAMINOPHEN 325 MG PO TABS
650.0000 mg | ORAL_TABLET | Freq: Once | ORAL | Status: AC
  Administered 2023-04-17: 650 mg via ORAL
  Filled 2023-04-17: qty 2

## 2023-04-17 MED ORDER — DEXAMETHASONE SODIUM PHOSPHATE 10 MG/ML IJ SOLN
10.0000 mg | Freq: Once | INTRAMUSCULAR | Status: AC
  Administered 2023-04-17: 10 mg via INTRAVENOUS
  Filled 2023-04-17: qty 1

## 2023-04-17 MED ORDER — PALONOSETRON HCL INJECTION 0.25 MG/5ML
0.2500 mg | Freq: Once | INTRAVENOUS | Status: AC
Start: 2023-04-17 — End: 2023-04-17
  Administered 2023-04-17: 0.25 mg via INTRAVENOUS
  Filled 2023-04-17: qty 5

## 2023-04-17 MED ORDER — SODIUM CHLORIDE 0.9 % IV SOLN
6.0000 mg/kg | Freq: Once | INTRAVENOUS | Status: AC
  Administered 2023-04-17: 420 mg via INTRAVENOUS
  Filled 2023-04-17: qty 20

## 2023-04-17 MED ORDER — SODIUM CHLORIDE 0.9 % IV SOLN
150.0000 mg | Freq: Once | INTRAVENOUS | Status: AC
  Administered 2023-04-17: 150 mg via INTRAVENOUS
  Filled 2023-04-17: qty 5
  Filled 2023-04-17: qty 150

## 2023-04-17 MED ORDER — SODIUM CHLORIDE 0.9 % IV SOLN
60.0000 mg/m2 | Freq: Once | INTRAVENOUS | Status: AC
  Administered 2023-04-17: 116 mg via INTRAVENOUS
  Filled 2023-04-17: qty 11.6

## 2023-04-17 MED ORDER — DIPHENHYDRAMINE HCL 25 MG PO CAPS
50.0000 mg | ORAL_CAPSULE | Freq: Once | ORAL | Status: AC
  Administered 2023-04-17: 50 mg via ORAL
  Filled 2023-04-17: qty 2

## 2023-04-17 MED ORDER — PERTUZUMAB CHEMO INJECTION 420 MG/14ML
420.0000 mg | Freq: Once | INTRAVENOUS | Status: AC
  Administered 2023-04-17: 420 mg via INTRAVENOUS
  Filled 2023-04-17: qty 14

## 2023-04-17 MED ORDER — SODIUM CHLORIDE 0.9 % IV SOLN: INTRAVENOUS | Status: DC

## 2023-04-17 MED ORDER — FUROSEMIDE 20 MG PO TABS: 20.0000 mg | ORAL_TABLET | Freq: Every day | ORAL | 1 refills | Status: DC

## 2023-04-17 MED ORDER — SODIUM CHLORIDE 0.9% FLUSH
10.0000 mL | Freq: Once | INTRAVENOUS | Status: AC
Start: 2023-04-17 — End: 2023-04-17
  Administered 2023-04-17: 10 mL

## 2023-04-17 NOTE — Assessment & Plan Note (Signed)
 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%, HER2 3+ positive   Treatment plan: Neoadjuvant TCHP x 6 cycles followed by HP maintenance versus Kadcyla maintenance Breast conserving surgery with targeted node surgery Adjuvant radiation Adjuvant antiestrogen therapy ---------------------------------------------------------------------------------------------------------------------------------- Current treatment: Cycle 4 TCHP Echocardiogram 01/23/2023: EF 60 to 65%   Chemo toxicities: Hair thinning Mild nausea Mild diarrhea: Improved with Imodium Profound dryness of the skin with itching: Encouraged her to take Benadryl and use moisturizers frequently.   Last cycle of chemotherapy could not be done because of weather issues. Therefore we will move her breast MRI that has been scheduled for April into May after she completes her final cycle of TCHP. Return to clinic in 3 weeks for cycle 5

## 2023-04-17 NOTE — Progress Notes (Signed)
 Patient Care Team: Care, Jovita Kussmaul Total Access as PCP - General (Family Medicine) Donnelly Angelica, RN as Oncology Nurse Navigator Pershing Proud, RN as Oncology Nurse Navigator Griselda Miner, MD as Consulting Physician (General Surgery) Serena Croissant, MD as Consulting Physician (Hematology and Oncology) Dorothy Puffer, MD as Consulting Physician (Radiation Oncology)  DIAGNOSIS:  Encounter Diagnosis  Name Primary?   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 Report date is 01/16/2023.   The [facility] CancerNext+RNAinsight Panel includes sequencing, rearrangement analysis, and RNA analysis for the following 39 genes: APC, ATM, BAP1, BARD1, BMPR1A, BRCA1, BRCA2, BRIP1, CDH1, CDKN2A, CHEK2, FH, FLCN, MET, MLH1, MSH2, MSH6, MUTYH, NF1, NTHL1, PALB2, PMS2, PTEN, RAD51C, RAD51D, SMAD4, STK11, TP53, TSC1, TSC2, and VHL (sequencing and deletion/duplication); AXIN2, HOXB13, MBD4, MSH3, POLD1 and POLE (sequencing only); EPCAM and GREM1 (deletion/duplication only).    Malignant neoplasm of upper-outer quadrant of left breast in female, estrogen receptor positive  (HCC)  12/31/2022 Initial Diagnosis   Malignant neoplasm of upper-outer quadrant of left breast in female, estrogen receptor positive (HCC)   01/01/2023 Cancer Staging   Staging form: Breast, AJCC 8th Edition - Clinical: Stage IIB (cT2, cN1(f), cM0, G3, ER+, PR+, HER2-) - Signed by Ronny Bacon, PA-C on 01/01/2023 Method of lymph node assessment: Core biopsy Histologic grading system: 3 grade system   01/25/2023 -  Chemotherapy   Patient is on Treatment Plan : BREAST  Docetaxel + Carboplatin + Trastuzumab + Pertuzumab  (TCHP) q21d       Genetic Testing   Amb[facility] CancerNext+RNA was Negative. Report date is 01/16/2023.   The [facility] CancerNext+RNAinsight Panel includes sequencing, rearrangement analysis, and RNA analysis for the following 39 genes: APC, ATM, BAP1, BARD1, BMPR1A, BRCA1, BRCA2, BRIP1, CDH1, CDKN2A, CHEK2, FH, FLCN, MET, MLH1, MSH2, MSH6, MUTYH, NF1, NTHL1, PALB2, PMS2, PTEN, RAD51C, RAD51D, SMAD4, STK11, TP53, TSC1, TSC2, and VHL (sequencing and deletion/duplication); AXIN2, HOXB13, MBD4, MSH3, POLD1 and POLE (sequencing only); EPCAM and GREM1 (deletion/duplication only).      CHIEF COMPLIANT: Cycle 4 TCHP  HISTORY OF PRESENT ILLNESS:   History of Present Illness The patient, with a history of cancer, is currently undergoing chemotherapy. She reports tolerating the treatment better than expected, despite the known difficulty of the regimen. She denies any numbness or tingling in her fingers and toes. Her appetite varies, with some days being better than others. She has noticed a significant drop in her energy levels, which she attributes to the chemotherapy. She reports a period of about two weeks of normalcy between chemotherapy sessions, during which her appetite improves. Her bowel movements are normal, with no reported diarrhea.  In addition to her cancer  treatment, the patient reports elevated blood pressure and edema in her legs, ankles, and feet. The swelling can  sometimes be quite severe, to the point of being almost unbearable. She expresses a need for a diuretic to help manage this symptom.     ALLERGIES:  has no known allergies.  MEDICATIONS:  Current Outpatient Medications  Medication Sig Dispense Refill   albuterol (VENTOLIN HFA) 108 (90 Base) MCG/ACT inhaler Inhale 2 puffs into the lungs every 4 (four) hours as needed for wheezing or shortness of breath.     amLODipine (NORVASC) 10 MG tablet Take 1 tablet (10 mg total) by mouth daily.     aspirin EC 81 MG EC tablet Take 1 tablet (81 mg total) by mouth daily. Swallow whole. 30 tablet 11   busPIRone (BUSPAR) 5 MG tablet Take 5 mg by mouth 2 (two) times daily.  0   clopidogrel (PLAVIX) 75 MG tablet Take 1 tablet (75 mg total) by mouth daily.     dexamethasone (DECADRON) 4 MG tablet Take 2 tabs by mouth 2 times daily starting day before chemo. Then take 2 tabs daily for 2 days starting day after chemo. Take with food. (Patient not taking: Reported on 01/23/2023) 30 tablet 1   DULoxetine (CYMBALTA) 60 MG capsule Take 60 mg by mouth daily.     fluticasone (FLONASE) 50 MCG/ACT nasal spray Place 1 spray into both nostrils daily.     lidocaine-prilocaine (EMLA) cream Apply to affected area once (Patient not taking: Reported on 01/23/2023) 30 g 3   montelukast (SINGULAIR) 10 MG tablet Take 10 mg by mouth at bedtime.  0   Multiple Vitamins-Calcium (ONE-A-DAY WOMENS FORMULA) TABS Take by mouth daily.     nicotine (NICODERM CQ - DOSED IN MG/24 HR) 7 mg/24hr patch Place 1 patch (7 mg total) onto the skin daily as needed (Nicotine cravings). 28 patch 0   ondansetron (ZOFRAN) 8 MG tablet Take 1 tablet (8 mg total) by mouth every 8 (eight) hours as needed for nausea or vomiting. Start on the third day after chemotherapy. (Patient not taking: Reported on 01/23/2023) 30 tablet 1   prochlorperazine (COMPAZINE) 10 MG tablet Take 1 tablet (10 mg total) by mouth every 6 (six) hours as needed for nausea or vomiting. (Patient  not taking: Reported on 01/23/2023) 30 tablet 1   rosuvastatin (CRESTOR) 20 MG tablet Take 1 tablet (20 mg total) by mouth daily.     traZODone (DESYREL) 50 MG tablet Take 50 mg by mouth at bedtime.  0   zolpidem (AMBIEN) 5 MG tablet Take 1 tablet (5 mg total) by mouth at bedtime as needed for sleep. 15 tablet 1   No current facility-administered medications for this visit.    PHYSICAL EXAMINATION: ECOG PERFORMANCE STATUS: 1 - Symptomatic but completely ambulatory  Vitals:   04/17/23 0931  BP: (!) 159/68  Pulse: 82  Resp: 18  Temp: 98.4 F (36.9 C)  SpO2: 95%   Filed Weights   04/17/23 0931  Weight: 167 lb 8 oz (76 kg)    Physical Exam Leg swelling  (exam performed in the presence of a chaperone)  LABORATORY DATA:  I have reviewed the data as listed    Latest Ref Rng & Units 04/17/2023    8:50 AM 03/27/2023    8:58 AM 02/13/2023    8:24 AM  CMP  Glucose 70 - 99 mg/dL 95  97  98   BUN 8 - 23 mg/dL 17  8  13  Creatinine 0.44 - 1.00 mg/dL 7.84  6.96  2.95   Sodium 135 - 145 mmol/L 143  142  137   Potassium 3.5 - 5.1 mmol/L 3.7  3.7  4.4   Chloride 98 - 111 mmol/L 111  110  107   CO2 22 - 32 mmol/L 28  27  26    Calcium 8.9 - 10.3 mg/dL 9.3  9.4  28.4   Total Protein 6.5 - 8.1 g/dL 6.0  6.0  6.3   Total Bilirubin 0.0 - 1.2 mg/dL 0.4  0.5  0.3   Alkaline Phos 38 - 126 U/L 93  78  83   AST 15 - 41 U/L 18  35  13   ALT 0 - 44 U/L 15  25  13      Lab Results  Component Value Date   WBC 9.9 04/17/2023   HGB 12.3 04/17/2023   HCT 37.8 04/17/2023   MCV 100.5 (H) 04/17/2023   PLT 278 04/17/2023   NEUTROABS 6.7 04/17/2023    ASSESSMENT & PLAN:  Malignant neoplasm of overlapping sites of right breast in female, estrogen receptor negative (HCC) 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%, HER2 negative, 1 lymph node  positive Right breast: Spiculated mass and calcifications 2.7 cm at 11 o'clock position, additional calcifications 0.8 cm at 12 o'clock position, axilla negative, 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%, PR 0%, 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%, HER2 3+ positive   Treatment plan: Neoadjuvant TCHP x 6 cycles followed by HP maintenance versus Kadcyla maintenance Breast conserving surgery with targeted node surgery Adjuvant radiation Adjuvant antiestrogen therapy ---------------------------------------------------------------------------------------------------------------------------------- Current treatment: Cycle 4 TCHP Echocardiogram 01/23/2023: EF 60 to 65%   Chemo toxicities: Hair thinning Mild nausea Mild diarrhea: Improved with Imodium Profound dryness of the skin with itching: Encouraged her to take Benadryl and use moisturizers frequently. Leg swelling: I sent a prescription for Lasix to be used as needed.   Therefore we will move her breast MRI that has been scheduled for April into May after she completes her final cycle of TCHP. Return to clinic in 3 weeks for cycle 5 ------------------------------------- Assessment and Plan Assessment & Plan Chemotherapy-induced fatigue Significant fatigue noted, common with chemotherapy. Tolerates treatment well, no neuropathy, variable appetite and taste.  Breast cancer Undergoing chemotherapy for malignant neoplasm of overlapping sites of right breast and upper-outer quadrant of left breast. Four of six chemotherapy sessions completed. Surgery planned post-chemotherapy. - Complete all six rounds of chemotherapy. - Plan for surgery post-chemotherapy to remove any remaining microscopic cancer.  Hypertension Reports elevated blood pressure, possibly related to treatment or other conditions. Echocardiogram needed to assess cardiac function. - Schedule an echocardiogram in the next few weeks to assess cardiac function.  Peripheral edema Reports ankle and  foot swelling, possibly related to hypertension or other factors. - Prescribe a diuretic to be taken as needed for swelling. - Send prescription to [facility].      No orders of the defined types were placed in this encounter.  The patient has a good understanding of the overall plan. she agrees with it. she will call with any problems that may develop before the next visit here. Total time spent: 30 mins including face to face time and time spent for planning, charting and co-ordination of care   Tamsen Meek, MD 04/17/23

## 2023-04-17 NOTE — Patient Instructions (Signed)
 [facility] - A DEPT OF [facility]  Discharge Instructions: Thank you for choosing [facility] to provide your oncology and hematology care.   If you have a lab appointment with the Cancer Center, please go directly to the Cancer Center and check in at the registration area.   Wear comfortable clothing and clothing appropriate for easy access to any Portacath or PICC line.   We strive to give you quality time with your provider. You may need to reschedule your appointment if you arrive late (15 or more minutes).  Arriving late affects you and other patients whose appointments are after yours.  Also, if you miss three or more appointments without notifying the office, you may be dismissed from the clinic at the provider's discretion.      For prescription refill requests, have your pharmacy contact our office and allow 72 hours for refills to be completed.    Today you received the following chemotherapy and/or immunotherapy agents trastuzumab, perjeta, carboplatin, docetaxel      To help prevent nausea and vomiting after your treatment, we encourage you to take your nausea medication as directed.  BELOW ARE SYMPTOMS THAT SHOULD BE REPORTED IMMEDIATELY: *FEVER GREATER THAN 100.4 F (38 C) OR HIGHER *CHILLS OR SWEATING *NAUSEA AND VOMITING THAT IS NOT CONTROLLED WITH YOUR NAUSEA MEDICATION *UNUSUAL SHORTNESS OF BREATH *UNUSUAL BRUISING OR BLEEDING *URINARY PROBLEMS (pain or burning when urinating, or frequent urination) *BOWEL PROBLEMS (unusual diarrhea, constipation, pain near the anus) TENDERNESS IN MOUTH AND THROAT WITH OR WITHOUT PRESENCE OF ULCERS (sore throat, sores in mouth, or a toothache) UNUSUAL RASH, SWELLING OR PAIN  UNUSUAL VAGINAL DISCHARGE OR ITCHING   Items with * indicate a potential emergency and should be followed up as soon as possible or go to the Emergency Department if any problems should occur.  Please show the  CHEMOTHERAPY ALERT CARD or IMMUNOTHERAPY ALERT CARD at check-in to the Emergency Department and triage nurse.  Should you have questions after your visit or need to cancel or reschedule your appointment, please contact [facility] - A DE[facility]  Dept: [phone]  and follow the prompts.  Office hours are 8:00 a.m. to 4:30 p.m. Monday - Friday. Please note that voicemails left after 4:00 p.m. may not be returned until the following business day.  We are closed weekends and major holidays. You have access to a nurse at all times for urgent questions. Please call the main number to the clinic Dept: [phone] and follow the prompts.   For any non-urgent questions, you may also contact your provider using MyChart. We now offer e-Visits for anyone 12 and older to request care online for non-urgent symptoms. For details visit [email or web address].   Also download the MyChart app! Go to the app store, search "MyChart", open the app, select [facility], and log in with your MyChart username and password.

## 2023-04-19 ENCOUNTER — Inpatient Hospital Stay: Payer: 59

## 2023-04-19 VITALS — BP 142/54 | HR 87 | Temp 98.0°F | Resp 18

## 2023-04-19 DIAGNOSIS — Z5112 Encounter for antineoplastic immunotherapy: Secondary | ICD-10-CM | POA: Diagnosis not present

## 2023-04-19 DIAGNOSIS — C50412 Malignant neoplasm of upper-outer quadrant of left female breast: Secondary | ICD-10-CM

## 2023-04-19 DIAGNOSIS — C50811 Malignant neoplasm of overlapping sites of right female breast: Secondary | ICD-10-CM

## 2023-04-19 MED ORDER — PEGFILGRASTIM-CBQV 6 MG/0.6ML ~~LOC~~ SOSY
6.0000 mg | PREFILLED_SYRINGE | Freq: Once | SUBCUTANEOUS | Status: AC
  Administered 2023-04-19: 6 mg via SUBCUTANEOUS
  Filled 2023-04-19: qty 0.6

## 2023-04-19 NOTE — Patient Instructions (Signed)

## 2023-05-07 ENCOUNTER — Encounter: Payer: Self-pay | Admitting: *Deleted

## 2023-05-07 MED FILL — Fosaprepitant Dimeglumine For IV Infusion 150 MG (Base Eq): INTRAVENOUS | Qty: 5 | Status: AC

## 2023-05-08 ENCOUNTER — Inpatient Hospital Stay (HOSPITAL_BASED_OUTPATIENT_CLINIC_OR_DEPARTMENT_OTHER): Payer: 59 | Admitting: Hematology and Oncology

## 2023-05-08 ENCOUNTER — Inpatient Hospital Stay: Payer: 59

## 2023-05-08 VITALS — BP 150/59 | HR 87 | Temp 97.8°F | Resp 16 | Ht 69.0 in | Wt 163.0 lb

## 2023-05-08 DIAGNOSIS — Z171 Estrogen receptor negative status [ER-]: Secondary | ICD-10-CM

## 2023-05-08 DIAGNOSIS — Z170 Estrogen receptor positive status [ER+]: Secondary | ICD-10-CM

## 2023-05-08 DIAGNOSIS — C50811 Malignant neoplasm of overlapping sites of right female breast: Secondary | ICD-10-CM | POA: Diagnosis not present

## 2023-05-08 DIAGNOSIS — Z5112 Encounter for antineoplastic immunotherapy: Secondary | ICD-10-CM | POA: Diagnosis not present

## 2023-05-08 DIAGNOSIS — Z95828 Presence of other vascular implants and grafts: Secondary | ICD-10-CM

## 2023-05-08 LAB — CBC WITH DIFFERENTIAL (CANCER CENTER ONLY)
Abs Immature Granulocytes: 0.02 10*3/uL (ref 0.00–0.07)
Basophils Absolute: 0.1 10*3/uL (ref 0.0–0.1)
Basophils Relative: 1 %
Eosinophils Absolute: 0 10*3/uL (ref 0.0–0.5)
Eosinophils Relative: 0 %
HCT: 34.8 % — ABNORMAL LOW (ref 36.0–46.0)
Hemoglobin: 11.3 g/dL — ABNORMAL LOW (ref 12.0–15.0)
Immature Granulocytes: 0 %
Lymphocytes Relative: 27 %
Lymphs Abs: 2.2 10*3/uL (ref 0.7–4.0)
MCH: 32.7 pg (ref 26.0–34.0)
MCHC: 32.5 g/dL (ref 30.0–36.0)
MCV: 100.6 fL — ABNORMAL HIGH (ref 80.0–100.0)
Monocytes Absolute: 0.6 10*3/uL (ref 0.1–1.0)
Monocytes Relative: 7 %
Neutro Abs: 5.3 10*3/uL (ref 1.7–7.7)
Neutrophils Relative %: 65 %
Platelet Count: 245 10*3/uL (ref 150–400)
RBC: 3.46 MIL/uL — ABNORMAL LOW (ref 3.87–5.11)
RDW: 13.7 % (ref 11.5–15.5)
WBC Count: 8.2 10*3/uL (ref 4.0–10.5)
nRBC: 0 % (ref 0.0–0.2)

## 2023-05-08 LAB — CMP (CANCER CENTER ONLY)
ALT: 10 U/L (ref 0–44)
AST: 14 U/L — ABNORMAL LOW (ref 15–41)
Albumin: 3.4 g/dL — ABNORMAL LOW (ref 3.5–5.0)
Alkaline Phosphatase: 89 U/L (ref 38–126)
Anion gap: 3 — ABNORMAL LOW (ref 5–15)
BUN: 9 mg/dL (ref 8–23)
CO2: 27 mmol/L (ref 22–32)
Calcium: 9.1 mg/dL (ref 8.9–10.3)
Chloride: 112 mmol/L — ABNORMAL HIGH (ref 98–111)
Creatinine: 0.73 mg/dL (ref 0.44–1.00)
GFR, Estimated: >60 mL/min (ref >60)
Glucose, Bld: 93 mg/dL (ref 70–99)
Potassium: 3.9 mmol/L (ref 3.5–5.1)
Sodium: 142 mmol/L (ref 135–145)
Total Bilirubin: 0.3 mg/dL (ref 0.0–1.2)
Total Protein: 5.7 g/dL — ABNORMAL LOW (ref 6.5–8.1)

## 2023-05-08 MED ORDER — SODIUM CHLORIDE 0.9 % IV SOLN: INTRAVENOUS | Status: DC

## 2023-05-08 MED ORDER — SODIUM CHLORIDE 0.9 % IV SOLN
420.0000 mg | Freq: Once | INTRAVENOUS | Status: AC
  Administered 2023-05-08: 420 mg via INTRAVENOUS
  Filled 2023-05-08: qty 14

## 2023-05-08 MED ORDER — SODIUM CHLORIDE 0.9% FLUSH
10.0000 mL | Freq: Once | INTRAVENOUS | Status: AC
  Administered 2023-05-08: 10 mL

## 2023-05-08 MED ORDER — DIPHENHYDRAMINE HCL 25 MG PO CAPS
25.0000 mg | ORAL_CAPSULE | Freq: Once | ORAL | Status: AC
  Administered 2023-05-08: 25 mg via ORAL
  Filled 2023-05-08: qty 1

## 2023-05-08 MED ORDER — ACETAMINOPHEN 325 MG PO TABS
650.0000 mg | ORAL_TABLET | Freq: Once | ORAL | Status: AC
  Administered 2023-05-08: 650 mg via ORAL
  Filled 2023-05-08: qty 2

## 2023-05-08 MED ORDER — TRASTUZUMAB-ANNS CHEMO 150 MG IV SOLR
6.0000 mg/kg | Freq: Once | INTRAVENOUS | Status: AC
  Administered 2023-05-08: 420 mg via INTRAVENOUS
  Filled 2023-05-08: qty 20

## 2023-05-08 NOTE — Progress Notes (Signed)
 Patient Care Team: Care, Arnold Bicker Total Access as PCP - General (Family Medicine) Alane Hsu, RN as Oncology Nurse Navigator Auther Bo, RN as Oncology Nurse Navigator Caralyn Chandler, MD as Consulting Physician (General Surgery) Cameron Cea, MD as Consulting Physician (Hematology and Oncology) Johna Myers, MD as Consulting Physician (Radiation Oncology)  DIAGNOSIS:  Encounter Diagnosis  Name Primary?   Malignant neoplasm of overlapping sites of right breast in female, estrogen receptor negative (HCC) Yes    SUMMARY OF ONCOLOGIC HISTORY: Oncology History  Malignant neoplasm of overlapping sites of right breast in female, estrogen receptor negative (HCC)  12/31/2022 Initial Diagnosis   Malignant neoplasm of overlapping sites of right breast in female, estrogen receptor negative (HCC)   01/01/2023 Cancer Staging   Staging form: Breast, AJCC 8th Edition - Clinical stage from 01/01/2023: Stage IIA (cT2, cN0, cM0, G3, ER-, PR-, HER2+) - Signed by Cameron Cea, MD on 01/02/2023 Stage prefix: Initial diagnosis Method of lymph node assessment: Clinical Histologic grading system: 3 grade system   01/25/2023 -  Chemotherapy   Patient is on Treatment Plan : BREAST  Docetaxel  + Carboplatin  + Trastuzumab  + Pertuzumab   (TCHP) q21d       Genetic Testing   Amb[facility] CancerNext+RNA was Negative. Report date is 01/16/2023.   The [facility] CancerNext+RNAinsight Panel includes sequencing, rearrangement analysis, and RNA analysis for the following 39 genes: APC, ATM, BAP1, BARD1, BMPR1A, BRCA1, BRCA2, BRIP1, CDH1, CDKN2A, CHEK2, FH, FLCN, MET, MLH1, MSH2, MSH6, MUTYH, NF1, NTHL1, PALB2, PMS2, PTEN, RAD51C, RAD51D, SMAD4, STK11, TP53, TSC1, TSC2, and VHL (sequencing and deletion/duplication); AXIN2, HOXB13, MBD4, MSH3, POLD1 and POLE (sequencing only); EPCAM and GREM1 (deletion/duplication only).    Malignant neoplasm of upper-outer quadrant of left breast in female, estrogen receptor positive  (HCC)  12/31/2022 Initial Diagnosis   Malignant neoplasm of upper-outer quadrant of left breast in female, estrogen receptor positive (HCC)   01/01/2023 Cancer Staging   Staging form: Breast, AJCC 8th Edition - Clinical: Stage IIB (cT2, cN1(f), cM0, G3, ER+, PR+, HER2-) - Signed by Bettejane Brownie, PA-C on 01/01/2023 Method of lymph node assessment: Core biopsy Histologic grading system: 3 grade system   01/25/2023 -  Chemotherapy   Patient is on Treatment Plan : BREAST  Docetaxel  + Carboplatin  + Trastuzumab  + Pertuzumab   (TCHP) q21d       Genetic Testing   Ambry CancerNext+RNA was Negative. Report date is 01/16/2023.   The [facility] CancerNext+RNAinsight Panel includes sequencing, rearrangement analysis, and RNA analysis for the following 39 genes: APC, ATM, BAP1, BARD1, BMPR1A, BRCA1, BRCA2, BRIP1, CDH1, CDKN2A, CHEK2, FH, FLCN, MET, MLH1, MSH2, MSH6, MUTYH, NF1, NTHL1, PALB2, PMS2, PTEN, RAD51C, RAD51D, SMAD4, STK11, TP53, TSC1, TSC2, and VHL (sequencing and deletion/duplication); AXIN2, HOXB13, MBD4, MSH3, POLD1 and POLE (sequencing only); EPCAM and GREM1 (deletion/duplication only).      CHIEF COMPLIANT: Cycle 5 TCHP (Taxotere  and will be discontinued)  HISTORY OF PRESENT ILLNESS: History of Present Illness The patient, with a history of breast cancer, presents with weakness, loss of appetite, altered taste, depression, and restlessness. She has completed four rounds of chemotherapy and is scheduled for a fifth round today. However, she reports that the treatments have been increasingly difficult due to the cumulative effects of the chemotherapy. She has been trying to remain optimistic despite the side effects. She also reports restlessness. She is scheduled for an MRI tomorrow to assess the response of the breast cancer to the chemotherapy.     ALLERGIES:  has no  known allergies.  MEDICATIONS:  Current Outpatient Medications  Medication Sig Dispense Refill   albuterol   (VENTOLIN  HFA) 108 (90 Base) MCG/ACT inhaler Inhale 2 puffs into the lungs every 4 (four) hours as needed for wheezing or shortness of breath.     amLODipine  (NORVASC ) 10 MG tablet Take 1 tablet (10 mg total) by mouth daily.     aspirin  EC 81 MG EC tablet Take 1 tablet (81 mg total) by mouth daily. Swallow whole. 30 tablet 11   busPIRone  (BUSPAR ) 5 MG tablet Take 5 mg by mouth 2 (two) times daily.  0   clopidogrel  (PLAVIX ) 75 MG tablet Take 1 tablet (75 mg total) by mouth daily.     dexamethasone  (DECADRON ) 4 MG tablet Take 2 tabs by mouth 2 times daily starting day before chemo. Then take 2 tabs daily for 2 days starting day after chemo. Take with food. 30 tablet 1   DULoxetine  (CYMBALTA ) 60 MG capsule Take 60 mg by mouth daily.     fluticasone  (FLONASE ) 50 MCG/ACT nasal spray Place 1 spray into both nostrils daily.     furosemide  (LASIX ) 20 MG tablet TAKE 1 TABLET(20 MG) BY MOUTH DAILY 90 tablet 0   lidocaine -prilocaine  (EMLA ) cream Apply to affected area once 30 g 3   montelukast (SINGULAIR) 10 MG tablet Take 10 mg by mouth at bedtime.  0   Multiple Vitamins-Calcium  (ONE-A-DAY WOMENS FORMULA) TABS Take by mouth daily.     nicotine  (NICODERM CQ  - DOSED IN MG/24 HR) 7 mg/24hr patch Place 1 patch (7 mg total) onto the skin daily as needed (Nicotine  cravings). 28 patch 0   rosuvastatin  (CRESTOR ) 20 MG tablet Take 1 tablet (20 mg total) by mouth daily.     traZODone  (DESYREL ) 50 MG tablet Take 50 mg by mouth at bedtime.  0   zolpidem  (AMBIEN ) 5 MG tablet Take 1 tablet (5 mg total) by mouth at bedtime as needed for sleep. 15 tablet 1   ondansetron  (ZOFRAN ) 8 MG tablet Take 1 tablet (8 mg total) by mouth every 8 (eight) hours as needed for nausea or vomiting. Start on the third day after chemotherapy. (Patient not taking: Reported on 05/08/2023) 30 tablet 1   prochlorperazine  (COMPAZINE ) 10 MG tablet Take 1 tablet (10 mg total) by mouth every 6 (six) hours as needed for nausea or vomiting. (Patient not  taking: Reported on 05/08/2023) 30 tablet 1   No current facility-administered medications for this visit.    PHYSICAL EXAMINATION: ECOG PERFORMANCE STATUS: 1 - Symptomatic but completely ambulatory  Vitals:   05/08/23 0920  BP: (!) 150/59  Pulse: 87  Resp: 16  Temp: 97.8 F (36.6 C)  SpO2: 98%   Filed Weights   05/08/23 0920  Weight: 163 lb (73.9 kg)    LABORATORY DATA:  I have reviewed the data as listed    Latest Ref Rng & Units 04/17/2023    8:50 AM 03/27/2023    8:58 AM 02/13/2023    8:24 AM  CMP  Glucose 70 - 99 mg/dL 95  97  98   BUN 8 - 23 mg/dL 17  8  13    Creatinine 0.44 - 1.00 mg/dL 8.46  9.62  9.52   Sodium 135 - 145 mmol/L 143  142  137   Potassium 3.5 - 5.1 mmol/L 3.7  3.7  4.4   Chloride 98 - 111 mmol/L 111  110  107   CO2 22 - 32 mmol/L 28  27  26  Calcium  8.9 - 10.3 mg/dL 9.3  9.4  16.1   Total Protein 6.5 - 8.1 g/dL 6.0  6.0  6.3   Total Bilirubin 0.0 - 1.2 mg/dL 0.4  0.5  0.3   Alkaline Phos 38 - 126 U/L 93  78  83   AST 15 - 41 U/L 18  35  13   ALT 0 - 44 U/L 15  25  13      Lab Results  Component Value Date   WBC 9.9 04/17/2023   HGB 12.3 04/17/2023   HCT 37.8 04/17/2023   MCV 100.5 (H) 04/17/2023   PLT 278 04/17/2023   NEUTROABS 6.7 04/17/2023    ASSESSMENT & PLAN:  Malignant neoplasm of overlapping sites of right breast in female, estrogen receptor negative (HCC) 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%, HER2 3+ positive   Treatment plan: Neoadjuvant TCHP x 6 cycles followed by HP maintenance versus Kadcyla maintenance Breast  conserving surgery with targeted node surgery Adjuvant radiation Adjuvant antiestrogen therapy ---------------------------------------------------------------------------------------------------------------------------------- Current treatment: Cycle 5 TCHP (Taxotere  and carboplatin  will be discontinued) Echocardiogram 01/23/2023: EF 60 to 65%   Chemo toxicities: Hair thinning Mild nausea Mild diarrhea: Improved with Imodium Profound dryness of the skin with itching: Encouraged her to take Benadryl  and use moisturizers frequently. Leg swelling: Lasix  as needed   Breast MRI has been scheduled for tomorrow.  I sent a message to Dr. Alethea Andes to get her back in to discuss surgical options.  She does not need Neulasta  injection.     Assessment & Plan Malignant neoplasm of breast Chemotherapy discontinued due to significant side effects. Continuing Herceptin  and Perjeta  for symptom improvement and therapeutic maintenance. MRI scheduled to assess response, followed by surgical consultation. - Discontinue chemotherapy. - Continue Herceptin  and Perjeta  every three weeks to complete one year of treatment. - Perform breast MRI tomorrow. - Coordinate surgical consultation with Dr. Alethea Andes. - Discontinue dexamethasone .      No orders of the defined types were placed in this encounter.  The patient has a good understanding of the overall plan. she agrees with it. she will call with any problems that may develop before the next visit here. Total time spent: 40 mins including face to face time and time spent for planning, charting and co-ordination of care   Viinay K Tesslyn Baumert, MD 05/08/23

## 2023-05-08 NOTE — Assessment & Plan Note (Signed)
 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%, HER2 3+ positive   Treatment plan: Neoadjuvant TCHP x 6 cycles followed by HP maintenance versus Kadcyla maintenance Breast conserving surgery with targeted node surgery Adjuvant radiation Adjuvant antiestrogen therapy ---------------------------------------------------------------------------------------------------------------------------------- Current treatment: Cycle 5 TCHP Echocardiogram 01/23/2023: EF 60 to 65%   Chemo toxicities: Hair thinning Mild nausea Mild diarrhea: Improved with Imodium Profound dryness of the skin with itching: Encouraged her to take Benadryl  and use moisturizers frequently. Leg swelling: I sent a prescription for Lasix  to be used as needed.   Therefore we will move her breast MRI that has been scheduled for April into May after she completes her final cycle of TCHP. Return to clinic in 3 weeks for cycle 6

## 2023-05-08 NOTE — Patient Instructions (Signed)
 [facility] - A DEPT OF [facility]  Discharge Instructions: Thank you for choosing [facility] to provide your oncology and hematology care.   If you have a lab appointment with the Cancer Center, please go directly to the Cancer Center and check in at the registration area.   Wear comfortable clothing and clothing appropriate for easy access to any Portacath or PICC line.   We strive to give you quality time with your provider. You may need to reschedule your appointment if you arrive late (15 or more minutes).  Arriving late affects you and other patients whose appointments are after yours.  Also, if you miss three or more appointments without notifying the office, you may be dismissed from the clinic at the provider's discretion.      For prescription refill requests, have your pharmacy contact our office and allow 72 hours for refills to be completed.    Today you received the following chemotherapy and/or immunotherapy agents herceptin , perjeta       To help prevent nausea and vomiting after your treatment, we encourage you to take your nausea medication as directed.  BELOW ARE SYMPTOMS THAT SHOULD BE REPORTED IMMEDIATELY: *FEVER GREATER THAN 100.4 F (38 C) OR HIGHER *CHILLS OR SWEATING *NAUSEA AND VOMITING THAT IS NOT CONTROLLED WITH YOUR NAUSEA MEDICATION *UNUSUAL SHORTNESS OF BREATH *UNUSUAL BRUISING OR BLEEDING *URINARY PROBLEMS (pain or burning when urinating, or frequent urination) *BOWEL PROBLEMS (unusual diarrhea, constipation, pain near the anus) TENDERNESS IN MOUTH AND THROAT WITH OR WITHOUT PRESENCE OF ULCERS (sore throat, sores in mouth, or a toothache) UNUSUAL RASH, SWELLING OR PAIN  UNUSUAL VAGINAL DISCHARGE OR ITCHING   Items with * indicate a potential emergency and should be followed up as soon as possible or go to the Emergency Department if any problems should occur.  Please show the CHEMOTHERAPY ALERT CARD or  IMMUNOTHERAPY ALERT CARD at check-in to the Emergency Department and triage nurse.  Should you have questions after your visit or need to cancel or reschedule your appointment, please contact [facility] - A DEPT OF [facility]  Dept: [phone]  and follow the prompts.  Office hours are 8:00 a.m. to 4:30 p.m. Monday - Friday. Please note that voicemails left after 4:00 p.m. may not be returned until the following business day.  We are closed weekends and major holidays. You have access to a nurse at all times for urgent questions. Please call the main number to the clinic Dept: [phone] and follow the prompts.   For any non-urgent questions, you may also contact your provider using MyChart. We now offer e-Visits for anyone 98 and older to request care online for non-urgent symptoms. For details visit [email or web address].   Also download the MyChart app! Go to the app store, search "MyChart", open the app, select , and log in with your MyChart username and password.

## 2023-05-09 ENCOUNTER — Ambulatory Visit
Admission: RE | Admit: 2023-05-09 | Discharge: 2023-05-09 | Disposition: A | Discharge status: Home / Self Care | Payer: 59 | Source: Ambulatory Visit | Attending: Hematology and Oncology | Admitting: Hematology and Oncology

## 2023-05-09 ENCOUNTER — Ambulatory Visit (HOSPITAL_COMMUNITY)

## 2023-05-09 ENCOUNTER — Encounter: Payer: Self-pay | Admitting: *Deleted

## 2023-05-09 DIAGNOSIS — Z171 Estrogen receptor negative status [ER-]: Secondary | ICD-10-CM

## 2023-05-09 MED ORDER — GADOPICLENOL 0.5 MMOL/ML IV SOLN
8.0000 mL | Freq: Once | INTRAVENOUS | Status: AC | PRN
  Administered 2023-05-09: 8 mL via INTRAVENOUS

## 2023-05-10 ENCOUNTER — Ambulatory Visit: Payer: 59

## 2023-05-17 ENCOUNTER — Ambulatory Visit (HOSPITAL_COMMUNITY)
Admission: RE | Admit: 2023-05-17 | Discharge: 2023-05-17 | Disposition: A | Discharge status: Home / Self Care | Source: Ambulatory Visit | Attending: Hematology and Oncology | Admitting: Hematology and Oncology

## 2023-05-17 DIAGNOSIS — Z171 Estrogen receptor negative status [ER-]: Secondary | ICD-10-CM | POA: Diagnosis not present

## 2023-05-17 DIAGNOSIS — C50811 Malignant neoplasm of overlapping sites of right female breast: Secondary | ICD-10-CM

## 2023-05-17 DIAGNOSIS — I071 Rheumatic tricuspid insufficiency: Secondary | ICD-10-CM | POA: Diagnosis not present

## 2023-05-17 DIAGNOSIS — Z0189 Encounter for other specified special examinations: Secondary | ICD-10-CM

## 2023-05-17 LAB — ECHOCARDIOGRAM COMPLETE
Area-P 1/2: 2.92 cm2
S' Lateral: 2.6 cm

## 2023-05-21 ENCOUNTER — Telehealth: Payer: Self-pay | Admitting: *Deleted

## 2023-05-21 ENCOUNTER — Encounter: Payer: Self-pay | Admitting: *Deleted

## 2023-05-21 NOTE — Telephone Encounter (Signed)
 Pt no show for appt with Dr. Alethea Andes. Received new appt date and time for 5/9 arrive at 3:45pm. Unable to reach via phone. Sent verified email with appt date, time and location for new appt to be seen by Dr. Alethea Andes.

## 2023-05-22 ENCOUNTER — Other Ambulatory Visit: Payer: Self-pay | Admitting: Hematology and Oncology

## 2023-05-29 ENCOUNTER — Encounter: Payer: Self-pay | Admitting: Adult Health

## 2023-05-29 ENCOUNTER — Inpatient Hospital Stay: Attending: Hematology and Oncology

## 2023-05-29 ENCOUNTER — Inpatient Hospital Stay (HOSPITAL_BASED_OUTPATIENT_CLINIC_OR_DEPARTMENT_OTHER): Admitting: Adult Health

## 2023-05-29 ENCOUNTER — Other Ambulatory Visit: Payer: Self-pay

## 2023-05-29 ENCOUNTER — Inpatient Hospital Stay

## 2023-05-29 VITALS — BP 144/51 | HR 81 | Temp 98.4°F | Resp 18 | Ht 69.0 in | Wt 160.3 lb

## 2023-05-29 DIAGNOSIS — Z1732 Human epidermal growth factor receptor 2 negative status: Secondary | ICD-10-CM | POA: Diagnosis not present

## 2023-05-29 DIAGNOSIS — F17210 Nicotine dependence, cigarettes, uncomplicated: Secondary | ICD-10-CM | POA: Insufficient documentation

## 2023-05-29 DIAGNOSIS — Z170 Estrogen receptor positive status [ER+]: Secondary | ICD-10-CM | POA: Diagnosis not present

## 2023-05-29 DIAGNOSIS — C50811 Malignant neoplasm of overlapping sites of right female breast: Secondary | ICD-10-CM | POA: Insufficient documentation

## 2023-05-29 DIAGNOSIS — Z171 Estrogen receptor negative status [ER-]: Secondary | ICD-10-CM

## 2023-05-29 DIAGNOSIS — Z1722 Progesterone receptor negative status: Secondary | ICD-10-CM | POA: Insufficient documentation

## 2023-05-29 DIAGNOSIS — C50412 Malignant neoplasm of upper-outer quadrant of left female breast: Secondary | ICD-10-CM

## 2023-05-29 DIAGNOSIS — Z1721 Progesterone receptor positive status: Secondary | ICD-10-CM | POA: Insufficient documentation

## 2023-05-29 DIAGNOSIS — E876 Hypokalemia: Secondary | ICD-10-CM | POA: Insufficient documentation

## 2023-05-29 DIAGNOSIS — Z5112 Encounter for antineoplastic immunotherapy: Secondary | ICD-10-CM | POA: Diagnosis present

## 2023-05-29 DIAGNOSIS — Z1731 Human epidermal growth factor receptor 2 positive status: Secondary | ICD-10-CM | POA: Diagnosis not present

## 2023-05-29 DIAGNOSIS — Z79899 Other long term (current) drug therapy: Secondary | ICD-10-CM | POA: Diagnosis not present

## 2023-05-29 DIAGNOSIS — Z95828 Presence of other vascular implants and grafts: Secondary | ICD-10-CM

## 2023-05-29 LAB — CBC WITH DIFFERENTIAL (CANCER CENTER ONLY)
Abs Immature Granulocytes: 0.03 10*3/uL (ref 0.00–0.07)
Basophils Absolute: 0 10*3/uL (ref 0.0–0.1)
Basophils Relative: 0 %
Eosinophils Absolute: 0.1 10*3/uL (ref 0.0–0.5)
Eosinophils Relative: 2 %
HCT: 38.4 % (ref 36.0–46.0)
Hemoglobin: 12.6 g/dL (ref 12.0–15.0)
Immature Granulocytes: 0 %
Lymphocytes Relative: 31 %
Lymphs Abs: 2.8 10*3/uL (ref 0.7–4.0)
MCH: 32.5 pg (ref 26.0–34.0)
MCHC: 32.8 g/dL (ref 30.0–36.0)
MCV: 99 fL (ref 80.0–100.0)
Monocytes Absolute: 0.5 10*3/uL (ref 0.1–1.0)
Monocytes Relative: 5 %
Neutro Abs: 5.5 10*3/uL (ref 1.7–7.7)
Neutrophils Relative %: 62 %
Platelet Count: 200 10*3/uL (ref 150–400)
RBC: 3.88 MIL/uL (ref 3.87–5.11)
RDW: 13.1 % (ref 11.5–15.5)
WBC Count: 8.9 10*3/uL (ref 4.0–10.5)
nRBC: 0 % (ref 0.0–0.2)

## 2023-05-29 LAB — CMP (CANCER CENTER ONLY)
ALT: 10 U/L (ref 0–44)
AST: 14 U/L — ABNORMAL LOW (ref 15–41)
Albumin: 3.7 g/dL (ref 3.5–5.0)
Alkaline Phosphatase: 87 U/L (ref 38–126)
Anion gap: 4 — ABNORMAL LOW (ref 5–15)
BUN: 9 mg/dL (ref 8–23)
CO2: 27 mmol/L (ref 22–32)
Calcium: 9 mg/dL (ref 8.9–10.3)
Chloride: 111 mmol/L (ref 98–111)
Creatinine: 0.77 mg/dL (ref 0.44–1.00)
GFR, Estimated: >60 mL/min (ref >60)
Glucose, Bld: 108 mg/dL — ABNORMAL HIGH (ref 70–99)
Potassium: 3.1 mmol/L — ABNORMAL LOW (ref 3.5–5.1)
Sodium: 142 mmol/L (ref 135–145)
Total Bilirubin: 0.5 mg/dL (ref 0.0–1.2)
Total Protein: 6 g/dL — ABNORMAL LOW (ref 6.5–8.1)

## 2023-05-29 MED ORDER — SODIUM CHLORIDE 0.9 % IV SOLN: INTRAVENOUS | Status: DC

## 2023-05-29 MED ORDER — ACETAMINOPHEN 325 MG PO TABS
650.0000 mg | ORAL_TABLET | Freq: Once | ORAL | Status: AC
Start: 2023-05-29 — End: 2023-05-29
  Administered 2023-05-29: 650 mg via ORAL
  Filled 2023-05-29: qty 2

## 2023-05-29 MED ORDER — POTASSIUM CHLORIDE 10 MEQ/100ML IV SOLN
10.0000 meq | INTRAVENOUS | Status: AC
  Administered 2023-05-29 (×2): 10 meq via INTRAVENOUS

## 2023-05-29 MED ORDER — SODIUM CHLORIDE 0.9% FLUSH
10.0000 mL | Freq: Once | INTRAVENOUS | Status: AC
  Administered 2023-05-29: 10 mL

## 2023-05-29 MED ORDER — DIPHENHYDRAMINE HCL 25 MG PO CAPS
25.0000 mg | ORAL_CAPSULE | Freq: Once | ORAL | Status: AC
  Administered 2023-05-29: 25 mg via ORAL
  Filled 2023-05-29: qty 1

## 2023-05-29 MED ORDER — POTASSIUM CHLORIDE IN NACL 20-0.9 MEQ/L-% IV SOLN: INTRAVENOUS | Status: DC

## 2023-05-29 MED ORDER — TRASTUZUMAB-ANNS CHEMO 150 MG IV SOLR
6.0000 mg/kg | Freq: Once | INTRAVENOUS | Status: AC
  Administered 2023-05-29: 420 mg via INTRAVENOUS
  Filled 2023-05-29: qty 20

## 2023-05-29 NOTE — Progress Notes (Signed)
 [facility] Cancer Follow up:    Care, [facility] [address] [place] [address]   DIAGNOSIS:  Cancer Staging  Malignant neoplasm of overlapping sites of right breast in female, estrogen receptor negative (HCC) Staging form: Breast, AJCC 8th Edition - Clinical stage from 01/01/2023: Stage IIA (cT2, cN0, cM0, G3, ER-, PR-, HER2+) - Signed by Cameron Cea, MD on 01/02/2023 Stage prefix: Initial diagnosis Method of lymph node assessment: Clinical Histologic grading system: 3 grade system  Malignant neoplasm of upper-outer quadrant of left breast in female, estrogen receptor positive (HCC) Staging form: Breast, AJCC 8th Edition - Clinical: Stage IIB (cT2, cN1(f), cM0, G3, ER+, PR+, HER2-) - Signed by Bettejane Brownie, PA-C on 01/01/2023 Method of lymph node assessment: Core biopsy Histologic grading system: 3 grade system    SUMMARY OF ONCOLOGIC HISTORY: Oncology History  Malignant neoplasm of overlapping sites of right breast in female, estrogen receptor negative (HCC)  12/31/2022 Initial Diagnosis   Malignant neoplasm of overlapping sites of right breast in female, estrogen receptor negative (HCC)   01/01/2023 Cancer Staging   Staging form: Breast, AJCC 8th Edition - Clinical stage from 01/01/2023: Stage IIA (cT2, cN0, cM0, G3, ER-, PR-, HER2+) - Signed by Cameron Cea, MD on 01/02/2023 Stage prefix: Initial diagnosis Method of lymph node assessment: Clinical Histologic grading system: 3 grade system   01/25/2023 -  Chemotherapy   Patient is on Treatment Plan : BREAST  Docetaxel  + Carboplatin  + Trastuzumab  + Pertuzumab   (TCHP) q21d       Genetic Testing   [facility] CancerNext+RNA was Negative. Report date is 01/16/2023.   The [facility] CancerNext+RNAinsight Panel includes sequencing, rearrangement analysis, and RNA analysis for the following 39 genes: APC, ATM, BAP1, BARD1, BMPR1A, BRCA1, BRCA2, BRIP1, CDH1, CDKN2A, CHEK2,  FH, FLCN, MET, MLH1, MSH2, MSH6, MUTYH, NF1, NTHL1, PALB2, PMS2, PTEN, RAD51C, RAD51D, SMAD4, STK11, TP53, TSC1, TSC2, and VHL (sequencing and deletion/duplication); AXIN2, HOXB13, MBD4, MSH3, POLD1 and POLE (sequencing only); EPCAM and GREM1 (deletion/duplication only).    Malignant neoplasm of upper-outer quadrant of left breast in female, estrogen receptor positive (HCC)  12/31/2022 Initial Diagnosis   Malignant neoplasm of upper-outer quadrant of left breast in female, estrogen receptor positive (HCC)   01/01/2023 Cancer Staging   Staging form: Breast, AJCC 8th Edition - Clinical: Stage IIB (cT2, cN1(f), cM0, G3, ER+, PR+, HER2-) - Signed by Bettejane Brownie, PA-C on 01/01/2023 Method of lymph node assessment: Core biopsy Histologic grading system: 3 grade system   01/25/2023 -  Chemotherapy   Patient is on Treatment Plan : BREAST  Docetaxel  + Carboplatin  + Trastuzumab  + Pertuzumab   (TCHP) q21d       Genetic Testing   [facility] CancerNext+RNA was Negative. Report date is 01/16/2023.   The [facility] CancerNext+RNAinsight Panel includes sequencing, rearrangement analysis, and RNA analysis for the following 39 genes: APC, ATM, BAP1, BARD1, BMPR1A, BRCA1, BRCA2, BRIP1, CDH1, CDKN2A, CHEK2, FH, FLCN, MET, MLH1, MSH2, MSH6, MUTYH, NF1, NTHL1, PALB2, PMS2, PTEN, RAD51C, RAD51D, SMAD4, STK11, TP53, TSC1, TSC2, and VHL (sequencing and deletion/duplication); AXIN2, HOXB13, MBD4, MSH3, POLD1 and POLE (sequencing only); EPCAM and GREM1 (deletion/duplication only).      CURRENT THERAPY: Herceptin  and Perjeta   INTERVAL HISTORY:  Donna [name] 73 y.o. female returns for f/u prior to receiving Herceptin /Perjeta .  She stopped Taxotere /Carbo portion of treatment after four cycles of therapy.    She underwent breast MRI on May 09, 2023 that demonstrated an interval significant  decrease in the size of the dominant biopsy-proven malignancy in the upper central right breast now with mild residual  enhancement spanning 1.2 cm.  Adjacent tiny satellite nodule had also decreased in size.  No new suspicious findings appeared in the right breast.  The dominant biopsy-proven malignant mass in the outer left breast appeared more necrotic though overall size was not significantly different compared to prior.  The multiple satellite nodules extending medially from the mass appear mildly decreased in size compared to prior there were no new suspicious findings in the left breast.  The biopsy-proven metastatic lymph node in the left axilla appeared similar with cortical thickening measuring 0.5 cm.  Her most recent echocardiogram occurred on May 17, 2023 demonstrating a left ventricular ejection fraction of 55 to 60%.  Lenon Radar received Herceptin  Perjeta  alone 3 weeks ago.  She notes that despite not receiving chemotherapy she still felt very tired and is unsure if she wants to proceed with therapy today.  She noticed she had decreased appetite and 1 day of diarrhea that is since resolved.  She did start to feel better several days ago and has continued to improve daily.  Patient Active Problem List   Diagnosis Date Noted   Port-A-Cath in place 01/24/2023   Genetic testing 01/17/2023   Malignant neoplasm of overlapping sites of right breast in female, estrogen receptor negative (HCC) 12/31/2022   Malignant neoplasm of upper-outer quadrant of left breast in female, estrogen receptor positive (HCC) 12/31/2022   Cough    Cigarette nicotine  dependence without complication    Stroke (HCC) 05/25/2020   Primary hypertension     has no known allergies.  MEDICAL HISTORY: Past Medical History:  Diagnosis Date   Allergy    Anxiety    Breast cancer (HCC)    Depression    Hyperlipidemia    no meds    Hypertension     SURGICAL HISTORY: Past Surgical History:  Procedure Laterality Date   BREAST BIOPSY Left 12/21/2022   US  LT BREAST BX W LOC DEV 1ST LESION IMG BX SPEC US  GUIDE 12/21/2022 [facility]    BREAST BIOPSY Left 12/21/2022   US  LT BREAST BX W LOC DEV EA ADD LESION IMG BX SPEC US  GUIDE 12/21/2022 [facility]   BREAST BIOPSY Right 12/26/2022   US  RT BREAST BX W LOC DEV 1ST LESION IMG BX SPEC US  GUIDE 12/26/2022 [facility]   BREAST BIOPSY Right 12/26/2022   US  RT BREAST BX W LOC DEV EA ADD LESION IMG BX SPEC US  GUIDE 12/26/2022 [facility]   BREAST BIOPSY Right 12/26/2022   MM RT BREAST BX W LOC DEV 1ST LESION IMAGE BX SPEC STEREO GUIDE 12/26/2022 [facility]   DENTAL SURGERY     IR IMAGING GUIDED PORT INSERTION  01/22/2023   NSVD     x2   TUBAL LIGATION  1976    SOCIAL HISTORY: Social History   Socioeconomic History   Marital status: Divorced    Spouse name: Not on file   Number of children: Not on file   Years of education: Not on file   Highest education level: Not on file  Occupational History   Not on file  Tobacco Use   Smoking status: Every Day   Smokeless tobacco: Never   Tobacco comments:    8 cigs a day   Substance and Sexual Activity   Alcohol use: No   Drug use: No   Sexual activity: Not on file  Other Topics Concern  Not on file  Social History Narrative   Not on file   Social Drivers of Health   Financial Resource Strain: Not at Risk (04/04/2022)   Received from [place], [place]   Financial Resource Strain    Financial Resource Strain: 1  Food Insecurity: Not at Risk (04/04/2022)   Received from [place], [place]   Food Insecurity    Food: 1  Transportation Needs: Not at Risk (04/04/2022)   Received from [place], [employer] Needs    Transportation: 1  Physical Activity: Not on File (05/04/2021)   Received from [place], [place]   Physical Activity    Physical Activity: 0  Stress: Not on File (05/04/2021)   Received from [facility], [place]   Stress    Stress: 0  Social Connections: Not on File (09/24/2022)   Received from [employer]   Social Connections    Connectedness: 0  Intimate Partner Violence: Not on file     FAMILY HISTORY: Family History  Problem Relation Age of Onset   Colon cancer Mother 87 - 70   Cancer Father 40 - 94       unknown type   Colon polyps Sister    Esophageal cancer Sister 20 - 68   Lung cancer Sister 27 - 39   Ovarian cancer Sister 93   Colon cancer Brother 14   Breast cancer Niece        dx. <50   Rectal cancer Neg Hx    Stomach cancer Neg Hx    Pancreatic cancer Neg Hx     Review of Systems  Constitutional:  Positive for fatigue. Negative for appetite change, chills, fever and unexpected weight change.  HENT:   Negative for hearing loss, lump/mass and trouble swallowing.   Eyes:  Negative for eye problems and icterus.  Respiratory:  Negative for chest tightness, cough and shortness of breath.   Cardiovascular:  Negative for chest pain, leg swelling and palpitations.  Gastrointestinal:  Negative for abdominal distention, abdominal pain, constipation, diarrhea, nausea and vomiting.  Endocrine: Negative for hot flashes.  Genitourinary:  Negative for difficulty urinating.   Musculoskeletal:  Negative for arthralgias.  Skin:  Negative for itching and rash.  Neurological:  Negative for dizziness, extremity weakness, headaches and numbness.  Hematological:  Negative for adenopathy. Does not bruise/bleed easily.  Psychiatric/Behavioral:  Negative for depression. The patient is not nervous/anxious.       PHYSICAL EXAMINATION    Vitals:   05/29/23 0924  BP: (!) 144/51  Pulse: 81  Resp: 18  Temp: 98.4 F (36.9 C)  SpO2: 99%    Physical Exam Constitutional:      General: She is not in acute distress.    Appearance: Normal appearance. She is not toxic-appearing.  HENT:     Head: Normocephalic and atraumatic.     Mouth/Throat:     Mouth: Mucous membranes are moist.     Pharynx: Oropharynx is clear. No oropharyngeal exudate or posterior oropharyngeal erythema.  Eyes:     General: No scleral icterus. Cardiovascular:     Rate and Rhythm: Normal rate  and regular rhythm.     Pulses: Normal pulses.     Heart sounds: Normal heart sounds.  Pulmonary:     Effort: Pulmonary effort is normal.     Breath sounds: Normal breath sounds.  Abdominal:     General: Abdomen is flat. Bowel sounds are normal. There is no distension.     Palpations: Abdomen is soft.  Tenderness: There is no abdominal tenderness.  Musculoskeletal:        General: No swelling.     Cervical back: Neck supple.  Lymphadenopathy:     Cervical: No cervical adenopathy.  Skin:    General: Skin is warm and dry.     Findings: No rash.  Neurological:     General: No focal deficit present.     Mental Status: She is alert.     Comments: Left-sided residual weakness from stroke (occurred 3 years ago)  Psychiatric:        Mood and Affect: Mood normal.        Behavior: Behavior normal.     LABORATORY DATA:  CBC    Component Value Date/Time   WBC 8.9 05/29/2023 0900   WBC 10.3 05/29/2020 0622   RBC 3.88 05/29/2023 0900   HGB 12.6 05/29/2023 0900   HCT 38.4 05/29/2023 0900   PLT 200 05/29/2023 0900   MCV 99.0 05/29/2023 0900   MCH 32.5 05/29/2023 0900   MCHC 32.8 05/29/2023 0900   RDW 13.1 05/29/2023 0900   LYMPHSABS 2.8 05/29/2023 0900   MONOABS 0.5 05/29/2023 0900   EOSABS 0.1 05/29/2023 0900   BASOSABS 0.0 05/29/2023 0900    CMP     Component Value Date/Time   NA 142 05/29/2023 0900   K 3.1 (L) 05/29/2023 0900   CL 111 05/29/2023 0900   CO2 27 05/29/2023 0900   GLUCOSE 108 (H) 05/29/2023 0900   BUN 9 05/29/2023 0900   CREATININE 0.77 05/29/2023 0900   CALCIUM  9.0 05/29/2023 0900   PROT 6.0 (L) 05/29/2023 0900   ALBUMIN 3.7 05/29/2023 0900   AST 14 (L) 05/29/2023 0900   ALT 10 05/29/2023 0900   ALKPHOS 87 05/29/2023 0900   BILITOT 0.5 05/29/2023 0900   GFRNONAA >60 05/29/2023 0900     ASSESSMENT and THERAPY PLAN:   Malignant neoplasm of overlapping sites of right breast in female, estrogen receptor negative (HCC) 12/21/2022: Bilateral  breast masses and calcifications Left breast: 3:00: 3.2 cm extending to skin, 0.9 cm satellite lesion (benign), biopsy: Grade 3 IDC with high-grade DCIS, ER 95%, PR 100%, Ki67 40%, HER2 negative, 1 lymph node positive Right breast: Spiculated mass and calcifications 2.7 cm at 11 o'clock position, additional calcifications 0.8 cm at 12 o'clock position, axilla negative, 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%, PR 0%, 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  We will remove Perjeta  and see if this improves with only receiving the Herceptin  alone. Hypokalemia: She will receive 20 mill equivalents of IV potassium today and infusion.  I counseled her on potassium rich foods to eat. Next steps: I gave her the appointment with Dr. Alethea Andes today.  She verbalized the appointment date and time being tomorrow and told me she believed her daughter could take her senses in the afternoon.  I gave her Dr. Elmo Haff card with the address and phone number on it if she is unable to make it so she can reschedule.  RTC in 3 weeks for labs, f/u and Herceptin .   The above was reviewed with Dr. Lee Public in detail who  is in agreement with the assessment and plan.  All questions were answered. The patient knows to call the clinic with any problems, questions or concerns. We can certainly see the patient much sooner if necessary.  Total encounter time:40 minutes*in  face-to-face visit time, chart review, lab review, care coordination, order entry, and documentation of the encounter time.    Alwin Baars, NP 05/29/23 11:35 AM Medical Oncology and Hematology [facility] [address] [place], [place] [address] Tel. [phone]    Fax. [phone]  *Total Encounter Time as defined by the Centers for Medicare and Medicaid Services includes, in addition to the face-to-face time of a patient visit (documented in the note above) non-face-to-face time: obtaining and reviewing outside history, ordering and reviewing medications, tests or procedures, care coordination (communications with other health care professionals or caregivers) and documentation in the medical record.

## 2023-05-29 NOTE — Patient Instructions (Signed)
 [facility] - A DEPT OF [facility]  Discharge Instructions: Thank you for choosing [facility] to provide your oncology and hematology care.   If you have a lab appointment with the Cancer Center, please go directly to the Cancer Center and check in at the registration area.   Wear comfortable clothing and clothing appropriate for easy access to any Portacath or PICC line.   We strive to give you quality time with your provider. You may need to reschedule your appointment if you arrive late (15 or more minutes).  Arriving late affects you and other patients whose appointments are after yours.  Also, if you miss three or more appointments without notifying the office, you may be dismissed from the clinic at the provider's discretion.      For prescription refill requests, have your pharmacy contact our office and allow 72 hours for refills to be completed.    Today you received the following chemotherapy and/or immunotherapy agents herceptin      To help prevent nausea and vomiting after your treatment, we encourage you to take your nausea medication as directed.  BELOW ARE SYMPTOMS THAT SHOULD BE REPORTED IMMEDIATELY: *FEVER GREATER THAN 100.4 F (38 C) OR HIGHER *CHILLS OR SWEATING *NAUSEA AND VOMITING THAT IS NOT CONTROLLED WITH YOUR NAUSEA MEDICATION *UNUSUAL SHORTNESS OF BREATH *UNUSUAL BRUISING OR BLEEDING *URINARY PROBLEMS (pain or burning when urinating, or frequent urination) *BOWEL PROBLEMS (unusual diarrhea, constipation, pain near the anus) TENDERNESS IN MOUTH AND THROAT WITH OR WITHOUT PRESENCE OF ULCERS (sore throat, sores in mouth, or a toothache) UNUSUAL RASH, SWELLING OR PAIN  UNUSUAL VAGINAL DISCHARGE OR ITCHING   Items with * indicate a potential emergency and should be followed up as soon as possible or go to the Emergency Department if any problems should occur.  Please show the CHEMOTHERAPY ALERT CARD or IMMUNOTHERAPY  ALERT CARD at check-in to the Emergency Department and triage nurse.  Should you have questions after your visit or need to cancel or reschedule your appointment, please contact [facility] - A DEPT OF [facility]  Dept: [phone]  and follow the prompts.  Office hours are 8:00 a.m. to 4:30 p.m. Monday - Friday. Please note that voicemails left after 4:00 p.m. may not be returned until the following business day.  We are closed weekends and major holidays. You have access to a nurse at all times for urgent questions. Please call the main number to the clinic Dept: [phone] and follow the prompts.   For any non-urgent questions, you may also contact your provider using MyChart. We now offer e-Visits for anyone 2 and older to request care online for non-urgent symptoms. For details visit [email or web address].   Also download the MyChart app! Go to the app store, search "MyChart", open the app, select [place], and log in with your MyChart username and password.

## 2023-05-29 NOTE — Assessment & Plan Note (Signed)
 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  We will remove Perjeta  and see if this improves with only receiving the Herceptin  alone. Hypokalemia: She will receive 20 mill equivalents of IV potassium today and infusion.  I counseled her on potassium rich foods to eat. Next steps: I gave her the appointment with Dr. Alethea Andes today.  She verbalized the appointment date and time being tomorrow and told me she believed her daughter could take her senses in the afternoon.  I gave her Dr. Elmo Haff card with the address and phone number on it if she is unable to make it so she can reschedule.  RTC in 3 weeks for labs, f/u and Herceptin .

## 2023-05-30 ENCOUNTER — Telehealth: Payer: Self-pay | Admitting: Hematology and Oncology

## 2023-05-30 ENCOUNTER — Ambulatory Visit: Payer: Self-pay | Admitting: General Surgery

## 2023-05-30 ENCOUNTER — Other Ambulatory Visit: Payer: Self-pay

## 2023-05-30 DIAGNOSIS — C50412 Malignant neoplasm of upper-outer quadrant of left female breast: Secondary | ICD-10-CM

## 2023-05-30 DIAGNOSIS — C50811 Malignant neoplasm of overlapping sites of right female breast: Secondary | ICD-10-CM

## 2023-05-30 NOTE — Telephone Encounter (Signed)
 Left vm about scheduled appt date and time.

## 2023-05-31 ENCOUNTER — Ambulatory Visit

## 2023-06-01 ENCOUNTER — Other Ambulatory Visit: Payer: Self-pay

## 2023-06-04 ENCOUNTER — Encounter: Payer: Self-pay | Admitting: *Deleted

## 2023-06-11 ENCOUNTER — Encounter: Payer: Self-pay | Admitting: *Deleted

## 2023-06-13 ENCOUNTER — Other Ambulatory Visit: Payer: Self-pay | Admitting: Hematology and Oncology

## 2023-06-19 ENCOUNTER — Encounter: Payer: Self-pay | Admitting: *Deleted

## 2023-06-20 ENCOUNTER — Inpatient Hospital Stay

## 2023-06-20 ENCOUNTER — Encounter: Payer: Self-pay | Admitting: *Deleted

## 2023-06-20 ENCOUNTER — Inpatient Hospital Stay (HOSPITAL_BASED_OUTPATIENT_CLINIC_OR_DEPARTMENT_OTHER): Admitting: Hematology and Oncology

## 2023-06-20 ENCOUNTER — Inpatient Hospital Stay: Attending: Hematology and Oncology

## 2023-06-20 VITALS — BP 139/62 | HR 83 | Temp 97.6°F | Resp 18 | Ht 69.0 in | Wt 160.0 lb

## 2023-06-20 DIAGNOSIS — Z79899 Other long term (current) drug therapy: Secondary | ICD-10-CM | POA: Diagnosis not present

## 2023-06-20 DIAGNOSIS — Z8673 Personal history of transient ischemic attack (TIA), and cerebral infarction without residual deficits: Secondary | ICD-10-CM | POA: Insufficient documentation

## 2023-06-20 DIAGNOSIS — Z95828 Presence of other vascular implants and grafts: Secondary | ICD-10-CM

## 2023-06-20 DIAGNOSIS — Z170 Estrogen receptor positive status [ER+]: Secondary | ICD-10-CM

## 2023-06-20 DIAGNOSIS — Z5112 Encounter for antineoplastic immunotherapy: Secondary | ICD-10-CM | POA: Diagnosis present

## 2023-06-20 DIAGNOSIS — C50811 Malignant neoplasm of overlapping sites of right female breast: Secondary | ICD-10-CM

## 2023-06-20 DIAGNOSIS — Z171 Estrogen receptor negative status [ER-]: Secondary | ICD-10-CM

## 2023-06-20 DIAGNOSIS — G4700 Insomnia, unspecified: Secondary | ICD-10-CM | POA: Insufficient documentation

## 2023-06-20 DIAGNOSIS — Z7902 Long term (current) use of antithrombotics/antiplatelets: Secondary | ICD-10-CM | POA: Diagnosis not present

## 2023-06-20 DIAGNOSIS — C50412 Malignant neoplasm of upper-outer quadrant of left female breast: Secondary | ICD-10-CM | POA: Diagnosis not present

## 2023-06-20 LAB — CBC WITH DIFFERENTIAL (CANCER CENTER ONLY)
Abs Immature Granulocytes: 0.01 10*3/uL (ref 0.00–0.07)
Basophils Absolute: 0 10*3/uL (ref 0.0–0.1)
Basophils Relative: 0 %
Eosinophils Absolute: 0.1 10*3/uL (ref 0.0–0.5)
Eosinophils Relative: 1 %
HCT: 40.2 % (ref 36.0–46.0)
Hemoglobin: 13.2 g/dL (ref 12.0–15.0)
Immature Granulocytes: 0 %
Lymphocytes Relative: 41 %
Lymphs Abs: 3.7 10*3/uL (ref 0.7–4.0)
MCH: 32.4 pg (ref 26.0–34.0)
MCHC: 32.8 g/dL (ref 30.0–36.0)
MCV: 98.5 fL (ref 80.0–100.0)
Monocytes Absolute: 0.4 10*3/uL (ref 0.1–1.0)
Monocytes Relative: 5 %
Neutro Abs: 4.7 10*3/uL (ref 1.7–7.7)
Neutrophils Relative %: 53 %
Platelet Count: 196 10*3/uL (ref 150–400)
RBC: 4.08 MIL/uL (ref 3.87–5.11)
RDW: 13.1 % (ref 11.5–15.5)
WBC Count: 9 10*3/uL (ref 4.0–10.5)
nRBC: 0 % (ref 0.0–0.2)

## 2023-06-20 LAB — CMP (CANCER CENTER ONLY)
ALT: 11 U/L (ref 0–44)
AST: 19 U/L (ref 15–41)
Albumin: 4 g/dL (ref 3.5–5.0)
Alkaline Phosphatase: 82 U/L (ref 38–126)
Anion gap: 3 — ABNORMAL LOW (ref 5–15)
BUN: 7 mg/dL — ABNORMAL LOW (ref 8–23)
CO2: 27 mmol/L (ref 22–32)
Calcium: 9.7 mg/dL (ref 8.9–10.3)
Chloride: 110 mmol/L (ref 98–111)
Creatinine: 0.77 mg/dL (ref 0.44–1.00)
GFR, Estimated: >60 mL/min (ref >60)
Glucose, Bld: 114 mg/dL — ABNORMAL HIGH (ref 70–99)
Potassium: 3.7 mmol/L (ref 3.5–5.1)
Sodium: 140 mmol/L (ref 135–145)
Total Bilirubin: 0.4 mg/dL (ref 0.0–1.2)
Total Protein: 6.5 g/dL (ref 6.5–8.1)

## 2023-06-20 MED ORDER — TRASTUZUMAB-ANNS CHEMO 150 MG IV SOLR
6.0000 mg/kg | Freq: Once | INTRAVENOUS | Status: AC
  Administered 2023-06-20: 420 mg via INTRAVENOUS
  Filled 2023-06-20: qty 20

## 2023-06-20 MED ORDER — ACETAMINOPHEN 325 MG PO TABS
650.0000 mg | ORAL_TABLET | Freq: Once | ORAL | Status: AC
  Administered 2023-06-20: 650 mg via ORAL
  Filled 2023-06-20: qty 2

## 2023-06-20 MED ORDER — SODIUM CHLORIDE 0.9 % IV SOLN: INTRAVENOUS | Status: DC

## 2023-06-20 MED ORDER — DIPHENHYDRAMINE HCL 25 MG PO CAPS
25.0000 mg | ORAL_CAPSULE | Freq: Once | ORAL | Status: AC
  Administered 2023-06-20: 25 mg via ORAL
  Filled 2023-06-20: qty 1

## 2023-06-20 MED ORDER — SODIUM CHLORIDE 0.9% FLUSH
10.0000 mL | Freq: Once | INTRAVENOUS | Status: AC
  Administered 2023-06-20: 10 mL

## 2023-06-20 NOTE — Progress Notes (Signed)
 Patient Care Team: Care, Arnold Bicker Total Access as PCP - General (Family Medicine) Alane Hsu, RN as Oncology Nurse Navigator Auther Bo, RN as Oncology Nurse Navigator Caralyn Chandler, MD as Consulting Physician (General Surgery) Cameron Cea, MD as Consulting Physician (Hematology and Oncology) Johna Myers, MD as Consulting Physician (Radiation Oncology)  DIAGNOSIS:  Encounter Diagnosis  Name Primary?   Malignant neoplasm of overlapping sites of right breast in female, estrogen receptor negative (HCC) Yes    SUMMARY OF ONCOLOGIC HISTORY: Oncology History  Malignant neoplasm of overlapping sites of right breast in female, estrogen receptor negative (HCC)  12/31/2022 Initial Diagnosis   Malignant neoplasm of overlapping sites of right breast in female, estrogen receptor negative (HCC)   01/01/2023 Cancer Staging   Staging form: Breast, AJCC 8th Edition - Clinical stage from 01/01/2023: Stage IIA (cT2, cN0, cM0, G3, ER-, PR-, HER2+) - Signed by Cameron Cea, MD on 01/02/2023 Stage prefix: Initial diagnosis Method of lymph node assessment: Clinical Histologic grading system: 3 grade system   01/25/2023 -  Chemotherapy   Patient is on Treatment Plan : BREAST  Docetaxel  + Carboplatin  + Trastuzumab  + Pertuzumab   (TCHP) q21d       Genetic Testing   [facility] CancerNext+RNA was Negative. Report date is 01/16/2023.   The [facility] CancerNext+RNAinsight Panel includes sequencing, rearrangement analysis, and RNA analysis for the following 39 genes: APC, ATM, BAP1, BARD1, BMPR1A, BRCA1, BRCA2, BRIP1, CDH1, CDKN2A, CHEK2, FH, FLCN, MET, MLH1, MSH2, MSH6, MUTYH, NF1, NTHL1, PALB2, PMS2, PTEN, RAD51C, RAD51D, SMAD4, STK11, TP53, TSC1, TSC2, and VHL (sequencing and deletion/duplication); AXIN2, HOXB13, MBD4, MSH3, POLD1 and POLE (sequencing only); EPCAM and GREM1 (deletion/duplication only).    Malignant neoplasm of upper-outer quadrant of left breast in female, estrogen receptor positive  (HCC)  12/31/2022 Initial Diagnosis   Malignant neoplasm of upper-outer quadrant of left breast in female, estrogen receptor positive (HCC)   01/01/2023 Cancer Staging   Staging form: Breast, AJCC 8th Edition - Clinical: Stage IIB (cT2, cN1(f), cM0, G3, ER+, PR+, HER2-) - Signed by Bettejane Brownie, PA-C on 01/01/2023 Method of lymph node assessment: Core biopsy Histologic grading system: 3 grade system   01/25/2023 -  Chemotherapy   Patient is on Treatment Plan : BREAST  Docetaxel  + Carboplatin  + Trastuzumab  + Pertuzumab   (TCHP) q21d       Genetic Testing   Amb[facility] CancerNext+RNA was Negative. Report date is 01/16/2023.   The [facility] CancerNext+RNAinsight Panel includes sequencing, rearrangement analysis, and RNA analysis for the following 39 genes: APC, ATM, BAP1, BARD1, BMPR1A, BRCA1, BRCA2, BRIP1, CDH1, CDKN2A, CHEK2, FH, FLCN, MET, MLH1, MSH2, MSH6, MUTYH, NF1, NTHL1, PALB2, PMS2, PTEN, RAD51C, RAD51D, SMAD4, STK11, TP53, TSC1, TSC2, and VHL (sequencing and deletion/duplication); AXIN2, HOXB13, MBD4, MSH3, POLD1 and POLE (sequencing only); EPCAM and GREM1 (deletion/duplication only).      CHIEF COMPLIANT: Follow-up after recent breast MRIs.  Herceptin  and Perjeta  maintenance  HISTORY OF PRESENT ILLNESS: History of Present Illness Donna [name] is a 73 year old female with breast cancer who presents for follow-up on her treatment plan.  She is undergoing treatment for breast cancer. A recent MRI shows significant improvement in the right breast with a reduction in cancer size to 1.2 cm. The left breast cancer remains unchanged, with concerns about skin and lymph node involvement.  She is currently on immunotherapy, experiencing less severe side effects compared to previous chemotherapy. She reports fatigue, agitation, and difficulty sleeping, which may be related to her treatment or stress.  She has not been taking blood thinners since leaving rehab, which were initially prescribed  there. She has a history of stroke, which she feels is affecting her recovery time. No known allergies.     ALLERGIES:  has no known allergies.  MEDICATIONS:  Current Outpatient Medications  Medication Sig Dispense Refill   albuterol  (VENTOLIN  HFA) 108 (90 Base) MCG/ACT inhaler Inhale 2 puffs into the lungs every 4 (four) hours as needed for wheezing or shortness of breath.     amLODipine  (NORVASC ) 10 MG tablet Take 1 tablet (10 mg total) by mouth daily.     aspirin  EC 81 MG EC tablet Take 1 tablet (81 mg total) by mouth daily. Swallow whole. 30 tablet 11   busPIRone  (BUSPAR ) 5 MG tablet Take 5 mg by mouth 2 (two) times daily.  0   clopidogrel  (PLAVIX ) 75 MG tablet Take 1 tablet (75 mg total) by mouth daily.     dexamethasone  (DECADRON ) 4 MG tablet Take 2 tabs by mouth 2 times daily starting day before chemo. Then take 2 tabs daily for 2 days starting day after chemo. Take with food. 30 tablet 1   DULoxetine  (CYMBALTA ) 60 MG capsule Take 60 mg by mouth daily.     fluticasone  (FLONASE ) 50 MCG/ACT nasal spray Place 1 spray into both nostrils daily.     furosemide  (LASIX ) 20 MG tablet TAKE 1 TABLET(20 MG) BY MOUTH DAILY 90 tablet 0   lidocaine -prilocaine  (EMLA ) cream Apply to affected area once 30 g 3   montelukast (SINGULAIR) 10 MG tablet Take 10 mg by mouth at bedtime.  0   Multiple Vitamins-Calcium  (ONE-A-DAY WOMENS FORMULA) TABS Take by mouth daily.     nicotine  (NICODERM CQ  - DOSED IN MG/24 HR) 7 mg/24hr patch Place 1 patch (7 mg total) onto the skin daily as needed (Nicotine  cravings). 28 patch 0   ondansetron  (ZOFRAN ) 8 MG tablet Take 1 tablet (8 mg total) by mouth every 8 (eight) hours as needed for nausea or vomiting. Start on the third day after chemotherapy. (Patient not taking: Reported on 05/08/2023) 30 tablet 1   prochlorperazine  (COMPAZINE ) 10 MG tablet Take 1 tablet (10 mg total) by mouth every 6 (six) hours as needed for nausea or vomiting. (Patient not taking: Reported on  05/08/2023) 30 tablet 1   rosuvastatin  (CRESTOR ) 20 MG tablet Take 1 tablet (20 mg total) by mouth daily.     traZODone  (DESYREL ) 50 MG tablet Take 50 mg by mouth at bedtime.  0   zolpidem  (AMBIEN ) 5 MG tablet Take 1 tablet (5 mg total) by mouth at bedtime as needed for sleep. 15 tablet 1   No current facility-administered medications for this visit.    PHYSICAL EXAMINATION: ECOG PERFORMANCE STATUS: 1 - Symptomatic but completely ambulatory  Vitals:   06/20/23 1414  BP: 139/62  Pulse: 83  Resp: 18  Temp: 97.6 F (36.4 C)  SpO2: 97%   Filed Weights   06/20/23 1414  Weight: 160 lb (72.6 kg)    LABORATORY DATA:  I have reviewed the data as listed    Latest Ref Rng & Units 06/20/2023    1:37 PM 05/29/2023    9:00 AM 05/08/2023    9:00 AM  CMP  Glucose 70 - 99 mg/dL 409  811  93   BUN 8 - 23 mg/dL 7  9  9    Creatinine 0.44 - 1.00 mg/dL 9.14  7.82  9.56   Sodium 135 - 145 mmol/L 140  142  142   Potassium 3.5 - 5.1 mmol/L 3.7  3.1  3.9   Chloride 98 - 111 mmol/L 110  111  112   CO2 22 - 32 mmol/L 27  27  27    Calcium  8.9 - 10.3 mg/dL 9.7  9.0  9.1   Total Protein 6.5 - 8.1 g/dL 6.5  6.0  5.7   Total Bilirubin 0.0 - 1.2 mg/dL 0.4  0.5  0.3   Alkaline Phos 38 - 126 U/L 82  87  89   AST 15 - 41 U/L 19  14  14    ALT 0 - 44 U/L 11  10  10      Lab Results  Component Value Date   WBC 9.0 06/20/2023   HGB 13.2 06/20/2023   HCT 40.2 06/20/2023   MCV 98.5 06/20/2023   PLT 196 06/20/2023   NEUTROABS 4.7 06/20/2023    ASSESSMENT & PLAN:  Malignant neoplasm of overlapping sites of right breast in female, estrogen receptor negative (HCC) 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%, HER2 negative, 1 lymph node positive Right breast: Spiculated mass and calcifications 2.7 cm at 11 o'clock position, additional calcifications 0.8  cm at 12 o'clock position, axilla negative, 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%, PR 0%, 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%, HER2 3+ positive   Treatment plan: Neoadjuvant TCHP x 5 cycles (stopped early for toxicities) followed by HP maintenance versus Kadcyla maintenance Breast conserving surgeries with targeted node surgery Adjuvant radiation Adjuvant antiestrogen therapy ------------------------------------------------------------------------------------------------------------------- Breast MRI 05/09/2023: Right breast: Significant decrease in the malignancy mild residual enhancement 1.2 cm, decrease in satellite nodules, left breast: 2.9 centimeter necrotic mass (previously was 3.2 cm) lymph node left axilla 0.5 cm  Continue with Herceptin  Perjeta  maintenance: Surgical planning: I sent a message to Dr. Alethea Andes about her surgery plan.  It appears that she will need a mastectomy on the left side by possibly a lumpectomy on the right versus bilateral mastectomies.  Apparently she has been referred to her primary care physician to get surgical clearance. She had a prior history of stroke. Assessment & Plan Malignant neoplasm of overlapping sites of right breast Right breast cancer reduced to 1.2 cm, positive treatment response. - Proceed with immunotherapy today. - Schedule next treatment on June 26th.  Malignant neoplasm of upper-outer quadrant of left breast Left breast cancer unchanged, involves skin and lymph node, mastectomy recommended. - Discuss surgical options with Dr. Alethea Andes. - Coordinate with primary doctor for surgery clearance, possibly before July 10th.      No orders of the defined types were placed in this encounter.  The patient has a good understanding of the overall plan. she agrees with it. she will call with any problems that may develop before the next visit here. Total time spent: 30 mins including face to face time and time spent for planning, charting and co-ordination of  care   Donna K Judeen Geralds, MD 06/20/23

## 2023-06-20 NOTE — Patient Instructions (Signed)
 [facility] - A DEPT OF [facility]  Discharge Instructions: Thank you for choosing [facility] to provide your oncology and hematology care.   If you have a lab appointment with the Cancer Center, please go directly to the Cancer Center and check in at the registration area.   Wear comfortable clothing and clothing appropriate for easy access to any Portacath or PICC line.   We strive to give you quality time with your provider. You may need to reschedule your appointment if you arrive late (15 or more minutes).  Arriving late affects you and other patients whose appointments are after yours.  Also, if you miss three or more appointments without notifying the office, you may be dismissed from the clinic at the provider's discretion.      For prescription refill requests, have your pharmacy contact our office and allow 72 hours for refills to be completed.    Today you received the following chemotherapy and/or immunotherapy agents herceptin      To help prevent nausea and vomiting after your treatment, we encourage you to take your nausea medication as directed.  BELOW ARE SYMPTOMS THAT SHOULD BE REPORTED IMMEDIATELY: *FEVER GREATER THAN 100.4 F (38 C) OR HIGHER *CHILLS OR SWEATING *NAUSEA AND VOMITING THAT IS NOT CONTROLLED WITH YOUR NAUSEA MEDICATION *UNUSUAL SHORTNESS OF BREATH *UNUSUAL BRUISING OR BLEEDING *URINARY PROBLEMS (pain or burning when urinating, or frequent urination) *BOWEL PROBLEMS (unusual diarrhea, constipation, pain near the anus) TENDERNESS IN MOUTH AND THROAT WITH OR WITHOUT PRESENCE OF ULCERS (sore throat, sores in mouth, or a toothache) UNUSUAL RASH, SWELLING OR PAIN  UNUSUAL VAGINAL DISCHARGE OR ITCHING   Items with * indicate a potential emergency and should be followed up as soon as possible or go to the Emergency Department if any problems should occur.  Please show the CHEMOTHERAPY ALERT CARD or IMMUNOTHERAPY  ALERT CARD at check-in to the Emergency Department and triage nurse.  Should you have questions after your visit or need to cancel or reschedule your appointment, please contact [facility] - A DEPT OF [facility]  Dept: [phone]  and follow the prompts.  Office hours are 8:00 a.m. to 4:30 p.m. Monday - Friday. Please note that voicemails left after 4:00 p.m. may not be returned until the following business day.  We are closed weekends and major holidays. You have access to a nurse at all times for urgent questions. Please call the main number to the clinic Dept: [phone] and follow the prompts.   For any non-urgent questions, you may also contact your provider using MyChart. We now offer e-Visits for anyone 2 and older to request care online for non-urgent symptoms. For details visit [email or web address].   Also download the MyChart app! Go to the app store, search "MyChart", open the app, select [place], and log in with your MyChart username and password.

## 2023-06-20 NOTE — Assessment & Plan Note (Signed)
 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%, HER2 3+ positive   Treatment plan: Neoadjuvant TCHP x 5 cycles (stopped early for toxicities) followed by HP maintenance versus Kadcyla maintenance Breast conserving surgeries with targeted node surgery Adjuvant radiation Adjuvant antiestrogen therapy ------------------------------------------------------------------------------------------------------------------- Breast MRI 05/09/2023: Right breast: Significant decrease in the malignancy mild residual enhancement 1.2 cm, decrease in satellite nodules, left breast: 2.9 centimeter necrotic mass (previously was 3.2 cm) lymph node left axilla 0.5 cm  Continue with Herceptin  Perjeta  maintenance:

## 2023-06-20 NOTE — Progress Notes (Signed)
 Received message from surgery team stating they scheduled pt an appt with her PCP, Dr. Dorothea Gata with [facility] [phone])  on July 10th at 2pm.  Appt is needed to discuss Plavix  and to obtain clearance for upcoming surgery.  RN provided pt with appt details, pt verbalized understanding.

## 2023-06-24 ENCOUNTER — Other Ambulatory Visit: Payer: Self-pay

## 2023-06-24 ENCOUNTER — Other Ambulatory Visit: Payer: Self-pay | Admitting: Hematology and Oncology

## 2023-06-24 DIAGNOSIS — Z171 Estrogen receptor negative status [ER-]: Secondary | ICD-10-CM

## 2023-06-24 DIAGNOSIS — C50412 Malignant neoplasm of upper-outer quadrant of left female breast: Secondary | ICD-10-CM

## 2023-06-25 ENCOUNTER — Other Ambulatory Visit: Payer: Self-pay

## 2023-07-01 ENCOUNTER — Other Ambulatory Visit: Payer: Self-pay

## 2023-07-10 NOTE — Assessment & Plan Note (Signed)
 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%, HER2 3+ positive   Treatment plan: Neoadjuvant TCHP x 5 cycles (stopped early for toxicities) followed by HP maintenance versus Kadcyla maintenance Breast conserving surgeries with targeted node surgery Adjuvant radiation Adjuvant antiestrogen therapy ------------------------------------------------------------------------------------------------------------------- Breast MRI 05/09/2023: Right breast: Significant decrease in the malignancy mild residual enhancement 1.2 cm, decrease in satellite nodules, left breast: 2.9 centimeter necrotic mass (previously was 3.2 cm) lymph node left axilla 0.5 cm   Continue with Herceptin  Perjeta  maintenance: Surgical planning: I sent a message to Dr. Curvin about her surgery plan.  It appears that she will need a mastectomy on the left side by possibly a lumpectomy on the right versus bilateral mastectomies.  Apparently she has been referred to her primary care physician to get surgical clearance. She had a prior history of stroke.

## 2023-07-11 ENCOUNTER — Inpatient Hospital Stay

## 2023-07-11 ENCOUNTER — Inpatient Hospital Stay (HOSPITAL_BASED_OUTPATIENT_CLINIC_OR_DEPARTMENT_OTHER): Admitting: Hematology and Oncology

## 2023-07-11 ENCOUNTER — Encounter: Payer: Self-pay | Admitting: *Deleted

## 2023-07-11 VITALS — BP 156/66 | HR 68 | Temp 97.5°F | Resp 16

## 2023-07-11 VITALS — BP 130/72 | HR 69 | Temp 97.6°F | Resp 17 | Ht 69.0 in | Wt 161.0 lb

## 2023-07-11 DIAGNOSIS — C50412 Malignant neoplasm of upper-outer quadrant of left female breast: Secondary | ICD-10-CM

## 2023-07-11 DIAGNOSIS — Z171 Estrogen receptor negative status [ER-]: Secondary | ICD-10-CM

## 2023-07-11 DIAGNOSIS — C50811 Malignant neoplasm of overlapping sites of right female breast: Secondary | ICD-10-CM

## 2023-07-11 DIAGNOSIS — Z5112 Encounter for antineoplastic immunotherapy: Secondary | ICD-10-CM | POA: Diagnosis not present

## 2023-07-11 LAB — CMP (CANCER CENTER ONLY)
ALT: 9 U/L (ref 0–44)
AST: 16 U/L (ref 15–41)
Albumin: 3.9 g/dL (ref 3.5–5.0)
Alkaline Phosphatase: 84 U/L (ref 38–126)
Anion gap: 5 (ref 5–15)
BUN: 8 mg/dL (ref 8–23)
CO2: 27 mmol/L (ref 22–32)
Calcium: 9.5 mg/dL (ref 8.9–10.3)
Chloride: 108 mmol/L (ref 98–111)
Creatinine: 0.71 mg/dL (ref 0.44–1.00)
GFR, Estimated: >60 mL/min (ref >60)
Glucose, Bld: 99 mg/dL (ref 70–99)
Potassium: 3.5 mmol/L (ref 3.5–5.1)
Sodium: 140 mmol/L (ref 135–145)
Total Bilirubin: 0.6 mg/dL (ref 0.0–1.2)
Total Protein: 6.4 g/dL — ABNORMAL LOW (ref 6.5–8.1)

## 2023-07-11 LAB — CBC WITH DIFFERENTIAL (CANCER CENTER ONLY)
Abs Immature Granulocytes: 0.02 10*3/uL (ref 0.00–0.07)
Basophils Absolute: 0 10*3/uL (ref 0.0–0.1)
Basophils Relative: 0 %
Eosinophils Absolute: 0.1 10*3/uL (ref 0.0–0.5)
Eosinophils Relative: 1 %
HCT: 40.1 % (ref 36.0–46.0)
Hemoglobin: 13.2 g/dL (ref 12.0–15.0)
Immature Granulocytes: 0 %
Lymphocytes Relative: 34 %
Lymphs Abs: 3.4 10*3/uL (ref 0.7–4.0)
MCH: 32.5 pg (ref 26.0–34.0)
MCHC: 32.9 g/dL (ref 30.0–36.0)
MCV: 98.8 fL (ref 80.0–100.0)
Monocytes Absolute: 0.5 10*3/uL (ref 0.1–1.0)
Monocytes Relative: 5 %
Neutro Abs: 5.9 10*3/uL (ref 1.7–7.7)
Neutrophils Relative %: 60 %
Platelet Count: 217 10*3/uL (ref 150–400)
RBC: 4.06 MIL/uL (ref 3.87–5.11)
RDW: 12.8 % (ref 11.5–15.5)
WBC Count: 10 10*3/uL (ref 4.0–10.5)
nRBC: 0 % (ref 0.0–0.2)

## 2023-07-11 MED ORDER — TRASTUZUMAB-ANNS CHEMO 150 MG IV SOLR
6.0000 mg/kg | Freq: Once | INTRAVENOUS | Status: AC
  Administered 2023-07-11: 420 mg via INTRAVENOUS
  Filled 2023-07-11: qty 20

## 2023-07-11 MED ORDER — SODIUM CHLORIDE 0.9 % IV SOLN: INTRAVENOUS | Status: DC

## 2023-07-11 MED ORDER — HEPARIN SOD (PORK) LOCK FLUSH 100 UNIT/ML IV SOLN
500.0000 [IU] | Freq: Once | INTRAVENOUS | Status: AC | PRN
  Administered 2023-07-11: 500 [IU]

## 2023-07-11 MED ORDER — ACETAMINOPHEN 325 MG PO TABS
650.0000 mg | ORAL_TABLET | Freq: Once | ORAL | Status: AC
  Administered 2023-07-11: 650 mg via ORAL
  Filled 2023-07-11: qty 2

## 2023-07-11 MED ORDER — SODIUM CHLORIDE 0.9% FLUSH
10.0000 mL | INTRAVENOUS | Status: DC | PRN
  Administered 2023-07-11: 10 mL

## 2023-07-11 MED ORDER — DIPHENHYDRAMINE HCL 25 MG PO CAPS
25.0000 mg | ORAL_CAPSULE | Freq: Once | ORAL | Status: AC
Start: 2023-07-11 — End: 2023-07-11
  Administered 2023-07-11: 25 mg via ORAL
  Filled 2023-07-11: qty 1

## 2023-07-11 MED ORDER — LORAZEPAM 0.5 MG PO TABS
0.5000 mg | ORAL_TABLET | Freq: Every evening | ORAL | 1 refills | Status: AC | PRN
Start: 2023-07-11 — End: ?

## 2023-07-11 NOTE — Patient Instructions (Signed)
 [facility] - A DEPT OF [facility]  Discharge Instructions: Thank you for choosing [facility] to provide your oncology and hematology care.   If you have a lab appointment with the Cancer Center, please go directly to the Cancer Center and check in at the registration area.   Wear comfortable clothing and clothing appropriate for easy access to any Portacath or PICC line.   We strive to give you quality time with your provider. You may need to reschedule your appointment if you arrive late (15 or more minutes).  Arriving late affects you and other patients whose appointments are after yours.  Also, if you miss three or more appointments without notifying the office, you may be dismissed from the clinic at the provider's discretion.      For prescription refill requests, have your pharmacy contact our office and allow 72 hours for refills to be completed.    Today you received the following chemotherapy and/or immunotherapy agents herceptin      To help prevent nausea and vomiting after your treatment, we encourage you to take your nausea medication as directed.  BELOW ARE SYMPTOMS THAT SHOULD BE REPORTED IMMEDIATELY: *FEVER GREATER THAN 100.4 F (38 C) OR HIGHER *CHILLS OR SWEATING *NAUSEA AND VOMITING THAT IS NOT CONTROLLED WITH YOUR NAUSEA MEDICATION *UNUSUAL SHORTNESS OF BREATH *UNUSUAL BRUISING OR BLEEDING *URINARY PROBLEMS (pain or burning when urinating, or frequent urination) *BOWEL PROBLEMS (unusual diarrhea, constipation, pain near the anus) TENDERNESS IN MOUTH AND THROAT WITH OR WITHOUT PRESENCE OF ULCERS (sore throat, sores in mouth, or a toothache) UNUSUAL RASH, SWELLING OR PAIN  UNUSUAL VAGINAL DISCHARGE OR ITCHING   Items with * indicate a potential emergency and should be followed up as soon as possible or go to the Emergency Department if any problems should occur.  Please show the CHEMOTHERAPY ALERT CARD or IMMUNOTHERAPY  ALERT CARD at check-in to the Emergency Department and triage nurse.  Should you have questions after your visit or need to cancel or reschedule your appointment, please contact [facility] - A DEPT OF [facility]  Dept: [phone]  and follow the prompts.  Office hours are 8:00 a.m. to 4:30 p.m. Monday - Friday. Please note that voicemails left after 4:00 p.m. may not be returned until the following business day.  We are closed weekends and major holidays. You have access to a nurse at all times for urgent questions. Please call the main number to the clinic Dept: [phone] and follow the prompts.   For any non-urgent questions, you may also contact your provider using MyChart. We now offer e-Visits for anyone 2 and older to request care online for non-urgent symptoms. For details visit [email or web address].   Also download the MyChart app! Go to the app store, search "MyChart", open the app, select [place], and log in with your MyChart username and password.

## 2023-07-11 NOTE — Progress Notes (Signed)
 Patient Care Team: Care, Janit Griffins Total Access as PCP - General (Family Medicine) Tyree Nanetta SAILOR, RN as Oncology Nurse Navigator Glean, Stephane BROCKS, RN (Inactive) as Oncology Nurse Navigator Curvin Deward MOULD, MD as Consulting Physician (General Surgery) Odean Potts, MD as Consulting Physician (Hematology and Oncology) Dewey Rush, MD as Consulting Physician (Radiation Oncology)  DIAGNOSIS:  Encounter Diagnosis  Name Primary?   Malignant neoplasm of overlapping sites of right breast in female, estrogen receptor negative (HCC) Yes    SUMMARY OF ONCOLOGIC HISTORY: Oncology History  Malignant neoplasm of overlapping sites of right breast in female, estrogen receptor negative (HCC)  12/31/2022 Initial Diagnosis   Malignant neoplasm of overlapping sites of right breast in female, estrogen receptor negative (HCC)   01/01/2023 Cancer Staging   Staging form: Breast, AJCC 8th Edition - Clinical stage from 01/01/2023: Stage IIA (cT2, cN0, cM0, G3, ER-, PR-, HER2+) - Signed by Odean Potts, MD on 01/02/2023 Stage prefix: Initial diagnosis Method of lymph node assessment: Clinical Histologic grading system: 3 grade system   01/25/2023 -  Chemotherapy   Patient is on Treatment Plan : BREAST  Docetaxel  + Carboplatin  + Trastuzumab  + Pertuzumab   (TCHP) q21d       Genetic Testing   Ambry CancerNext+RNA was Negative. Report date is 01/16/2023.   The [facility] CancerNext+RNAinsight Panel includes sequencing, rearrangement analysis, and RNA analysis for the following 39 genes: APC, ATM, BAP1, BARD1, BMPR1A, BRCA1, BRCA2, BRIP1, CDH1, CDKN2A, CHEK2, FH, FLCN, MET, MLH1, MSH2, MSH6, MUTYH, NF1, NTHL1, PALB2, PMS2, PTEN, RAD51C, RAD51D, SMAD4, STK11, TP53, TSC1, TSC2, and VHL (sequencing and deletion/duplication); AXIN2, HOXB13, MBD4, MSH3, POLD1 and POLE (sequencing only); EPCAM and GREM1 (deletion/duplication only).    Malignant neoplasm of upper-outer quadrant of left breast in female, estrogen  receptor positive (HCC)  12/31/2022 Initial Diagnosis   Malignant neoplasm of upper-outer quadrant of left breast in female, estrogen receptor positive (HCC)   01/01/2023 Cancer Staging   Staging form: Breast, AJCC 8th Edition - Clinical: Stage IIB (cT2, cN1(f), cM0, G3, ER+, PR+, HER2-) - Signed by Lanell Donald Stagger, PA-C on 01/01/2023 Method of lymph node assessment: Core biopsy Histologic grading system: 3 grade system   01/25/2023 -  Chemotherapy   Patient is on Treatment Plan : BREAST  Docetaxel  + Carboplatin  + Trastuzumab  + Pertuzumab   (TCHP) q21d       Genetic Testing   Ambry CancerNext+RNA was Negative. Report date is 01/16/2023.   The [facility] CancerNext+RNAinsight Panel includes sequencing, rearrangement analysis, and RNA analysis for the following 39 genes: APC, ATM, BAP1, BARD1, BMPR1A, BRCA1, BRCA2, BRIP1, CDH1, CDKN2A, CHEK2, FH, FLCN, MET, MLH1, MSH2, MSH6, MUTYH, NF1, NTHL1, PALB2, PMS2, PTEN, RAD51C, RAD51D, SMAD4, STK11, TP53, TSC1, TSC2, and VHL (sequencing and deletion/duplication); AXIN2, HOXB13, MBD4, MSH3, POLD1 and POLE (sequencing only); EPCAM and GREM1 (deletion/duplication only).      CHIEF COMPLIANT: Herceptin  Perjeta  maintenance  HISTORY OF PRESENT ILLNESS:   History of Present Illness Donna [name] is a 73 year old female with breast cancer who presents for follow-up regarding her ongoing treatment and upcoming surgery.  She is currently receiving Herceptin , which causes insomnia and frequent urination. She experiences a sensation of being 'wired up' after treatments, leading to difficulty sleeping and irritability. Trazodone  has been prescribed for sleep but is ineffective. She has previously used Ambien  but is not currently taking it.  She is awaiting surgical clearance and is concerned about managing Plavix  in relation to the surgery. The lump in her breast is 'not as  hard as it was.'  Herceptin  infusions are scheduled every three weeks.      ALLERGIES:  has no known allergies.  MEDICATIONS:  Current Outpatient Medications  Medication Sig Dispense Refill   albuterol  (VENTOLIN  HFA) 108 (90 Base) MCG/ACT inhaler Inhale 2 puffs into the lungs every 4 (four) hours as needed for wheezing or shortness of breath.     amLODipine  (NORVASC ) 10 MG tablet Take 1 tablet (10 mg total) by mouth daily.     aspirin  EC 81 MG EC tablet Take 1 tablet (81 mg total) by mouth daily. Swallow whole. 30 tablet 11   busPIRone  (BUSPAR ) 5 MG tablet Take 5 mg by mouth 2 (two) times daily.  0   clopidogrel  (PLAVIX ) 75 MG tablet Take 1 tablet (75 mg total) by mouth daily.     DULoxetine  (CYMBALTA ) 60 MG capsule Take 60 mg by mouth daily.     fluticasone  (FLONASE ) 50 MCG/ACT nasal spray Place 1 spray into both nostrils daily.     furosemide  (LASIX ) 20 MG tablet TAKE 1 TABLET(20 MG) BY MOUTH DAILY 90 tablet 0   lidocaine -prilocaine  (EMLA ) cream Apply to affected area once 30 g 3   LORazepam (ATIVAN) 0.5 MG tablet Take 1 tablet (0.5 mg total) by mouth at bedtime as needed for anxiety. 30 tablet 1   montelukast (SINGULAIR) 10 MG tablet Take 10 mg by mouth at bedtime.  0   Multiple Vitamins-Calcium  (ONE-A-DAY WOMENS FORMULA) TABS Take by mouth daily.     nicotine  (NICODERM CQ  - DOSED IN MG/24 HR) 7 mg/24hr patch Place 1 patch (7 mg total) onto the skin daily as needed (Nicotine  cravings). 28 patch 0   ondansetron  (ZOFRAN ) 8 MG tablet Take 1 tablet (8 mg total) by mouth every 8 (eight) hours as needed for nausea or vomiting. Start on the third day after chemotherapy. 30 tablet 1   prochlorperazine  (COMPAZINE ) 10 MG tablet Take 1 tablet (10 mg total) by mouth every 6 (six) hours as needed for nausea or vomiting. 30 tablet 1   rosuvastatin  (CRESTOR ) 20 MG tablet Take 1 tablet (20 mg total) by mouth daily.     No current facility-administered medications for this visit.    PHYSICAL EXAMINATION: ECOG PERFORMANCE STATUS: 1 - Symptomatic but completely  ambulatory  Vitals:   07/11/23 1108  BP: 130/72  Pulse: 69  Resp: 17  Temp: 97.6 F (36.4 C)  SpO2: 98%   Filed Weights   07/11/23 1108  Weight: 161 lb (73 kg)      LABORATORY DATA:  I have reviewed the data as listed    Latest Ref Rng & Units 06/20/2023    1:37 PM 05/29/2023    9:00 AM 05/08/2023    9:00 AM  CMP  Glucose 70 - 99 mg/dL 885  891  93   BUN 8 - 23 mg/dL 7  9  9    Creatinine 0.44 - 1.00 mg/dL 9.22  9.22  9.26   Sodium 135 - 145 mmol/L 140  142  142   Potassium 3.5 - 5.1 mmol/L 3.7  3.1  3.9   Chloride 98 - 111 mmol/L 110  111  112   CO2 22 - 32 mmol/L 27  27  27    Calcium  8.9 - 10.3 mg/dL 9.7  9.0  9.1   Total Protein 6.5 - 8.1 g/dL 6.5  6.0  5.7   Total Bilirubin 0.0 - 1.2 mg/dL 0.4  0.5  0.3   Alkaline Phos 38 - 126  U/L 82  87  89   AST 15 - 41 U/L 19  14  14    ALT 0 - 44 U/L 11  10  10      Lab Results  Component Value Date   WBC 10.0 07/11/2023   HGB 13.2 07/11/2023   HCT 40.1 07/11/2023   MCV 98.8 07/11/2023   PLT 217 07/11/2023   NEUTROABS 5.9 07/11/2023    ASSESSMENT & PLAN:  Malignant neoplasm of overlapping sites of right breast in female, estrogen receptor negative (HCC) 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%, HER2 3+ positive   Treatment plan: Neoadjuvant TCHP x 5 cycles (stopped early for toxicities) followed by HP maintenance versus Kadcyla maintenance Breast conserving surgeries with targeted node surgery Adjuvant radiation Adjuvant antiestrogen  therapy ------------------------------------------------------------------------------------------------------------------- Breast MRI 05/09/2023: Right breast: Significant decrease in the malignancy mild residual enhancement 1.2 cm, decrease in satellite nodules, left breast: 2.9 centimeter necrotic mass (previously was 3.2 cm) lymph node left axilla 0.5 cm   Continue with Herceptin  Perjeta  maintenance: Surgical planning: I sent a message to Dr. Curvin about her surgery plan.  It appears that she will need a mastectomy on the left side by possibly a lumpectomy on the right versus bilateral mastectomies.  Apparently she has been referred to her primary care physician to get surgical clearance. She had a prior history of stroke. ------------------------------------- Assessment and Plan Assessment & Plan Malignant neoplasm of breast Completed chemotherapy, currently on Herceptin  with minimal side effects. MRI shows significant improvement. Blood work normal, suitable for surgery. - Continue Herceptin  infusions every three weeks for one year. - Proceed with surgery after clearance from primary doctor. - Plan for radiation therapy post-surgery once healing is complete.  Insomnia Insomnia persists, possibly due to stress, anxiety, or Herceptin . Previous medications ineffective.  Lorazepam was prescribed - Discontinue Trazodone .      No orders of the defined types were placed in this encounter.  The patient has a good understanding of the overall plan. she agrees with it. she will call with any problems that may develop before the next visit here. Total time spent: 30 mins including face to face time and time spent for planning, charting and co-ordination of care   Naomi MARLA Chad, MD 07/11/23

## 2023-07-29 ENCOUNTER — Encounter: Payer: Self-pay | Admitting: *Deleted

## 2023-07-31 NOTE — Assessment & Plan Note (Signed)
 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%, HER2 3+ positive   Treatment plan: Neoadjuvant TCHP x 5 cycles (stopped early for toxicities) followed by HP maintenance versus Kadcyla maintenance Breast conserving surgeries with targeted node surgery Adjuvant radiation Adjuvant antiestrogen therapy ------------------------------------------------------------------------------------------------------------------- Breast MRI 05/09/2023: Right breast: Significant decrease in the malignancy mild residual enhancement 1.2 cm, decrease in satellite nodules, left breast: 2.9 centimeter necrotic mass (previously was 3.2 cm) lymph node left axilla 0.5 cm   Continue with Herceptin  Perjeta  maintenance: Surgical planning: It appears that she will need a mastectomy on the left side by possibly a lumpectomy on the right versus bilateral mastectomies.  Apparently she has been referred to her primary care physician to get surgical clearance. She had a prior history of stroke.

## 2023-08-01 ENCOUNTER — Inpatient Hospital Stay (HOSPITAL_BASED_OUTPATIENT_CLINIC_OR_DEPARTMENT_OTHER): Admitting: Hematology and Oncology

## 2023-08-01 ENCOUNTER — Inpatient Hospital Stay: Attending: Hematology and Oncology

## 2023-08-01 VITALS — BP 177/63 | HR 75 | Temp 97.7°F | Resp 19 | Wt 158.4 lb

## 2023-08-01 DIAGNOSIS — Z170 Estrogen receptor positive status [ER+]: Secondary | ICD-10-CM

## 2023-08-01 DIAGNOSIS — C50811 Malignant neoplasm of overlapping sites of right female breast: Secondary | ICD-10-CM

## 2023-08-01 DIAGNOSIS — Z7982 Long term (current) use of aspirin: Secondary | ICD-10-CM | POA: Insufficient documentation

## 2023-08-01 DIAGNOSIS — G4700 Insomnia, unspecified: Secondary | ICD-10-CM | POA: Diagnosis not present

## 2023-08-01 DIAGNOSIS — Z1722 Progesterone receptor negative status: Secondary | ICD-10-CM | POA: Diagnosis not present

## 2023-08-01 DIAGNOSIS — Z5111 Encounter for antineoplastic chemotherapy: Secondary | ICD-10-CM | POA: Diagnosis not present

## 2023-08-01 DIAGNOSIS — Z1721 Progesterone receptor positive status: Secondary | ICD-10-CM | POA: Insufficient documentation

## 2023-08-01 DIAGNOSIS — Z79899 Other long term (current) drug therapy: Secondary | ICD-10-CM | POA: Diagnosis not present

## 2023-08-01 DIAGNOSIS — C50412 Malignant neoplasm of upper-outer quadrant of left female breast: Secondary | ICD-10-CM | POA: Insufficient documentation

## 2023-08-01 DIAGNOSIS — Z7902 Long term (current) use of antithrombotics/antiplatelets: Secondary | ICD-10-CM | POA: Insufficient documentation

## 2023-08-01 DIAGNOSIS — Z95828 Presence of other vascular implants and grafts: Secondary | ICD-10-CM

## 2023-08-01 DIAGNOSIS — Z171 Estrogen receptor negative status [ER-]: Secondary | ICD-10-CM | POA: Insufficient documentation

## 2023-08-01 DIAGNOSIS — Z5112 Encounter for antineoplastic immunotherapy: Secondary | ICD-10-CM | POA: Diagnosis present

## 2023-08-01 DIAGNOSIS — Z1732 Human epidermal growth factor receptor 2 negative status: Secondary | ICD-10-CM | POA: Diagnosis not present

## 2023-08-01 DIAGNOSIS — I10 Essential (primary) hypertension: Secondary | ICD-10-CM | POA: Diagnosis not present

## 2023-08-01 LAB — CMP (CANCER CENTER ONLY)
ALT: 8 U/L (ref 0–44)
AST: 16 U/L (ref 15–41)
Albumin: 3.7 g/dL (ref 3.5–5.0)
Alkaline Phosphatase: 91 U/L (ref 38–126)
Anion gap: 3 — ABNORMAL LOW (ref 5–15)
BUN: 14 mg/dL (ref 8–23)
CO2: 29 mmol/L (ref 22–32)
Calcium: 9.7 mg/dL (ref 8.9–10.3)
Chloride: 113 mmol/L — ABNORMAL HIGH (ref 98–111)
Creatinine: 0.75 mg/dL (ref 0.44–1.00)
GFR, Estimated: >60 mL/min (ref >60)
Glucose, Bld: 107 mg/dL — ABNORMAL HIGH (ref 70–99)
Potassium: 3.7 mmol/L (ref 3.5–5.1)
Sodium: 145 mmol/L (ref 135–145)
Total Bilirubin: 0.5 mg/dL (ref 0.0–1.2)
Total Protein: 6.1 g/dL — ABNORMAL LOW (ref 6.5–8.1)

## 2023-08-01 LAB — CBC WITH DIFFERENTIAL (CANCER CENTER ONLY)
Abs Immature Granulocytes: 0.02 K/uL (ref 0.00–0.07)
Basophils Absolute: 0.1 K/uL (ref 0.0–0.1)
Basophils Relative: 0 %
Eosinophils Absolute: 0.1 K/uL (ref 0.0–0.5)
Eosinophils Relative: 1 %
HCT: 39.8 % (ref 36.0–46.0)
Hemoglobin: 13.3 g/dL (ref 12.0–15.0)
Immature Granulocytes: 0 %
Lymphocytes Relative: 20 %
Lymphs Abs: 2.4 K/uL (ref 0.7–4.0)
MCH: 32.8 pg (ref 26.0–34.0)
MCHC: 33.4 g/dL (ref 30.0–36.0)
MCV: 98 fL (ref 80.0–100.0)
Monocytes Absolute: 0.6 K/uL (ref 0.1–1.0)
Monocytes Relative: 5 %
Neutro Abs: 8.6 K/uL — ABNORMAL HIGH (ref 1.7–7.7)
Neutrophils Relative %: 74 %
Platelet Count: 200 K/uL (ref 150–400)
RBC: 4.06 MIL/uL (ref 3.87–5.11)
RDW: 12.9 % (ref 11.5–15.5)
WBC Count: 11.7 K/uL — ABNORMAL HIGH (ref 4.0–10.5)
nRBC: 0 % (ref 0.0–0.2)

## 2023-08-01 MED ORDER — SODIUM CHLORIDE 0.9% FLUSH
10.0000 mL | Freq: Once | INTRAVENOUS | Status: AC
  Administered 2023-08-01: 10 mL

## 2023-08-01 MED ORDER — ACETAMINOPHEN 325 MG PO TABS
650.0000 mg | ORAL_TABLET | Freq: Once | ORAL | Status: AC
  Administered 2023-08-01: 650 mg via ORAL
  Filled 2023-08-01: qty 2

## 2023-08-01 MED ORDER — HEPARIN SOD (PORK) LOCK FLUSH 100 UNIT/ML IV SOLN: 500.0000 [IU] | Freq: Once | INTRAVENOUS | Status: DC | PRN

## 2023-08-01 MED ORDER — TRASTUZUMAB-ANNS CHEMO 150 MG IV SOLR
6.0000 mg/kg | Freq: Once | INTRAVENOUS | Status: AC
  Administered 2023-08-01: 420 mg via INTRAVENOUS
  Filled 2023-08-01: qty 20

## 2023-08-01 MED ORDER — SODIUM CHLORIDE 0.9% FLUSH: 10.0000 mL | INTRAVENOUS | Status: DC | PRN

## 2023-08-01 MED ORDER — AMLODIPINE BESYLATE 10 MG PO TABS: 10.0000 mg | ORAL_TABLET | Freq: Every day | ORAL | 3 refills | Status: DC

## 2023-08-01 MED ORDER — SODIUM CHLORIDE 0.9 % IV SOLN: INTRAVENOUS | Status: DC

## 2023-08-01 MED ORDER — DIPHENHYDRAMINE HCL 25 MG PO CAPS
25.0000 mg | ORAL_CAPSULE | Freq: Once | ORAL | Status: AC
  Administered 2023-08-01: 25 mg via ORAL
  Filled 2023-08-01: qty 1

## 2023-08-01 NOTE — Progress Notes (Signed)
 Patient Care Team: Care, Janit Griffins Total Access as PCP - General (Family Medicine) Tyree Nanetta SAILOR, RN as Oncology Nurse Navigator Glean, Stephane BROCKS, RN (Inactive) as Oncology Nurse Navigator Curvin Deward MOULD, MD as Consulting Physician (General Surgery) Odean Potts, MD as Consulting Physician (Hematology and Oncology) Dewey Rush, MD as Consulting Physician (Radiation Oncology)  DIAGNOSIS:  Encounter Diagnosis  Name Primary?   Malignant neoplasm of overlapping sites of right breast in female, estrogen receptor negative (HCC) Yes    SUMMARY OF ONCOLOGIC HISTORY: Oncology History  Malignant neoplasm of overlapping sites of right breast in female, estrogen receptor negative (HCC)  12/31/2022 Initial Diagnosis   Malignant neoplasm of overlapping sites of right breast in female, estrogen receptor negative (HCC)   01/01/2023 Cancer Staging   Staging form: Breast, AJCC 8th Edition - Clinical stage from 01/01/2023: Stage IIA (cT2, cN0, cM0, G3, ER-, PR-, HER2+) - Signed by Odean Potts, MD on 01/02/2023 Stage prefix: Initial diagnosis Method of lymph node assessment: Clinical Histologic grading system: 3 grade system   01/25/2023 -  Chemotherapy   Patient is on Treatment Plan : BREAST  Docetaxel  + Carboplatin  + Trastuzumab  + Pertuzumab   (TCHP) q21d       Genetic Testing   [facility] CancerNext+RNA was Negative. Report date is 01/16/2023.   The [facility] CancerNext+RNAinsight Panel includes sequencing, rearrangement analysis, and RNA analysis for the following 39 genes: APC, ATM, BAP1, BARD1, BMPR1A, BRCA1, BRCA2, BRIP1, CDH1, CDKN2A, CHEK2, FH, FLCN, MET, MLH1, MSH2, MSH6, MUTYH, NF1, NTHL1, PALB2, PMS2, PTEN, RAD51C, RAD51D, SMAD4, STK11, TP53, TSC1, TSC2, and VHL (sequencing and deletion/duplication); AXIN2, HOXB13, MBD4, MSH3, POLD1 and POLE (sequencing only); EPCAM and GREM1 (deletion/duplication only).    Malignant neoplasm of upper-outer quadrant of left breast in female, estrogen  receptor positive (HCC)  12/31/2022 Initial Diagnosis   Malignant neoplasm of upper-outer quadrant of left breast in female, estrogen receptor positive (HCC)   01/01/2023 Cancer Staging   Staging form: Breast, AJCC 8th Edition - Clinical: Stage IIB (cT2, cN1(f), cM0, G3, ER+, PR+, HER2-) - Signed by Lanell Donald Stagger, PA-C on 01/01/2023 Method of lymph node assessment: Core biopsy Histologic grading system: 3 grade system   01/25/2023 -  Chemotherapy   Patient is on Treatment Plan : BREAST  Docetaxel  + Carboplatin  + Trastuzumab  + Pertuzumab   (TCHP) q21d       Genetic Testing   [facility] CancerNext+RNA was Negative. Report date is 01/16/2023.   The [facility] CancerNext+RNAinsight Panel includes sequencing, rearrangement analysis, and RNA analysis for the following 39 genes: APC, ATM, BAP1, BARD1, BMPR1A, BRCA1, BRCA2, BRIP1, CDH1, CDKN2A, CHEK2, FH, FLCN, MET, MLH1, MSH2, MSH6, MUTYH, NF1, NTHL1, PALB2, PMS2, PTEN, RAD51C, RAD51D, SMAD4, STK11, TP53, TSC1, TSC2, and VHL (sequencing and deletion/duplication); AXIN2, HOXB13, MBD4, MSH3, POLD1 and POLE (sequencing only); EPCAM and GREM1 (deletion/duplication only).      CHIEF COMPLIANT: Follow-up to discuss treatment plan, Herceptin  and Perjeta  maintenance  HISTORY OF PRESENT ILLNESS:   History of Present Illness Donna [name] is a 73 year old female with breast cancer who presents for follow-up regarding her treatment plan.  The tumor on the left side remains at 3.2 cm, indicating insufficient response to treatment, while the right side has responded well. She experiences increased urination, insomnia, and fatigue, which she attributes to previous chemotherapy. Fatigue is gradually improving. She takes Ativan  for anxiety and sleep, and amlodipine  10 mg for blood pressure. There was a discrepancy in receiving her blood pressure medication. She is anxious about the potential  need for a mastectomy and concerned about the aesthetic outcome. Her  treatment regimen includes Taxocarbo and Zarzia every three weeks.     ALLERGIES:  has no known allergies.  MEDICATIONS:  Current Outpatient Medications  Medication Sig Dispense Refill   albuterol  (VENTOLIN  HFA) 108 (90 Base) MCG/ACT inhaler Inhale 2 puffs into the lungs every 4 (four) hours as needed for wheezing or shortness of breath.     amLODipine  (NORVASC ) 10 MG tablet Take 1 tablet (10 mg total) by mouth daily. 90 tablet 3   aspirin  EC 81 MG EC tablet Take 1 tablet (81 mg total) by mouth daily. Swallow whole. 30 tablet 11   busPIRone  (BUSPAR ) 5 MG tablet Take 5 mg by mouth 2 (two) times daily.  0   clopidogrel  (PLAVIX ) 75 MG tablet Take 1 tablet (75 mg total) by mouth daily.     DULoxetine  (CYMBALTA ) 60 MG capsule Take 60 mg by mouth daily.     fluticasone  (FLONASE ) 50 MCG/ACT nasal spray Place 1 spray into both nostrils daily.     furosemide  (LASIX ) 20 MG tablet TAKE 1 TABLET(20 MG) BY MOUTH DAILY 90 tablet 0   lidocaine -prilocaine  (EMLA ) cream Apply to affected area once 30 g 3   LORazepam  (ATIVAN ) 0.5 MG tablet Take 1 tablet (0.5 mg total) by mouth at bedtime as needed for anxiety. 30 tablet 1   montelukast (SINGULAIR) 10 MG tablet Take 10 mg by mouth at bedtime.  0   Multiple Vitamins-Calcium  (ONE-A-DAY WOMENS FORMULA) TABS Take by mouth daily.     nicotine  (NICODERM CQ  - DOSED IN MG/24 HR) 7 mg/24hr patch Place 1 patch (7 mg total) onto the skin daily as needed (Nicotine  cravings). 28 patch 0   ondansetron  (ZOFRAN ) 8 MG tablet Take 1 tablet (8 mg total) by mouth every 8 (eight) hours as needed for nausea or vomiting. Start on the third day after chemotherapy. 30 tablet 1   prochlorperazine  (COMPAZINE ) 10 MG tablet Take 1 tablet (10 mg total) by mouth every 6 (six) hours as needed for nausea or vomiting. 30 tablet 1   rosuvastatin  (CRESTOR ) 20 MG tablet Take 1 tablet (20 mg total) by mouth daily.     No current facility-administered medications for this visit.    Facility-Administered Medications Ordered in Other Visits  Medication Dose Route Frequency Provider Last Rate Last Admin   0.9 %  sodium chloride  infusion   Intravenous Continuous Carmon Brigandi, MD   Stopped at 08/01/23 1237   heparin  lock flush 100 unit/mL  500 Units Intracatheter Once PRN Odean Potts, MD       sodium chloride  flush (NS) 0.9 % injection 10 mL  10 mL Intracatheter PRN Odean Potts, MD        PHYSICAL EXAMINATION: ECOG PERFORMANCE STATUS: 1 - Symptomatic but completely ambulatory  Vitals:   08/01/23 1040  BP: (!) 177/63  Pulse: 75  Resp: 19  Temp: 97.7 F (36.5 C)  SpO2: 97%   Filed Weights   08/01/23 1040  Weight: 158 lb 6.4 oz (71.8 kg)      LABORATORY DATA:  I have reviewed the data as listed    Latest Ref Rng & Units 08/01/2023   10:19 AM 07/11/2023   10:49 AM 06/20/2023    1:37 PM  CMP  Glucose 70 - 99 mg/dL 892  99  885   BUN 8 - 23 mg/dL 14  8  7    Creatinine 0.44 - 1.00 mg/dL 9.24  9.28  9.22  Sodium 135 - 145 mmol/L 145  140  140   Potassium 3.5 - 5.1 mmol/L 3.7  3.5  3.7   Chloride 98 - 111 mmol/L 113  108  110   CO2 22 - 32 mmol/L 29  27  27    Calcium  8.9 - 10.3 mg/dL 9.7  9.5  9.7   Total Protein 6.5 - 8.1 g/dL 6.1  6.4  6.5   Total Bilirubin 0.0 - 1.2 mg/dL 0.5  0.6  0.4   Alkaline Phos 38 - 126 U/L 91  84  82   AST 15 - 41 U/L 16  16  19    ALT 0 - 44 U/L 8  9  11      Lab Results  Component Value Date   WBC 11.7 (H) 08/01/2023   HGB 13.3 08/01/2023   HCT 39.8 08/01/2023   MCV 98.0 08/01/2023   PLT 200 08/01/2023   NEUTROABS 8.6 (H) 08/01/2023    ASSESSMENT & PLAN:  Malignant neoplasm of overlapping sites of right breast in female, estrogen receptor negative (HCC) 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%, HER2 negative, 1 lymph node positive Right breast: Spiculated  mass and calcifications 2.7 cm at 11 o'clock position, additional calcifications 0.8 cm at 12 o'clock position, axilla negative, 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%, PR 0%, 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%, HER2 3+ positive   Treatment plan: Neoadjuvant TCHP x 5 cycles (stopped early for toxicities) followed by HP maintenance versus Kadcyla maintenance Breast conserving surgeries with targeted node surgery Adjuvant radiation Adjuvant antiestrogen therapy ------------------------------------------------------------------------------------------------------------------- Breast MRI 05/09/2023: Right breast: Significant decrease in the malignancy mild residual enhancement 1.2 cm, decrease in satellite nodules, left breast: 2.9 centimeter necrotic mass (previously was 3.2 cm) lymph node left axilla 0.5 cm   Continue with Herceptin  Perjeta  maintenance: Surgical planning: She most likely will need a mastectomy on the left side and a lumpectomy right side.  She wanted me to discuss with Dr. Curvin if another lumpectomy on the left side is an option.  I sent Dr. Curvin a message.  She had a prior history of stroke. ------------------------------------- Assessment and Plan Assessment & Plan Malignant neoplasm of left breast, estrogen receptor positive The left breast cancer has not responded adequately to treatment, with only minimal shrinkage from 3.2 cm. - Recommend mastectomy to prevent recurrence and ensure a cure. - Discuss surgical options with the surgeon, emphasizing the necessity of mastectomy due to insufficient tumor shrinkage. - Coordinate with the surgeon to finalize surgical plans.  Malignant neoplasm of right breast, estrogen receptor negative The right breast cancer has responded well to treatment, with significant shrinkage observed. - Discuss surgical options with the surgeon, including the possibility of a smaller surgery on the right side. - Ensure she understands the option to save the right  breast due to significant tumor shrinkage. - Coordinate with the surgeon to finalize surgical plans.  Insomnia Experiencing insomnia, potentially exacerbated by ongoing cancer treatment and associated anxiety. - Ensure Lorazepam  (Ativan ) prescription is filled and taken at bedtime for sleep. - Address any prescription issues with the pharmacy to ensure medication availability.  Hypertension Running out of amlodipine  10 mg and needs a refill. - Send a three-month supply of amlodipine  10 mg with refills to [facility] on [address].      No orders of the defined types were placed in  this encounter.  The patient has a good understanding of the overall plan. she agrees with it. she will call with any problems that may develop before the next visit here. Total time spent: 30 mins including face to face time and time spent for planning, charting and co-ordination of care   Viinay K Leticia Coletta, MD 08/01/23

## 2023-08-01 NOTE — Patient Instructions (Addendum)
 [facility] - A DEPT OF [facility]  Discharge Instructions: Thank you for choosing [facility] to provide your oncology and hematology care.   If you have a lab appointment with the Cancer Center, please go directly to the Cancer Center and check in at the registration area.   Wear comfortable clothing and clothing appropriate for easy access to any Portacath or PICC line.   We strive to give you quality time with your provider. You may need to reschedule your appointment if you arrive late (15 or more minutes).  Arriving late affects you and other patients whose appointments are after yours.  Also, if you miss three or more appointments without notifying the office, you may be dismissed from the clinic at the provider's discretion.      For prescription refill requests, have your pharmacy contact our office and allow 72 hours for refills to be completed.    Today you received the following chemotherapy and/or immunotherapy agents: Trastuzumab -anns (Kanjinti )    To help prevent nausea and vomiting after your treatment, we encourage you to take your nausea medication as directed.  BELOW ARE SYMPTOMS THAT SHOULD BE REPORTED IMMEDIATELY: *FEVER GREATER THAN 100.4 F (38 C) OR HIGHER *CHILLS OR SWEATING *NAUSEA AND VOMITING THAT IS NOT CONTROLLED WITH YOUR NAUSEA MEDICATION *UNUSUAL SHORTNESS OF BREATH *UNUSUAL BRUISING OR BLEEDING *URINARY PROBLEMS (pain or burning when urinating, or frequent urination) *BOWEL PROBLEMS (unusual diarrhea, constipation, pain near the anus) TENDERNESS IN MOUTH AND THROAT WITH OR WITHOUT PRESENCE OF ULCERS (sore throat, sores in mouth, or a toothache) UNUSUAL RASH, SWELLING OR PAIN  UNUSUAL VAGINAL DISCHARGE OR ITCHING   Items with * indicate a potential emergency and should be followed up as soon as possible or go to the Emergency Department if any problems should occur.  Please show the CHEMOTHERAPY ALERT CARD  or IMMUNOTHERAPY ALERT CARD at check-in to the Emergency Department and triage nurse.  Should you have questions after your visit or need to cancel or reschedule your appointment, please contact [facility] - A DEPT OF [facility]  Dept: [phone]  and follow the prompts.  Office hours are 8:00 a.m. to 4:30 p.m. Monday - Friday. Please note that voicemails left after 4:00 p.m. may not be returned until the following business day.  We are closed weekends and major holidays. You have access to a nurse at all times for urgent questions. Please call the main number to the clinic Dept: [phone] and follow the prompts.   For any non-urgent questions, you may also contact your provider using MyChart. We now offer e-Visits for anyone 57 and older to request care online for non-urgent symptoms. For details visit [email or web address].   Also download the MyChart app! Go to the app store, search MyChart, open the app, select [facility], and log in with your MyChart username and password.

## 2023-08-02 ENCOUNTER — Encounter: Payer: Self-pay | Admitting: *Deleted

## 2023-08-05 ENCOUNTER — Other Ambulatory Visit: Payer: Self-pay

## 2023-08-12 NOTE — Pre-Procedure Instructions (Signed)
 Surgical Instructions   Your procedure is scheduled on August 16, 2023. Report to [facility] Main Entrance A at 6:30 A.M., then check in with the Admitting office. Any questions or running late day of surgery: call [phone]  Questions prior to your surgery date: call [phone], Monday-Friday, 8am-4pm. If you experience any cold or flu symptoms such as cough, fever, chills, shortness of breath, etc. between now and your scheduled surgery, please notify us  at the above number.     Remember:  Do not eat after midnight the night before your surgery   You may drink clear liquids until 5:30 AM the morning of your surgery.   Clear liquids allowed are: Water, Non-Citrus Juices (without pulp), Carbonated Beverages, Clear Tea (no milk, honey, etc.), Black Coffee Only (NO MILK, CREAM OR POWDERED CREAMER of any kind), and Gatorade.    Take these medicines the morning of surgery with A SIP OF WATER: amLODipine  (NORVASC )  busPIRone  (BUSPAR )  DULoxetine  (CYMBALTA )  NIFEdipine (PROCARDIA-XL/NIFEDICAL-XL)    May take these medicines IF NEEDED: albuterol  (VENTOLIN  HFA) inhaler - please bring inhaler with you morning of surgery cyclobenzaprine (FLEXERIL)  LORazepam  (ATIVAN )    One week prior to surgery, STOP taking any Aspirin  (unless otherwise instructed by your surgeon) Aleve, Naproxen, Ibuprofen, Motrin, Advil, Goody's, BC's, all herbal medications, fish oil, and non-prescription vitamins.                     Do NOT Smoke (Tobacco/Vaping) for 24 hours prior to your procedure.  If you use a CPAP at night, you may bring your mask/headgear for your overnight stay.   You will be asked to remove any contacts, glasses, piercing's, hearing aid's, dentures/partials prior to surgery. Please bring cases for these items if needed.    Patients discharged the day of surgery will not be allowed to drive home, and someone needs to stay with them for 24 hours.  SURGICAL WAITING ROOM  VISITATION Patients may have no more than 2 support people in the waiting area - these visitors may rotate.   Pre-op nurse will coordinate an appropriate time for 1 ADULT support person, who may not rotate, to accompany patient in pre-op.  Children under the age of 76 must have an adult with them who is not the patient and must remain in the main waiting area with an adult.  If the patient needs to stay at the hospital during part of their recovery, the visitor guidelines for inpatient rooms apply.  Please refer to the [facility] website for the visitor guidelines for any additional information.   If you received a COVID test during your pre-op visit  it is requested that you wear a mask when out in public, stay away from anyone that may not be feeling well and notify your surgeon if you develop symptoms. If you have been in contact with anyone that has tested positive in the last 10 days please notify you surgeon.      Pre-operative CHG Bathing Instructions   You can play a key role in reducing the risk of infection after surgery. Your skin needs to be as free of germs as possible. You can reduce the number of germs on your skin by washing with CHG (chlorhexidine gluconate) soap before surgery. CHG is an antiseptic soap that kills germs and continues to kill germs even after washing.   DO NOT use if you have an allergy to chlorhexidine/CHG or antibacterial soaps. If your skin becomes reddened or  irritated, stop using the CHG and notify one of our RNs at [phone].              TAKE A SHOWER THE NIGHT BEFORE SURGERY AND THE DAY OF SURGERY    Please keep in mind the following:  DO NOT shave, including legs and underarms, 48 hours prior to surgery.   You may shave your face before/day of surgery.  Place clean sheets on your bed the night before surgery Use a clean washcloth (not used since being washed) for each shower. DO NOT sleep with pet's night before surgery.  CHG Shower  Instructions:  Wash your face and private area with normal soap. If you choose to wash your hair, wash first with your normal shampoo.  After you use shampoo/soap, rinse your hair and body thoroughly to remove shampoo/soap residue.  Turn the water OFF and apply half the bottle of CHG soap to a CLEAN washcloth.  Apply CHG soap ONLY FROM YOUR NECK DOWN TO YOUR TOES (washing for 3-5 minutes)  DO NOT use CHG soap on face, private areas, open wounds, or sores.  Pay special attention to the area where your surgery is being performed.  If you are having back surgery, having someone wash your back for you may be helpful. Wait 2 minutes after CHG soap is applied, then you may rinse off the CHG soap.  Pat dry with a clean towel  Put on clean pajamas    Additional instructions for the day of surgery: DO NOT APPLY any lotions, deodorants, cologne, or perfumes.   Do not wear jewelry or makeup Do not wear nail polish, gel polish, artificial nails, or any other type of covering on natural nails (fingers and toes) Do not bring valuables to the hospital. [facility] is not responsible for valuables/personal belongings. Put on clean/comfortable clothes.  Please brush your teeth.  Ask your nurse before applying any prescription medications to the skin.

## 2023-08-13 ENCOUNTER — Encounter (HOSPITAL_COMMUNITY): Payer: Self-pay

## 2023-08-13 ENCOUNTER — Inpatient Hospital Stay (HOSPITAL_COMMUNITY)
Admission: RE | Admit: 2023-08-13 | Discharge: 2023-08-13 | Disposition: A | Discharge status: Home / Self Care | Source: Ambulatory Visit | Attending: General Surgery

## 2023-08-13 ENCOUNTER — Other Ambulatory Visit: Payer: Self-pay

## 2023-08-13 ENCOUNTER — Other Ambulatory Visit: Payer: Self-pay | Admitting: General Surgery

## 2023-08-13 VITALS — BP 150/68 | HR 79 | Temp 97.7°F | Resp 17 | Ht 69.0 in | Wt 158.0 lb

## 2023-08-13 DIAGNOSIS — Z01810 Encounter for preprocedural cardiovascular examination: Secondary | ICD-10-CM | POA: Diagnosis not present

## 2023-08-13 DIAGNOSIS — R9431 Abnormal electrocardiogram [ECG] [EKG]: Secondary | ICD-10-CM | POA: Insufficient documentation

## 2023-08-13 DIAGNOSIS — I2510 Atherosclerotic heart disease of native coronary artery without angina pectoris: Secondary | ICD-10-CM | POA: Insufficient documentation

## 2023-08-13 DIAGNOSIS — Z01818 Encounter for other preprocedural examination: Secondary | ICD-10-CM | POA: Diagnosis present

## 2023-08-13 DIAGNOSIS — C50412 Malignant neoplasm of upper-outer quadrant of left female breast: Secondary | ICD-10-CM

## 2023-08-13 NOTE — Progress Notes (Signed)
 PCP - Annabella Rigg, NP at [facility] Cardiologist - Denies    PPM/ICD - Denies Device Orders - n/a Rep Notified - n/a  Chest x-ray - n/a EKG - Within the last month - tracing requested from PCP Stress Test - Denies ECHO - 05/17/2023 Cardiac Cath - Denies  Sleep Study - Denies CPAP - n/a  No DM  Last dose of GLP1 agonist- n/a GLP1 instructions: n/a  Blood Thinner Instructions: n/a Aspirin  Instructions: n/a  ERAS Protcol - Clear liquids until 0530 morning of surgery PRE-SURGERY Ensure or G2- n/a  COVID TEST- n/a   Anesthesia review: Yes. Breast seed placement 08/15/23. Recent echo completed (related to chemo). EKG tracing requested.   Patient denies shortness of breath, fever, cough and chest pain at PAT appointment. Pt denies any respiratory illness/infection in the last two months.   All instructions explained to the patient, with a verbal understanding of the material. Patient agrees to go over the instructions while at home for a better understanding. Patient also instructed to self quarantine after being tested for COVID-19. The opportunity to ask questions was provided.

## 2023-08-14 ENCOUNTER — Ambulatory Visit: Payer: Self-pay | Admitting: General Surgery

## 2023-08-14 DIAGNOSIS — C50412 Malignant neoplasm of upper-outer quadrant of left female breast: Secondary | ICD-10-CM

## 2023-08-14 DIAGNOSIS — C50811 Malignant neoplasm of overlapping sites of right female breast: Secondary | ICD-10-CM

## 2023-08-15 ENCOUNTER — Ambulatory Visit
Admission: RE | Admit: 2023-08-15 | Discharge: 2023-08-15 | Disposition: A | Discharge status: Home / Self Care | Source: Ambulatory Visit | Attending: General Surgery | Admitting: General Surgery

## 2023-08-15 ENCOUNTER — Inpatient Hospital Stay
Admission: RE | Admit: 2023-08-15 | Discharge: 2023-08-15 | Discharge status: Home / Self Care | Source: Ambulatory Visit | Attending: General Surgery | Admitting: General Surgery

## 2023-08-15 DIAGNOSIS — C50412 Malignant neoplasm of upper-outer quadrant of left female breast: Secondary | ICD-10-CM

## 2023-08-15 HISTORY — PX: BREAST BIOPSY: SHX20

## 2023-08-15 NOTE — Anesthesia Preprocedure Evaluation (Signed)
 Anesthesia Evaluation  Patient identified by MRN, date of birth, ID band Patient awake    Reviewed: Allergy & Precautions, H&P , NPO status , Patient's Chart, lab work & pertinent test results  Airway Mallampati: II  TM Distance: >3 FB Neck ROM: Full    Dental no notable dental hx. (+) Poor Dentition, Missing, Dental Advisory Given, Partial Lower   Pulmonary neg pulmonary ROS, Current Smoker and Patient abstained from smoking.   Pulmonary exam normal breath sounds clear to auscultation       Cardiovascular Exercise Tolerance: Good hypertension, Pt. on medications negative cardio ROS Normal cardiovascular exam Rhythm:Regular Rate:Normal  Echo 5/25 1. Left ventricular ejection fraction, by estimation, is 55 to 60%. The  left ventricle has normal function. The left ventricle has no regional  wall motion abnormalities. Left ventricular diastolic parameters were  normal.   2. Right ventricular systolic function is normal. The right ventricular  size is mildly enlarged.   3. The mitral valve is normal in structure. No evidence of mitral valve  regurgitation. No evidence of mitral stenosis.   4. The aortic valve is normal in structure. Aortic valve regurgitation is  not visualized. No aortic stenosis is present.   5. The inferior vena cava is normal in size with greater than 50%  respiratory variability, suggesting right atrial pressure of 3 mmHg.     Neuro/Psych  PSYCHIATRIC DISORDERS Anxiety Depression    Residual weakness s LEFT side since 2022 CVA, Residual Symptoms negative neurological ROS  negative psych ROS   GI/Hepatic negative GI ROS, Neg liver ROS,,,  Endo/Other  negative endocrine ROS    Renal/GU negative Renal ROS  negative genitourinary   Musculoskeletal negative musculoskeletal ROS (+)    Abdominal   Peds negative pediatric ROS (+)  Hematology negative hematology ROS (+)   Anesthesia Other  Findings   Reproductive/Obstetrics negative OB ROS                              Anesthesia Physical Anesthesia Plan  ASA: 3  Anesthesia Plan: General and Regional   Post-op Pain Management: Tylenol  PO (pre-op)*, Regional block* and Celebrex PO (pre-op)*   Induction: Intravenous  PONV Risk Score and Plan: 3 and Ondansetron , Dexamethasone  and Midazolam   Airway Management Planned: Oral ETT and LMA  Additional Equipment: None  Intra-op Plan:   Post-operative Plan: Extubation in OR  Informed Consent: I have reviewed the patients History and Physical, chart, labs and discussed the procedure including the risks, benefits and alternatives for the proposed anesthesia with the patient or authorized representative who has indicated his/her understanding and acceptance.     Dental advisory given  Plan Discussed with: Anesthesiologist and CRNA  Anesthesia Plan Comments: (Discussed both nerve block for pain relief post-op and GA; including NV, sore throat, dental injury, and pulmonary complications)         Anesthesia Quick Evaluation

## 2023-08-16 ENCOUNTER — Other Ambulatory Visit: Payer: Self-pay

## 2023-08-16 ENCOUNTER — Ambulatory Visit
Admit: 2023-08-16 | Discharge: 2023-08-16 | Disposition: A | Discharge status: Home / Self Care | Attending: General Surgery | Admitting: General Surgery

## 2023-08-16 ENCOUNTER — Inpatient Hospital Stay (HOSPITAL_COMMUNITY)
Admission: RE | Admit: 2023-08-16 | Discharge: 2023-08-25 | DRG: 579 | Disposition: A | Discharge status: Home / Self Care | Attending: General Surgery | Admitting: General Surgery

## 2023-08-16 ENCOUNTER — Inpatient Hospital Stay (HOSPITAL_COMMUNITY): Admitting: Certified Registered Nurse Anesthetist

## 2023-08-16 ENCOUNTER — Encounter (HOSPITAL_COMMUNITY): Payer: Self-pay | Admitting: General Surgery

## 2023-08-16 ENCOUNTER — Ambulatory Visit (HOSPITAL_COMMUNITY): Admitting: Certified Registered Nurse Anesthetist

## 2023-08-16 ENCOUNTER — Ambulatory Visit (HOSPITAL_COMMUNITY)

## 2023-08-16 ENCOUNTER — Encounter (HOSPITAL_COMMUNITY): Admission: RE | Payer: Self-pay | Source: Home / Self Care

## 2023-08-16 ENCOUNTER — Ambulatory Visit (HOSPITAL_COMMUNITY): Admission: RE | Admit: 2023-08-16 | Source: Home / Self Care | Admitting: General Surgery

## 2023-08-16 ENCOUNTER — Encounter (HOSPITAL_COMMUNITY)
Admission: RE | Disposition: A | Discharge status: Home / Self Care | Payer: Self-pay | Source: Home / Self Care | Attending: General Surgery

## 2023-08-16 DIAGNOSIS — D62 Acute posthemorrhagic anemia: Secondary | ICD-10-CM | POA: Diagnosis not present

## 2023-08-16 DIAGNOSIS — R7881 Bacteremia: Secondary | ICD-10-CM | POA: Diagnosis present

## 2023-08-16 DIAGNOSIS — F17210 Nicotine dependence, cigarettes, uncomplicated: Secondary | ICD-10-CM | POA: Diagnosis present

## 2023-08-16 DIAGNOSIS — Z9013 Acquired absence of bilateral breasts and nipples: Principal | ICD-10-CM

## 2023-08-16 DIAGNOSIS — Z808 Family history of malignant neoplasm of other organs or systems: Secondary | ICD-10-CM

## 2023-08-16 DIAGNOSIS — L7632 Postprocedural hematoma of skin and subcutaneous tissue following other procedure: Secondary | ICD-10-CM | POA: Diagnosis not present

## 2023-08-16 DIAGNOSIS — C50912 Malignant neoplasm of unspecified site of left female breast: Secondary | ICD-10-CM

## 2023-08-16 DIAGNOSIS — C50412 Malignant neoplasm of upper-outer quadrant of left female breast: Principal | ICD-10-CM | POA: Diagnosis present

## 2023-08-16 DIAGNOSIS — C50911 Malignant neoplasm of unspecified site of right female breast: Secondary | ICD-10-CM | POA: Diagnosis not present

## 2023-08-16 DIAGNOSIS — E872 Acidosis, unspecified: Secondary | ICD-10-CM | POA: Diagnosis not present

## 2023-08-16 DIAGNOSIS — Y848 Other medical procedures as the cause of abnormal reaction of the patient, or of later complication, without mention of misadventure at the time of the procedure: Secondary | ICD-10-CM | POA: Diagnosis not present

## 2023-08-16 DIAGNOSIS — Z800 Family history of malignant neoplasm of digestive organs: Secondary | ICD-10-CM

## 2023-08-16 DIAGNOSIS — Z833 Family history of diabetes mellitus: Secondary | ICD-10-CM

## 2023-08-16 DIAGNOSIS — Z801 Family history of malignant neoplasm of trachea, bronchus and lung: Secondary | ICD-10-CM

## 2023-08-16 DIAGNOSIS — I10 Essential (primary) hypertension: Secondary | ICD-10-CM | POA: Diagnosis not present

## 2023-08-16 DIAGNOSIS — Z7902 Long term (current) use of antithrombotics/antiplatelets: Secondary | ICD-10-CM

## 2023-08-16 DIAGNOSIS — C50411 Malignant neoplasm of upper-outer quadrant of right female breast: Secondary | ICD-10-CM | POA: Diagnosis present

## 2023-08-16 DIAGNOSIS — Z79899 Other long term (current) drug therapy: Secondary | ICD-10-CM

## 2023-08-16 DIAGNOSIS — F32A Depression, unspecified: Secondary | ICD-10-CM | POA: Diagnosis present

## 2023-08-16 DIAGNOSIS — Z171 Estrogen receptor negative status [ER-]: Secondary | ICD-10-CM

## 2023-08-16 DIAGNOSIS — Z7982 Long term (current) use of aspirin: Secondary | ICD-10-CM

## 2023-08-16 DIAGNOSIS — S20211A Contusion of right front wall of thorax, initial encounter: Secondary | ICD-10-CM | POA: Diagnosis not present

## 2023-08-16 DIAGNOSIS — Z803 Family history of malignant neoplasm of breast: Secondary | ICD-10-CM

## 2023-08-16 DIAGNOSIS — Z1732 Human epidermal growth factor receptor 2 negative status: Secondary | ICD-10-CM

## 2023-08-16 DIAGNOSIS — C50811 Malignant neoplasm of overlapping sites of right female breast: Secondary | ICD-10-CM

## 2023-08-16 DIAGNOSIS — D72829 Elevated white blood cell count, unspecified: Secondary | ICD-10-CM | POA: Diagnosis not present

## 2023-08-16 DIAGNOSIS — F419 Anxiety disorder, unspecified: Secondary | ICD-10-CM | POA: Diagnosis present

## 2023-08-16 DIAGNOSIS — Z83719 Family history of colon polyps, unspecified: Secondary | ICD-10-CM

## 2023-08-16 DIAGNOSIS — Z8249 Family history of ischemic heart disease and other diseases of the circulatory system: Secondary | ICD-10-CM

## 2023-08-16 DIAGNOSIS — E785 Hyperlipidemia, unspecified: Secondary | ICD-10-CM | POA: Diagnosis present

## 2023-08-16 DIAGNOSIS — Z1731 Human epidermal growth factor receptor 2 positive status: Secondary | ICD-10-CM

## 2023-08-16 DIAGNOSIS — Z8041 Family history of malignant neoplasm of ovary: Secondary | ICD-10-CM

## 2023-08-16 DIAGNOSIS — Z1722 Progesterone receptor negative status: Secondary | ICD-10-CM

## 2023-08-16 DIAGNOSIS — Z170 Estrogen receptor positive status [ER+]: Secondary | ICD-10-CM

## 2023-08-16 DIAGNOSIS — I69354 Hemiplegia and hemiparesis following cerebral infarction affecting left non-dominant side: Secondary | ICD-10-CM

## 2023-08-16 DIAGNOSIS — R578 Other shock: Secondary | ICD-10-CM | POA: Diagnosis not present

## 2023-08-16 HISTORY — PX: MASTECTOMY W/ SENTINEL NODE BIOPSY: SHX2001

## 2023-08-16 HISTORY — PX: AXILLARY LYMPH NODE DISSECTION: SHX5229

## 2023-08-16 SURGERY — BREAST LUMPECTOMY WITH RADIOACTIVE SEED LOCALIZATION
Anesthesia: General | Site: Breast | Laterality: Left

## 2023-08-16 SURGERY — MASTECTOMY WITH SENTINEL LYMPH NODE BIOPSY
Anesthesia: Regional | Laterality: Left

## 2023-08-16 MED ORDER — DEXAMETHASONE SODIUM PHOSPHATE 10 MG/ML IJ SOLN
INTRAMUSCULAR | Status: DC | PRN
  Administered 2023-08-16: 4 mg via INTRAVENOUS

## 2023-08-16 MED ORDER — CHLORHEXIDINE GLUCONATE 0.12 % MT SOLN
15.0000 mL | Freq: Once | OROMUCOSAL | Status: AC
  Administered 2023-08-16: 15 mL via OROMUCOSAL
  Filled 2023-08-16: qty 15

## 2023-08-16 MED ORDER — MORPHINE SULFATE (PF) 2 MG/ML IV SOLN
1.0000 mg | INTRAVENOUS | Status: DC | PRN
  Administered 2023-08-21 (×3): 1 mg via INTRAVENOUS
  Administered 2023-08-22 (×2): 2 mg via INTRAVENOUS
  Administered 2023-08-22: 1 mg via INTRAVENOUS
  Administered 2023-08-22: 2 mg via INTRAVENOUS
  Administered 2023-08-23: 1 mg via INTRAVENOUS
  Administered 2023-08-23 – 2023-08-25 (×3): 2 mg via INTRAVENOUS
  Filled 2023-08-16 (×11): qty 1

## 2023-08-16 MED ORDER — ORAL CARE MOUTH RINSE: 15.0000 mL | Freq: Once | OROMUCOSAL | Status: AC

## 2023-08-16 MED ORDER — AMLODIPINE BESYLATE 10 MG PO TABS
10.0000 mg | ORAL_TABLET | Freq: Every day | ORAL | Status: DC
  Administered 2023-08-17 – 2023-08-21 (×5): 10 mg via ORAL
  Filled 2023-08-16 (×5): qty 1

## 2023-08-16 MED ORDER — LACTATED RINGERS IV SOLN: INTRAVENOUS | Status: DC | PRN

## 2023-08-16 MED ORDER — BUSPIRONE HCL 5 MG PO TABS
5.0000 mg | ORAL_TABLET | Freq: Two times a day (BID) | ORAL | Status: DC
  Administered 2023-08-16 – 2023-08-25 (×18): 5 mg via ORAL
  Filled 2023-08-16 (×18): qty 1

## 2023-08-16 MED ORDER — MONTELUKAST SODIUM 10 MG PO TABS
10.0000 mg | ORAL_TABLET | Freq: Every day | ORAL | Status: DC
  Administered 2023-08-17 – 2023-08-24 (×8): 10 mg via ORAL
  Filled 2023-08-16 (×8): qty 1

## 2023-08-16 MED ORDER — DULOXETINE HCL 60 MG PO CPEP
60.0000 mg | ORAL_CAPSULE | Freq: Every day | ORAL | Status: DC
  Administered 2023-08-16 – 2023-08-25 (×10): 60 mg via ORAL
  Filled 2023-08-16 (×10): qty 1

## 2023-08-16 MED ORDER — CEFAZOLIN SODIUM-DEXTROSE 2-4 GM/100ML-% IV SOLN
2.0000 g | INTRAVENOUS | Status: AC
  Administered 2023-08-16: 2 g via INTRAVENOUS
  Filled 2023-08-16: qty 100

## 2023-08-16 MED ORDER — PANTOPRAZOLE SODIUM 40 MG IV SOLR
40.0000 mg | Freq: Every day | INTRAVENOUS | Status: DC
  Administered 2023-08-16 – 2023-08-17 (×2): 40 mg via INTRAVENOUS
  Filled 2023-08-16 (×2): qty 10

## 2023-08-16 MED ORDER — ACETAMINOPHEN 500 MG PO TABS
1000.0000 mg | ORAL_TABLET | Freq: Once | ORAL | Status: AC
  Administered 2023-08-16: 1000 mg via ORAL
  Filled 2023-08-16: qty 2

## 2023-08-16 MED ORDER — HYDROMORPHONE HCL 1 MG/ML IJ SOLN
INTRAMUSCULAR | Status: DC | PRN
  Administered 2023-08-16 (×2): .5 mg via INTRAVENOUS

## 2023-08-16 MED ORDER — BUPIVACAINE-EPINEPHRINE 0.25% -1:200000 IJ SOLN: INTRAMUSCULAR | Status: DC | PRN

## 2023-08-16 MED ORDER — TECHNETIUM TC 99M TILMANOCEPT KIT
2.0000 | PACK | Freq: Once | INTRAVENOUS | Status: AC
  Administered 2023-08-16: 2 via INTRADERMAL

## 2023-08-16 MED ORDER — MEPERIDINE HCL 25 MG/ML IJ SOLN: 6.2500 mg | INTRAMUSCULAR | Status: DC | PRN

## 2023-08-16 MED ORDER — SODIUM CHLORIDE 0.9 % IV SOLN: INTRAVENOUS | Status: AC

## 2023-08-16 MED ORDER — 0.9 % SODIUM CHLORIDE (POUR BTL) OPTIME
TOPICAL | Status: DC | PRN
  Administered 2023-08-16: 1000 mL

## 2023-08-16 MED ORDER — ONDANSETRON 4 MG PO TBDP: 4.0000 mg | ORAL_TABLET | Freq: Four times a day (QID) | ORAL | Status: DC | PRN

## 2023-08-16 MED ORDER — GABAPENTIN 100 MG PO CAPS
100.0000 mg | ORAL_CAPSULE | Freq: Two times a day (BID) | ORAL | 2 refills | Status: AC | PRN
Start: 2023-08-16 — End: 2024-08-15

## 2023-08-16 MED ORDER — LOSARTAN POTASSIUM 50 MG PO TABS
100.0000 mg | ORAL_TABLET | Freq: Every day | ORAL | Status: DC
  Administered 2023-08-16 – 2023-08-21 (×6): 100 mg via ORAL
  Filled 2023-08-16 (×6): qty 2

## 2023-08-16 MED ORDER — SUGAMMADEX SODIUM 200 MG/2ML IV SOLN
INTRAVENOUS | Status: DC | PRN
  Administered 2023-08-16: 150 mg via INTRAVENOUS

## 2023-08-16 MED ORDER — PHENYLEPHRINE 80 MCG/ML (10ML) SYRINGE FOR IV PUSH (FOR BLOOD PRESSURE SUPPORT)
PREFILLED_SYRINGE | INTRAVENOUS | Status: DC | PRN
  Administered 2023-08-16: 80 ug via INTRAVENOUS
  Administered 2023-08-16: 160 ug via INTRAVENOUS
  Administered 2023-08-16 (×2): 80 ug via INTRAVENOUS

## 2023-08-16 MED ORDER — CHLORHEXIDINE GLUCONATE CLOTH 2 % EX PADS: 6.0000 | MEDICATED_PAD | Freq: Once | CUTANEOUS | Status: DC

## 2023-08-16 MED ORDER — PROPOFOL 10 MG/ML IV BOLUS
INTRAVENOUS | Status: AC
  Filled 2023-08-16: qty 20

## 2023-08-16 MED ORDER — MIDAZOLAM HCL 2 MG/2ML IJ SOLN
INTRAMUSCULAR | Status: DC | PRN
  Administered 2023-08-16: .5 mg via INTRAVENOUS
  Administered 2023-08-16: 1 mg via INTRAVENOUS
  Administered 2023-08-16: .5 mg via INTRAVENOUS

## 2023-08-16 MED ORDER — ENOXAPARIN SODIUM 30 MG/0.3ML IJ SOSY
30.0000 mg | PREFILLED_SYRINGE | INTRAMUSCULAR | Status: DC
  Administered 2023-08-17 – 2023-08-21 (×5): 30 mg via SUBCUTANEOUS
  Filled 2023-08-16 (×5): qty 0.3

## 2023-08-16 MED ORDER — OXYCODONE HCL 5 MG PO TABS: 5.0000 mg | ORAL_TABLET | Freq: Four times a day (QID) | ORAL | 0 refills | Status: DC | PRN

## 2023-08-16 MED ORDER — ONDANSETRON HCL 4 MG/2ML IJ SOLN: 4.0000 mg | Freq: Once | INTRAMUSCULAR | Status: DC | PRN

## 2023-08-16 MED ORDER — OXYCODONE HCL 5 MG PO TABS
5.0000 mg | ORAL_TABLET | ORAL | Status: DC | PRN
  Administered 2023-08-16: 5 mg via ORAL
  Administered 2023-08-16 – 2023-08-25 (×24): 10 mg via ORAL
  Filled 2023-08-16: qty 2
  Filled 2023-08-16: qty 1
  Filled 2023-08-16 (×25): qty 2

## 2023-08-16 MED ORDER — BUPIVACAINE-EPINEPHRINE (PF) 0.25% -1:200000 IJ SOLN
INTRAMUSCULAR | Status: AC
Start: 2023-08-16 — End: 2023-08-16
  Filled 2023-08-16: qty 30

## 2023-08-16 MED ORDER — ORAL CARE MOUTH RINSE: 15.0000 mL | OROMUCOSAL | Status: DC | PRN

## 2023-08-16 MED ORDER — TRAZODONE HCL 100 MG PO TABS
100.0000 mg | ORAL_TABLET | Freq: Every evening | ORAL | Status: DC | PRN
  Administered 2023-08-16: 100 mg via ORAL
  Filled 2023-08-16: qty 1

## 2023-08-16 MED ORDER — HYDROMORPHONE HCL 1 MG/ML IJ SOLN
INTRAMUSCULAR | Status: AC
  Filled 2023-08-16: qty 1

## 2023-08-16 MED ORDER — PROPOFOL 10 MG/ML IV BOLUS
INTRAVENOUS | Status: DC | PRN
  Administered 2023-08-16: 100 mg via INTRAVENOUS

## 2023-08-16 MED ORDER — GABAPENTIN 100 MG PO CAPS
100.0000 mg | ORAL_CAPSULE | ORAL | Status: AC
  Administered 2023-08-16: 100 mg via ORAL
  Filled 2023-08-16: qty 1

## 2023-08-16 MED ORDER — FENTANYL CITRATE (PF) 250 MCG/5ML IJ SOLN
INTRAMUSCULAR | Status: AC
  Filled 2023-08-16: qty 5

## 2023-08-16 MED ORDER — ROCURONIUM BROMIDE 10 MG/ML (PF) SYRINGE
PREFILLED_SYRINGE | INTRAVENOUS | Status: DC | PRN
  Administered 2023-08-16: 60 mg via INTRAVENOUS
  Administered 2023-08-16: 30 mg via INTRAVENOUS

## 2023-08-16 MED ORDER — CELECOXIB 200 MG PO CAPS
200.0000 mg | ORAL_CAPSULE | Freq: Once | ORAL | Status: AC
  Administered 2023-08-16: 200 mg via ORAL
  Filled 2023-08-16: qty 1

## 2023-08-16 MED ORDER — MIDAZOLAM HCL 2 MG/2ML IJ SOLN
INTRAMUSCULAR | Status: AC
Start: 2023-08-16 — End: 2023-08-16
  Filled 2023-08-16: qty 2

## 2023-08-16 MED ORDER — FENTANYL CITRATE (PF) 100 MCG/2ML IJ SOLN: 25.0000 ug | INTRAMUSCULAR | Status: DC | PRN

## 2023-08-16 MED ORDER — NIFEDIPINE ER OSMOTIC RELEASE 30 MG PO TB24
30.0000 mg | ORAL_TABLET | Freq: Every day | ORAL | Status: DC
  Administered 2023-08-16 – 2023-08-21 (×6): 30 mg via ORAL
  Filled 2023-08-16 (×6): qty 1

## 2023-08-16 MED ORDER — PHENYLEPHRINE HCL-NACL 20-0.9 MG/250ML-% IV SOLN
INTRAVENOUS | Status: DC | PRN
  Administered 2023-08-16: 30 ug/min via INTRAVENOUS

## 2023-08-16 MED ORDER — METHOCARBAMOL 500 MG PO TABS
500.0000 mg | ORAL_TABLET | Freq: Four times a day (QID) | ORAL | Status: DC | PRN
  Administered 2023-08-16 – 2023-08-17 (×2): 500 mg via ORAL
  Filled 2023-08-16 (×2): qty 1

## 2023-08-16 MED ORDER — ONDANSETRON HCL 4 MG/2ML IJ SOLN
4.0000 mg | Freq: Four times a day (QID) | INTRAMUSCULAR | Status: DC | PRN
  Administered 2023-08-21 (×2): 4 mg via INTRAVENOUS
  Filled 2023-08-16 (×2): qty 2

## 2023-08-16 MED ORDER — OXYCODONE HCL 5 MG PO TABS: 5.0000 mg | ORAL_TABLET | Freq: Once | ORAL | Status: DC | PRN

## 2023-08-16 MED ORDER — ALBUTEROL SULFATE (2.5 MG/3ML) 0.083% IN NEBU: 2.5000 mg | INHALATION_SOLUTION | RESPIRATORY_TRACT | Status: DC | PRN

## 2023-08-16 MED ORDER — OXYCODONE HCL 5 MG/5ML PO SOLN: 5.0000 mg | Freq: Once | ORAL | Status: DC | PRN

## 2023-08-16 MED ORDER — ONDANSETRON HCL 4 MG/2ML IJ SOLN
INTRAMUSCULAR | Status: DC | PRN
  Administered 2023-08-16: 4 mg via INTRAVENOUS

## 2023-08-16 MED ORDER — FENTANYL CITRATE (PF) 250 MCG/5ML IJ SOLN
INTRAMUSCULAR | Status: DC | PRN
  Administered 2023-08-16 (×5): 50 ug via INTRAVENOUS

## 2023-08-16 MED ORDER — LACTATED RINGERS IV SOLN: INTRAVENOUS | Status: DC

## 2023-08-16 MED ORDER — CLOPIDOGREL BISULFATE 75 MG PO TABS
75.0000 mg | ORAL_TABLET | Freq: Every day | ORAL | Status: DC
  Administered 2023-08-17 – 2023-08-21 (×5): 75 mg via ORAL
  Filled 2023-08-16 (×5): qty 1

## 2023-08-16 MED ORDER — METHOCARBAMOL 500 MG PO TABS: 500.0000 mg | ORAL_TABLET | Freq: Four times a day (QID) | ORAL | 2 refills | Status: DC | PRN

## 2023-08-16 SURGICAL SUPPLY — 51 items
BAG COUNTER SPONGE SURGICOUNT (BAG) ×2 IMPLANT
BINDER BREAST LRG (GAUZE/BANDAGES/DRESSINGS) IMPLANT
BINDER BREAST XLRG (GAUZE/BANDAGES/DRESSINGS) IMPLANT
BIOPATCH RED 1 DISK 7.0 (GAUZE/BANDAGES/DRESSINGS) ×2 IMPLANT
CANISTER SUCTION 3000ML PPV (SUCTIONS) ×2 IMPLANT
CHLORAPREP W/TINT 26 (MISCELLANEOUS) ×2 IMPLANT
CLIP APPLIE 9.375 MED OPEN (MISCELLANEOUS) ×2 IMPLANT
CNTNR URN SCR LID CUP LEK RST (MISCELLANEOUS) ×2 IMPLANT
COVER PROBE W GEL 5X96 (DRAPES) ×2 IMPLANT
COVER SURGICAL LIGHT HANDLE (MISCELLANEOUS) ×2 IMPLANT
DERMABOND ADVANCED .7 DNX12 (GAUZE/BANDAGES/DRESSINGS) ×2 IMPLANT
DEVICE DSSCT PLSMBLD 3.0S LGHT (MISCELLANEOUS) ×2 IMPLANT
DRAIN CHANNEL 19F RND (DRAIN) ×2 IMPLANT
DRAPE CHEST BREAST 15X10 FENES (DRAPES) ×2 IMPLANT
DRSG TEGADERM 4X4.75 (GAUZE/BANDAGES/DRESSINGS) ×2 IMPLANT
ELECT CAUTERY BLADE 6.4 (BLADE) ×2 IMPLANT
ELECT COATED BLADE 2.86 ST (ELECTRODE) ×2 IMPLANT
ELECTRODE REM PT RTRN 9FT ADLT (ELECTROSURGICAL) ×2 IMPLANT
EVACUATOR SILICONE 100CC (DRAIN) ×2 IMPLANT
FILTER IN LINE W/DETACHED HOSE (FILTER) ×2 IMPLANT
GAUZE PAD ABD 8X10 STRL (GAUZE/BANDAGES/DRESSINGS) ×2 IMPLANT
GAUZE SPONGE 4X4 12PLY STRL (GAUZE/BANDAGES/DRESSINGS) ×2 IMPLANT
GLOVE BIO SURGEON STRL SZ7.5 (GLOVE) ×2 IMPLANT
GOWN STRL REUS W/ TWL LRG LVL3 (GOWN DISPOSABLE) ×4 IMPLANT
KIT BASIN OR (CUSTOM PROCEDURE TRAY) ×2 IMPLANT
KIT TURNOVER KIT B (KITS) ×2 IMPLANT
LIGHT WAVEGUIDE WIDE FLAT (MISCELLANEOUS) IMPLANT
NDL 18GX1X1/2 (RX/OR ONLY) (NEEDLE) IMPLANT
NDL FILTER BLUNT 18X1 1/2 (NEEDLE) IMPLANT
NDL HYPO 25GX1X1/2 BEV (NEEDLE) ×2 IMPLANT
NEEDLE 18GX1X1/2 (RX/OR ONLY) (NEEDLE) IMPLANT
NEEDLE FILTER BLUNT 18X1 1/2 (NEEDLE) IMPLANT
NEEDLE HYPO 25GX1X1/2 BEV (NEEDLE) ×2 IMPLANT
NS IRRIG 1000ML POUR BTL (IV SOLUTION) ×2 IMPLANT
PACK GENERAL/GYN (CUSTOM PROCEDURE TRAY) ×2 IMPLANT
PAD ABD 8X10 STRL (GAUZE/BANDAGES/DRESSINGS) IMPLANT
PAD ARMBOARD POSITIONER FOAM (MISCELLANEOUS) ×2 IMPLANT
PENCIL SMOKE EVACUATOR (MISCELLANEOUS) ×2 IMPLANT
SPECIMEN JAR MEDIUM (MISCELLANEOUS) ×2 IMPLANT
SPECIMEN JAR X LARGE (MISCELLANEOUS) ×2 IMPLANT
SUT ETHILON 3 0 FSL (SUTURE) ×2 IMPLANT
SUT MNCRL AB 4-0 PS2 18 (SUTURE) ×2 IMPLANT
SUT MON AB 4-0 PC3 18 (SUTURE) ×2 IMPLANT
SUT VIC AB 0 CT1 27XBRD ANBCTR (SUTURE) ×4 IMPLANT
SUT VIC AB 2-0 SH 27XBRD (SUTURE) ×2 IMPLANT
SUT VIC AB 3-0 54X BRD REEL (SUTURE) IMPLANT
SUT VIC AB 3-0 SH 18 (SUTURE) ×2 IMPLANT
SYR CONTROL 10ML LL (SYRINGE) ×2 IMPLANT
TOWEL GREEN STERILE (TOWEL DISPOSABLE) ×2 IMPLANT
TOWEL GREEN STERILE FF (TOWEL DISPOSABLE) ×2 IMPLANT
TUBE CONNECTING 12X1/4 (SUCTIONS) IMPLANT

## 2023-08-16 NOTE — Anesthesia Postprocedure Evaluation (Signed)
 Anesthesia Post Note  Patient: Donna [name]  Procedure(s) Performed: MASTECTOMY WITH SENTINEL LYMPH NODE BIOPSY (Bilateral) LYMPHADENECTOMY, AXILLARY (Left)     Patient location during evaluation: PACU Anesthesia Type: Regional and General Level of consciousness: awake and alert Pain management: pain level controlled Vital Signs Assessment: post-procedure vital signs reviewed and stable Respiratory status: spontaneous breathing, nonlabored ventilation, respiratory function stable and patient connected to nasal cannula oxygen Cardiovascular status: blood pressure returned to baseline and stable Postop Assessment: no apparent nausea or vomiting Anesthetic complications: no   No notable events documented.  Last Vitals:  Vitals:   08/16/23 1130 08/16/23 1145  BP: 126/61 (!) 117/59  Pulse: 68 70  Resp: 14 12  Temp:    SpO2: 93% 93%    Last Pain:  Vitals:   08/16/23 1130  PainSc: 0-No pain                 Cletis Clack

## 2023-08-16 NOTE — Transfer of Care (Signed)
 Immediate Anesthesia Transfer of Care Note  Patient: Donna [name]  Procedure(s) Performed: MASTECTOMY WITH SENTINEL LYMPH NODE BIOPSY (Bilateral) LYMPHADENECTOMY, AXILLARY (Left)  Patient Location: PACU  Anesthesia Type:General  Level of Consciousness: awake, alert , and oriented  Airway & Oxygen Therapy: Patient Spontanous Breathing and Patient connected to nasal cannula oxygen  Post-op Assessment: Report given to RN and Post -op Vital signs reviewed and stable  Post vital signs: Reviewed and stable  Last Vitals:  Vitals Value Taken Time  BP 145/77 08/16/23 10:55  Temp 36.4 C 08/16/23 10:55  Pulse 73 08/16/23 11:00  Resp 14 08/16/23 11:00  SpO2 89 % 08/16/23 11:00  Vitals shown include unfiled device data.  Last Pain:  Vitals:   08/16/23 0729  PainSc: 0-No pain      Patients Stated Pain Goal: 0 (08/16/23 0707)  Complications: No notable events documented.

## 2023-08-16 NOTE — Anesthesia Postprocedure Evaluation (Signed)
 Anesthesia Post Note  Patient: Donna [name]  Procedure(s) Performed: MASTECTOMY WITH SENTINEL LYMPH NODE BIOPSY (Bilateral) LYMPHADENECTOMY, AXILLARY (Left)     Patient location during evaluation: PACU Anesthesia Type: Regional and General Level of consciousness: awake and alert Pain management: pain level controlled Vital Signs Assessment: post-procedure vital signs reviewed and stable Respiratory status: spontaneous breathing, nonlabored ventilation, respiratory function stable and patient connected to nasal cannula oxygen Cardiovascular status: blood pressure returned to baseline and stable Postop Assessment: no apparent nausea or vomiting Anesthetic complications: no   No notable events documented.  Last Vitals:  Vitals:   08/16/23 1145 08/16/23 1200  BP: (!) 117/59 (!) 115/59  Pulse: 70 70  Resp: 12 17  Temp:    SpO2: 93% 94%    Last Pain:  Vitals:   08/16/23 1200  PainSc: Asleep                 Lukis Bunt

## 2023-08-16 NOTE — Plan of Care (Signed)

## 2023-08-16 NOTE — H&P (Signed)
 REFERRING PHYSICIAN: Gudena, Vinay K, MD PROVIDER: DEWARD GARNETTE NULL, MD MRN: [id number] DOB: July 12, 1950 Subjective   Chief Complaint: RE-CHECK  History of Present Illness: Donna [name] is a 73 y.o. female who is seen today as an office consultation for evaluation of RE-CHECK  We are asked to see the patient in consultation by Dr. Odean to evaluate her for a new breast cancer. The patient is a 73 year old black female who recently noticed masses in both breast. She recently had a stroke and still has some residual left-sided weakness from that. She was evaluated with mammogram and ultrasound. She was found to have 2 masses in the right breast upper outer quadrant measuring 2.7 cm and 0.8 cm. She had 3 biopsies on the right that were all positive. The axilla looked normal. 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 On the left side she was found to have a 3.2 cm cancer involving the skin with a positive axillary lymph node. There was a second mass in the left of 9 mm that was biopsied and benign. She also had 3 areas medial to the cancer that were biopsied and came back as complex sclerosing lesions that were recommended to be excised. 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 She is trying to quit smoking. She does have a history of hypertension and recent stroke. She has a history of breast cancer and a sister. She received some neoadjuvant chemotherapy but could not tolerate it. She is now ready to have her definitive surgery. The right side responded some and now measures 1.2 cm. The left side responded minimally  Review of Systems: A complete review of systems was obtained from the patient. I have reviewed this information and discussed as appropriate with the patient. See HPI as well for other  ROS.  ROS   Medical History: Past Medical History:  Diagnosis Date  History of stroke  Hypertension   Patient Active Problem List  Diagnosis  Malignant neoplasm of upper-outer quadrant of left breast in female, estrogen receptor positive (CMS/HHS-HCC)  Malignant neoplasm of upper-outer quadrant of right breast in female, estrogen receptor negative (CMS/HHS-HCC)   Past Surgical History:  Procedure Laterality Date  LAPAROSCOPIC TUBAL LIGATION    No Known Allergies  Current Outpatient Medications on File Prior to Visit  Medication Sig Dispense Refill  amLODIPine  (NORVASC ) 10 MG tablet Take 1 tablet by mouth once daily  aspirin  81 MG EC tablet Take 81 mg by mouth once daily  busPIRone  (BUSPAR ) 5 MG tablet Take 1 tablet by mouth 2 (two) times daily  DULoxetine  (CYMBALTA ) 60 MG DR capsule Take 1 capsule by mouth once daily  fluticasone  propionate (FLONASE ) 50 mcg/actuation nasal spray Place 1 spray into one nostril  montelukast (SINGULAIR) 10 mg tablet Take 1 tablet by mouth once daily  rosuvastatin  (CRESTOR ) 20 MG tablet Take 1 tablet by mouth once daily  traZODone  (DESYREL ) 50 MG tablet  clopidogreL  (PLAVIX ) 75 mg tablet Take 75 mg by mouth once daily (Patient not taking: Reported on 05/30/2023)   No current facility-administered medications on file prior to visit.   Family History  Problem Relation Age of Onset  Colon cancer Mother  Diabetes Father  High blood pressure (Hypertension) Sister  Diabetes Sister  Breast cancer Sister  Skin cancer  Son    Social History   Tobacco Use  Smoking Status Every Day  Types: Cigarettes  Smokeless Tobacco Never    Social History   Socioeconomic History  Marital status: Divorced  Tobacco Use  Smoking status: Every Day  Types: Cigarettes  Smokeless tobacco: Never  Substance and Sexual Activity  Alcohol use: Yes  Drug use: Never   Social Drivers of Corporate investment banker Strain: Not at Risk (04/04/2022)  Received  from [employer]  Financial Resource Strain: 1  Food Insecurity: Not at Risk (04/04/2022)  Received from [employer] Insecurity  Food: 1  Transportation Needs: Not at Risk (04/04/2022)  Received from [employer] [facility] Needs  Transportation: 1  Physical Activity: Not on File (05/04/2021)  Received from [facility]  Physical Activity  Physical Activity: 0  Stress: Not on File (05/04/2021)  Received from [facility]  Stress  Stress: 0  Social Connections: Not on File (09/24/2022)  Received from [employer] [facility]  Social Connections  Connectedness: 0  Housing Stability: Unknown (05/30/2023)  Housing Stability Vital Sign  Homeless in the Last Year: No   Objective:   Vitals:  Weight: 72.7 kg (160 lb 3.2 oz)  PainSc: 0-No pain   There is no height or weight on file to calculate BMI.  Physical Exam Vitals reviewed.  Constitutional:  General: She is not in acute distress. Appearance: Normal appearance.  HENT:  Head: Normocephalic and atraumatic.  Right Ear: External ear normal.  Left Ear: External ear normal.  Nose: Nose normal.  Mouth/Throat:  Mouth: Mucous membranes are moist.  Pharynx: Oropharynx is clear.  Eyes:  General: No scleral icterus. Extraocular Movements: Extraocular movements intact.  Conjunctiva/sclera: Conjunctivae normal.  Pupils: Pupils are equal, round, and reactive to light.  Cardiovascular:  Rate and Rhythm: Normal rate and regular rhythm.  Pulses: Normal pulses.  Heart sounds: Normal heart sounds.  Pulmonary:  Effort: Pulmonary effort is normal. No respiratory distress.  Breath sounds: Normal breath sounds.  Abdominal:  General: Bowel sounds are normal.  Palpations: Abdomen is soft.  Tenderness: There is no abdominal tenderness.  Musculoskeletal:  General: No swelling or tenderness. Normal range of motion.  Cervical back: Normal range of motion and neck supple.  Comments: There is weakness on the left side with some early contracture of the  arm  Skin: General: Skin is warm and dry.  Coloration: Skin is not jaundiced.  Neurological:  General: No focal deficit present.  Mental Status: She is alert and oriented to person, place, and time.  Psychiatric:  Mood and Affect: Mood normal.  Behavior: Behavior normal.     Breast: There is a 3 to 4 cm mass in the upper portion of the right breast. There is a 3 to 4 cm mass in the lateral aspect of the left breast that is involving the skin. There is an enlarged lymph node in the left axilla. There is no palpable adenopathy in the right axilla.  Labs, Imaging and Diagnostic Testing:  Assessment and Plan:   There are no diagnoses linked to this encounter.  The patient appears to have a locally advanced cancer in both breasts. Given the size and constellation of the markers we feel she would benefit from neoadjuvant therapy. She has completed what she can tolerate of neoadjuvant therapy. She is now ready to schedule her definitive surgery. It looks as though she is no longer taking Plavix  but is just on an aspirin . I have talked her about the possibility  of mastectomy but she refuses. In order to try to successfully complete breast conservation she will need the right sided cancer bracketed and a sentinel node. She will need multiple seeds on the left side to localize the cancer in the complex sclerosing lesions as well as to target the single positive lymph node and she will also need a sentinel node on the left. I have discussed with her in detail the risks and benefits of the operation to do all this as well as some of the technical aspects including the possibility of having positive margins and the need for a mastectomy down the road and the change in the appearance of her breast on both sides and she understands and wishes to proceed. We will move forward with surgical scheduling.  Radiology reviewed her imaging and felt that there were too many areas to try to localize.  They recommended  mastectomy which is what I had recommended also initially.  We spoke to the patient and she the understands and agrees.  Plan for right mastectomy and sentinel node biopsy as well as left mastectomy with sentinel node biopsy and targeted lymph node dissection.

## 2023-08-16 NOTE — Anesthesia Postprocedure Evaluation (Signed)
 Anesthesia Post Note  Patient: Renatta Shrieves  Procedure(s) Performed: MASTECTOMY WITH SENTINEL LYMPH NODE BIOPSY (Bilateral) LYMPHADENECTOMY, AXILLARY (Left)     Patient location during evaluation: PACU Anesthesia Type: Regional and General Level of consciousness: awake and alert Pain management: pain level controlled Vital Signs Assessment: post-procedure vital signs reviewed and stable Respiratory status: spontaneous breathing, nonlabored ventilation, respiratory function stable and patient connected to nasal cannula oxygen Cardiovascular status: blood pressure returned to baseline and stable Postop Assessment: no apparent nausea or vomiting Anesthetic complications: no   No notable events documented.  Last Vitals:  Vitals:   08/16/23 1130 08/16/23 1145  BP: 126/61 (!) 117/59  Pulse: 68 70  Resp: 14 12  Temp:    SpO2: 93% 93%    Last Pain:  Vitals:   08/16/23 1130  PainSc: 0-No pain                 Cletis Clack

## 2023-08-16 NOTE — Op Note (Signed)
 08/16/2023  10:44 AM  PATIENT:  Donna [name]  73 y.o. female  PRE-OPERATIVE DIAGNOSIS:  BILATERAL BREAST CANCER  POST-OPERATIVE DIAGNOSIS:  BILATERAL BREAST CANCER  PROCEDURE:  Procedure(s) with comments: LEFT MASTECTOMY WITH DEEP LEFT AXILLARY SENTINEL LYMPH NODE BIOPSY AND TARGETED NODE DISSECTION RIGHT MASTECTOMY WITH DEEP RIGHT AXILLARY SENTINEL LYMPH NODE BIOPSY  SURGEON:  Surgeons and Role:    * Curvin Deward MOULD, MD - Primary  PHYSICIAN ASSISTANT:   ASSISTANTS: Powell Seats, RNFA   ANESTHESIA:   general  EBL:  minimal   BLOOD ADMINISTERED:none  DRAINS: (2) Blake drain(s) in the prepectoral space   LOCAL MEDICATIONS USED:  NONE  SPECIMEN:  Source of Specimen:  left mastectomy with sentinel node and targeted node, right mastectomy with sentinel nodes x 3  DISPOSITION OF SPECIMEN:  PATHOLOGY  COUNTS:  YES  TOURNIQUET:  * No tourniquets in log *  DICTATION: .Dragon Dictation  After informed consent was obtained the patient was brought to the operating room and placed in the supine position on the operating table.  After adequate induction of general anesthesia the patient's bilateral chest, breast, and axillary areas were prepped with ChloraPrep, allowed to dry, and draped in usual sterile manner.  An appropriate timeout was performed.  Earlier in the day the patient underwent injection of 1 mCi of technetium sulfur colloid in the subareolar position on both breast.  Previously an I-125 seed was placed in the left axilla to mark a previously positive lymph node.  Attention was first turned to the left breast.  An elliptical incision was made around the nipple and areola complex with a 10 blade knife in order to minimize the excess skin.  The incision was carried through the skin and subcutaneous tissue sharply with the PlasmaBlade.  Breast hooks were used to elevate the skin flaps anteriorly towards the ceiling.  Thin skin flaps were then created by dissecting between the  breast tissue and the subcutaneous fat and skin.  This dissection was carried circumferentially all the way to the chest wall muscle.  Next the breast was removed from the pectoralis muscle with the pectoralis fascia.  Once this was accomplished the entire left breast was removed from the patient.  It was marked with a stitch on the lateral skin and sent to pathology for further evaluation.  The neoprobe was set to I-125 in the lymph node with the radioactive seed was identified.  The deep left axillary space was entered sharply with the PlasmaBlade.  The neoprobe was used to direct [name] hemostat dissection.  Once the lymph node was identified it was excised sharply with the PlasmaBlade and the surrounding small vessels and lymphatics were controlled with clips.  This node was sent as targeted node.  The neoprobe was then set to technetium and I was able to identify another node with signal.  It was also excised sharply with the PlasmaBlade and the surrounding small vessels and lymphatics were controlled with clips.  Ex vivo counts on this node were approximately 3000.  No other hot or palpable nodes were identified in the deep left axillary space.  Hemostasis was achieved using the PlasmaBlade.  The wound was irrigated with copious amounts of saline.  A small stab incision was made near the anterior axillary line inferior to the operative bed.  A tonsil clamp was placed through this opening and used to bring a 19 [place] round Blake drain into the operative bed.  The drain was curled along the chest wall.  The drain was anchored to the skin with a 3-0 nylon stitch.  Next the superior and inferior skin flaps were grossly reapproximated with interrupted 3-0 Vicryl stitches.  The skin was then closed with running 4-0 Monocryl subcuticular stitches.  The drain was placed to bulb suction and there was a good seal.  Attention was then turned to the right breast.  A similar elliptical incision was made with a 10 blade  knife around the nipple areolar complex in order to minimize the excess skin.  The incision was carried through the skin and subcutaneous tissue sharply with the PlasmaBlade.  Breast hooks were used to elevate the skin flaps anteriorly towards the ceiling.  Thin skin flaps were then created by dissecting between the breast tissue and the subcutaneous fat and skin.  This dissection was carried circumferentially all the way to the chest wall muscle.  Next the breast was removed from the pectoralis muscle with the pectoralis fascia.  Once this dissection was accomplished then the entire right breast was removed from the patient.  It was marked with a stitch on the lateral skin and sent to pathology for further evaluation.  The neoprobe was set to technetium and I was able to identify a signal in the deep right axillary space.  The deep right axillary space was entered sharply with the PlasmaBlade.  The neoprobe was used to direct [name] hemostat dissection.  I was able to identify 3 lymph nodes with signal.  Each was excised sharply with the PlasmaBlade and the surrounding small vessels and lymphatics were controlled with clips.  A couple of these nodes likely had more than 1 node with it.  Ex vivo counts on these nodes ranged from 1000-2000.  No other hot or palpable nodes were identified in the right axilla.  Hemostasis was achieved using the PlasmaBlade.  The wound was then irrigated with copious amounts of saline.  A small stab incision was made near the anterior axillary line inferior to the operative bed.  A tonsil clamp was placed through this opening and used to bring a 19 [place] round Blake drain into the operative bed.  The drain was curled along the chest wall.  The drain was anchored to the skin with a 3-0 nylon stitch.  Next the superior and inferior skin flaps were grossly reapproximated with interrupted 3-0 Vicryl stitches.  The skin was then closed with running 4-0 Monocryl subcuticular stitches.   Dermabond dressings and sterile drain dressings were applied.  The drain was placed to bulb suction and there was a good seal.  At the end of the case all needle sponge and instrument counts were correct.  The patient tolerated the surgery well.  She was then awakened and taken to recovery in stable condition.  PLAN OF CARE: Admit for overnight observation  PATIENT DISPOSITION:  PACU - hemodynamically stable.   Delay start of Pharmacological VTE agent (>24hrs) due to surgical blood loss or risk of bleeding: no

## 2023-08-16 NOTE — Interval H&P Note (Signed)
 History and Physical Interval Note:  08/16/2023 7:15 AM  Donna [name]  has presented today for surgery, with the diagnosis of BILATERAL BREAST CANCER.  The various methods of treatment have been discussed with the patient and family. After consideration of risks, benefits and other options for treatment, the patient has consented to  Procedure(s) with comments: MASTECTOMY WITH SENTINEL LYMPH NODE BIOPSY (Bilateral) - GEN w/PEC BLOCK LEFT MASTY WITH SENTINEL NODE AND TARGETED NODE DISSECTION RIGHT MASTECTOMY WITH SENTINEL NODE LYMPHADENECTOMY, AXILLARY (Left) as a surgical intervention.  The patient's history has been reviewed, patient examined, no change in status, stable for surgery.  I have reviewed the patient's chart and labs.  Questions were answered to the patient's satisfaction.     [name]

## 2023-08-16 NOTE — Anesthesia Procedure Notes (Signed)
 Procedure Name: Intubation Date/Time: 08/16/2023 8:33 AM  Performed by: Mannie Krystal LABOR, CRNAPre-anesthesia Checklist: Patient identified, Emergency Drugs available, Suction available and Patient being monitored Patient Re-evaluated:Patient Re-evaluated prior to induction Oxygen Delivery Method: Circle system utilized Preoxygenation: Pre-oxygenation with 100% oxygen Induction Type: IV induction Ventilation: Mask ventilation without difficulty Laryngoscope Size: Mac and 3 Grade View: Grade I Tube type: Oral Tube size: 7.0 mm Number of attempts: 1 Airway Equipment and Method: Stylet and Oral airway Placement Confirmation: ETT inserted through vocal cords under direct vision, positive ETCO2 and breath sounds checked- equal and bilateral Secured at: 22 cm Tube secured with: Tape Dental Injury: Teeth and Oropharynx as per pre-operative assessment

## 2023-08-17 LAB — CBC
HCT: 40.2 % (ref 36.0–46.0)
Hemoglobin: 12.8 g/dL (ref 12.0–15.0)
MCH: 32.5 pg (ref 26.0–34.0)
MCHC: 31.8 g/dL (ref 30.0–36.0)
MCV: 102 fL — ABNORMAL HIGH (ref 80.0–100.0)
Platelets: 209 K/uL (ref 150–400)
RBC: 3.94 MIL/uL (ref 3.87–5.11)
RDW: 12.4 % (ref 11.5–15.5)
WBC: 12.1 K/uL — ABNORMAL HIGH (ref 4.0–10.5)
nRBC: 0 % (ref 0.0–0.2)

## 2023-08-17 LAB — BASIC METABOLIC PANEL WITH GFR
Anion gap: 7 (ref 5–15)
BUN: 21 mg/dL (ref 8–23)
CO2: 22 mmol/L (ref 22–32)
Calcium: 9.1 mg/dL (ref 8.9–10.3)
Chloride: 109 mmol/L (ref 98–111)
Creatinine, Ser: 1.12 mg/dL — ABNORMAL HIGH (ref 0.44–1.00)
GFR, Estimated: 53 mL/min — ABNORMAL LOW (ref >60)
Glucose, Bld: 111 mg/dL — ABNORMAL HIGH (ref 70–99)
Potassium: 3.7 mmol/L (ref 3.5–5.1)
Sodium: 138 mmol/L (ref 135–145)

## 2023-08-17 MED ORDER — NICOTINE 7 MG/24HR TD PT24
7.0000 mg | MEDICATED_PATCH | Freq: Every day | TRANSDERMAL | Status: DC
  Administered 2023-08-17 – 2023-08-25 (×9): 7 mg via TRANSDERMAL
  Filled 2023-08-17 (×9): qty 1

## 2023-08-17 MED ORDER — METHOCARBAMOL 500 MG PO TABS
500.0000 mg | ORAL_TABLET | Freq: Four times a day (QID) | ORAL | Status: DC
  Administered 2023-08-17 (×4): 500 mg via ORAL
  Filled 2023-08-17 (×4): qty 1

## 2023-08-17 MED ORDER — ACETAMINOPHEN 500 MG PO TABS
1000.0000 mg | ORAL_TABLET | Freq: Four times a day (QID) | ORAL | Status: DC
  Administered 2023-08-17 – 2023-08-25 (×28): 1000 mg via ORAL
  Filled 2023-08-17 (×31): qty 2

## 2023-08-17 NOTE — Plan of Care (Signed)
  Problem: Clinical Measurements: Goal: Will remain free from infection Outcome: Progressing   Problem: Pain Managment: Goal: General experience of comfort will improve and/or be controlled Outcome: Progressing   Problem: Skin Integrity: Goal: Risk for impaired skin integrity will decrease Outcome: Progressing

## 2023-08-17 NOTE — Progress Notes (Signed)
 Progress Note  1 Day Post-Op  Subjective: Pt reports 8/10 pain this AM, medication helps but still having some breakthrough. She felt a little weak and dizzy getting up this AM. Reports she has several family and friends who can help her but she does live alone.   Objective: Vital signs in last 24 hours: Temp:  [97.5 F (36.4 C)-98.6 F (37 C)] 98.5 F (36.9 C) (08/02 0804) Pulse Rate:  [68-86] 69 (08/02 0804) Resp:  [12-17] 14 (08/02 0804) BP: (96-145)/(55-77) 112/64 (08/02 0804) SpO2:  [86 %-95 %] 95 % (08/02 0804) Last BM Date :  (PTA)  Intake/Output from previous day: 08/01 0701 - 08/02 0700 In: 1240 [P.O.:240; I.V.:1000] Out: 490 [Urine:300; Drains:165; Blood:25] Intake/Output this shift: Total I/O In: 0  Out: 50 [Drains:50]  PE: General: pleasant, WD, WN female who is laying in bed in NAD Heart: regular, rate, and rhythm.  Lungs: Respiratory effort nonlabored Chest: incisions C/D/I bilaterally, drains with SS fluid, binder present  Abd: soft, NT, ND Psych: A&Ox3 with an appropriate affect.    Lab Results:  No results for input(s): WBC, HGB, HCT, PLT in the last 72 hours. BMET No results for input(s): NA, K, CL, CO2, GLUCOSE, BUN, CREATININE, CALCIUM  in the last 72 hours. PT/INR No results for input(s): LABPROT, INR in the last 72 hours. CMP     Component Value Date/Time   NA 145 08/01/2023 1019   K 3.7 08/01/2023 1019   CL 113 (H) 08/01/2023 1019   CO2 29 08/01/2023 1019   GLUCOSE 107 (H) 08/01/2023 1019   BUN 14 08/01/2023 1019   CREATININE 0.75 08/01/2023 1019   CALCIUM  9.7 08/01/2023 1019   PROT 6.1 (L) 08/01/2023 1019   ALBUMIN  3.7 08/01/2023 1019   AST 16 08/01/2023 1019   ALT 8 08/01/2023 1019   ALKPHOS 91 08/01/2023 1019   BILITOT 0.5 08/01/2023 1019   GFRNONAA >60 08/01/2023 1019   Lipase  No results found for: LIPASE     Studies/Results: MM BREAST SURGICAL SPECIMEN Result Date: 08/16/2023 CLINICAL  DATA:  Evaluate surgical specimen following excision of LEFT axillary lymph node. EXAM: SPECIMEN RADIOGRAPH OF THE LEFT AXILLA COMPARISON:  Previous exam(s). FINDINGS: Status post excision of the LEFT axilla. The radioactive seed and spiral biopsy clip are present and intact. IMPRESSION: Specimen radiograph of the LEFT axilla Electronically Signed   By: Reyes Phi M.D.   On: 08/16/2023 14:47   NM Sentinel Node Inj-No Rpt (Breast) Result Date: 08/16/2023 Lymphoseek was injected by the Nuclear Medicine Technologist for sentinel lymph node localization.   US  LT RADIOACTIVE SEED LOC Result Date: 08/15/2023 CLINICAL DATA:  73 year old female presents for radioactive seed localization of LEFT axillary lymph node with metastatic disease. EXAM: ULTRASOUND GUIDED RADIOACTIVE SEED LOCALIZATION OF THE LEFT AXILLA DIAGNOSTIC LEFT MAMMOGRAM POST ULTRASOUND-GUIDED RADIOACTIVE SEED PLACEMENT COMPARISON:  Previous exam(s). FINDINGS: Patient presents for radioactive seed localization prior to targeted LEFT axillary lymph node excision. I met with the patient and we discussed the procedure of seed localization including benefits and alternatives. We discussed the high likelihood of a successful procedure. We discussed the risks of the procedure including infection, bleeding, tissue injury and further surgery. We discussed the low dose of radioactivity involved in the procedure. Informed, written consent was given. The usual time-out protocol was performed immediately prior to the procedure. ULTRASOUND GUIDED RADIOACTIVE SEED LOCALIZATION OF THE LEFT AXILLA Using ultrasound guidance, sterile technique, 1% lidocaine  and an I-125 radioactive seed, the abnormal LEFT axillary lymph  node with cortical thickening and containing biopsy clip was localized using a MEDIAL approach. Satisfactory placement of the seed within this lymph node was confirmed sonographically. Follow-up survey of the patient confirms presence of the radioactive  seed. Order number of I-125 seed:  [id number]. Total activity:  0.241 millicuries.  Reference Date: 07/29/2023. DIAGNOSTIC LEFT MAMMOGRAM POST ULTRASOUND-GUIDED RADIOACTIVE SEED PLACEMENT Breast density: b: There are scattered areas of fibroglandular density. Mammographic images were obtained following ultrasound-guided radioactive seed placement. The abnormal left axillary lymph node containing seed and biopsy clip is not visualized due to far posterior position. The patient tolerated the procedure well and was released from the [facility]. She was given instructions regarding seed removal. IMPRESSION: Radioactive seed localization of the left axilla. No apparent complications. Electronically Signed   By: Reyes Phi M.D.   On: 08/15/2023 09:14   MM CLIP PLACEMENT LEFT Result Date: 08/15/2023 CLINICAL DATA:  73 year old female presents for radioactive seed localization of LEFT axillary lymph node with metastatic disease. EXAM: ULTRASOUND GUIDED RADIOACTIVE SEED LOCALIZATION OF THE LEFT AXILLA DIAGNOSTIC LEFT MAMMOGRAM POST ULTRASOUND-GUIDED RADIOACTIVE SEED PLACEMENT COMPARISON:  Previous exam(s). FINDINGS: Patient presents for radioactive seed localization prior to targeted LEFT axillary lymph node excision. I met with the patient and we discussed the procedure of seed localization including benefits and alternatives. We discussed the high likelihood of a successful procedure. We discussed the risks of the procedure including infection, bleeding, tissue injury and further surgery. We discussed the low dose of radioactivity involved in the procedure. Informed, written consent was given. The usual time-out protocol was performed immediately prior to the procedure. ULTRASOUND GUIDED RADIOACTIVE SEED LOCALIZATION OF THE LEFT AXILLA Using ultrasound guidance, sterile technique, 1% lidocaine  and an I-125 radioactive seed, the abnormal LEFT axillary lymph node with cortical thickening and containing biopsy clip was  localized using a MEDIAL approach. Satisfactory placement of the seed within this lymph node was confirmed sonographically. Follow-up survey of the patient confirms presence of the radioactive seed. Order number of I-125 seed:  [id number]. Total activity:  0.241 millicuries.  Reference Date: 07/29/2023. DIAGNOSTIC LEFT MAMMOGRAM POST ULTRASOUND-GUIDED RADIOACTIVE SEED PLACEMENT Breast density: b: There are scattered areas of fibroglandular density. Mammographic images were obtained following ultrasound-guided radioactive seed placement. The abnormal left axillary lymph node containing seed and biopsy clip is not visualized due to far posterior position. The patient tolerated the procedure well and was released from the [facility]. She was given instructions regarding seed removal. IMPRESSION: Radioactive seed localization of the left axilla. No apparent complications. Electronically Signed   By: Reyes Phi M.D.   On: 08/15/2023 09:14    Anti-infectives: Anti-infectives (From admission, onward)    Start     Dose/Rate Route Frequency Ordered Stop   08/16/23 0700  ceFAZolin  (ANCEF ) IVPB 2g/100 mL premix        2 g 200 mL/hr over 30 Minutes Intravenous On call to O.R. 08/16/23 [id number] 08/16/23 0835        Assessment/Plan  POD1 s/p bilateral mastectomy with sentinel node biopsies Dr. Curvin - drains functioning  - binder present - pt reported feeling dizzy and weak with getting up this AM - check labs and PT eval today - possible DC tomorrow if labs ok and after PT eval  - add scheduled tylenol  and schedule robaxin  to help with pain control   FEN: reg diet  VTE: plavix , LMWH ID: [email or web address]  pre-op    LOS: 1 day    Burnard JONELLE Louder, PA-C  [facility] 08/17/2023, 9:11 AM Please see Amion for pager number during day hours 7:00am-4:30pm

## 2023-08-17 NOTE — Care Management Obs Status (Signed)
 MEDICARE OBSERVATION STATUS NOTIFICATION   Patient Details  Name: Donna [name] MRN: [id number] Date of Birth: 1950/02/26   Medicare Observation Status Notification Given:  Yes    Robynn Eileen Hoose, RN 08/17/2023, 4:58 PM

## 2023-08-17 NOTE — Evaluation (Signed)
 Physical Therapy One-Time Evaluation Patient Details Name: Donna [name] MRN: [id number] DOB: 19-Apr-1950 Today's Date: 08/17/2023  History of Present Illness  Donna [name] is a 73 y.o. female who presented 08/16/23 for bilateral mastectomy with sentinel lymph node biopsies secondary to breast cancer. 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  Clinical Impression  Pt admitted with above diagnosis. PTA, pt was independent for household ambulation, modI for community ambulation using a rollator, and independent with ADLs/IADLs. She lives alone in a one story house with 2 STE. Pt currently with functional limitations due to the deficits listed below (see PT Problem List). She performed bed mobility with modI, transfers using RW with modI, and required supervision for gait using RW, and CGA for stairs. Pt managed pericare and hygiene independently in the bathroom. She ambulated ~361ft and ascended/descended 2 steps with unilateral UE support. Discussed how family could support her during functional mobility and ADLs. Educated pt on HEP and provided her with a handout. Recommend OPPT for follow-up therapy. Patient feels ready and safe for discharge home. I have answered all her questions related to mobility.      If plan is discharge home, recommend the following: A little help with bathing/dressing/bathroom;Assistance with cooking/housework;Assist for transportation;Help with stairs or ramp for entrance   Can travel by private vehicle        Equipment Recommendations BSC/3in1  Recommendations for Other Services       Functional Status Assessment Patient has had a recent decline in their functional status and demonstrates the ability to make significant improvements in function in a reasonable and predictable amount of time.     Precautions / Restrictions Precautions Precautions: Fall Recall of Precautions/Restrictions: Intact Restrictions Weight  Bearing Restrictions Per Provider Order: No      Mobility  Bed Mobility Overal bed mobility: Modified Independent             General bed mobility comments: Pt performed supine<>sit with HOB elevated to ~20deg.    Transfers Overall transfer level: Modified independent Equipment used: Rolling walker (2 wheels)               General transfer comment: Pt performed sit<>stand from lowest bed height and commode. She demonstrated proper hand placement using RW. Powered up without physical assist. Good eccentric control with sitting.    Ambulation/Gait Ambulation/Gait assistance: Supervision Gait Distance (Feet): 350 Feet Assistive device: Rolling walker (2 wheels) Gait Pattern/deviations: WFL(Within Functional Limits), Step-through pattern Gait velocity: reduced Gait velocity interpretation: <1.8 ft/sec, indicate of risk for recurrent falls   General Gait Details: Pt ambulated with a reciprocal gait pattern, equal weight shift, good foot clearence, and good proximity to RW. She navigated room/hallway well. No LOB.  Stairs Stairs: Yes Stairs assistance: Contact guard assist Stair Management: One rail Right, Forwards, Step to pattern Number of Stairs: 2 General stair comments: Pt ascended with RLE and descended with LLE with unilat UE support. CGA for safety/stability.  Wheelchair Mobility     Tilt Bed    Modified Rankin (Stroke Patients Only)       Balance Overall balance assessment: Mild deficits observed, not formally tested                                           Pertinent Vitals/Pain Pain Assessment Pain Assessment: No/denies pain    Home Living Family/patient expects to  be discharged to:: Private residence Living Arrangements: Alone Available Help at Discharge: Family;Friend(s);Available PRN/intermittently Type of Home: House Home Access: Stairs to enter Entrance Stairs-Rails: None Entrance Stairs-Number of Steps: 2   Home  Layout: One level Home Equipment: Patent examiner (4 wheels);Other (comment);Shower seat (hurri-cane)      Prior Function Prior Level of Function : Independent/Modified Independent             Mobility Comments: Ambulates without AD inside the house, occasionally uses furniture for support and with rollator in community. Denies falls in the past 13mo. ADLs Comments: Indep with ADLs. Manages medications and household - cooking/cleaning. Pt doesn't drive relies on family for transportations and some IADLs.     Extremity/Trunk Assessment   Upper Extremity Assessment Upper Extremity Assessment: RUE deficits/detail;LUE deficits/detail RUE Deficits / Details: Shoulder AROM is slightly limited. Elbow, wrist, and hand AROM WFL. Grossly 3/5 strength, did not resist. RUE: Unable to fully assess due to pain RUE Sensation: WNL RUE Coordination: WNL LUE Deficits / Details: Shoulder AROM is slightly limited. Elbow, wrist, and hand AROM WFL. Grossly 3/5 strength, did not resist. LUE: Unable to fully assess due to pain LUE Sensation: WNL LUE Coordination: WNL    Lower Extremity Assessment Lower Extremity Assessment: Overall WFL for tasks assessed    Cervical / Trunk Assessment Cervical / Trunk Assessment: Normal  Communication   Communication Communication: No apparent difficulties    Cognition Arousal: Alert Behavior During Therapy: WFL for tasks assessed/performed   PT - Cognitive impairments: No apparent impairments                       PT - Cognition Comments: Pt A,Ox4 Following commands: Intact       Cueing Cueing Techniques: Verbal cues     General Comments      Exercises Other Exercises Other Exercises: Provided pt with HEP handout s/p mastectomy. Educated pt on exercises and demonstrated correct technique. She verbalized understanding.   Assessment/Plan    PT Assessment All further PT needs can be met in the next venue of care  PT Problem List  Decreased strength;Decreased range of motion;Decreased activity tolerance;Decreased balance       PT Treatment Interventions      PT Goals (Current goals can be found in the Care Plan section)  Acute Rehab PT Goals Patient Stated Goal: Return Home    Frequency       Co-evaluation               AM-PAC PT 6 Clicks Mobility  Outcome Measure Help needed turning from your back to your side while in a flat bed without using bedrails?: None Help needed moving from lying on your back to sitting on the side of a flat bed without using bedrails?: None Help needed moving to and from a bed to a chair (including a wheelchair)?: None Help needed standing up from a chair using your arms (e.g., wheelchair or bedside chair)?: None Help needed to walk in hospital room?: A Little Help needed climbing 3-5 steps with a railing? : A Little 6 Click Score: 22    End of Session   Activity Tolerance: Patient tolerated treatment well Patient left: in bed;with call bell/phone within reach;with family/visitor present Nurse Communication: Mobility status PT Visit Diagnosis: Other abnormalities of gait and mobility (R26.89)    Time: 8675-8642 PT Time Calculation (min) (ACUTE ONLY): 33 min   Charges:   PT Evaluation $PT Eval Low Complexity: 1 Low  PT Treatments $Gait Training: 8-22 mins PT General Charges $$ ACUTE PT VISIT: 1 Visit         Randall SAUNDERS, PT, DPT Acute Rehabilitation Services Office: [phone] Secure Chat Preferred  Donna [name] 08/17/2023, 2:55 PM

## 2023-08-17 NOTE — Plan of Care (Signed)
  Problem: Clinical Measurements: Goal: Ability to maintain clinical measurements within normal limits will improve Outcome: Progressing Goal: Will remain free from infection Outcome: Progressing Goal: Diagnostic test results will improve Outcome: Progressing   Problem: Activity: Goal: Risk for activity intolerance will decrease Outcome: Progressing   Problem: Nutrition: Goal: Adequate nutrition will be maintained Outcome: Progressing   Problem: Coping: Goal: Level of anxiety will decrease Outcome: Progressing   

## 2023-08-18 MED ORDER — METHOCARBAMOL 750 MG PO TABS
750.0000 mg | ORAL_TABLET | Freq: Four times a day (QID) | ORAL | Status: DC
  Administered 2023-08-18 – 2023-08-25 (×27): 750 mg via ORAL
  Filled 2023-08-18 (×10): qty 1
  Filled 2023-08-18: qty 2
  Filled 2023-08-18 (×10): qty 1
  Filled 2023-08-18: qty 2
  Filled 2023-08-18 (×5): qty 1

## 2023-08-18 MED ORDER — DOCUSATE SODIUM 100 MG PO CAPS
100.0000 mg | ORAL_CAPSULE | Freq: Two times a day (BID) | ORAL | Status: DC
  Administered 2023-08-18 – 2023-08-25 (×15): 100 mg via ORAL
  Filled 2023-08-18 (×15): qty 1

## 2023-08-18 MED ORDER — SIMETHICONE 80 MG PO CHEW
80.0000 mg | CHEWABLE_TABLET | Freq: Four times a day (QID) | ORAL | Status: DC
  Administered 2023-08-18 – 2023-08-25 (×27): 80 mg via ORAL
  Filled 2023-08-18 (×26): qty 1

## 2023-08-18 MED ORDER — PANTOPRAZOLE SODIUM 40 MG PO TBEC
40.0000 mg | DELAYED_RELEASE_TABLET | Freq: Every day | ORAL | Status: DC
  Administered 2023-08-18 – 2023-08-24 (×7): 40 mg via ORAL
  Filled 2023-08-18 (×7): qty 1

## 2023-08-18 NOTE — Plan of Care (Signed)
  Problem: Education: Goal: Knowledge of General Education information will improve Description: Including pain rating scale, medication(s)/side effects and non-pharmacologic comfort measures Outcome: Progressing   Problem: Health Behavior/Discharge Planning: Goal: Ability to manage health-related needs will improve Outcome: Progressing   Problem: Clinical Measurements: Goal: Will remain free from infection Outcome: Progressing   Problem: Activity: Goal: Risk for activity intolerance will decrease Outcome: Progressing   Problem: Coping: Goal: Level of anxiety will decrease Outcome: Progressing   

## 2023-08-18 NOTE — Progress Notes (Signed)
 Mobility Specialist Progress Note:    08/18/23 1146  Mobility  Activity Ambulated with assistance (In hallway)  Level of Assistance Contact guard assist, steadying assist  Assistive Device Front wheel walker  Distance Ambulated (ft) 350 ft  Activity Response Tolerated well  Mobility Referral Yes  Mobility visit 1 Mobility  Mobility Specialist Start Time (ACUTE ONLY) 1104  Mobility Specialist Stop Time (ACUTE ONLY) 1117  Mobility Specialist Time Calculation (min) (ACUTE ONLY) 13 min   Received pt in bed and agreeable to mobility. Pt required no physical assistance. No c/o. Returned to room without fault. Left pt EOB to eat lunch tray. Personal belongings and call light within reach. All needs met.  Lavanda Pollack Mobility Specialist  Please contact via [employer] or  Rehab Office [phone]

## 2023-08-18 NOTE — Progress Notes (Signed)
 Progress Note  2 Days Post-Op  Subjective: Pt reports weakness and dizziness are a little better but having increased pain this AM. Pain is a tearing feeling in right lateral chest and also having some abdominal gas pains. Was passing flatus yesterday but less today. Discussed mobilization and will plan to keep patient today.   Objective: Vital signs in last 24 hours: Temp:  [97.9 F (36.6 C)-99.2 F (37.3 C)] 98.2 F (36.8 C) (08/03 0801) Pulse Rate:  [70-75] 71 (08/03 0801) Resp:  [16-17] 16 (08/03 0801) BP: (97-129)/(62-64) 128/64 (08/03 0801) SpO2:  [93 %-98 %] 96 % (08/03 0801) Last BM Date :  (PTA)  Intake/Output from previous day: 08/02 0701 - 08/03 0700 In: 118 [P.O.:118] Out: 220 [Drains:220] Intake/Output this shift: No intake/output data recorded.  PE: General: pleasant, WD, WN female who is laying in bed in NAD Heart: regular, rate, and rhythm.  Lungs: Respiratory effort nonlabored Chest: incisions C/D/I bilaterally no signs of significant collection or hematoma, drains with SS fluid and functioning well, binder present  Abd: soft, NT, ND Psych: A&Ox3 with an appropriate affect.    Lab Results:  Recent Labs    08/17/23 1623  WBC 12.1*  HGB 12.8  HCT 40.2  PLT 209   BMET Recent Labs    08/17/23 1623  NA 138  K 3.7  CL 109  CO2 22  GLUCOSE 111*  BUN 21  CREATININE 1.12*  CALCIUM  9.1   PT/INR No results for input(s): LABPROT, INR in the last 72 hours. CMP     Component Value Date/Time   NA 138 08/17/2023 1623   K 3.7 08/17/2023 1623   CL 109 08/17/2023 1623   CO2 22 08/17/2023 1623   GLUCOSE 111 (H) 08/17/2023 1623   BUN 21 08/17/2023 1623   CREATININE 1.12 (H) 08/17/2023 1623   CREATININE 0.75 08/01/2023 1019   CALCIUM  9.1 08/17/2023 1623   PROT 6.1 (L) 08/01/2023 1019   ALBUMIN  3.7 08/01/2023 1019   AST 16 08/01/2023 1019   ALT 8 08/01/2023 1019   ALKPHOS 91 08/01/2023 1019   BILITOT 0.5 08/01/2023 1019   GFRNONAA 53 (L)  08/17/2023 1623   GFRNONAA >60 08/01/2023 1019   Lipase  No results found for: LIPASE     Studies/Results: MM BREAST SURGICAL SPECIMEN Result Date: 08/16/2023 CLINICAL DATA:  Evaluate surgical specimen following excision of LEFT axillary lymph node. EXAM: SPECIMEN RADIOGRAPH OF THE LEFT AXILLA COMPARISON:  Previous exam(s). FINDINGS: Status post excision of the LEFT axilla. The radioactive seed and spiral biopsy clip are present and intact. IMPRESSION: Specimen radiograph of the LEFT axilla Electronically Signed   By: Reyes Phi M.D.   On: 08/16/2023 14:47   NM Sentinel Node Inj-No Rpt (Breast) Result Date: 08/16/2023 Lymphoseek was injected by the Nuclear Medicine Technologist for sentinel lymph node localization.    Anti-infectives: Anti-infectives (From admission, onward)    Start     Dose/Rate Route Frequency Ordered Stop   08/16/23 0700  ceFAZolin  (ANCEF ) IVPB 2g/100 mL premix        2 g 200 mL/hr over 30 Minutes Intravenous On call to O.R. 08/16/23 [id number] 08/16/23 0835        Assessment/Plan  POD2 s/p bilateral mastectomy with sentinel node biopsies Dr. Curvin - drains functioning  - binder present - PT recommended outpatient PT - ordered  - simethicone  and mobilization for gas pain - increased scheduled robaxin  to help with right lateral chest pain - possible DC tomorrow  FEN: reg diet  VTE: plavix , LMWH ID: [email or web address]  pre-op    LOS: 1 day    Burnard JONELLE Louder, [facility] 08/18/2023, 9:12 AM Please see Amion for pager number during day hours 7:00am-4:30pm

## 2023-08-19 ENCOUNTER — Encounter (HOSPITAL_COMMUNITY): Payer: Self-pay | Admitting: General Surgery

## 2023-08-19 MED ORDER — CHLORHEXIDINE GLUCONATE CLOTH 2 % EX PADS
6.0000 | MEDICATED_PAD | Freq: Every day | CUTANEOUS | Status: DC
  Administered 2023-08-19 – 2023-08-24 (×6): 6 via TOPICAL

## 2023-08-19 NOTE — TOC Initial Note (Signed)
 Transition of Care (TOC) - Initial/Assessment Note   Spoke to patient at bedside.   Clarified dispostion plan with Burnard PARAS PA with [facility].   Burnard ordered OP PT at [facility]. Patient in agreement. Information placed on AVS.   Patient does need bedside commode. Messaged Kelly to co sign progress note for same. Called Arthea with [employer].  Daughter will provide transportation home at time of discharge  Patient Details  Name: Donna [name] MRN: [id number] Date of Birth: October 14, 1950  Transition of Care [facility]) CM/SW Contact:    Stephane Powell Jansky, RN Phone Number: 08/19/2023, 10:35 AM  Clinical Narrative:                   Expected Discharge Plan: Home/Self Care Barriers to Discharge: Continued Medical Work up   Patient Goals and CMS Choice Patient states their goals for this hospitalization and ongoing recovery are:: to return to home          Expected Discharge Plan and Services   Discharge Planning Services: CM Consult Post Acute Care Choice: NA Living arrangements for the past 2 months: Single Family Home                 DME Arranged: Bedside commode DME Agency: [employer] Date DME Agency Contacted: 08/19/23 Time DME Agency Contacted: 1034 Representative spoke with at DME Agency: Arthea HH Arranged: NA          Prior Living Arrangements/Services Living arrangements for the past 2 months: Single Family Home Lives with:: Self Patient language and need for interpreter reviewed:: Yes Do you feel safe going back to the place where you live?: Yes      Need for Family Participation in Patient Care: No (Comment) Care giver support system in place?: Yes (comment)   Criminal Activity/Legal Involvement Pertinent to Current Situation/Hospitalization: No - Comment as needed  Activities of Daily Living   ADL Screening (condition at time of admission) Independently performs ADLs?: Yes (appropriate for developmental age) Is the patient deaf or have difficulty  hearing?: No Does the patient have difficulty seeing, even when wearing glasses/contacts?: No Does the patient have difficulty concentrating, remembering, or making decisions?: No  Permission Sought/Granted   Permission granted to share information with : Yes, Verbal Permission Granted     Permission granted to share info w AGENCY: [employer]        Emotional Assessment Appearance:: Appears stated age Attitude/Demeanor/Rapport: Engaged Affect (typically observed): Appropriate Orientation: : Oriented to Self, Oriented to Place, Oriented to  Time, Oriented to Situation Alcohol / Substance Use: Not Applicable Psych Involvement: No (comment)  Admission diagnosis:  S/P mastectomy, bilateral [Z90.13] Bilateral breast cancer (HCC) [C50.911, C50.912] Patient Active Problem List   Diagnosis Date Noted   S/P mastectomy, bilateral 08/16/2023   Bilateral breast cancer (HCC) 08/16/2023   Port-A-Cath in place 01/24/2023   Genetic testing 01/17/2023   Malignant neoplasm of overlapping sites of right breast in female, estrogen receptor negative (HCC) 12/31/2022   Malignant neoplasm of upper-outer quadrant of left breast in female, estrogen receptor positive (HCC) 12/31/2022   Cough    Cigarette nicotine  dependence without complication    Stroke (HCC) 05/25/2020   Primary hypertension    PCP:  [name], FNP Pharmacy:   [facility] [phone] [address], [place] - [address] AT [address] [place] [address] Phone: [phone] Fax: [phone]     Social Drivers of Health (SDOH) Social History: SDOH Screenings   Food Insecurity:  No Food Insecurity (08/16/2023)  Housing: Low Risk  (08/16/2023)  Transportation Needs: No Transportation Needs (08/16/2023)  Utilities: Not At Risk (08/16/2023)  Financial Resource Strain: Not at Risk (04/04/2022)   Received from [facility]  Physical Activity: Not on File (05/04/2021)   Received from [facility]  Social Connections: Moderately  Integrated (08/16/2023)  Stress: Not on File (05/04/2021)   Received from [facility]  Tobacco Use: High Risk (08/16/2023)   SDOH Interventions:     Readmission Risk Interventions     No data to display

## 2023-08-19 NOTE — TOC CM/SW Note (Signed)
    Durable Medical Equipment  (From admission, onward)           Start     Ordered   08/18/23 0912  For home use only DME Bedside commode  Once       Question:  Patient needs a bedside commode to treat with the following condition  Answer:  S/P bilateral mastectomy   08/18/23 0912            Patient confined to a room with no bathroom therefore needs a bedside commode.

## 2023-08-19 NOTE — Progress Notes (Signed)
 Mobility Specialist Progress Note:    08/19/23 1220  Mobility  Activity Ambulated with assistance (In hallway)  Level of Assistance Contact guard assist, steadying assist  Assistive Device Front wheel walker  Distance Ambulated (ft) 250 ft  Activity Response Tolerated well  Mobility Referral Yes  Mobility visit 1 Mobility  Mobility Specialist Start Time (ACUTE ONLY) 1203  Mobility Specialist Stop Time (ACUTE ONLY) 1219  Mobility Specialist Time Calculation (min) (ACUTE ONLY) 16 min   Received pt in bed and agreeable to mobility. No physical assistance needed. No c/o. Returned to room without fault. Left EOB to eat lunch. Personal belongings and call light within reach. All needs met.  Donna [name] Mobility Specialist  Please contact via [employer] or  Rehab Office [phone]

## 2023-08-20 NOTE — Plan of Care (Signed)
  Problem: Education: Goal: Knowledge of General Education information will improve Description: Including pain rating scale, medication(s)/side effects and non-pharmacologic comfort measures 08/20/2023 2029 by Draedyn Weidinger K, RN Outcome: Progressing 08/20/2023 2029 by Florian Dellis POUR, RN Outcome: Progressing   Problem: Health Behavior/Discharge Planning: Goal: Ability to manage health-related needs will improve 08/20/2023 2029 by Anielle Headrick K, RN Outcome: Progressing 08/20/2023 2029 by Ramces Shomaker K, RN Outcome: Progressing   Problem: Clinical Measurements: Goal: Ability to maintain clinical measurements within normal limits will improve 08/20/2023 2029 by Samil Mecham K, RN Outcome: Progressing 08/20/2023 2029 by Amandamarie Feggins K, RN Outcome: Progressing Goal: Will remain free from infection 08/20/2023 2029 by Florian Dellis POUR, RN Outcome: Progressing 08/20/2023 2029 by Shanique Aslinger K, RN Outcome: Progressing Goal: Diagnostic test results will improve 08/20/2023 2029 by Rahcel Shutes K, RN Outcome: Progressing 08/20/2023 2029 by Maxyne Derocher K, RN Outcome: Progressing Goal: Respiratory complications will improve 08/20/2023 2029 by Tennille Montelongo K, RN Outcome: Progressing 08/20/2023 2029 by Antoniette Peake K, RN Outcome: Progressing Goal: Cardiovascular complication will be avoided 08/20/2023 2029 by Quest Tavenner K, RN Outcome: Progressing 08/20/2023 2029 by Samwise Eckardt K, RN Outcome: Progressing   Problem: Activity: Goal: Risk for activity intolerance will decrease 08/20/2023 2029 by Zaelyn Barbary K, RN Outcome: Progressing 08/20/2023 2029 by Florian Dellis POUR, RN Outcome: Progressing   Problem: Nutrition: Goal: Adequate nutrition will be maintained 08/20/2023 2029 by Florian Dellis POUR, RN Outcome: Progressing 08/20/2023 2029 by Florian Dellis POUR, RN Outcome: Progressing   Problem: Coping: Goal: Level of anxiety will decrease 08/20/2023 2029 by Florian Dellis POUR, RN Outcome:  Progressing 08/20/2023 2029 by Florian Dellis POUR, RN Outcome: Progressing   Problem: Elimination: Goal: Will not experience complications related to bowel motility 08/20/2023 2029 by Florian Dellis POUR, RN Outcome: Progressing 08/20/2023 2029 by Florian Dellis POUR, RN Outcome: Progressing Goal: Will not experience complications related to urinary retention 08/20/2023 2029 by Florian Dellis POUR, RN Outcome: Progressing 08/20/2023 2029 by Florian Dellis POUR, RN Outcome: Progressing   Problem: Pain Managment: Goal: General experience of comfort will improve and/or be controlled 08/20/2023 2029 by Nilam Quakenbush K, RN Outcome: Progressing 08/20/2023 2029 by Radley Teston K, RN Outcome: Progressing   Problem: Safety: Goal: Ability to remain free from injury will improve 08/20/2023 2029 by Valen Mascaro K, RN Outcome: Progressing 08/20/2023 2029 by Orvell Careaga K, RN Outcome: Progressing   Problem: Skin Integrity: Goal: Risk for impaired skin integrity will decrease 08/20/2023 2029 by Alleah Dearman K, RN Outcome: Progressing 08/20/2023 2029 by Janoah Menna K, RN Outcome: Progressing

## 2023-08-20 NOTE — Plan of Care (Signed)

## 2023-08-20 NOTE — Plan of Care (Signed)
  Problem: Coping: Goal: Level of anxiety will decrease Outcome: Progressing   Problem: Pain Managment: Goal: General experience of comfort will improve and/or be controlled Outcome: Progressing   Problem: Safety: Goal: Ability to remain free from injury will improve Outcome: Progressing   Problem: Skin Integrity: Goal: Risk for impaired skin integrity will decrease Outcome: Progressing

## 2023-08-20 NOTE — Progress Notes (Signed)
 Mobility Specialist Progress Note:   08/20/23 1428  Mobility  Activity Refused and notified nurse if applicable   Pt refused mobility. Will f/u as able.    Lavanda Pollack Mobility Specialist  Please contact via [employer] or  Rehab Office [phone]

## 2023-08-21 ENCOUNTER — Observation Stay (HOSPITAL_COMMUNITY)

## 2023-08-21 ENCOUNTER — Other Ambulatory Visit: Payer: Self-pay

## 2023-08-21 ENCOUNTER — Encounter (HOSPITAL_COMMUNITY): Payer: Self-pay | Admitting: General Surgery

## 2023-08-21 ENCOUNTER — Encounter (HOSPITAL_COMMUNITY)
Admission: RE | Disposition: A | Discharge status: Home / Self Care | Payer: Self-pay | Source: Home / Self Care | Attending: General Surgery

## 2023-08-21 DIAGNOSIS — F32A Depression, unspecified: Secondary | ICD-10-CM | POA: Diagnosis present

## 2023-08-21 DIAGNOSIS — C50411 Malignant neoplasm of upper-outer quadrant of right female breast: Secondary | ICD-10-CM | POA: Diagnosis present

## 2023-08-21 DIAGNOSIS — R7881 Bacteremia: Secondary | ICD-10-CM | POA: Diagnosis present

## 2023-08-21 DIAGNOSIS — I10 Essential (primary) hypertension: Secondary | ICD-10-CM | POA: Diagnosis present

## 2023-08-21 DIAGNOSIS — Y848 Other medical procedures as the cause of abnormal reaction of the patient, or of later complication, without mention of misadventure at the time of the procedure: Secondary | ICD-10-CM | POA: Diagnosis not present

## 2023-08-21 DIAGNOSIS — Z171 Estrogen receptor negative status [ER-]: Secondary | ICD-10-CM | POA: Diagnosis not present

## 2023-08-21 DIAGNOSIS — S21009A Unspecified open wound of unspecified breast, initial encounter: Secondary | ICD-10-CM | POA: Diagnosis not present

## 2023-08-21 DIAGNOSIS — L7632 Postprocedural hematoma of skin and subcutaneous tissue following other procedure: Secondary | ICD-10-CM | POA: Diagnosis not present

## 2023-08-21 DIAGNOSIS — I679 Cerebrovascular disease, unspecified: Secondary | ICD-10-CM | POA: Diagnosis not present

## 2023-08-21 DIAGNOSIS — I69354 Hemiplegia and hemiparesis following cerebral infarction affecting left non-dominant side: Secondary | ICD-10-CM | POA: Diagnosis not present

## 2023-08-21 DIAGNOSIS — E872 Acidosis, unspecified: Secondary | ICD-10-CM | POA: Diagnosis not present

## 2023-08-21 DIAGNOSIS — C50412 Malignant neoplasm of upper-outer quadrant of left female breast: Secondary | ICD-10-CM | POA: Diagnosis present

## 2023-08-21 DIAGNOSIS — F418 Other specified anxiety disorders: Secondary | ICD-10-CM

## 2023-08-21 DIAGNOSIS — J9601 Acute respiratory failure with hypoxia: Secondary | ICD-10-CM | POA: Diagnosis not present

## 2023-08-21 DIAGNOSIS — S20211A Contusion of right front wall of thorax, initial encounter: Secondary | ICD-10-CM | POA: Diagnosis not present

## 2023-08-21 DIAGNOSIS — Z170 Estrogen receptor positive status [ER+]: Secondary | ICD-10-CM | POA: Diagnosis not present

## 2023-08-21 DIAGNOSIS — Z1732 Human epidermal growth factor receptor 2 negative status: Secondary | ICD-10-CM | POA: Diagnosis not present

## 2023-08-21 DIAGNOSIS — Z1731 Human epidermal growth factor receptor 2 positive status: Secondary | ICD-10-CM | POA: Diagnosis not present

## 2023-08-21 DIAGNOSIS — F17210 Nicotine dependence, cigarettes, uncomplicated: Secondary | ICD-10-CM | POA: Diagnosis present

## 2023-08-21 DIAGNOSIS — R578 Other shock: Secondary | ICD-10-CM | POA: Diagnosis not present

## 2023-08-21 DIAGNOSIS — D62 Acute posthemorrhagic anemia: Secondary | ICD-10-CM | POA: Diagnosis not present

## 2023-08-21 DIAGNOSIS — Z1722 Progesterone receptor negative status: Secondary | ICD-10-CM | POA: Diagnosis not present

## 2023-08-21 DIAGNOSIS — Z9013 Acquired absence of bilateral breasts and nipples: Secondary | ICD-10-CM | POA: Diagnosis present

## 2023-08-21 DIAGNOSIS — D72829 Elevated white blood cell count, unspecified: Secondary | ICD-10-CM | POA: Diagnosis not present

## 2023-08-21 HISTORY — PX: INCISION AND DRAINAGE OF WOUND: SHX1803

## 2023-08-21 LAB — GLUCOSE, CAPILLARY: Glucose-Capillary: 194 mg/dL — ABNORMAL HIGH (ref 70–99)

## 2023-08-21 LAB — CBC WITH DIFFERENTIAL/PLATELET
Abs Granulocyte: 9.7 K/uL — ABNORMAL HIGH (ref 1.5–6.5)
Abs Immature Granulocytes: 0.1 K/uL — ABNORMAL HIGH (ref 0.00–0.07)
Basophils Absolute: 0 K/uL (ref 0.0–0.1)
Basophils Relative: 0 %
Eosinophils Absolute: 0.1 K/uL (ref 0.0–0.5)
Eosinophils Relative: 1 %
HCT: 18.7 % — ABNORMAL LOW (ref 36.0–46.0)
Hemoglobin: 5.7 g/dL — CL (ref 12.0–15.0)
Immature Granulocytes: 1 %
Lymphocytes Relative: 28 %
Lymphs Abs: 4 K/uL (ref 0.7–4.0)
MCH: 32.2 pg (ref 26.0–34.0)
MCHC: 30.5 g/dL (ref 30.0–36.0)
MCV: 105.6 fL — ABNORMAL HIGH (ref 80.0–100.0)
Monocytes Absolute: 0.6 K/uL (ref 0.1–1.0)
Monocytes Relative: 4 %
Neutro Abs: 9.7 K/uL — ABNORMAL HIGH (ref 1.7–7.7)
Neutrophils Relative %: 66 %
Platelets: 210 K/uL (ref 150–400)
RBC: 1.77 MIL/uL — ABNORMAL LOW (ref 3.87–5.11)
RDW: 12.9 % (ref 11.5–15.5)
WBC: 14.5 K/uL — ABNORMAL HIGH (ref 4.0–10.5)
nRBC: 0.1 % (ref 0.0–0.2)

## 2023-08-21 LAB — POCT I-STAT EG7
Acid-base deficit: 9 mmol/L — ABNORMAL HIGH (ref 0.0–2.0)
Bicarbonate: 19.5 mmol/L — ABNORMAL LOW (ref 20.0–28.0)
Calcium, Ion: 1.29 mmol/L (ref 1.15–1.40)
HCT: 21 % — ABNORMAL LOW (ref 36.0–46.0)
Hemoglobin: 7.1 g/dL — ABNORMAL LOW (ref 12.0–15.0)
O2 Saturation: 44 %
Patient temperature: 35.6
Potassium: 4.2 mmol/L (ref 3.5–5.1)
Sodium: 142 mmol/L (ref 135–145)
TCO2: 21 mmol/L — ABNORMAL LOW (ref 22–32)
pCO2, Ven: 53 mmHg (ref 44–60)
pH, Ven: 7.166 — CL (ref 7.25–7.43)
pO2, Ven: 29 mmHg — CL (ref 32–45)

## 2023-08-21 LAB — CBC
HCT: 25.2 % — ABNORMAL LOW (ref 36.0–46.0)
Hemoglobin: 8 g/dL — ABNORMAL LOW (ref 12.0–15.0)
MCH: 32.4 pg (ref 26.0–34.0)
MCHC: 31.7 g/dL (ref 30.0–36.0)
MCV: 102 fL — ABNORMAL HIGH (ref 80.0–100.0)
Platelets: 165 K/uL (ref 150–400)
RBC: 2.47 MIL/uL — ABNORMAL LOW (ref 3.87–5.11)
RDW: 14.4 % (ref 11.5–15.5)
WBC: 17.3 K/uL — ABNORMAL HIGH (ref 4.0–10.5)
nRBC: 0 % (ref 0.0–0.2)

## 2023-08-21 LAB — PREPARE RBC (CROSSMATCH)

## 2023-08-21 LAB — ABO/RH: ABO/RH(D): O POS

## 2023-08-21 LAB — BASIC METABOLIC PANEL WITH GFR
Anion gap: 9 (ref 5–15)
BUN: 18 mg/dL (ref 8–23)
CO2: 19 mmol/L — ABNORMAL LOW (ref 22–32)
Calcium: 7.9 mg/dL — ABNORMAL LOW (ref 8.9–10.3)
Chloride: 114 mmol/L — ABNORMAL HIGH (ref 98–111)
Creatinine, Ser: 0.83 mg/dL (ref 0.44–1.00)
GFR, Estimated: >60 mL/min (ref >60)
Glucose, Bld: 155 mg/dL — ABNORMAL HIGH (ref 70–99)
Potassium: 4.2 mmol/L (ref 3.5–5.1)
Sodium: 142 mmol/L (ref 135–145)

## 2023-08-21 LAB — TROPONIN I (HIGH SENSITIVITY)
Troponin I (High Sensitivity): 4 ng/L (ref <18)
Troponin I (High Sensitivity): 4 ng/L (ref <18)

## 2023-08-21 LAB — MRSA NEXT GEN BY PCR, NASAL: MRSA by PCR Next Gen: NOT DETECTED

## 2023-08-21 SURGERY — IRRIGATION AND DEBRIDEMENT WOUND
Anesthesia: General | Laterality: Right

## 2023-08-21 MED ORDER — PHENYLEPHRINE HCL-NACL 20-0.9 MG/250ML-% IV SOLN: INTRAVENOUS | Status: DC | PRN

## 2023-08-21 MED ORDER — LACTATED RINGERS IV SOLN: INTRAVENOUS | Status: DC

## 2023-08-21 MED ORDER — PHENYLEPHRINE 80 MCG/ML (10ML) SYRINGE FOR IV PUSH (FOR BLOOD PRESSURE SUPPORT)
PREFILLED_SYRINGE | INTRAVENOUS | Status: AC
  Filled 2023-08-21: qty 10

## 2023-08-21 MED ORDER — SODIUM CHLORIDE 0.9% IV SOLUTION: Freq: Once | INTRAVENOUS | Status: AC

## 2023-08-21 MED ORDER — SUGAMMADEX SODIUM 200 MG/2ML IV SOLN
INTRAVENOUS | Status: DC | PRN
  Administered 2023-08-21: 150 mg via INTRAVENOUS

## 2023-08-21 MED ORDER — TRANEXAMIC ACID 1000 MG/10ML IV SOLN
2000.0000 mg | Freq: Once | INTRAVENOUS | Status: AC
  Administered 2023-08-21: 2000 mg via TOPICAL
  Filled 2023-08-21: qty 20

## 2023-08-21 MED ORDER — PROPOFOL 10 MG/ML IV BOLUS
INTRAVENOUS | Status: DC | PRN
  Administered 2023-08-21: 100 mg via INTRAVENOUS

## 2023-08-21 MED ORDER — GABAPENTIN 100 MG PO CAPS
100.0000 mg | ORAL_CAPSULE | Freq: Three times a day (TID) | ORAL | Status: DC
  Administered 2023-08-21 – 2023-08-25 (×12): 100 mg via ORAL
  Filled 2023-08-21 (×12): qty 1

## 2023-08-21 MED ORDER — ONDANSETRON HCL 4 MG/2ML IJ SOLN
INTRAMUSCULAR | Status: DC | PRN
  Administered 2023-08-21: 4 mg via INTRAVENOUS

## 2023-08-21 MED ORDER — CHLORHEXIDINE GLUCONATE 0.12 % MT SOLN
OROMUCOSAL | Status: AC
  Administered 2023-08-21: 15 mL via OROMUCOSAL
  Filled 2023-08-21: qty 15

## 2023-08-21 MED ORDER — FENTANYL CITRATE (PF) 250 MCG/5ML IJ SOLN
INTRAMUSCULAR | Status: DC | PRN
  Administered 2023-08-21: 100 ug via INTRAVENOUS

## 2023-08-21 MED ORDER — NALOXONE HCL 0.4 MG/ML IJ SOLN
INTRAMUSCULAR | Status: AC
  Filled 2023-08-21: qty 1

## 2023-08-21 MED ORDER — ROCURONIUM BROMIDE 100 MG/10ML IV SOLN
INTRAVENOUS | Status: DC | PRN
  Administered 2023-08-21: 50 mg via INTRAVENOUS

## 2023-08-21 MED ORDER — CEFAZOLIN SODIUM 1 G IJ SOLR
INTRAMUSCULAR | Status: AC
Start: 2023-08-21 — End: 2023-08-21
  Filled 2023-08-21: qty 10

## 2023-08-21 MED ORDER — 0.9 % SODIUM CHLORIDE (POUR BTL) OPTIME
TOPICAL | Status: DC | PRN
Start: 2023-08-21 — End: 2023-08-21
  Administered 2023-08-21: 1000 mL

## 2023-08-21 MED ORDER — SODIUM CHLORIDE 0.9 % IV BOLUS
1000.0000 mL | Freq: Once | INTRAVENOUS | Status: AC | PRN
  Administered 2023-08-21: 1000 mL via INTRAVENOUS

## 2023-08-21 MED ORDER — CEFAZOLIN SODIUM 1 G IJ SOLR
INTRAMUSCULAR | Status: AC
  Filled 2023-08-21: qty 10

## 2023-08-21 MED ORDER — ORAL CARE MOUTH RINSE: 15.0000 mL | Freq: Once | OROMUCOSAL | Status: AC

## 2023-08-21 MED ORDER — PROPOFOL 10 MG/ML IV BOLUS
INTRAVENOUS | Status: AC
  Filled 2023-08-21: qty 20

## 2023-08-21 MED ORDER — PHENYLEPHRINE HCL-NACL 20-0.9 MG/250ML-% IV SOLN
INTRAVENOUS | Status: DC | PRN
  Administered 2023-08-21: 50 ug/min via INTRAVENOUS

## 2023-08-21 MED ORDER — EPHEDRINE SULFATE-NACL 50-0.9 MG/10ML-% IV SOSY
PREFILLED_SYRINGE | INTRAVENOUS | Status: DC | PRN
  Administered 2023-08-21: 5 mg via INTRAVENOUS
  Administered 2023-08-21 (×2): 10 mg via INTRAVENOUS

## 2023-08-21 MED ORDER — DROPERIDOL 2.5 MG/ML IJ SOLN: 0.6250 mg | Freq: Once | INTRAMUSCULAR | Status: DC | PRN

## 2023-08-21 MED ORDER — SODIUM CHLORIDE 0.9 % IV BOLUS
500.0000 mL | Freq: Once | INTRAVENOUS | Status: AC
  Administered 2023-08-21: 500 mL via INTRAVENOUS

## 2023-08-21 MED ORDER — IPRATROPIUM-ALBUTEROL 0.5-2.5 (3) MG/3ML IN SOLN
3.0000 mL | Freq: Four times a day (QID) | RESPIRATORY_TRACT | Status: DC
  Administered 2023-08-21 – 2023-08-22 (×3): 3 mL via RESPIRATORY_TRACT
  Filled 2023-08-21 (×4): qty 3

## 2023-08-21 MED ORDER — VASOPRESSIN 20 UNIT/ML IV SOLN
INTRAVENOUS | Status: DC | PRN
Start: 2023-08-21 — End: 2023-08-21
  Administered 2023-08-21 (×2): 1 [IU] via INTRAVENOUS

## 2023-08-21 MED ORDER — ORAL CARE MOUTH RINSE: 15.0000 mL | OROMUCOSAL | Status: DC | PRN

## 2023-08-21 MED ORDER — ACETAMINOPHEN 10 MG/ML IV SOLN
1000.0000 mg | Freq: Once | INTRAVENOUS | Status: DC | PRN
Start: 2023-08-21 — End: 2023-08-21

## 2023-08-21 MED ORDER — PHENYLEPHRINE 80 MCG/ML (10ML) SYRINGE FOR IV PUSH (FOR BLOOD PRESSURE SUPPORT)
PREFILLED_SYRINGE | INTRAVENOUS | Status: DC | PRN
  Administered 2023-08-21 (×5): 160 ug via INTRAVENOUS

## 2023-08-21 MED ORDER — CEFAZOLIN SODIUM-DEXTROSE 2-3 GM-%(50ML) IV SOLR
INTRAVENOUS | Status: DC | PRN
  Administered 2023-08-21: 2 g via INTRAVENOUS

## 2023-08-21 MED ORDER — DEXAMETHASONE SODIUM PHOSPHATE 10 MG/ML IJ SOLN
INTRAMUSCULAR | Status: DC | PRN
  Administered 2023-08-21: 5 mg via INTRAVENOUS

## 2023-08-21 MED ORDER — FENTANYL CITRATE (PF) 250 MCG/5ML IJ SOLN
INTRAMUSCULAR | Status: AC
  Filled 2023-08-21: qty 5

## 2023-08-21 MED ORDER — EPHEDRINE 5 MG/ML INJ
INTRAVENOUS | Status: AC
Start: 2023-08-21 — End: 2023-08-21
  Filled 2023-08-21: qty 5

## 2023-08-21 MED ORDER — FENTANYL CITRATE (PF) 100 MCG/2ML IJ SOLN: 25.0000 ug | INTRAMUSCULAR | Status: DC | PRN

## 2023-08-21 MED ORDER — LIDOCAINE 2% (20 MG/ML) 5 ML SYRINGE
INTRAMUSCULAR | Status: DC | PRN
  Administered 2023-08-21: 60 mg via INTRAVENOUS

## 2023-08-21 MED ORDER — ALBUMIN HUMAN 5 % IV SOLN: INTRAVENOUS | Status: DC | PRN

## 2023-08-21 MED ORDER — TRANEXAMIC ACID 1000 MG/10ML IV SOLN
2000.0000 mg | INTRAVENOUS | Status: AC
  Filled 2023-08-21: qty 20

## 2023-08-21 MED ORDER — SODIUM CHLORIDE 0.9 % IV SOLN
10.0000 mL/h | Freq: Once | INTRAVENOUS | Status: AC
  Administered 2023-08-21: 10 mL/h via INTRAVENOUS

## 2023-08-21 MED ORDER — CHLORHEXIDINE GLUCONATE 0.12 % MT SOLN: 15.0000 mL | Freq: Once | OROMUCOSAL | Status: AC

## 2023-08-21 SURGICAL SUPPLY — 31 items
BAG DECANTER FOR FLEXI CONT (MISCELLANEOUS) IMPLANT
BINDER BREAST XLRG (GAUZE/BANDAGES/DRESSINGS) IMPLANT
BNDG GAUZE DERMACEA FLUFF 4 (GAUZE/BANDAGES/DRESSINGS) IMPLANT
CANISTER SUCTION 3000ML PPV (SUCTIONS) ×1 IMPLANT
COVER SURGICAL LIGHT HANDLE (MISCELLANEOUS) ×1 IMPLANT
DERMABOND ADVANCED .7 DNX12 (GAUZE/BANDAGES/DRESSINGS) IMPLANT
DRAIN CHANNEL 19F RND (DRAIN) IMPLANT
DRAPE LAPAROSCOPIC ABDOMINAL (DRAPES) IMPLANT
DRAPE LAPAROTOMY 100X72 PEDS (DRAPES) IMPLANT
ELECTRODE REM PT RTRN 9FT ADLT (ELECTROSURGICAL) ×1 IMPLANT
EVACUATOR SILICONE 100CC (DRAIN) IMPLANT
GAUZE PAD ABD 8X10 STRL (GAUZE/BANDAGES/DRESSINGS) IMPLANT
GAUZE SPONGE 4X4 12PLY STRL (GAUZE/BANDAGES/DRESSINGS) IMPLANT
GAUZE SPONGE 4X4 12PLY STRL LF (GAUZE/BANDAGES/DRESSINGS) IMPLANT
GLOVE BIO SURGEON STRL SZ7.5 (GLOVE) ×1 IMPLANT
GOWN STRL REUS W/ TWL LRG LVL3 (GOWN DISPOSABLE) ×2 IMPLANT
KIT BASIN OR (CUSTOM PROCEDURE TRAY) ×1 IMPLANT
KIT TURNOVER KIT B (KITS) ×1 IMPLANT
NDL HYPO 25GX1X1/2 BEV (NEEDLE) IMPLANT
NEEDLE HYPO 25GX1X1/2 BEV (NEEDLE) IMPLANT
NS IRRIG 1000ML POUR BTL (IV SOLUTION) ×1 IMPLANT
PACK GENERAL/GYN (CUSTOM PROCEDURE TRAY) ×1 IMPLANT
PAD ARMBOARD POSITIONER FOAM (MISCELLANEOUS) ×1 IMPLANT
SPONGE T-LAP 18X18 ~~LOC~~+RFID (SPONGE) IMPLANT
SUT ETHILON 2 0 FS 18 (SUTURE) IMPLANT
SUT MNCRL AB 4-0 PS2 18 (SUTURE) IMPLANT
SWAB COLLECTION DEVICE MRSA (MISCELLANEOUS) IMPLANT
SWAB CULTURE ESWAB REG 1ML (MISCELLANEOUS) IMPLANT
SYR CONTROL 10ML LL (SYRINGE) IMPLANT
TOWEL GREEN STERILE (TOWEL DISPOSABLE) ×1 IMPLANT
TOWEL GREEN STERILE FF (TOWEL DISPOSABLE) ×1 IMPLANT

## 2023-08-21 NOTE — Progress Notes (Signed)
 IV infusing into R chest PAC which was accessed by the IV team.  Latest BP is 85/35 (52), sat 97 and pulse 69, pt resting. Awaiting transfer to 4E08.

## 2023-08-21 NOTE — Anesthesia Procedure Notes (Signed)
 Procedure Name: Intubation Date/Time: 08/21/2023 4:45 PM  Performed by: Atanacio Arland HERO, CRNAPre-anesthesia Checklist: Patient identified, Emergency Drugs available, Suction available and Patient being monitored Patient Re-evaluated:Patient Re-evaluated prior to induction Oxygen Delivery Method: Circle System Utilized Preoxygenation: Pre-oxygenation with 100% oxygen Induction Type: IV induction Ventilation: Mask ventilation without difficulty Laryngoscope Size: Mac and 4 Grade View: Grade I Tube type: Oral Tube size: 7.0 mm Number of attempts: 1 Airway Equipment and Method: Stylet Placement Confirmation: ETT inserted through vocal cords under direct vision, positive ETCO2 and breath sounds checked- equal and bilateral Secured at: 21 cm Tube secured with: Tape Dental Injury: Teeth and Oropharynx as per pre-operative assessment

## 2023-08-21 NOTE — Progress Notes (Signed)
   08/21/23 0818  Vitals  Temp 97.6 F (36.4 C)  Temp Source Oral  BP 94/76  MAP (mmHg) 80  BP Location Left Leg  BP Method Automatic  Patient Position (if appropriate) Lying  Oxygen Therapy  SpO2 96 %  MEWS Score  MEWS Temp 0  MEWS Systolic 1  MEWS Pulse 0  MEWS RR 0  MEWS LOC 0  MEWS Score 1  MEWS Score Color Green  Provider Notification  Provider Name/Title Dr. Curvin  Date Provider Notified 08/21/23  Time Provider Notified 0815  Method of Notification Page  Notification Reason Change in status (low BP, nausea, dizziness, left breast pain 10/10)  Provider response Other (Comment);See new orders  Date of Provider Response 08/21/23  Time of Provider Response 0825    Patient complaining of dizziness nausea and left breast pain 10/10. Recheck of BP 93/57. Bruising and Swelling noted to upper left breast. Dr. Curvin Notified 500 ml bolus given.

## 2023-08-21 NOTE — Consult Note (Signed)
 NAME:  Donna [name], MRN:  [id number], DOB:  [birth year], LOS: 0 ADMISSION DATE:  08/16/2023, CONSULTATION DATE:  8/6 REFERRING MD:  toth, CHIEF COMPLAINT:  hemorrhagic shock   History of Present Illness:  73 year old female w/ bilateral breast cancer. Presented to [facility] for definitive surgery on 8/1 for bilateral mastectomy.   Procedure was completed on 8/1; which consisted of left mastectomy w/ deep axillary LN bx & targeted node dissection also right mastectomy w/ deep axillary node Bx (1 node sent from left and 3 nodes from right).  EBL recorded as minimal Was supposed to be discharged 8/2 but had 8/10 pain, felt weak and dizzy. Hgb 11.7->12.1 8/3 pain a little better, still feeling weak. Looks like plavix  was resumed at this point. From 8/3 to 8/5 no sig events 8/6 reporting 10/10 chest wall pain, having nausea and feeling dizzy. SBP 90s around 0800 @1149  RR RN called to room. Pt in emotional distress, yelling out at staff, was more confused, incontinent and SBP in 70s. There was a notable chest wall hematoma w/ saturated right surgical dressing. Still had JP.  received 1 liter crystalloid, CNC sent stat hgb down to 5.7. PLTs stable.  Evaluated by surgical team around noon who felt she would need surgical repair. Orders were placed to transfuse. Started on first unit shortly before heading to OR. Went to OR for surgical repair at 1500.  Received total of 2 units prior to surgical team assuming care.   In OR: underwent successful evacuation of Chest wall hematoma w/ irrigation and closure. On exploration a pumping vessel was identified in the muscle of chest wall this was sutured and then surrounding tissue cauterized. Wound packed w/ TXA sponges x , new drains placed  EBL estimated at 1 liter In addition to 2 units PRBC prior to OR 500 ml albumin  and supported on pressors.  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 Hgb stable 8/3 dc again delayed w/ plan to go home 8/4 to 8/5 no data 8/6 new onset pain hypotension. Hgb <6. To OR. Got 2 units blood. Large right CW hematoma evacuated. Hemostasis achieved. To ICU post op   Interim History / Subjective:  Awake. No distress pain controlled   Objective    Blood pressure (!) 115/39, pulse 70, temperature (!) 97.2 F (36.2 C), temperature source Oral, resp. rate 14, height 5' 9 (1.753 m), weight 70.3 kg, SpO2 97%.        Intake/Output Summary (Last 24 hours) at 08/21/2023 1834 Last data filed at 08/21/2023 1830 Gross per 24 hour  Intake 1862 ml  Output 1300 ml  Net 562 ml   Filed Weights   08/16/23 9347 08/21/23 1521  Weight: 70.3 kg 70.3 kg    Examination: General: 73 year old female she is laying in bed, no acute distress HENT: MM dry no JVD Lungs: coarse scattered rhonchi on [place] no accessory use Chest wall bilateral chest wall dressings intact the new right chest wall dressing CD&I. JP w/ about 50cc output thus far Cardiovascular: RRR  Abdomen: soft not tender Extremities: warm brisk CR  Neuro: oriented  GU:  dtv   Resolved problem list   Assessment and Plan  Hemorrhagic shock 2/2 right chest wall hematoma s/p bilateral mastectomy 8/1 Now s/p surgical repair  on 8/6 Received 2 units PRBC in OR: pumping vessel sutured, site cauterized, soaked w/ TXA and then closed after irrigation  Plan Close obs of JP drains and surgical sites F/u CBC, ionized Ca, chemistry  Hold plavix  and LMWH Serial cbc Further PRBC x-fusion for hgb < 8 per surgical team request. Has another unit ordered  Could consider Desmopressin (DDAVP)  0.3-0.4 ug/kg, infuse over 20-30 minutes if still active bleeding Prob little advantage to platelet xfusion  Hold home antihypertensives Provide post operative pain control Avoid fever and also hypothermia  Keep euglycemic   Tobacco abuse No PFTs Plan Add BDs Add IS  Early ambulation once hemodynamics are confirmed  H/o HTN and HLD Plan Holding home meds  H/o anxiety/depression  Plan  Resume home meds when able  H/o stroke Plan Hold plavix  given recent bleeding    Best Practice (right click and Reselect all SmartList Selections daily)   Diet/type: NPO DVT prophylaxis SCD Pressure ulcer(s): N/A GI prophylaxis: PPI Lines: N/A Foley:  N/A Code Status:  full code Last date of multidisciplinary goals of care discussion [pending] daughter updated via phone   Labs   CBC: Recent Labs  Lab 08/17/23 1623 08/21/23 1210  WBC 12.1* 14.5*  NEUTROABS  --  9.7*  HGB 12.8 5.7*  HCT 40.2 18.7*  MCV 102.0* 105.6*  PLT 209 210    Basic Metabolic Panel: Recent Labs  Lab 08/17/23 1623  NA 138  K 3.7  CL 109  CO2 22  GLUCOSE 111*  BUN 21  CREATININE 1.12*  CALCIUM  9.1   GFR: Estimated Creatinine Clearance: 48.1 mL/min (A) (by C-G formula based on SCr of 1.12 mg/dL (H)). Recent Labs  Lab 08/17/23 1623 08/21/23 1210  WBC 12.1* 14.5*    Liver Function Tests: No results for input(s): AST, ALT, ALKPHOS, BILITOT, PROT, ALBUMIN  in the last 168 hours. No results for input(s): LIPASE, AMYLASE in the last 168 hours. No results for input(s): AMMONIA in the last 168 hours.  ABG    Component Value Date/Time   TCO2 22 05/25/2020 1349     Coagulation Profile: No results for input(s): INR, PROTIME in the last 168 hours.  Cardiac Enzymes: No results for input(s): CKTOTAL, CKMB, CKMBINDEX, TROPONINI in the last 168 hours.  HbA1C: Hgb A1c MFr Bld  Date/Time Value Ref Range Status  05/26/2020 03:19 AM 5.0 4.8 - 5.6 % Final     Comment:    (NOTE) Pre diabetes:          5.7%-6.4%  Diabetes:              >6.4%  Glycemic control for   <7.0% adults with diabetes     CBG: Recent Labs  Lab 08/21/23 1134  GLUCAP 194*    Review of Systems:   Pain currently under control   Past Medical History:  She,  has a past medical history of Allergy, Anxiety, Breast cancer (HCC), Depression, Hyperlipidemia, Hypertension, and Stroke (HCC) (05/2020).   Surgical History:   Past Surgical History:  Procedure Laterality Date   AXILLARY LYMPH NODE DISSECTION Left 08/16/2023   Procedure: REDGIE HARD;  Surgeon: Curvin Deward MOULD, MD;  Location: [facility] OR;  Service: General;  Laterality: Left;   BREAST BIOPSY Left 12/21/2022   US  LT BREAST BX W LOC DEV 1ST LESION IMG BX SPEC  US  GUIDE 12/21/2022 [facility]   BREAST BIOPSY Left 12/21/2022   US  LT BREAST BX W LOC DEV EA ADD LESION IMG BX SPEC US  GUIDE 12/21/2022 [facility]   BREAST BIOPSY Right 12/26/2022   US  RT BREAST BX W LOC DEV 1ST LESION IMG BX SPEC US  GUIDE 12/26/2022 [facility]   BREAST BIOPSY Right 12/26/2022   US  RT BREAST BX W LOC DEV EA ADD LESION IMG BX SPEC US  GUIDE 12/26/2022 [facility]   BREAST BIOPSY Right 12/26/2022   MM RT BREAST BX W LOC DEV 1ST LESION IMAGE BX SPEC STEREO GUIDE 12/26/2022 [facility]   BREAST BIOPSY Left 08/15/2023   US  LT RADIOACTIVE SEED LOC 08/15/2023 [facility]   DENTAL SURGERY     IR IMAGING GUIDED PORT INSERTION  01/22/2023   MASTECTOMY W/ SENTINEL NODE BIOPSY Bilateral 08/16/2023   Procedure: MASTECTOMY WITH SENTINEL LYMPH NODE BIOPSY;  Surgeon: Curvin Deward MOULD, MD;  Location: [facility] OR;  Service: General;  Laterality: Bilateral;  GEN w/PEC BLOCK LEFT MASTY WITH SENTINEL NODE AND TARGETED NODE DISSECTION RIGHT MASTECTOMY WITH SENTINEL NODE   NSVD     x2   TUBAL LIGATION  1976     Social History:   reports that she has been smoking cigarettes. She has never used smokeless tobacco. She  reports current alcohol use. She reports that she does not use drugs.   Family History:  Her family history includes Breast cancer in her niece; Cancer (age of onset: 38 - 7) in her father; Colon cancer (age of onset: 39) in her brother; Colon cancer (age of onset: 16 - 65) in her mother; Colon polyps in her sister; Esophageal cancer (age of onset: 93 - 3) in her sister; Lung cancer (age of onset: 28 84 15) in her sister; Ovarian cancer (age of onset: 66) in her sister. There is no history of Rectal cancer, Stomach cancer, or Pancreatic cancer.   Allergies No Known Allergies   Home Medications  Prior to Admission medications   Medication Sig Start Date End Date Taking? Authorizing Provider  amLODipine  (NORVASC ) 10 MG tablet Take 1 tablet (10 mg total) by mouth daily. 08/01/23  Yes Gudena, Vinay, MD  busPIRone  (BUSPAR ) 5 MG tablet Take 5 mg by mouth 2 (two) times daily. 11/09/16  Yes [provider]  cyclobenzaprine (FLEXERIL) 5 MG tablet Take 5 mg by mouth 3 (three) times daily as needed for muscle spasms.   Yes [provider]  DULoxetine  (CYMBALTA ) 60 MG capsule Take 60 mg by mouth daily.   Yes [provider]  furosemide  (LASIX ) 20 MG tablet TAKE 1 TABLET(20 MG) BY MOUTH DAILY Patient taking differently: Take 20 mg by mouth daily as needed for edema. 04/17/23  Yes Gudena, Vinay, MD  gabapentin  (NEURONTIN ) 100 MG capsule Take 1 capsule (100 mg total) by mouth 2 (two) times daily as needed. 08/16/23 08/15/24 Yes Curvin Deward III, MD  LORazepam  (ATIVAN ) 0.5 MG tablet Take 1 tablet (0.5 mg total) by mouth at bedtime as needed for anxiety. 07/11/23  Yes Odean Potts, MD  losartan  (COZAAR ) 100 MG tablet Take 100 mg by mouth daily. 07/25/23  Yes [provider]  methocarbamol  (ROBAXIN ) 500 MG tablet Take 1 tablet (500 mg total) by mouth every 6 (six) hours as needed (use for muscle cramps/pain). 08/16/23  Yes Curvin Deward III, MD  montelukast  (SINGULAIR ) 10 MG tablet Take 10  mg by mouth at bedtime. 11/26/16  Yes [provider]  Multiple Vitamins-Calcium  (  ONE-A-DAY WOMENS FORMULA) TABS Take 1 tablet by mouth daily.   Yes [provider]  NIFEdipine  (PROCARDIA -XL/NIFEDICAL-XL) 30 MG 24 hr tablet Take 30 mg by mouth daily. 07/25/23  Yes [provider]  traZODone  (DESYREL ) 100 MG tablet Take 100 mg by mouth at bedtime as needed for sleep. 07/25/23  Yes [provider]  albuterol  (VENTOLIN  HFA) 108 (90 Base) MCG/ACT inhaler Inhale 2 puffs into the lungs every 4 (four) hours as needed for wheezing or shortness of breath.    [provider]  aspirin  EC 81 MG EC tablet Take 1 tablet (81 mg total) by mouth daily. Swallow whole. Patient not taking: Reported on 08/12/2023 05/31/20   Cresenzo, John V, MD  clopidogrel  (PLAVIX ) 75 MG tablet Take 1 tablet (75 mg total) by mouth daily. Patient not taking: Reported on 08/12/2023 05/31/20   Cresenzo, John V, MD  lidocaine -prilocaine  (EMLA ) cream Apply to affected area once 01/03/23   Gudena, Vinay, MD  nicotine  (NICODERM CQ  - DOSED IN MG/24 HR) 7 mg/24hr patch Place 1 patch (7 mg total) onto the skin daily as needed (Nicotine  cravings). Patient not taking: Reported on 08/12/2023 05/31/20   Cresenzo, John V, MD  ondansetron  (ZOFRAN ) 8 MG tablet Take 1 tablet (8 mg total) by mouth every 8 (eight) hours as needed for nausea or vomiting. Start on the third day after chemotherapy. Patient not taking: Reported on 08/12/2023 01/03/23   Gudena, Vinay, MD  oxyCODONE  (OXY IR/ROXICODONE ) 5 MG immediate release tablet Take 1 tablet (5 mg total) by mouth every 6 (six) hours as needed (for pain score of 1-4). 08/16/23   Curvin Deward MOULD, MD  prochlorperazine  (COMPAZINE ) 10 MG tablet Take 1 tablet (10 mg total) by mouth every 6 (six) hours as needed for nausea or vomiting. Patient not taking: Reported on 08/12/2023 01/03/23   Gudena, Vinay, MD  rosuvastatin  (CRESTOR ) 20 MG tablet Take 1 tablet (20 mg total) by mouth  daily. Patient not taking: Reported on 08/12/2023 05/31/20   Cresenzo, John V, MD     Critical care time: 32 min

## 2023-08-21 NOTE — Progress Notes (Signed)
 RBC transfusion started. Patient A&Ox4. BP 92/35. Rapid RN present. Charge RN present. R side gauze and bed pad saturated, dressing changed. Full bed change. Report given to short stay, transport staff here to take to OR. This RN and rapid RN went with transport.  Alexys Lobello L Dechelle Attaway, RN

## 2023-08-21 NOTE — Op Note (Signed)
 08/16/2023 - 08/21/2023  6:08 PM  PATIENT:  Donna [name]  73 y.o. female  PRE-OPERATIVE DIAGNOSIS:  chest wall hematoma  POST-OPERATIVE DIAGNOSIS:  chest wall hematoma  PROCEDURE:  Procedure(s) with comments: Evacuation chest wall hematoma with irrigation and closure  SURGEON:  Surgeons and Role:    DEWAINE Curvin Deward DOUGLAS, MD - Primary  PHYSICIAN ASSISTANT:   ASSISTANTS: none   ANESTHESIA:   general  EBL:  1000cc   BLOOD ADMINISTERED:none  DRAINS: (1) Blake drain(s) in the prepectoral space   LOCAL MEDICATIONS USED:  NONE  SPECIMEN:  No Specimen  DISPOSITION OF SPECIMEN:  N/A  COUNTS:  YES  TOURNIQUET:  * No tourniquets in log *  DICTATION: .Dragon Dictation  After informed consent was obtained the patient was brought to the operating room and placed in the supine position on the operating table.  After adequate induction of general anesthesia the patient's right chest wall was prepped with Betadine and draped in the usual sterile manner.  An appropriate timeout was performed.  The patient developed a hematoma after mastectomy.  She had 2 units of blood before arriving to the operating room.  The previous right mastectomy incision was reopened sharply with a 10 blade knife and the electrocautery.  A large amount of hematoma was evacuated.  I was able to identify a pumping vessel from the muscle of the chest wall.  This was controlled with a figure-of-eight 3-0 Vicryl stitch.  The rest of the raw surface was fulgurated with the electrocautery until it appeared to be completely hemostatic.  The wound was irrigated with copious amounts of saline.  The wound was then packed with a lap sponge moistened with TXA and left in the mastectomy cavity for approximately 10 minutes.  The sponge was then removed.  A new 19 French round Blake drain was placed through the old drain site in the right lateral chest wall.  The drain was curled along the chest wall.  The drain was anchored to the skin  with a 3-0 nylon stitch.  Next the superior and inferior skin flaps were grossly reapproximated with interrupted 3-0 Vicryl stitches.  The skin was then closed with a running 4-0 Monocryl subcuticular stitch.  Dermabond dressings and sterile drain dressings were applied.  The patient tolerated the procedure well.  At the end of the case all needle sponge and instrument counts were correct.  The patient was then awakened and taken to recovery in stable condition.  The blood loss from the operation including the large hematoma was approximately 1000 cc.  PLAN OF CARE: Admit to inpatient   PATIENT DISPOSITION:  PACU - guarded condition.   Delay start of Pharmacological VTE agent (>24hrs) due to surgical blood loss or risk of bleeding: yes

## 2023-08-21 NOTE — Progress Notes (Signed)
 5 Days Post-Op   Subjective/Chief Complaint: Complains of pain right chest wall   Objective: Vital signs in last 24 hours: Temp:  [97.6 F (36.4 C)-98.2 F (36.8 C)] 98 F (36.7 C) (08/06 1011) Pulse Rate:  [65-77] 72 (08/06 1202) Resp:  [18] 18 (08/06 1011) BP: (70-138)/(31-76) 93/37 (08/06 1202) SpO2:  [92 %-100 %] 94 % (08/06 1202) Last BM Date : 08/20/23  Intake/Output from previous day: 08/05 0701 - 08/06 0700 In: 480 [P.O.:480] Out: 315 [Drains:315] Intake/Output this shift: Total I/O In: -  Out: 125 [Drains:125]  General appearance: alert and cooperative Resp: clear to auscultation bilaterally Chest wall: hematoma on right Cardio: regular rate and rhythm and hypotensive GI: soft, non-tender; bowel sounds normal; no masses,  no organomegaly  Lab Results:  No results for input(s): WBC, HGB, HCT, PLT in the last 72 hours. BMET No results for input(s): NA, K, CL, CO2, GLUCOSE, BUN, CREATININE, CALCIUM  in the last 72 hours. PT/INR No results for input(s): LABPROT, INR in the last 72 hours. ABG No results for input(s): PHART, HCO3 in the last 72 hours.  Invalid input(s): PCO2, PO2  Studies/Results: No results found.  Anti-infectives: Anti-infectives (From admission, onward)    Start     Dose/Rate Route Frequency Ordered Stop   08/16/23 0700  ceFAZolin  (ANCEF ) IVPB 2g/100 mL premix        2 g 200 mL/hr over 30 Minutes Intravenous On call to O.R. 08/16/23 9353 08/16/23 0835       Assessment/Plan: s/p Procedure(s) with comments: MASTECTOMY WITH SENTINEL LYMPH NODE BIOPSY (Bilateral) - GEN w/PEC BLOCK LEFT MASTY WITH SENTINEL NODE AND TARGETED NODE DISSECTION RIGHT MASTECTOMY WITH SENTINEL NODE LYMPHADENECTOMY, AXILLARY (Left) npo Hematoma right chest wall. She will need to go back to OR this afternoon to evacuate. I have discussed this with her including risks and benefits and she understands and wishes to proceed. I  tried to contact the family but no answer Hold plavix   LOS: 0 days    [name] 08/21/2023

## 2023-08-21 NOTE — Progress Notes (Signed)
 Patient will be transferring to room 4E08 . Patient daughter  Nat Barnes notified of change in patients status and new room assignment.

## 2023-08-21 NOTE — Progress Notes (Signed)
 Critical value hgb 5.7 transfusion already ordered waiting on blood to be ready .

## 2023-08-21 NOTE — Anesthesia Preprocedure Evaluation (Addendum)
 Anesthesia Evaluation  Patient identified by MRN, date of birth, ID band Patient awake    Reviewed: Allergy & Precautions, H&P , NPO status , Patient's Chart, lab work & pertinent test results  History of Anesthesia Complications Negative for: history of anesthetic complications  Airway Mallampati: II  TM Distance: >3 FB Neck ROM: Full    Dental no notable dental hx. (+) Poor Dentition, Missing, Dental Advisory Given, Partial Lower   Pulmonary Current Smoker and Patient abstained from smoking.   Pulmonary exam normal breath sounds clear to auscultation       Cardiovascular Exercise Tolerance: Good hypertension, Pt. on medications Normal cardiovascular exam Rhythm:Regular Rate:Normal  Echo 5/25 1. Left ventricular ejection fraction, by estimation, is 55 to 60%. The  left ventricle has normal function. The left ventricle has no regional  wall motion abnormalities. Left ventricular diastolic parameters were  normal.   2. Right ventricular systolic function is normal. The right ventricular  size is mildly enlarged.   3. The mitral valve is normal in structure. No evidence of mitral valve  regurgitation. No evidence of mitral stenosis.   4. The aortic valve is normal in structure. Aortic valve regurgitation is  not visualized. No aortic stenosis is present.   5. The inferior vena cava is normal in size with greater than 50%  respiratory variability, suggesting right atrial pressure of 3 mmHg.     Neuro/Psych  PSYCHIATRIC DISORDERS Anxiety Depression    Residual weakness s LEFT side since 2022 CVA, Residual Symptoms negative neurological ROS  negative psych ROS   GI/Hepatic negative GI ROS, Neg liver ROS,,,  Endo/Other  negative endocrine ROS    Renal/GU negative Renal ROS  negative genitourinary   Musculoskeletal negative musculoskeletal ROS (+)    Abdominal   Peds negative pediatric ROS (+)  Hematology  (+)  Blood dyscrasia, anemia   Anesthesia Other Findings Hg 5.7 1pRBC hanging, second in room  Reproductive/Obstetrics negative OB ROS                              Anesthesia Physical Anesthesia Plan  ASA: 3  Anesthesia Plan: General   Post-op Pain Management:    Induction: Intravenous  PONV Risk Score and Plan: 3 and Ondansetron , Dexamethasone  and Midazolam   Airway Management Planned: Oral ETT and LMA  Additional Equipment: None  Intra-op Plan:   Post-operative Plan: Extubation in OR  Informed Consent: I have reviewed the patients History and Physical, chart, labs and discussed the procedure including the risks, benefits and alternatives for the proposed anesthesia with the patient or authorized representative who has indicated his/her understanding and acceptance.     Dental advisory given  Plan Discussed with: Anesthesiologist and CRNA  Anesthesia Plan Comments:          Anesthesia Quick Evaluation

## 2023-08-21 NOTE — Progress Notes (Signed)
 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  Given hemodynamic changes IntraOp, patient was referred to ICU for monitoring  Patient has normal vitals saturating 98% on 3 L of oxygen.  She is off of vasopressors.  Results showed non-anion gap metabolic acidosis, improved anemia with hemoglobin of 8, leukocytosis.  Status post PRBC transfusion.  eICU Interventions  Vasopressors as needed if she becomes hypotensive again  Continue analgesia as ordered  DVT prophylaxis with SCDs in the setting of active bleeding GI prophylaxis not indicated, home pantoprazole      Intervention Category Evaluation Type: New Patient Evaluation  Kaamil Morefield 08/21/2023, 8:37 PM

## 2023-08-21 NOTE — Significant Event (Addendum)
 Rapid Response Event Note   Reason for Call :  SBP 60s, actively bleeding from right JP drain  Initial Focused Assessment:  Patient recently arrived to 4E, right chest JP drain actively leaking and saturated bed pad when Rapid RN arrived. Pt hypotensive, endorses dizziness though states it's better after having BM. No clinical change from previous assessment today. Denies pain, chest palpitations/flutters, SOB. Skin warm/dry. NAD.  Right chest hematoma still present--superior/laterally slightly softer than earlier.   92/35 (52) HR 71 RR 20 O2 93% RA  Interventions/Plan of Care:  Blood bank called 1426: blood almost ready No new orders from MD 1st unit PRBC started  1500 OR staff arrived to take pt to peri-op  Event Summary:  MD Notified: Curvin by primary RN Call Time: 1426 Arrival Time: End Time:  Tonna Chiquita POUR, RN

## 2023-08-21 NOTE — Progress Notes (Addendum)
 BP 80/32 with weakness, R side drain gauze saturated, blood bank blood is not ready yet, will transfuse as soon as ready. Notified Rapid RN and Curvin MD notified. Awaiting blood to be ready from blood bank.   Zohair Epp L Ermine Spofford, RN

## 2023-08-21 NOTE — Significant Event (Addendum)
 Rapid Response Event Note   Reason for Call :  Pt screaming  Initial Focused Assessment:  Pt lying in bed, appears confused and in some emotional distress regarding current episode. Pt states I'm nauseas and don't feel good. Initially hard to examine due to commotion in room and get answers from patient. Denies pain, denies CP/flutters, palpitations, abd pain.  Per staff pt has been yelling out all morning but was able to walk to bathroom. At time of event pt yelling was louder and constant--staff then entered room to find pt had urinated on herself and appeared more confused. Rapid RN notified. SBP 70s.  Right chest hematoma.  Lungs clear. Skin warm and dry. No edema present.    71/56  (63) right leg HR 70s RR 26 O2 96 RA CBG 194  Interventions/Plan of Care:  1L NS bolus  Labs: CBC, trop, T&S EKG MD to bedside Transfer PCU: likely to OR later today Transfuse blood  109/46 (64)  HR 69 RR 16 O2 94% 2L  Event Summary:  MD Notified: P. Curvin MD Call Time: 1128 Arrival Time: 1131 End Time: 1225  Tonna Chiquita POUR, RN

## 2023-08-21 NOTE — Progress Notes (Addendum)
 Pt arrived to 4E from 6N. Charge RN from [employer] transferred with the patient, I assisted with vitals and applying monitor briefly assessing the pt. Pt AAOx4, Vitals at this time were stable, JP drain on right side had a dressing that was just changed prior to transport per 6N RN and it was saturated with blood, during bedside assessment/report 6N charge expressed the blood was needing to be hung, and the patient was scheduled to go to the OR at 1600. All of this information was reported to the primary RN after she finished lunch.   Payge Eppes C Warden, RN 08/21/2023 1340

## 2023-08-21 NOTE — Transfer of Care (Signed)
 Immediate Anesthesia Transfer of Care Note  Patient: Donna [name]  Procedure(s) Performed: IRRIGATION AND DEBRIDEMENT WOUND (Right)  Patient Location: PACU  Anesthesia Type:General  Level of Consciousness: drowsy  Airway & Oxygen Therapy: Patient Spontanous Breathing and Patient connected to face mask oxygen  Post-op Assessment: Report given to RN and Post -op Vital signs reviewed and stable  Post vital signs: Reviewed and stable  Last Vitals:  Vitals Value Taken Time  BP 109/49 08/21/23 18:24  Temp 97.8   Pulse 79 08/21/23 18:27  Resp 19 08/21/23 18:27  SpO2 96 08/21/23 18:24  Vitals shown include unfiled device data.  Last Pain:  Vitals:   08/21/23 1609  TempSrc:   PainSc: 0-No pain      Patients Stated Pain Goal: 3 (08/21/23 1609)  Complications: No notable events documented.

## 2023-08-22 ENCOUNTER — Inpatient Hospital Stay: Admitting: Hematology and Oncology

## 2023-08-22 ENCOUNTER — Encounter (HOSPITAL_COMMUNITY): Payer: Self-pay | Admitting: General Surgery

## 2023-08-22 ENCOUNTER — Inpatient Hospital Stay

## 2023-08-22 ENCOUNTER — Encounter: Payer: Self-pay | Admitting: *Deleted

## 2023-08-22 DIAGNOSIS — Z9013 Acquired absence of bilateral breasts and nipples: Secondary | ICD-10-CM | POA: Diagnosis not present

## 2023-08-22 DIAGNOSIS — Z170 Estrogen receptor positive status [ER+]: Secondary | ICD-10-CM

## 2023-08-22 DIAGNOSIS — D62 Acute posthemorrhagic anemia: Secondary | ICD-10-CM

## 2023-08-22 DIAGNOSIS — J9601 Acute respiratory failure with hypoxia: Secondary | ICD-10-CM | POA: Diagnosis not present

## 2023-08-22 LAB — COMPREHENSIVE METABOLIC PANEL WITH GFR
ALT: 13 U/L (ref 0–44)
AST: 29 U/L (ref 15–41)
Albumin: 2.3 g/dL — ABNORMAL LOW (ref 3.5–5.0)
Alkaline Phosphatase: 44 U/L (ref 38–126)
Anion gap: 13 (ref 5–15)
BUN: 18 mg/dL (ref 8–23)
CO2: 15 mmol/L — ABNORMAL LOW (ref 22–32)
Calcium: 8.5 mg/dL — ABNORMAL LOW (ref 8.9–10.3)
Chloride: 112 mmol/L — ABNORMAL HIGH (ref 98–111)
Creatinine, Ser: 0.87 mg/dL (ref 0.44–1.00)
GFR, Estimated: >60 mL/min (ref >60)
Glucose, Bld: 124 mg/dL — ABNORMAL HIGH (ref 70–99)
Potassium: 5.2 mmol/L — ABNORMAL HIGH (ref 3.5–5.1)
Sodium: 140 mmol/L (ref 135–145)
Total Bilirubin: 0.7 mg/dL (ref 0.0–1.2)
Total Protein: 4.2 g/dL — ABNORMAL LOW (ref 6.5–8.1)

## 2023-08-22 LAB — BASIC METABOLIC PANEL WITH GFR
Anion gap: 5 (ref 5–15)
BUN: 18 mg/dL (ref 8–23)
CO2: 22 mmol/L (ref 22–32)
Calcium: 8.8 mg/dL — ABNORMAL LOW (ref 8.9–10.3)
Chloride: 111 mmol/L (ref 98–111)
Creatinine, Ser: 1.05 mg/dL — ABNORMAL HIGH (ref 0.44–1.00)
GFR, Estimated: 57 mL/min — ABNORMAL LOW (ref >60)
Glucose, Bld: 143 mg/dL — ABNORMAL HIGH (ref 70–99)
Potassium: 4.1 mmol/L (ref 3.5–5.1)
Sodium: 138 mmol/L (ref 135–145)

## 2023-08-22 LAB — CBC
HCT: 20.6 % — ABNORMAL LOW (ref 36.0–46.0)
Hemoglobin: 6.7 g/dL — CL (ref 12.0–15.0)
MCH: 32.2 pg (ref 26.0–34.0)
MCHC: 32.5 g/dL (ref 30.0–36.0)
MCV: 99 fL (ref 80.0–100.0)
Platelets: 179 K/uL (ref 150–400)
RBC: 2.08 MIL/uL — ABNORMAL LOW (ref 3.87–5.11)
RDW: 15.4 % (ref 11.5–15.5)
WBC: 14.3 K/uL — ABNORMAL HIGH (ref 4.0–10.5)
nRBC: 0 % (ref 0.0–0.2)

## 2023-08-22 LAB — MAGNESIUM: Magnesium: 2 mg/dL (ref 1.7–2.4)

## 2023-08-22 LAB — PREPARE RBC (CROSSMATCH)

## 2023-08-22 MED ORDER — SODIUM CHLORIDE 0.9% FLUSH: 10.0000 mL | INTRAVENOUS | Status: DC | PRN

## 2023-08-22 MED ORDER — SODIUM CHLORIDE 0.9% IV SOLUTION: Freq: Once | INTRAVENOUS | Status: AC

## 2023-08-22 MED ORDER — IPRATROPIUM-ALBUTEROL 0.5-2.5 (3) MG/3ML IN SOLN
3.0000 mL | Freq: Two times a day (BID) | RESPIRATORY_TRACT | Status: DC
  Administered 2023-08-22: 3 mL via RESPIRATORY_TRACT
  Filled 2023-08-22 (×2): qty 3

## 2023-08-22 MED ORDER — SODIUM CHLORIDE 0.9 % IV SOLN: INTRAVENOUS | Status: AC

## 2023-08-22 MED ORDER — SODIUM CHLORIDE 0.9% FLUSH
10.0000 mL | Freq: Two times a day (BID) | INTRAVENOUS | Status: DC
  Administered 2023-08-22 – 2023-08-25 (×6): 10 mL

## 2023-08-22 NOTE — Progress Notes (Signed)
 1 unit of PRBC complete, no adverse reaction.Call bell within reach

## 2023-08-22 NOTE — Progress Notes (Addendum)
 NAME:  Donna [name], MRN:  [id number], DOB:  01-21-1950, LOS: 1 ADMISSION DATE:  08/16/2023, CONSULTATION DATE:  8/6 REFERRING MD:  toth, CHIEF COMPLAINT:  hemorrhagic shock   History of Present Illness:  73 year old female w/ bilateral breast cancer. Presented to [facility] for definitive surgery on 8/1 for bilateral mastectomy.   Procedure was completed on 8/1; which consisted of left mastectomy w/ deep axillary LN bx & targeted node dissection also right mastectomy w/ deep axillary node Bx (1 node sent from left and 3 nodes from right).  EBL recorded as minimal Was supposed to be discharged 8/2 but had 8/10 pain, felt weak and dizzy. Hgb 11.7->12.1 8/3 pain a little better, still feeling weak. Looks like plavix  was resumed at this point. From 8/3 to 8/5 no sig events 8/6 reporting 10/10 chest wall pain, having nausea and feeling dizzy. SBP 90s around 0800 @1149  RR RN called to room. Pt in emotional distress, yelling out at staff, was more confused, incontinent and SBP in 70s. There was a notable chest wall hematoma w/ saturated right surgical dressing. Still had JP.  received 1 liter crystalloid, CNC sent stat hgb down to 5.7. PLTs stable.  Evaluated by surgical team around noon who felt she would need surgical repair. Orders were placed to transfuse. Started on first unit shortly before heading to OR. Went to OR for surgical repair at 1500.  Received total of 2 units prior to surgical team assuming care.   In OR: underwent successful evacuation of Chest wall hematoma w/ irrigation and closure. On exploration a pumping vessel was identified in the muscle of chest wall this was sutured and then surrounding tissue cauterized. Wound packed w/ TXA sponges x , new drains placed  EBL estimated at 1 liter In addition to 2 units PRBC prior to OR 500 ml albumin  and supported on pressors.  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 Hgb stable 8/3 dc again delayed w/ plan to go home 8/4 to 8/5 no data 8/6 new onset pain hypotension. Hgb <6. To OR. Got 2 units blood. Large right CW hematoma evacuated. Hemostasis achieved. To ICU post op   Interim History / Subjective:  NAEO  Hgb stable this morning  Objective    Blood pressure (!) 116/48, pulse 71, temperature 98.5 F (36.9 C), temperature source Oral, resp. rate 14, height 5' 9 (1.753 m), weight 82 kg, SpO2 99%.        Intake/Output Summary (Last 24 hours) at 08/22/2023 0855 Last data filed at 08/22/2023 0600 Gross per 24 hour  Intake 1622 ml  Output 2160 ml  Net -538 ml   Filed Weights   08/16/23 9347 08/21/23 1521 08/22/23 0530  Weight: 70.3 kg 70.3 kg 82 kg    Examination: General: very pleasant chronically ill older adult F NAD  HENT: NCAT pink mm  Lungs: even unlabored on RA  Chest: red watery drain output  Cardiovascular: rr cap refill brisk  Abdomen: soft  Extremities: no obvious acute joint deformity  Neuro: AAOx4  GU: defer   Resolved problem list  Hem shock   Assessment and Plan  Breast ca s/p bilateral mastectomy  R chest wall hematoma s/p repair  ABLA, stable  Plan -post op per CCS -follow CBC and trensfust PRN -d/w CCS 8/7 -- stable to txf out of ICU  Leukocytosis -likely reactive -trend WBC and fever curve   Tobacco abuse -encourage cessation -nicotine  patch  -BDs  HTN -holding antihypertensives for now (recent hem shock), add back as appropriate   Hx anxiety, depression  -cont home meds   H/o stroke -hold antiplt    Pccm will sign off please let us  know if we can be of further  assistance or it pt clinical status changes   Best Practice (right click and Reselect all SmartList Selections daily)   Diet/type: Regular consistency (see orders) DVT prophylaxis SCD Pressure ulcer(s): N/A GI prophylaxis: PPI Lines: N/A Foley:  N/A Code Status:  full code Last date of multidisciplinary goals of care discussion [pending] pt updated 8/7  Labs   CBC: Recent Labs  Lab 08/17/23 1623 08/21/23 1210 08/21/23 1816 08/21/23 1847  WBC 12.1* 14.5*  --  17.3*  NEUTROABS  --  9.7*  --   --   HGB 12.8 5.7* 7.1* 8.0*  HCT 40.2 18.7* 21.0* 25.2*  MCV 102.0* 105.6*  --  102.0*  PLT 209 210  --  165    Basic Metabolic Panel: Recent Labs  Lab 08/17/23 1623 08/21/23 1816 08/21/23 1847  NA 138 142 142  K 3.7 4.2 4.2  CL 109  --  114*  CO2 22  --  19*  GLUCOSE 111*  --  155*  BUN 21  --  18  CREATININE 1.12*  --  0.83  CALCIUM  9.1  --  7.9*   GFR: Estimated Creatinine Clearance: 71.2 mL/min (by C-G formula based on SCr of 0.83 mg/dL). Recent Labs  Lab 08/17/23 1623 08/21/23 1210 08/21/23 1847  WBC 12.1* 14.5* 17.3*    Liver Function Tests: No results for input(s): AST, ALT, ALKPHOS, BILITOT, PROT, ALBUMIN  in the last 168 hours. No results for input(s): LIPASE, AMYLASE in the last 168 hours. No results for input(s): AMMONIA in the last 168 hours.  ABG    Component Value Date/Time   HCO3 19.5 (L) 08/21/2023 1816   TCO2 21 (L) 08/21/2023 1816   ACIDBASEDEF 9.0 (H) 08/21/2023 1816   O2SAT 44 08/21/2023 1816     Coagulation Profile: No results for input(s): INR, PROTIME in the last 168 hours.  Cardiac Enzymes: No results for input(s): CKTOTAL, CKMB, CKMBINDEX, TROPONINI in the last 168 hours.  HbA1C: Hgb A1c MFr Bld  Date/Time Value Ref Range Status  05/26/2020 03:19 AM 5.0 4.8 - 5.6 % Final    Comment:    (NOTE) Pre diabetes:          5.7%-6.4%  Diabetes:              >6.4%  Glycemic control for   <7.0% adults  with diabetes     CBG: Recent Labs  Lab 08/21/23 1134  GLUCAP 194*    CCT n/a  Mod mdm    Ronnald Gave MSN, AGACNP-BC [facility] Pulmonary/[facility] Care Medicine Amion for pager 08/22/2023, 8:55 AM

## 2023-08-22 NOTE — Plan of Care (Signed)
  Problem: Education: Goal: Knowledge of General Education information will improve Description: Including pain rating scale, medication(s)/side effects and non-pharmacologic comfort measures Outcome: Progressing   Problem: Activity: Goal: Risk for activity intolerance will decrease Outcome: Progressing   Problem: Coping: Goal: Level of anxiety will decrease Outcome: Progressing   Problem: Elimination: Goal: Will not experience complications related to urinary retention Outcome: Progressing   

## 2023-08-22 NOTE — Progress Notes (Signed)
 1 Day Post-Op   Subjective/Chief Complaint: Feels much better today   Objective: Vital signs in last 24 hours: Temp:  [97.1 F (36.2 C)-98.5 F (36.9 C)] 98.5 F (36.9 C) (08/07 0824) Pulse Rate:  [67-77] 71 (08/07 0615) Resp:  [9-26] 14 (08/07 0615) BP: (61-143)/(31-58) 116/48 (08/07 0615) SpO2:  [91 %-100 %] 99 % (08/07 0615) Weight:  [70.3 kg-82 kg] 82 kg (08/07 0530) Last BM Date : 08/21/23  Intake/Output from previous day: 08/06 0701 - 08/07 0700 In: 1622 [I.V.:800; Blood:302; IV Piggyback:520] Out: 2230 [Urine:650; Drains:580; Blood:1000] Intake/Output this shift: No intake/output data recorded.  General appearance: alert and cooperative Resp: clear to auscultation bilaterally Chest wall: skin flaps look good. Drains output red but watery Cardio: regular rate and rhythm GI: soft, non-tender; bowel sounds normal; no masses,  no organomegaly  Lab Results:  Recent Labs    08/21/23 1210 08/21/23 1816 08/21/23 1847  WBC 14.5*  --  17.3*  HGB 5.7* 7.1* 8.0*  HCT 18.7* 21.0* 25.2*  PLT 210  --  165   BMET Recent Labs    08/21/23 1816 08/21/23 1847  NA 142 142  K 4.2 4.2  CL  --  114*  CO2  --  19*  GLUCOSE  --  155*  BUN  --  18  CREATININE  --  0.83  CALCIUM   --  7.9*   PT/INR No results for input(s): LABPROT, INR in the last 72 hours. ABG Recent Labs    08/21/23 1816  HCO3 19.5*    Studies/Results: No results found.  Anti-infectives: Anti-infectives (From admission, onward)    Start     Dose/Rate Route Frequency Ordered Stop   08/16/23 0700  ceFAZolin  (ANCEF ) IVPB 2g/100 mL premix        2 g 200 mL/hr over 30 Minutes Intravenous On call to O.R. 08/16/23 9353 08/16/23 0835       Assessment/Plan: s/p Procedure(s) with comments: IRRIGATION AND DEBRIDEMENT WOUND (Right) - WOUND WASHOUT AND CLOSURE Advance diet Monitor hg and drain output Hold plavix  Physical therapy eval Transfer to 6N  LOS: 1 day    Deward Null  III 08/22/2023

## 2023-08-22 NOTE — Anesthesia Postprocedure Evaluation (Signed)
 Anesthesia Post Note  Patient: Donna [name]  Procedure(s) Performed: IRRIGATION AND DEBRIDEMENT WOUND (Right)     Patient location during evaluation: PACU Anesthesia Type: General Level of consciousness: awake and alert Pain management: pain level controlled Vital Signs Assessment: post-procedure vital signs reviewed and stable Respiratory status: spontaneous breathing, nonlabored ventilation, respiratory function stable and patient connected to nasal cannula oxygen Cardiovascular status: blood pressure returned to baseline and stable Postop Assessment: no apparent nausea or vomiting Anesthetic complications: no   No notable events documented.  Last Vitals:  Vitals:   08/22/23 0603 08/22/23 0615  BP:  (!) 116/48  Pulse: 72 71  Resp: 10 14  Temp:    SpO2: 100% 99%    Last Pain:  Vitals:   08/22/23 0631  TempSrc:   PainSc: 2                  Epifanio Lamar BRAVO

## 2023-08-23 LAB — BPAM RBC
Blood Product Expiration Date: 202508272359
Blood Product Expiration Date: 202508272359
Blood Product Expiration Date: 202508302359
Blood Product Unit Number: 202508292359
ISSUE DATE / TIME: 202508061447
ISSUE DATE / TIME: 202508061536
ISSUE DATE / TIME: 202508072210
PRODUCT CODE: 202508071806
PRODUCT CODE: 202508302359
Unit Type and Rh: 202508292359
Unit Type and Rh: 5100
Unit Type and Rh: 5100
Unit Type and Rh: 5100
Unit Type and Rh: 5100
Unit Type and Rh: 5100

## 2023-08-23 LAB — TYPE AND SCREEN
ABO/RH(D): O POS
Antibody Screen: NEGATIVE
Unit division: 0
Unit division: 0
Unit division: 0
Unit division: 0

## 2023-08-23 LAB — CBC
HCT: 27.7 % — ABNORMAL LOW (ref 36.0–46.0)
Hemoglobin: 9.3 g/dL — ABNORMAL LOW (ref 12.0–15.0)
MCH: 32 pg (ref 26.0–34.0)
MCHC: 33.6 g/dL (ref 30.0–36.0)
MCV: 95.2 fL (ref 80.0–100.0)
Platelets: 179 K/uL (ref 150–400)
RBC: 2.91 MIL/uL — ABNORMAL LOW (ref 3.87–5.11)
RDW: 15.8 % — ABNORMAL HIGH (ref 11.5–15.5)
WBC: 13.5 K/uL — ABNORMAL HIGH (ref 4.0–10.5)
nRBC: 0.1 % (ref 0.0–0.2)

## 2023-08-23 MED ORDER — CLOPIDOGREL BISULFATE 75 MG PO TABS
75.0000 mg | ORAL_TABLET | Freq: Every day | ORAL | Status: DC
  Administered 2023-08-23 – 2023-08-25 (×3): 75 mg via ORAL
  Filled 2023-08-23 (×3): qty 1

## 2023-08-23 NOTE — Progress Notes (Signed)
 Physical Therapy Evaluation Patient Details Name: Donna [name] MRN: [id number] DOB: Jul 20, 1950 Today's Date: 08/23/2023  History of Present Illness  Pt is a 73 y/o female s/p bilateral mastectomies wtih sentinel lymph node biopsies secondary to breast CA. Of note, pt required evacuation chest wall hematoma with irrigation and closure in OR on 8/6. 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  Clinical Impression  Pt presented supine in bed with HOB elevated, awake and willing to participate in therapy session. Prior to admission, pt reported that she ambulated independently or with her cane inside her home and used a rollator for community level distances. Pt also stated she was previously independent with ADLs, family assists with driving and IADLs. Pt lives alone in a single level home with with 2 STE. At the time of evaluation, pt able to complete bed mobility at a mod ind level, transfers with supervision and ambulated in the hallway with use of RW and CGA for safety. Pt somewhat limited secondary to bouts of nausea throughout. PT provided pt with an emesis bag and ginger ale at end of session. Based on pt's current functional mobility status, PT recommending pt d/c home with HHPT services. Pt would continue to benefit from skilled physical therapy services at this time while admitted and after d/c to address the below listed limitations in order to improve overall safety and independence with functional mobility.       If plan is discharge home, recommend the following: A little help with bathing/dressing/bathroom;Assistance with cooking/housework;Assist for transportation;Help with stairs or ramp for entrance   Can travel by private vehicle        Equipment Recommendations None recommended by PT  Recommendations for Other Services       Functional Status Assessment Patient has had a recent decline in their functional  status and demonstrates the ability to make significant improvements in function in a reasonable and predictable amount of time.     Precautions / Restrictions Precautions Precautions: Fall Recall of Precautions/Restrictions: Intact Precaution/Restrictions Comments: JP drains x2 Restrictions Weight Bearing Restrictions Per Provider Order: No      Mobility  Bed Mobility Overal bed mobility: Modified Independent                  Transfers Overall transfer level: Needs assistance Equipment used: Rolling walker (2 wheels) Transfers: Sit to/from Stand Sit to Stand: Supervision           General transfer comment: pt with slow transitional movement but steady overall, supervision for safety    Ambulation/Gait Ambulation/Gait assistance: Contact guard assist Gait Distance (Feet): 100 Feet Assistive device: Rolling walker (2 wheels) Gait Pattern/deviations: Step-through pattern Gait velocity: reduced     General Gait Details: Pt ambulated with a reciprocal gait pattern, equal weight shift, good foot clearence, and good proximity to RW. She navigated room/hallway well. No LOB. Limited secondary to nausea  Stairs            Wheelchair Mobility     Tilt Bed    Modified Rankin (Stroke Patients Only)       Balance Overall balance assessment: Needs assistance Sitting-balance support: Feet supported Sitting balance-Leahy Scale: Good     Standing balance support: During functional activity Standing balance-Leahy Scale: Fair                               Pertinent Vitals/Pain  Pain Assessment Pain Assessment: Faces Faces Pain Scale: Hurts a little bit Pain Location: chest Pain Descriptors / Indicators: Sore Pain Intervention(s): Monitored during session, Repositioned    Home Living Family/patient expects to be discharged to:: Private residence Living Arrangements: Alone Available Help at Discharge: Family;Friend(s);Available  PRN/intermittently Type of Home: House Home Access: Stairs to enter Entrance Stairs-Rails: None Entrance Stairs-Number of Steps: 2   Home Layout: One level Home Equipment: Patent examiner (4 wheels);Other (comment);Shower seat (hurri-cane)      Prior Function Prior Level of Function : Independent/Modified Independent             Mobility Comments: Ambulates without AD inside the house, occasionally uses furniture for support and with rollator in community. Denies falls in the past 72mo. ADLs Comments: Indep with ADLs. Manages medications and household - cooking/cleaning. Pt doesn't drive relies on family for transportations and some IADLs.     Extremity/Trunk Assessment   Upper Extremity Assessment Upper Extremity Assessment: Overall WFL for tasks assessed    Lower Extremity Assessment Lower Extremity Assessment: Overall WFL for tasks assessed    Cervical / Trunk Assessment Cervical / Trunk Assessment: Normal  Communication   Communication Communication: No apparent difficulties    Cognition Arousal: Alert Behavior During Therapy: WFL for tasks assessed/performed   PT - Cognitive impairments: No apparent impairments                       PT - Cognition Comments: Pt AOx4 Following commands: Intact       Cueing Cueing Techniques: Verbal cues     General Comments      Exercises     Assessment/Plan    PT Assessment Patient needs continued PT services  PT Problem List Decreased mobility;Decreased coordination;Decreased knowledge of precautions       PT Treatment Interventions DME instruction;Gait training;Stair training;Functional mobility training;Therapeutic activities;Therapeutic exercise;Neuromuscular re-education;Balance training;Patient/family education    PT Goals (Current goals can be found in the Care Plan section)  Acute Rehab PT Goals Patient Stated Goal: Return Home PT Goal Formulation: With patient Time For Goal Achievement:  09/06/23 Potential to Achieve Goals: Good    Frequency Min 3X/week     Co-evaluation               AM-PAC PT 6 Clicks Mobility  Outcome Measure Help needed turning from your back to your side while in a flat bed without using bedrails?: None Help needed moving from lying on your back to sitting on the side of a flat bed without using bedrails?: None Help needed moving to and from a bed to a chair (including a wheelchair)?: None Help needed standing up from a chair using your arms (e.g., wheelchair or bedside chair)?: None Help needed to walk in hospital room?: A Little Help needed climbing 3-5 steps with a railing? : A Little 6 Click Score: 22    End of Session   Activity Tolerance: Patient tolerated treatment well Patient left: in bed;with call bell/phone within reach;Other (comment) (sitting EOB) Nurse Communication: Mobility status PT Visit Diagnosis: Other abnormalities of gait and mobility (R26.89)    Time: 9174-9140 PT Time Calculation (min) (ACUTE ONLY): 34 min   Charges:   PT Evaluation $PT Eval Moderate Complexity: 1 Mod PT Treatments $Gait Training: 8-22 mins PT General Charges $$ ACUTE PT VISIT: 1 Visit         Delon DELENA KLEIN, DPT  Acute Rehabilitation Services Office [phone]   Delon HERO Phuc Kluttz 08/23/2023,  9:55 AM

## 2023-08-23 NOTE — TOC Progression Note (Signed)
 Transition of Care [facility]) - Progression Note    Patient Details  Name: Donna [name] MRN: [id number] Date of Birth: 01-26-1950  Transition [facility] Care [facility]) CM/SW Contact  Justina Delcia Czar, RN Phone Number: [phone] 08/23/2023, 2:39 PM  Clinical Narrative:     Spoke to pt and states she was independent at home. Gave permission to speak to dtr, Nat. Pt 3n1 bedside commode is in the room delivered by [employer]. Pt may need oxygen for home. . States she use Medicaid for transportation.   Explained PCP office can arrange [employer] PCS services for home. Pt has requested information on Meals on Wheels.   Has [employer] in the past for [facility]. Requested [employer] if she need HH.   Expected Discharge Plan: Home w Home Health Services Barriers to Discharge: Continued Medical Work up               Expected Discharge Plan and Services   Discharge Planning Services: CM Consult Post Acute Care Choice: Home Health Living arrangements for the past 2 months: Single Family Home                 DME Arranged: Bedside commode DME Agency: [employer] Date DME Agency Contacted: 08/19/23 Time DME Agency Contacted: 1034 Representative spoke with at DME Agency: [facility] HH Arranged: NA           Social Drivers of Health (SDOH) Interventions SDOH Screenings   Food Insecurity: No Food Insecurity (08/16/2023)  Housing: Low Risk  (08/16/2023)  Transportation Needs: No Transportation Needs (08/16/2023)  Utilities: Not At Risk (08/16/2023)  Financial Resource Strain: Not at Risk (04/04/2022)   Received from [facility]  Physical Activity: Not on File (05/04/2021)   Received from [facility]  Social Connections: Moderately Integrated (08/16/2023)  Stress: Not on File (05/04/2021)   Received from [facility]  Tobacco Use: High Risk (08/21/2023)    Readmission Risk Interventions     No data to display

## 2023-08-23 NOTE — Progress Notes (Signed)
 2 Days Post-Op   Subjective/Chief Complaint: Complains of some pain this am   Objective: Vital signs in last 24 hours: Temp:  [98 F (36.7 C)-98.7 F (37.1 C)] 98.7 F (37.1 C) (08/08 0745) Pulse Rate:  [65-83] 73 (08/08 0745) Resp:  [13-24] 16 (08/08 0745) BP: (92-141)/(35-111) 114/52 (08/08 0745) SpO2:  [95 %-100 %] 96 % (08/08 0745) Last BM Date : 08/22/23  Intake/Output from previous day: 08/07 0701 - 08/08 0700 In: 1675 [P.O.:925; Blood:750] Out: 835 [Urine:300; Drains:535] Intake/Output this shift: No intake/output data recorded.  General appearance: alert and cooperative Resp: clear to auscultation bilaterally Chest wall: skin flaps look good. No hematoma Cardio: regular rate and rhythm GI: soft, non-tender; bowel sounds normal; no masses,  no organomegaly  Lab Results:  Recent Labs    08/22/23 1555 08/23/23 0416  WBC 14.3* 13.5*  HGB 6.7* 9.3*  HCT 20.6* 27.7*  PLT 179 179   BMET Recent Labs    08/22/23 1204 08/22/23 1555  NA 140 138  K 5.2* 4.1  CL 112* 111  CO2 15* 22  GLUCOSE 124* 143*  BUN 18 18  CREATININE 0.87 1.05*  CALCIUM  8.5* 8.8*   PT/INR No results for input(s): LABPROT, INR in the last 72 hours. ABG Recent Labs    08/21/23 1816  HCO3 19.5*    Studies/Results: No results found.  Anti-infectives: Anti-infectives (From admission, onward)    Start     Dose/Rate Route Frequency Ordered Stop   08/16/23 0700  ceFAZolin  (ANCEF ) IVPB 2g/100 mL premix        2 g 200 mL/hr over 30 Minutes Intravenous On call to O.R. 08/16/23 [id number] 08/16/23 0835       Assessment/Plan: s/p Procedure(s) with comments: IRRIGATION AND DEBRIDEMENT WOUND (Right) - WOUND WASHOUT AND CLOSURE Advance diet Monitor drain output PT eval Restart plavix  today  LOS: 2 days    [name] 08/23/2023

## 2023-08-23 NOTE — Progress Notes (Signed)
 Mobility Specialist Progress Note:    08/23/23 1440  Mobility  Activity Ambulated with assistance (In hallway/ to BR)  Level of Assistance Contact guard assist, steadying assist  Assistive Device Front wheel walker  Distance Ambulated (ft) 270 ft  Activity Response Tolerated well  Mobility Referral Yes  Mobility visit 1 Mobility  Mobility Specialist Start Time (ACUTE ONLY) 1404  Mobility Specialist Stop Time (ACUTE ONLY) 1436  Mobility Specialist Time Calculation (min) (ACUTE ONLY) 32 min   Received pt in bed and agreeable to mobility. No physical assistance needed. C/o feeling unsteady, otherwise tolerated well. Returned to room without fault. Pt requested to use the BR. Pt left EOB with personal belongings and call light within reach. All needs met.  Donna [name] Mobility Specialist  Please contact via [employer] or  Rehab Office [phone]

## 2023-08-24 MED ORDER — ENSURE PLUS HIGH PROTEIN PO LIQD
237.0000 mL | Freq: Two times a day (BID) | ORAL | Status: DC
  Administered 2023-08-24 – 2023-08-25 (×2): 237 mL via ORAL

## 2023-08-24 NOTE — Plan of Care (Signed)
  Problem: Education: Goal: Knowledge of General Education information will improve Description Including pain rating scale, medication(s)/side effects and non-pharmacologic comfort measures Outcome: Progressing   Problem: Clinical Measurements: Goal: Will remain free from infection Outcome: Progressing Goal: Diagnostic test results will improve Outcome: Progressing   Problem: Coping: Goal: Level of anxiety will decrease Outcome: Progressing   Problem: Safety: Goal: Ability to remain free from injury will improve Outcome: Progressing

## 2023-08-24 NOTE — Progress Notes (Signed)
 Mobility Specialist Progress Note   08/24/23 0925  Mobility  Activity Ambulated with assistance  Level of Assistance Contact guard assist, steadying assist  Assistive Device Front wheel walker  Distance Ambulated (ft) 15 ft  Range of Motion/Exercises Active;All extremities  Activity Response Tolerated well  Mobility Specialist Start Time (ACUTE ONLY) [id number]  Mobility Specialist Stop Time (ACUTE ONLY) 0931  Mobility Specialist Time Calculation (min) (ACUTE ONLY) 6 min   Patient received supine in bed, reluctant to participate despite encouragement. Stated she just received pain medication and it was finally starting to kick in. Just want to relax. Requested assistance to restroom but deferred extending ambulation into hallway. Completed bed mobility with minimal HHA, more so assisting from sidelying to sitting. Stood and ambulated to bathroom with RW and min guard for safety. Returned to bed without incident. Was left in supine with all needs met, call bell in reach.   [place] Zaydee Aina, BS EXP Mobility Specialist Please contact via [employer] or Rehab office at [phone]

## 2023-08-24 NOTE — Progress Notes (Signed)
 3 Days Post-Op   Subjective/Chief Complaint: Complains of some pain   Objective: Vital signs in last 24 hours: Temp:  [98.1 F (36.7 C)] 98.1 F (36.7 C) (08/09 0417) Pulse Rate:  [66-67] 66 (08/09 0417) Resp:  [16-18] 18 (08/09 0417) BP: (111-139)/(54-62) 124/62 (08/09 0417) SpO2:  [96 %-98 %] 98 % (08/09 0417) Last BM Date : 08/22/23  Intake/Output from previous day: 08/08 0701 - 08/09 0700 In: 100 [P.O.:100] Out: 590 [Urine:300; Drains:290] Intake/Output this shift: No intake/output data recorded.  General appearance: alert and cooperative Resp: rhonchi bilaterally Chest wall: skin flaps look good. No hematoma Cardio: regular rate and rhythm GI: soft, non-tender; bowel sounds normal; no masses,  no organomegaly  Lab Results:  Recent Labs    08/22/23 1555 08/23/23 0416  WBC 14.3* 13.5*  HGB 6.7* 9.3*  HCT 20.6* 27.7*  PLT 179 179   BMET Recent Labs    08/22/23 1204 08/22/23 1555  NA 140 138  K 5.2* 4.1  CL 112* 111  CO2 15* 22  GLUCOSE 124* 143*  BUN 18 18  CREATININE 0.87 1.05*  CALCIUM  8.5* 8.8*   PT/INR No results for input(s): LABPROT, INR in the last 72 hours. ABG Recent Labs    08/21/23 1816  HCO3 19.5*    Studies/Results: No results found.  Anti-infectives: Anti-infectives (From admission, onward)    Start     Dose/Rate Route Frequency Ordered Stop   08/16/23 0700  ceFAZolin  (ANCEF ) IVPB 2g/100 mL premix        2 g 200 mL/hr over 30 Minutes Intravenous On call to O.R. 08/16/23 [id number] 08/16/23 0835       Assessment/Plan: s/p Procedure(s) with comments: IRRIGATION AND DEBRIDEMENT WOUND (Right) - WOUND WASHOUT AND CLOSURE Advance diet Add ensure Order Home Health PT Continue plavix  Hopefully ready for d/c tomorrow  LOS: 3 days    Donna [name] 08/24/2023

## 2023-08-24 NOTE — Plan of Care (Signed)
  Problem: Education: Goal: Knowledge of General Education information will improve Description: Including pain rating scale, medication(s)/side effects and non-pharmacologic comfort measures Outcome: Progressing   Problem: Health Behavior/Discharge Planning: Goal: Ability to manage health-related needs will improve Outcome: Progressing   Problem: Activity: Goal: Risk for activity intolerance will decrease Outcome: Progressing   Problem: Pain Managment: Goal: General experience of comfort will improve and/or be controlled Outcome: Progressing   Problem: Safety: Goal: Ability to remain free from injury will improve Outcome: Progressing

## 2023-08-25 MED ORDER — HEPARIN SOD (PORK) LOCK FLUSH 100 UNIT/ML IV SOLN
500.0000 [IU] | INTRAVENOUS | Status: AC | PRN
  Administered 2023-08-25: 500 [IU]

## 2023-08-25 NOTE — Progress Notes (Signed)
 4 Days Post-Op   Subjective/Chief Complaint: Complains of soreness   Objective: Vital signs in last 24 hours: Temp:  [97.4 F (36.3 C)-98.4 F (36.9 C)] 98.4 F (36.9 C) (08/10 0426) Pulse Rate:  [67-73] 67 (08/10 0426) Resp:  [16-20] 16 (08/10 0426) BP: (118-157)/(54-62) 133/54 (08/10 0426) SpO2:  [96 %-100 %] 99 % (08/10 0426) Last BM Date : 08/23/23  Intake/Output from previous day: 08/09 0701 - 08/10 0700 In: 850 [P.O.:840; I.V.:10] Out: 700 [Urine:450; Drains:250] Intake/Output this shift: No intake/output data recorded.  General appearance: alert and cooperative Resp: clear to auscultation bilaterally Chest wall: skin flaps look good. No hematoma Cardio: regular rate and rhythm GI: soft, non-tender; bowel sounds normal; no masses,  no organomegaly  Lab Results:  Recent Labs    08/22/23 1555 08/23/23 0416  WBC 14.3* 13.5*  HGB 6.7* 9.3*  HCT 20.6* 27.7*  PLT 179 179   BMET Recent Labs    08/22/23 1204 08/22/23 1555  NA 140 138  K 5.2* 4.1  CL 112* 111  CO2 15* 22  GLUCOSE 124* 143*  BUN 18 18  CREATININE 0.87 1.05*  CALCIUM  8.5* 8.8*   PT/INR No results for input(s): LABPROT, INR in the last 72 hours. ABG No results for input(s): PHART, HCO3 in the last 72 hours.  Invalid input(s): PCO2, PO2  Studies/Results: No results found.  Anti-infectives: Anti-infectives (From admission, onward)    Start     Dose/Rate Route Frequency Ordered Stop   08/16/23 0700  ceFAZolin  (ANCEF ) IVPB 2g/100 mL premix        2 g 200 mL/hr over 30 Minutes Intravenous On call to O.R. 08/16/23 [id number] 08/16/23 0835       Assessment/Plan: s/p Procedure(s) with comments: IRRIGATION AND DEBRIDEMENT WOUND (Right) - WOUND WASHOUT AND CLOSURE Advance diet Once home health is arranged pt may be d/c home  LOS: 4 days    [name] 08/25/2023

## 2023-08-25 NOTE — TOC Transition Note (Signed)
 Transition of Care [facility]) - Discharge Note   Patient Details  Name: Donna [name] MRN: [id number] Date of Birth: 08-Apr-1950  Transition of Care [facility]) CM/SW Contact:  Robynn Eileen Hoose, RN Phone Number: 08/25/2023, 12:48 PM   Clinical Narrative:   Patient is being discharged today. Attempt made to find choice home health agency. Unable to locate [facility] services due to patient insurance out of network for [facility] agency.    Final next level of care: Home/Self Care Barriers to Discharge: No Barriers Identified   Patient Goals and CMS Choice Patient states their goals for this hospitalization and ongoing recovery are:: wants to remain independent CMS Medicare.gov Compare Post Acute Care list provided to:: Patient Choice offered to / list presented to : Patient      Discharge Placement                       Discharge Plan and Services Additional resources added to the After Visit Summary for     Discharge Planning Services: CM Consult Post Acute Care Choice: Home Health          DME Arranged: Bedside commode DME Agency: [employer] Date DME Agency Contacted: 08/19/23 Time DME Agency Contacted: 1034 Representative spoke with at DME Agency: [facility] CHEADLE Arranged: NA          Social Drivers of Health (SDOH) Interventions SDOH Screenings   Food Insecurity: No Food Insecurity (08/16/2023)  Housing: Low Risk  (08/16/2023)  Transportation Needs: No Transportation Needs (08/16/2023)  Utilities: Not At Risk (08/16/2023)  Financial Resource Strain: Not at Risk (04/04/2022)   Received from [facility]  Physical Activity: Not on File (05/04/2021)   Received from [facility]  Social Connections: Moderately Integrated (08/16/2023)  Stress: Not on File (05/04/2021)   Received from [facility]  Tobacco Use: High Risk (08/21/2023)     Readmission Risk Interventions     No data to display

## 2023-08-25 NOTE — Plan of Care (Signed)
  Problem: Education: Goal: Knowledge of General Education information will improve Description: Including pain rating scale, medication(s)/side effects and non-pharmacologic comfort measures Outcome: Progressing   Problem: Health Behavior/Discharge Planning: Goal: Ability to manage health-related needs will improve Outcome: Progressing   Problem: Nutrition: Goal: Adequate nutrition will be maintained Outcome: Progressing   Problem: Pain Managment: Goal: General experience of comfort will improve and/or be controlled Outcome: Progressing   Problem: Safety: Goal: Ability to remain free from injury will improve Outcome: Progressing

## 2023-08-26 LAB — SURGICAL PATHOLOGY

## 2023-08-29 ENCOUNTER — Encounter: Payer: Self-pay | Admitting: *Deleted

## 2023-09-02 NOTE — Discharge Summary (Signed)
 Physician Discharge Summary  Patient ID: Donna [name] MRN: [id number] DOB/AGE: 72/11/1950 72 y.o.  Admit date: 08/16/2023 Discharge date: 09/02/2023  Admission Diagnoses:  Discharge Diagnoses:  Principal Problem:   S/P mastectomy, bilateral Active Problems:   Bilateral breast cancer (HCC)   ABLA (acute blood loss anemia)   Discharged Condition: good  Hospital Course: The patient underwent bilateral mastectomies.  She initially tolerated the surgery well.  She had some issues with pain control and mobility over the first few days.  She then developed a hematoma of the right chest wall.  She went back to the operating room where this was evacuated.  It then took her several days to improve to the point where she could be mobile and take care of herself at home.  Once she reached this point then she was discharged home.  Consults: None  Significant Diagnostic Studies: None  Treatments: surgery: As above  Discharge Exam: Blood pressure (!) 157/56, pulse 62, temperature 97.9 F (36.6 C), temperature source Oral, resp. rate 17, height 5' 9 (1.753 m), weight 82 kg, SpO2 99%. Chest wall: Skin flaps look good  Disposition: Discharge disposition: 01-Home or Self Care       Discharge Instructions     Ambulatory referral to Physical Therapy   Complete by: As directed    Call MD for:  difficulty breathing, headache or visual disturbances   Complete by: As directed    Call MD for:  extreme fatigue   Complete by: As directed    Call MD for:  hives   Complete by: As directed    Call MD for:  persistant dizziness or light-headedness   Complete by: As directed    Call MD for:  persistant nausea and vomiting   Complete by: As directed    Call MD for:  redness, tenderness, or signs of infection (pain, swelling, redness, odor or green/yellow discharge around incision site)   Complete by: As directed    Call MD for:  severe uncontrolled pain   Complete by: As directed    Call MD  for:  temperature >100.4   Complete by: As directed    Diet - low sodium heart healthy   Complete by: As directed    Discharge instructions   Complete by: As directed    Sponge bathe while drains are in. No overhead activity. Empty drain, record output, recharge bulb daily   Increase activity slowly   Complete by: As directed    No wound care   Complete by: As directed       Allergies as of 08/25/2023   No Known Allergies      Medication List     TAKE these medications    albuterol  108 (90 Base) MCG/ACT inhaler Commonly known as: VENTOLIN  HFA Inhale 2 puffs into the lungs every 4 (four) hours as needed for wheezing or shortness of breath.   amLODipine  10 MG tablet Commonly known as: NORVASC  Take 1 tablet (10 mg total) by mouth daily.   aspirin  EC 81 MG tablet Take 1 tablet (81 mg total) by mouth daily. Swallow whole.   busPIRone  5 MG tablet Commonly known as: BUSPAR  Take 5 mg by mouth 2 (two) times daily.   clopidogrel  75 MG tablet Commonly known as: PLAVIX  Take 1 tablet (75 mg total) by mouth daily.   cyclobenzaprine 5 MG tablet Commonly known as: FLEXERIL Take 5 mg by mouth 3 (three) times daily as needed for muscle spasms.   DULoxetine  60 MG capsule  Commonly known as: CYMBALTA  Take 60 mg by mouth daily.   furosemide  20 MG tablet Commonly known as: LASIX  TAKE 1 TABLET(20 MG) BY MOUTH DAILY What changed: See the new instructions.   gabapentin  100 MG capsule Commonly known as: Neurontin  Take 1 capsule (100 mg total) by mouth 2 (two) times daily as needed.   lidocaine -prilocaine  cream Commonly known as: EMLA  Apply to affected area once   LORazepam  0.5 MG tablet Commonly known as: ATIVAN  Take 1 tablet (0.5 mg total) by mouth at bedtime as needed for anxiety.   losartan  100 MG tablet Commonly known as: COZAAR  Take 100 mg by mouth daily.   methocarbamol  500 MG tablet Commonly known as: ROBAXIN  Take 1 tablet (500 mg total) by mouth every 6 (six)  hours as needed (use for muscle cramps/pain).   montelukast  10 MG tablet Commonly known as: SINGULAIR  Take 10 mg by mouth at bedtime.   nicotine  7 mg/24hr patch Commonly known as: NICODERM CQ  - dosed in mg/24 hr Place 1 patch (7 mg total) onto the skin daily as needed (Nicotine  cravings).   NIFEdipine  30 MG 24 hr tablet Commonly known as: PROCARDIA -XL/NIFEDICAL-XL Take 30 mg by mouth daily.   ondansetron  8 MG tablet Commonly known as: Zofran  Take 1 tablet (8 mg total) by mouth every 8 (eight) hours as needed for nausea or vomiting. Start on the third day after chemotherapy.   One-A-Day Womens Formula Tabs Take 1 tablet by mouth daily.   oxyCODONE  5 MG immediate release tablet Commonly known as: Oxy IR/ROXICODONE  Take 1 tablet (5 mg total) by mouth every 6 (six) hours as needed (for pain score of 1-4).   prochlorperazine  10 MG tablet Commonly known as: COMPAZINE  Take 1 tablet (10 mg total) by mouth every 6 (six) hours as needed for nausea or vomiting.   rosuvastatin  20 MG tablet Commonly known as: CRESTOR  Take 1 tablet (20 mg total) by mouth daily.   traZODone  100 MG tablet Commonly known as: DESYREL  Take 100 mg by mouth at bedtime as needed for sleep.        Follow-up Information     Curvin Mt III, MD Follow up in 2 week(s).   Specialty: General Surgery Contact information: [address] [place] [address] [place] [address] [phone]         [facility] Follow up.   Specialty: Rehabilitation Contact information: [address] [place]  [address] [phone]        [name], FNP Follow up.   Specialty: Family Medicine Why: please contact to schedule hospital follow up appt to see PCP in 7-14 days. Contact information: [address] [place] [address] [phone]                 Signed: Mt Curvin III 09/02/2023, 11:32 AM

## 2023-09-11 NOTE — Assessment & Plan Note (Addendum)
 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%, HER2 3+ positive   Treatment plan: Neoadjuvant TCHP x 5 cycles (stopped early for toxicities) followed by HP maintenance versus Kadcyla maintenance 09/04/2023: Left mastectomy: Residual IDC 2.2 cm with DCIS, margins negative for invasive cancer, DCIS: Present at anterior-inferior margin, 4/6 sentinel lymph nodes positive, ER 100%, PR 60%, Ki67 10%, HER2 1+; right mastectomy: 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 I discussed the margins as well as lymph node surgeries. We also discussed the final staging along with previously performed ER/PR and HER-2/neu testing.  Treatment plan: Recommend Kadcyla maintenance Adjuvant radiation therapy Adjuvant antiestrogen therapy and neratinib    She had a prior history of stroke.

## 2023-09-12 ENCOUNTER — Other Ambulatory Visit: Payer: Self-pay | Admitting: *Deleted

## 2023-09-12 ENCOUNTER — Inpatient Hospital Stay: Attending: Hematology and Oncology

## 2023-09-12 ENCOUNTER — Inpatient Hospital Stay

## 2023-09-12 ENCOUNTER — Telehealth: Payer: Self-pay | Admitting: *Deleted

## 2023-09-12 ENCOUNTER — Inpatient Hospital Stay: Admitting: Hematology and Oncology

## 2023-09-12 VITALS — Wt 158.0 lb

## 2023-09-12 DIAGNOSIS — Z5112 Encounter for antineoplastic immunotherapy: Secondary | ICD-10-CM | POA: Diagnosis present

## 2023-09-12 DIAGNOSIS — Z8673 Personal history of transient ischemic attack (TIA), and cerebral infarction without residual deficits: Secondary | ICD-10-CM | POA: Insufficient documentation

## 2023-09-12 DIAGNOSIS — Z171 Estrogen receptor negative status [ER-]: Secondary | ICD-10-CM

## 2023-09-12 DIAGNOSIS — G4700 Insomnia, unspecified: Secondary | ICD-10-CM | POA: Insufficient documentation

## 2023-09-12 DIAGNOSIS — Z5181 Encounter for therapeutic drug level monitoring: Secondary | ICD-10-CM

## 2023-09-12 DIAGNOSIS — C50812 Malignant neoplasm of overlapping sites of left female breast: Secondary | ICD-10-CM | POA: Insufficient documentation

## 2023-09-12 DIAGNOSIS — Z9013 Acquired absence of bilateral breasts and nipples: Secondary | ICD-10-CM | POA: Diagnosis not present

## 2023-09-12 DIAGNOSIS — C50811 Malignant neoplasm of overlapping sites of right female breast: Secondary | ICD-10-CM

## 2023-09-12 DIAGNOSIS — C50412 Malignant neoplasm of upper-outer quadrant of left female breast: Secondary | ICD-10-CM

## 2023-09-12 LAB — CBC WITH DIFFERENTIAL (CANCER CENTER ONLY)
Abs Immature Granulocytes: 0.05 K/uL (ref 0.00–0.07)
Basophils Absolute: 0 K/uL (ref 0.0–0.1)
Basophils Relative: 0 %
Eosinophils Absolute: 0.1 K/uL (ref 0.0–0.5)
Eosinophils Relative: 1 %
HCT: 35.8 % — ABNORMAL LOW (ref 36.0–46.0)
Hemoglobin: 12.1 g/dL (ref 12.0–15.0)
Immature Granulocytes: 0 %
Lymphocytes Relative: 23 %
Lymphs Abs: 2.9 K/uL (ref 0.7–4.0)
MCH: 33 pg (ref 26.0–34.0)
MCHC: 33.8 g/dL (ref 30.0–36.0)
MCV: 97.5 fL (ref 80.0–100.0)
Monocytes Absolute: 0.8 K/uL (ref 0.1–1.0)
Monocytes Relative: 6 %
Neutro Abs: 9 K/uL — ABNORMAL HIGH (ref 1.7–7.7)
Neutrophils Relative %: 70 %
Platelet Count: 256 K/uL (ref 150–400)
RBC: 3.67 MIL/uL — ABNORMAL LOW (ref 3.87–5.11)
RDW: 14.3 % (ref 11.5–15.5)
WBC Count: 12.9 K/uL — ABNORMAL HIGH (ref 4.0–10.5)
nRBC: 0 % (ref 0.0–0.2)

## 2023-09-12 LAB — CMP (CANCER CENTER ONLY)
ALT: 9 U/L (ref 0–44)
AST: 13 U/L — ABNORMAL LOW (ref 15–41)
Albumin: 3.4 g/dL — ABNORMAL LOW (ref 3.5–5.0)
Alkaline Phosphatase: 101 U/L (ref 38–126)
Anion gap: 5 (ref 5–15)
BUN: 7 mg/dL — ABNORMAL LOW (ref 8–23)
CO2: 27 mmol/L (ref 22–32)
Calcium: 9.4 mg/dL (ref 8.9–10.3)
Chloride: 108 mmol/L (ref 98–111)
Creatinine: 0.63 mg/dL (ref 0.44–1.00)
GFR, Estimated: >60 mL/min (ref >60)
Glucose, Bld: 95 mg/dL (ref 70–99)
Potassium: 3.7 mmol/L (ref 3.5–5.1)
Sodium: 140 mmol/L (ref 135–145)
Total Bilirubin: 0.3 mg/dL (ref 0.0–1.2)
Total Protein: 5.9 g/dL — ABNORMAL LOW (ref 6.5–8.1)

## 2023-09-12 MED ORDER — ZOLPIDEM TARTRATE 5 MG PO TABS: 5.0000 mg | ORAL_TABLET | Freq: Every evening | ORAL | 1 refills | Status: DC | PRN

## 2023-09-12 MED ORDER — ACETAMINOPHEN 325 MG PO TABS
650.0000 mg | ORAL_TABLET | Freq: Once | ORAL | Status: AC
  Administered 2023-09-12: 650 mg via ORAL
  Filled 2023-09-12: qty 2

## 2023-09-12 MED ORDER — SODIUM CHLORIDE 0.9 % IV SOLN: INTRAVENOUS | Status: DC

## 2023-09-12 MED ORDER — TRASTUZUMAB-ANNS CHEMO 150 MG IV SOLR
6.0000 mg/kg | Freq: Once | INTRAVENOUS | Status: AC
  Administered 2023-09-12: 420 mg via INTRAVENOUS
  Filled 2023-09-12: qty 20

## 2023-09-12 MED ORDER — DIPHENHYDRAMINE HCL 25 MG PO CAPS
25.0000 mg | ORAL_CAPSULE | Freq: Once | ORAL | Status: AC
Start: 2023-09-12 — End: 2023-09-12
  Administered 2023-09-12: 25 mg via ORAL
  Filled 2023-09-12: qty 1

## 2023-09-12 NOTE — Progress Notes (Signed)
 Patient Care Team: [name], FNP as PCP - General (Family Medicine) Tyree Nanetta SAILOR, RN as Oncology Nurse Navigator Glean, Stephane BROCKS, RN (Inactive) as Oncology Nurse Navigator Curvin Deward MOULD, MD as Consulting Physician (General Surgery) Odean Potts, MD as Consulting Physician (Hematology and Oncology) Dewey Rush, MD as Consulting Physician (Radiation Oncology)  DIAGNOSIS:  Encounter Diagnosis  Name Primary?   Malignant neoplasm of overlapping sites of right breast in female, estrogen receptor negative (HCC)     SUMMARY OF ONCOLOGIC HISTORY: Oncology History  Malignant neoplasm of overlapping sites of right breast in female, estrogen receptor negative (HCC)  12/31/2022 Initial Diagnosis   Malignant neoplasm of overlapping sites of right breast in female, estrogen receptor negative (HCC)   01/01/2023 Cancer Staging   Staging form: Breast, AJCC 8th Edition - Clinical stage from 01/01/2023: Stage IIA (cT2, cN0, cM0, G3, ER-, PR-, HER2+) - Signed by Odean Potts, MD on 01/02/2023 Stage prefix: Initial diagnosis Method of lymph node assessment: Clinical Histologic grading system: 3 grade system   01/25/2023 -  Chemotherapy   Patient is on Treatment Plan : BREAST  Docetaxel  + Carboplatin  + Trastuzumab  + Pertuzumab   (TCHP) q21d       Genetic Testing   [facility] CancerNext+RNA was Negative. Report date is 01/16/2023.   The [facility] CancerNext+RNAinsight Panel includes sequencing, rearrangement analysis, and RNA analysis for the following 39 genes: APC, ATM, BAP1, BARD1, BMPR1A, BRCA1, BRCA2, BRIP1, CDH1, CDKN2A, CHEK2, FH, FLCN, MET, MLH1, MSH2, MSH6, MUTYH, NF1, NTHL1, PALB2, PMS2, PTEN, RAD51C, RAD51D, SMAD4, STK11, TP53, TSC1, TSC2, and VHL (sequencing and deletion/duplication); AXIN2, HOXB13, MBD4, MSH3, POLD1 and POLE (sequencing only); EPCAM and GREM1 (deletion/duplication only).    Malignant neoplasm of upper-outer quadrant of left breast in female, estrogen receptor positive  (HCC)  12/31/2022 Initial Diagnosis   Malignant neoplasm of upper-outer quadrant of left breast in female, estrogen receptor positive (HCC)   01/01/2023 Cancer Staging   Staging form: Breast, AJCC 8th Edition - Clinical: Stage IIB (cT2, cN1(f), cM0, G3, ER+, PR+, HER2-) - Signed by Lanell Donald Stagger, PA-C on 01/01/2023 Method of lymph node assessment: Core biopsy Histologic grading system: 3 grade system   01/25/2023 -  Chemotherapy   Patient is on Treatment Plan : BREAST  Docetaxel  + Carboplatin  + Trastuzumab  + Pertuzumab   (TCHP) q21d       Genetic Testing   [facility] CancerNext+RNA was Negative. Report date is 01/16/2023.   The [facility] CancerNext+RNAinsight Panel includes sequencing, rearrangement analysis, and RNA analysis for the following 39 genes: APC, ATM, BAP1, BARD1, BMPR1A, BRCA1, BRCA2, BRIP1, CDH1, CDKN2A, CHEK2, FH, FLCN, MET, MLH1, MSH2, MSH6, MUTYH, NF1, NTHL1, PALB2, PMS2, PTEN, RAD51C, RAD51D, SMAD4, STK11, TP53, TSC1, TSC2, and VHL (sequencing and deletion/duplication); AXIN2, HOXB13, MBD4, MSH3, POLD1 and POLE (sequencing only); EPCAM and GREM1 (deletion/duplication only).      CHIEF COMPLIANT: F/U to discuss path report  HISTORY OF PRESENT ILLNESS: Discussed the use of AI scribe software for clinical note transcription with the patient, who gave verbal consent to proceed.  History of Present Illness Donna [name] is a 73 year old female with breast cancer who presents for follow-up after surgery.  She underwent surgery on August 1st for breast cancer. On the left side, a 2.2 cm mass was removed, with four lymph nodes testing positive for cancer. On the right side, a 1.8 cm cancer was removed, with four lymph nodes negative for cancer. The left side cancer cells are estrogen receptor-positive, while the right side are  estrogen receptor-negative. She is currently cancer-free and will continue treatment for one year.  She is taking Ibrance and experiences no difficulty  breathing. Post-surgery, she had a blood transfusion and is no longer anemic, with a hemoglobin level of 12. She experienced episodes of fading in and out of consciousness during the procedure. A second surgery occurred five days after the initial procedure.  Pain is decreasing, managed with Tylenol  Extra Strength and a muscle relaxer. She experiences difficulty sleeping, with sleeplessness and frequent urination as side effects from previous Herceptin  treatment. She avoids addictive medications and manages some pain without additional medication. She uses a binder and manages post-surgical care with drains, which have not yet been removed. No shortness of breath or congestion.     ALLERGIES:  has no known allergies.  MEDICATIONS:  Current Outpatient Medications  Medication Sig Dispense Refill   albuterol  (VENTOLIN  HFA) 108 (90 Base) MCG/ACT inhaler Inhale 2 puffs into the lungs every 4 (four) hours as needed for wheezing or shortness of breath.     amLODipine  (NORVASC ) 10 MG tablet Take 1 tablet (10 mg total) by mouth daily. 90 tablet 3   aspirin  EC 81 MG EC tablet Take 1 tablet (81 mg total) by mouth daily. Swallow whole. (Patient not taking: Reported on 08/12/2023) 30 tablet 11   busPIRone  (BUSPAR ) 5 MG tablet Take 5 mg by mouth 2 (two) times daily.  0   clopidogrel  (PLAVIX ) 75 MG tablet Take 1 tablet (75 mg total) by mouth daily. (Patient not taking: Reported on 08/12/2023)     cyclobenzaprine (FLEXERIL) 5 MG tablet Take 5 mg by mouth 3 (three) times daily as needed for muscle spasms.     DULoxetine  (CYMBALTA ) 60 MG capsule Take 60 mg by mouth daily.     furosemide  (LASIX ) 20 MG tablet TAKE 1 TABLET(20 MG) BY MOUTH DAILY (Patient taking differently: Take 20 mg by mouth daily as needed for edema.) 90 tablet 0   gabapentin  (NEURONTIN ) 100 MG capsule Take 1 capsule (100 mg total) by mouth 2 (two) times daily as needed. 30 capsule 2   lidocaine -prilocaine  (EMLA ) cream Apply to affected area once 30  g 3   LORazepam  (ATIVAN ) 0.5 MG tablet Take 1 tablet (0.5 mg total) by mouth at bedtime as needed for anxiety. 30 tablet 1   losartan  (COZAAR ) 100 MG tablet Take 100 mg by mouth daily.     methocarbamol  (ROBAXIN ) 500 MG tablet Take 1 tablet (500 mg total) by mouth every 6 (six) hours as needed (use for muscle cramps/pain). 30 tablet 2   montelukast  (SINGULAIR ) 10 MG tablet Take 10 mg by mouth at bedtime.  0   Multiple Vitamins-Calcium  (ONE-A-DAY WOMENS FORMULA) TABS Take 1 tablet by mouth daily.     nicotine  (NICODERM CQ  - DOSED IN MG/24 HR) 7 mg/24hr patch Place 1 patch (7 mg total) onto the skin daily as needed (Nicotine  cravings). (Patient not taking: Reported on 08/12/2023) 28 patch 0   NIFEdipine  (PROCARDIA -XL/NIFEDICAL-XL) 30 MG 24 hr tablet Take 30 mg by mouth daily.     ondansetron  (ZOFRAN ) 8 MG tablet Take 1 tablet (8 mg total) by mouth every 8 (eight) hours as needed for nausea or vomiting. Start on the third day after chemotherapy. (Patient not taking: Reported on 08/12/2023) 30 tablet 1   oxyCODONE  (OXY IR/ROXICODONE ) 5 MG immediate release tablet Take 1 tablet (5 mg total) by mouth every 6 (six) hours as needed (for pain score of 1-4). 15 tablet 0   prochlorperazine  (  COMPAZINE ) 10 MG tablet Take 1 tablet (10 mg total) by mouth every 6 (six) hours as needed for nausea or vomiting. (Patient not taking: Reported on 08/12/2023) 30 tablet 1   rosuvastatin  (CRESTOR ) 20 MG tablet Take 1 tablet (20 mg total) by mouth daily. (Patient not taking: Reported on 08/12/2023)     traZODone  (DESYREL ) 100 MG tablet Take 100 mg by mouth at bedtime as needed for sleep.     No current facility-administered medications for this visit.    PHYSICAL EXAMINATION: ECOG PERFORMANCE STATUS: 1 - Symptomatic but completely ambulatory  There were no vitals filed for this visit. There were no vitals filed for this visit.  Physical Exam   (exam performed in the presence of a chaperone)  LABORATORY DATA:  I have  reviewed the data as listed    Latest Ref Rng & Units 08/22/2023    3:55 PM 08/22/2023   12:04 PM 08/21/2023    6:47 PM  CMP  Glucose 70 - 99 mg/dL 856  875  844   BUN 8 - 23 mg/dL 18  18  18    Creatinine 0.44 - 1.00 mg/dL 8.94  9.12  9.16   Sodium 135 - 145 mmol/L 138  140  142   Potassium 3.5 - 5.1 mmol/L 4.1  5.2  4.2   Chloride 98 - 111 mmol/L 111  112  114   CO2 22 - 32 mmol/L 22  15  19    Calcium  8.9 - 10.3 mg/dL 8.8  8.5  7.9   Total Protein 6.5 - 8.1 g/dL  4.2    Total Bilirubin 0.0 - 1.2 mg/dL  0.7    Alkaline Phos 38 - 126 U/L  44    AST 15 - 41 U/L  29    ALT 0 - 44 U/L  13      Lab Results  Component Value Date   WBC 13.5 (H) 08/23/2023   HGB 9.3 (L) 08/23/2023   HCT 27.7 (L) 08/23/2023   MCV 95.2 08/23/2023   PLT 179 08/23/2023   NEUTROABS 9.7 (H) 08/21/2023    ASSESSMENT & PLAN:  Malignant neoplasm of overlapping sites of right breast in female, estrogen receptor negative (HCC) 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%, HER2 3+ positive   Treatment plan: Neoadjuvant TCHP x 5 cycles (stopped early for toxicities) followed by HP maintenance versus Kadcyla maintenance 09/04/2023: Left mastectomy: Residual IDC 2.2 cm with DCIS, margins negative for invasive cancer, DCIS: Present at anterior-inferior margin, 4/6 sentinel lymph nodes positive, ER 100%, PR 60%, Ki67 10%, HER2 1+; right mastectomy: 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 I discussed the margins as well as lymph node surgeries. We also discussed the final staging along with previously performed ER/PR and HER-2/neu testing.  Treatment plan:  Recommend Kadcyla maintenance (with the next cycle) Adjuvant radiation therapy Adjuvant antiestrogen therapy and neratinib    She had a prior history of stroke. ------------------------------------- Assessment and Plan Assessment & Plan Breast cancer, status post bilateral surgery, under active treatment Status post bilateral breast surgery with complete cancer removal. Left breast had a 2.2 cm tumor with four positive lymph nodes; right breast had a 1.8 cm tumor with no positive lymph nodes. Estrogen receptor positive on the left, negative on the right. Currently cancer-free but requires further treatment due to positive lymph nodes. - Replace Herceptin  with Kadcyla for targeted HER2 therapy. - Administer Kadcyla every three weeks for ten treatments. - Monitor cardiac function with echocardiogram. - Monitor blood counts regularly. - Evaluate eligibility for [facility] HER2 clinical trial. - Prescribe anti-estrogen pills post-radiation therapy. - Fax prescription for bra to [facility] [place].  Insomnia Insomnia potentially related to previous Herceptin  treatment, causing sleeplessness and increased urination. Concern about addiction to sleep medications but requires assistance with sleep due to pain and previous treatment effects. - Prescribe sleep medication for occasional use, advising against daily use to prevent addiction.      No orders of the defined types were  placed in this encounter.  The patient has a good understanding of the overall plan. she agrees with it. she will call with any problems that may develop before the next visit here. Total time spent: 30 mins including face to face time and time spent for planning, charting and co-ordination of care   Naomi MARLA Chad, MD 09/12/23

## 2023-09-12 NOTE — Telephone Encounter (Signed)
 Per Dr. Gudena, OK to proceed with tx with 5/2 ECHO.

## 2023-09-12 NOTE — Patient Instructions (Signed)

## 2023-09-17 NOTE — Progress Notes (Signed)
 New Breast Cancer Diagnosis: Left Breast UOQ    Histology per Pathology Report: grade 2, Invasive Ductal Carcinoma     Left Breast-Receptor Status: ER(Positive), PR (Positive), Her2-neu (Negative), Ki-(40%)  Right Breast-Receptor Status: ER(Negative), PR (Negative), Her2-neu (Positive), Ki-(15%)  Surgeon and surgical plan, if any:  Dr. Curvin -Bilateral Mastectomy with SLN biopsy 08/16/2023 -Irrigation and debridement of wound (right) 08/21/2023 -Drains removed on Tuesday.   Medical oncologist, treatment if any:   Dr. Gudena 09/12/2023 Treatment plan: Recommend Kadcyla maintenance (with the next cycle) Adjuvant radiation therapy Adjuvant antiestrogen therapy and neratinib   Family History of Breast/Ovarian/Prostate Cancer: Niece has breast cancer.  Sister had ovarian cancer.  Brother had prostate cancer.  Lymphedema issues, if any:  None.  She continues to wear her breast binder.     Pain issues, if any: 5/10 pain at the surgical sites.     SAFETY ISSUES: Prior radiation? No Pacemaker/ICD? No Possible current pregnancy? Postmenopausal Is the patient on methotrexate? No  Current Complaints / other details:   -Port in place

## 2023-09-19 ENCOUNTER — Ambulatory Visit
Admission: RE | Admit: 2023-09-19 | Discharge: 2023-09-19 | Disposition: A | Discharge status: Home / Self Care | Source: Ambulatory Visit | Attending: Radiation Oncology | Admitting: Radiation Oncology

## 2023-09-19 ENCOUNTER — Encounter: Payer: Self-pay | Admitting: Radiation Oncology

## 2023-09-19 ENCOUNTER — Ambulatory Visit: Admitting: Radiation Oncology

## 2023-09-19 VITALS — BP 154/64 | HR 96 | Temp 97.7°F | Resp 20 | Ht 69.0 in | Wt 156.0 lb

## 2023-09-19 DIAGNOSIS — C50811 Malignant neoplasm of overlapping sites of right female breast: Secondary | ICD-10-CM | POA: Insufficient documentation

## 2023-09-19 DIAGNOSIS — Z8041 Family history of malignant neoplasm of ovary: Secondary | ICD-10-CM | POA: Diagnosis not present

## 2023-09-19 DIAGNOSIS — Z7982 Long term (current) use of aspirin: Secondary | ICD-10-CM | POA: Insufficient documentation

## 2023-09-19 DIAGNOSIS — Z170 Estrogen receptor positive status [ER+]: Secondary | ICD-10-CM

## 2023-09-19 DIAGNOSIS — C170 Malignant neoplasm of duodenum: Secondary | ICD-10-CM | POA: Diagnosis not present

## 2023-09-19 DIAGNOSIS — Z171 Estrogen receptor negative status [ER-]: Secondary | ICD-10-CM | POA: Insufficient documentation

## 2023-09-19 DIAGNOSIS — Z79899 Other long term (current) drug therapy: Secondary | ICD-10-CM | POA: Insufficient documentation

## 2023-09-19 DIAGNOSIS — Z803 Family history of malignant neoplasm of breast: Secondary | ICD-10-CM | POA: Diagnosis not present

## 2023-09-19 DIAGNOSIS — Z7902 Long term (current) use of antithrombotics/antiplatelets: Secondary | ICD-10-CM | POA: Insufficient documentation

## 2023-09-19 DIAGNOSIS — I10 Essential (primary) hypertension: Secondary | ICD-10-CM | POA: Diagnosis not present

## 2023-09-19 DIAGNOSIS — C50412 Malignant neoplasm of upper-outer quadrant of left female breast: Secondary | ICD-10-CM | POA: Diagnosis present

## 2023-09-19 DIAGNOSIS — Z801 Family history of malignant neoplasm of trachea, bronchus and lung: Secondary | ICD-10-CM | POA: Insufficient documentation

## 2023-09-19 DIAGNOSIS — F17210 Nicotine dependence, cigarettes, uncomplicated: Secondary | ICD-10-CM | POA: Insufficient documentation

## 2023-09-19 DIAGNOSIS — Z800 Family history of malignant neoplasm of digestive organs: Secondary | ICD-10-CM | POA: Insufficient documentation

## 2023-09-19 DIAGNOSIS — Z8673 Personal history of transient ischemic attack (TIA), and cerebral infarction without residual deficits: Secondary | ICD-10-CM | POA: Insufficient documentation

## 2023-09-19 DIAGNOSIS — E785 Hyperlipidemia, unspecified: Secondary | ICD-10-CM | POA: Diagnosis not present

## 2023-09-19 DIAGNOSIS — L7632 Postprocedural hematoma of skin and subcutaneous tissue following other procedure: Secondary | ICD-10-CM | POA: Insufficient documentation

## 2023-09-19 NOTE — Progress Notes (Signed)
 Radiation Oncology         [phone] ________________________________  Name: Donna [name]        MRN: [id number]  Date of Service: 09/19/2023 DOB: 1950/06/11  RR:Olrjd, Annabella SAILOR, FNP  Donna Potts, MD     REFERRING PHYSICIAN: Odean Potts, MD   DIAGNOSIS: The primary encounter diagnosis was Malignant neoplasm of upper-outer quadrant of left breast in female, estrogen receptor positive (HCC). A diagnosis of Malignant neoplasm of overlapping sites of right breast in female, estrogen receptor negative (HCC) was also pertinent to this visit.   HISTORY OF PRESENT ILLNESS: Donna [name] is a 73 y.o. female originally seen in the multidisciplinary breast clinic for a new diagnosis of bilateral breast cancer. The patient was noted to have bilateral screening breast abnormalities on 01/28/22. She had left breast  thickening and skin retraction and calcifications. By diagnostic work up including ultrasound on 12/20/22, she had a 3.2 cm mass in the 3:00 position of the left breast as well as a 9 [name] adjacent to this. One abnormal left axillary lymph node was also noted. On the right side, there was a mass in the UOQ and associated calcifications. By diagnostic work up  there was a 2.7 cm mass in the 11:00 position, an 8 [name] mass in the 12:00 position, and posterior to the primary mass there were calcifications (not biopsied). The right axillar was negative for adenopathy. 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 The 9 [name] lesion was biopsied and was benign. The left axillary node biopsy was consistent with metastatic disease. 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 The cancer cells were ER/PR negative HER2 amplified with a Ki  67 of 15%.   Since her last visit, the patient was counseled on the role of neoadjuvant chemotherapy as opposed to surgery upfront.  She underwent an MRI of the breasts on 01/31/2023, and the right breast the known cancer in the 12:00 location measured 3 cm as well as non-mass enhancement and associated subcentimeter nodules superior to the dominant mass as well as a subcentimeter medial mass from the dominant, 3 adjacent subcentimeter masses in the lower breast were also seen again felt to be satellite lesions and numerous enhancing foci throughout the breast are more likely to represent background enhancement.  The left breast showed the known mass in the 3:00 breast measuring 3.2 cm and adjacent satellite mass with susceptibility artifact felt to be discordant pathology of stromal fibrosis was noted and up to 4.2 cm of non-mass enhancement in the lower outer quadrant, at least 4 adjacent masses in the central breast the largest measuring 1 cm were located medial to the dominant mass also felt to be satellite lesions with numerous enhancement throughout the breast consistent with background parenchymal enhancement.  The left axillary node persisted but no additional adenopathy was appreciated.  She began systemic chemotherapy on 01/25/2023.  She completed her chemotherapy on 04/17/2023 and has continued with HER2 antibody therapy since.  Her post chemotherapy MRI on 05/09/2023 showed a significant decrease in the size of the dominant mass in the right breast now measuring 1.2 cm with the satellite nodules having also decreased in size with no new suspicious findings.  In the left breast the  known cancer appeared to be more necrotic though the overall size had not significantly changed and the multiple satellite nodules persisted.  No new findings were appreciated but her biopsy-proven node in the left axilla appeared similar.  She proceeded with surgery but this was delayed because of needing cardiac clearance.  She  ultimately underwent bilateral mastectomies with targeted left axillary dissection on    She developed a postoperative hematoma within the right chest wall and underwent irrigation and debridement of this on 08/21/2023.  During this phase she was hospitalized and ultimately discharged on 09/02/2023.  She will continue her to therapy and is seen to discuss adjuvant radiation.    PREVIOUS RADIATION THERAPY: No   PAST MEDICAL HISTORY:  Past Medical History:  Diagnosis Date   Allergy    Anxiety    Breast cancer (HCC)    Depression    Hyperlipidemia    no meds    Hypertension    Stroke (HCC) 05/2020   mild weakness on left side of body       PAST SURGICAL HISTORY: Past Surgical History:  Procedure Laterality Date   AXILLARY LYMPH NODE DISSECTION Left 08/16/2023   Procedure: REDGIE HARD;  Surgeon: Donna Deward MOULD, MD;  Location: [facility] OR;  Service: General;  Laterality: Left;   BREAST BIOPSY Left 12/21/2022   US  LT BREAST BX W LOC DEV 1ST LESION IMG BX SPEC US  GUIDE 12/21/2022 [facility]   BREAST BIOPSY Left 12/21/2022   US  LT BREAST BX W LOC DEV EA ADD LESION IMG BX SPEC US  GUIDE 12/21/2022 [facility]   BREAST BIOPSY Right 12/26/2022   US  RT BREAST BX W LOC DEV 1ST LESION IMG BX SPEC US  GUIDE 12/26/2022 [facility]   BREAST BIOPSY Right 12/26/2022   US  RT BREAST BX W LOC DEV EA ADD LESION IMG BX SPEC US  GUIDE 12/26/2022 [facility]   BREAST BIOPSY Right 12/26/2022   [name] RT BREAST BX W LOC DEV 1ST LESION IMAGE BX SPEC STEREO GUIDE 12/26/2022 [facility]   BREAST BIOPSY Left 08/15/2023   US  LT RADIOACTIVE SEED LOC 08/15/2023 [facility]   DENTAL SURGERY     INCISION AND DRAINAGE OF WOUND Right 08/21/2023   Procedure: IRRIGATION AND DEBRIDEMENT WOUND;  Surgeon: Donna Deward MOULD, MD;  Location: [facility];  Service: General;  Laterality: Right;  WOUND WASHOUT AND CLOSURE   IR IMAGING GUIDED PORT INSERTION  01/22/2023   MASTECTOMY W/ SENTINEL NODE  BIOPSY Bilateral 08/16/2023   Procedure: MASTECTOMY WITH SENTINEL LYMPH NODE BIOPSY;  Surgeon: Donna Deward MOULD, MD;  Location: [facility];  Service: General;  Laterality: Bilateral;  GEN w/PEC BLOCK LEFT MASTY WITH SENTINEL NODE AND TARGETED NODE DISSECTION RIGHT MASTECTOMY WITH SENTINEL NODE   NSVD     x2   TUBAL LIGATION  1976     FAMILY HISTORY:  Family History  Problem Relation Age of Onset   Colon cancer Mother 34 - 38   Cancer Father 45 - 13       unknown type   Colon polyps Sister    Esophageal cancer Sister 60 - 58   Lung cancer Sister 70 - 36   Ovarian cancer Sister 36   Colon cancer Brother 49   Prostate cancer Brother    Breast cancer Niece        dx. <50   Rectal cancer Neg Hx    Stomach cancer Neg Hx    Pancreatic cancer Neg Hx  SOCIAL HISTORY:  reports that she has been smoking cigarettes. She has never used smokeless tobacco. She reports current alcohol use. She reports that she does not use drugs. The patient is divorced and lives in [place]. 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    ALLERGIES: Patient has no known allergies.   MEDICATIONS:  Current Outpatient Medications  Medication Sig Dispense Refill   albuterol  (VENTOLIN  HFA) 108 (90 Base) MCG/ACT inhaler Inhale 2 puffs into the lungs every 4 (four) hours as needed for wheezing or shortness of breath.     amLODipine  (NORVASC ) 10 MG tablet Take 1 tablet (10 mg total) by mouth daily. 90 tablet 3   aspirin  EC 81 MG EC tablet Take 1 tablet (81 mg total) by mouth daily. Swallow whole. 30 tablet 11   busPIRone  (BUSPAR ) 5 MG tablet Take 5 mg by mouth 2 (two) times daily.  0   clopidogrel  (PLAVIX ) 75 MG tablet Take 1 tablet (75 mg total) by mouth daily.     cyclobenzaprine (FLEXERIL) 5 MG tablet Take 5 mg by mouth 3 (three) times daily as needed for muscle spasms.     DULoxetine  (CYMBALTA ) 60 MG capsule Take 60 mg by mouth daily.     furosemide  (LASIX ) 20 MG  tablet TAKE 1 TABLET(20 MG) BY MOUTH DAILY 90 tablet 0   gabapentin  (NEURONTIN ) 100 MG capsule Take 1 capsule (100 mg total) by mouth 2 (two) times daily as needed. 30 capsule 2   lidocaine -prilocaine  (EMLA ) cream Apply to affected area once 30 g 3   LORazepam  (ATIVAN ) 0.5 MG tablet Take 1 tablet (0.5 mg total) by mouth at bedtime as needed for anxiety. 30 tablet 1   losartan  (COZAAR ) 100 MG tablet Take 100 mg by mouth daily.     methocarbamol  (ROBAXIN ) 500 MG tablet Take 1 tablet (500 mg total) by mouth every 6 (six) hours as needed (use for muscle cramps/pain). 30 tablet 2   montelukast  (SINGULAIR ) 10 MG tablet Take 10 mg by mouth at bedtime.  0   Multiple Vitamins-Calcium  (ONE-A-DAY WOMENS FORMULA) TABS Take 1 tablet by mouth daily.     nicotine  (NICODERM CQ  - DOSED IN MG/24 HR) 7 mg/24hr patch Place 1 patch (7 mg total) onto the skin daily as needed (Nicotine  cravings). 28 patch 0   NIFEdipine  (PROCARDIA -XL/NIFEDICAL-XL) 30 MG 24 hr tablet Take 30 mg by mouth daily.     ondansetron  (ZOFRAN ) 8 MG tablet Take 1 tablet (8 mg total) by mouth every 8 (eight) hours as needed for nausea or vomiting. Start on the third day after chemotherapy. 30 tablet 1   oxyCODONE  (OXY IR/ROXICODONE ) 5 MG immediate release tablet Take 1 tablet (5 mg total) by mouth every 6 (six) hours as needed (for pain score of 1-4). 15 tablet 0   prochlorperazine  (COMPAZINE ) 10 MG tablet Take 1 tablet (10 mg total) by mouth every 6 (six) hours as needed for nausea or vomiting. 30 tablet 1   rosuvastatin  (CRESTOR ) 20 MG tablet Take 1 tablet (20 mg total) by mouth daily.     traZODone  (DESYREL ) 100 MG tablet Take 100 mg by mouth at bedtime as needed for sleep.     zolpidem  (AMBIEN ) 5 MG tablet Take 1 tablet (5 mg total) by mouth at bedtime as needed for sleep. 30 tablet 1   No current facility-administered medications for this encounter.     REVIEW OF SYSTEMS: On review of systems, the  patient reports that she is doing okay. She  states she thinks she's taking an antibiotic twice a day that she started yesterday. She is denies any fullness in her right chest wall since her drains were removed, and denies any drainage from her incision sites. She denies fevers. No other complaints are verbalized.      PHYSICAL EXAM:  Wt Readings from Last 3 Encounters:  09/19/23 156 lb (70.8 kg)  09/12/23 158 lb (71.7 kg)  09/12/23 185 lb (83.9 kg)   Temp Readings from Last 3 Encounters:  09/19/23 97.7 F (36.5 C)  09/12/23 97.7 F (36.5 C) (Temporal)  08/25/23 97.9 F (36.6 C) (Oral)   BP Readings from Last 3 Encounters:  09/19/23 (!) 154/64  09/12/23 (!) 158/74  08/25/23 (!) 157/56   Pulse Readings from Last 3 Encounters:  09/19/23 96  09/12/23 91  08/25/23 62    In general this is a well appearing African American female in no acute distress. She's alert and oriented x4 and appropriate throughout the examination. Cardiopulmonary assessment is negative for acute distress and she exhibits normal effort. Her bilateral chest wall is evaluated. She has redundant skin along the right chest wall and well healed incision site and JP drain site is healing and approximated. The left chest wall has a well healed surgical site but has erythema more centrally and fluctuance in the area which is about 3 cm in diameter. The erythema is warm to the touch and she acknowledges more discomfort in this area since Tuesday.      ECOG = 1  0 - Asymptomatic (Fully active, able to carry on all predisease activities without restriction)  1 - Symptomatic but completely ambulatory (Restricted in physically strenuous activity but ambulatory and able to carry out work of a light or sedentary nature. For example, light housework, office work)  2 - Symptomatic, <50% in bed during the day (Ambulatory and capable of all self care but unable to carry out any work activities. Up and about more than 50% of waking hours)  3 - Symptomatic, >50% in bed,  but not bedbound (Capable of only limited self-care, confined to bed or chair 50% or more of waking hours)  4 - Bedbound (Completely disabled. Cannot carry on any self-care. Totally confined to bed or chair)  5 - Death   Donna [name], Creech RH, Tormey DC, et al. [phone]). Toxicity and response criteria of the [facility]. Am. Donna [name]. Oncol. 5 (6): 649-55    LABORATORY DATA:  Lab Results  Component Value Date   WBC 12.9 (H) 09/12/2023   HGB 12.1 09/12/2023   HCT 35.8 (L) 09/12/2023   MCV 97.5 09/12/2023   PLT 256 09/12/2023   Lab Results  Component Value Date   NA 140 09/12/2023   K 3.7 09/12/2023   CL 108 09/12/2023   CO2 27 09/12/2023   Lab Results  Component Value Date   ALT 9 09/12/2023   AST 13 (L) 09/12/2023   ALKPHOS 101 09/12/2023   BILITOT 0.3 09/12/2023      RADIOGRAPHY: No results found.      IMPRESSION/PLAN: 1. 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    Dr. Dewey has reviewed the patient's course to date and her most recent imaging and pathologic results.  The patient has completed the course of chemotherapy but continues with targeted  HER2 based treatment with Dr. Gudena.  She had a complicated postoperative course regarding her right chest wall hematoma and appears to have a cellulitis of her left chest wall incision site.  I reached out to Dr. Curvin and also called the patient's pharmacy.  She believes that she has been taking doxycycline after talking with the pharmacist this was confirmed.  I did reach out however prior to this to Dr. Curvin to make sure he knew the findings.  We discussed delaying simulation for radiation but reviewed the rationale to administer postmastectomy radiotherapy to the left chest wall and regional lymph nodes.  Her right breast specimen does not meet  criteria for postmastectomy radiation at this time.  She is aware of the treatment to the left chest wall is meant to reduce risks of local recurrence. Dr. Odean anticipates adjuvant antiestrogen therapy to follow. We discussed the risks, benefits, short, and long term effects of radiotherapy, as well as the curative intent, and the patient is interested in proceeding. I reviewed the delivery and logistics of radiotherapy and anticipates a course of 6 1/2 weeks of radiotherapy to left chest wall and regional lymph nodes.  Again we will delay simulation another week and a half for her cellulitis to improve.  Written consent is obtained and placed in the chart, a copy was provided to the patient.      In a visit lasting 45 minutes, greater than 50% of the time was spent face to face reviewing her case, as well as in preparation of, discussing, and coordinating the patient's care.      Donna [name], [facility]    **Disclaimer: This note was dictated with voice recognition software. Similar sounding words can inadvertently be transcribed and this note may contain transcription errors which may not have been corrected upon publication of note.**

## 2023-09-26 ENCOUNTER — Telehealth: Payer: Self-pay | Admitting: Radiation Oncology

## 2023-09-26 ENCOUNTER — Encounter: Payer: Self-pay | Admitting: *Deleted

## 2023-09-26 NOTE — Telephone Encounter (Signed)
 Pt called to advise she was just notified that medicaid will not cover her transportation for upcoming CT SIM appt 9/16. She is asking for assistance with this issue to make it to her appts. Pt was advised I would reach out to the transportation coordinator Christian who would give her a call with any assistance we can offer or other resources she may qualify for. Pt verified best c/b number [phone] and was very grateful for assistance.

## 2023-10-01 ENCOUNTER — Ambulatory Visit
Admission: RE | Admit: 2023-10-01 | Discharge: 2023-10-01 | Disposition: A | Discharge status: Home / Self Care | Source: Ambulatory Visit | Attending: Radiation Oncology | Admitting: Radiation Oncology

## 2023-10-01 ENCOUNTER — Inpatient Hospital Stay: Attending: Hematology and Oncology

## 2023-10-01 DIAGNOSIS — C50412 Malignant neoplasm of upper-outer quadrant of left female breast: Secondary | ICD-10-CM | POA: Insufficient documentation

## 2023-10-01 DIAGNOSIS — Z510 Encounter for antineoplastic radiation therapy: Secondary | ICD-10-CM | POA: Insufficient documentation

## 2023-10-01 DIAGNOSIS — Z170 Estrogen receptor positive status [ER+]: Secondary | ICD-10-CM | POA: Insufficient documentation

## 2023-10-01 NOTE — Progress Notes (Signed)
 Pt seen in simulation. Left mastectomy scar well healed. We will proceed with simulation.

## 2023-10-02 ENCOUNTER — Ambulatory Visit (HOSPITAL_COMMUNITY)
Admission: RE | Admit: 2023-10-02 | Discharge: 2023-10-02 | Disposition: A | Discharge status: Home / Self Care | Source: Ambulatory Visit | Attending: Hematology and Oncology | Admitting: Hematology and Oncology

## 2023-10-02 ENCOUNTER — Other Ambulatory Visit: Payer: Self-pay

## 2023-10-02 ENCOUNTER — Inpatient Hospital Stay

## 2023-10-02 DIAGNOSIS — Z5181 Encounter for therapeutic drug level monitoring: Secondary | ICD-10-CM | POA: Insufficient documentation

## 2023-10-02 DIAGNOSIS — Z79899 Other long term (current) drug therapy: Secondary | ICD-10-CM | POA: Diagnosis not present

## 2023-10-02 LAB — ECHOCARDIOGRAM COMPLETE
Area-P 1/2: 3.36 cm2
Calc EF: 60.4 %
S' Lateral: 2.5 cm
Single Plane A2C EF: 60.5 %
Single Plane A4C EF: 59.8 %

## 2023-10-03 ENCOUNTER — Inpatient Hospital Stay

## 2023-10-03 ENCOUNTER — Inpatient Hospital Stay: Admitting: Hematology and Oncology

## 2023-10-07 ENCOUNTER — Telehealth: Payer: Self-pay | Admitting: Radiation Oncology

## 2023-10-07 NOTE — Telephone Encounter (Signed)
 9/22 patient left voicemail to speak to someone about her schedule for October.  Email forward to Support RTT and copied L4, so they are aware.

## 2023-10-08 ENCOUNTER — Telehealth: Payer: Self-pay | Admitting: *Deleted

## 2023-10-08 ENCOUNTER — Encounter: Payer: Self-pay | Admitting: *Deleted

## 2023-10-08 NOTE — Telephone Encounter (Signed)
 Received VM from pt.  Rn attempt x1 to return call, no answer. LVM

## 2023-10-09 ENCOUNTER — Other Ambulatory Visit: Payer: Self-pay

## 2023-10-15 ENCOUNTER — Ambulatory Visit: Admitting: Radiation Oncology

## 2023-10-15 DIAGNOSIS — C50412 Malignant neoplasm of upper-outer quadrant of left female breast: Secondary | ICD-10-CM | POA: Diagnosis not present

## 2023-10-16 ENCOUNTER — Ambulatory Visit
Admission: RE | Admit: 2023-10-16 | Discharge: 2023-10-16 | Disposition: A | Discharge status: Home / Self Care | Source: Ambulatory Visit | Attending: Radiation Oncology | Admitting: Radiation Oncology

## 2023-10-16 ENCOUNTER — Other Ambulatory Visit: Payer: Self-pay

## 2023-10-16 DIAGNOSIS — C50811 Malignant neoplasm of overlapping sites of right female breast: Secondary | ICD-10-CM | POA: Diagnosis present

## 2023-10-16 DIAGNOSIS — G4700 Insomnia, unspecified: Secondary | ICD-10-CM | POA: Diagnosis not present

## 2023-10-16 DIAGNOSIS — Z170 Estrogen receptor positive status [ER+]: Secondary | ICD-10-CM | POA: Insufficient documentation

## 2023-10-16 DIAGNOSIS — C50412 Malignant neoplasm of upper-outer quadrant of left female breast: Secondary | ICD-10-CM | POA: Insufficient documentation

## 2023-10-16 DIAGNOSIS — Z171 Estrogen receptor negative status [ER-]: Secondary | ICD-10-CM | POA: Insufficient documentation

## 2023-10-16 DIAGNOSIS — Z5112 Encounter for antineoplastic immunotherapy: Secondary | ICD-10-CM | POA: Diagnosis present

## 2023-10-16 DIAGNOSIS — F32A Depression, unspecified: Secondary | ICD-10-CM | POA: Insufficient documentation

## 2023-10-16 LAB — RAD ONC ARIA SESSION SUMMARY

## 2023-10-17 ENCOUNTER — Ambulatory Visit
Admission: RE | Admit: 2023-10-17 | Discharge: 2023-10-17 | Disposition: A | Source: Ambulatory Visit | Attending: Radiation Oncology

## 2023-10-17 ENCOUNTER — Other Ambulatory Visit: Payer: Self-pay

## 2023-10-17 DIAGNOSIS — Z5112 Encounter for antineoplastic immunotherapy: Secondary | ICD-10-CM | POA: Diagnosis not present

## 2023-10-17 LAB — RAD ONC ARIA SESSION SUMMARY

## 2023-10-18 ENCOUNTER — Ambulatory Visit

## 2023-10-21 ENCOUNTER — Ambulatory Visit
Admission: RE | Admit: 2023-10-21 | Discharge: 2023-10-21 | Disposition: A | Discharge status: Home / Self Care | Source: Ambulatory Visit | Attending: Radiation Oncology | Admitting: Radiation Oncology

## 2023-10-21 ENCOUNTER — Other Ambulatory Visit: Payer: Self-pay

## 2023-10-21 DIAGNOSIS — Z5112 Encounter for antineoplastic immunotherapy: Secondary | ICD-10-CM | POA: Diagnosis not present

## 2023-10-21 LAB — RAD ONC ARIA SESSION SUMMARY
Course Elapsed Days: 5
Plan Fractions Treated to Date: 2
Plan Fractions Treated to Date: 3
Plan Prescribed Dose Per Fraction: 1.8 Gy
Plan Prescribed Dose Per Fraction: 1.8 Gy
Plan Total Fractions Prescribed: 14
Plan Total Fractions Prescribed: 28
Plan Total Prescribed Dose: 25.2 Gy
Plan Total Prescribed Dose: 50.4 Gy
Reference Point Dosage Given to Date: 3.6 Gy
Reference Point Dosage Given to Date: 5.4 Gy
Reference Point Session Dosage Given: 1.8 Gy
Reference Point Session Dosage Given: 1.8 Gy
Session Number: 3

## 2023-10-22 ENCOUNTER — Ambulatory Visit
Admission: RE | Admit: 2023-10-22 | Discharge: 2023-10-22 | Disposition: A | Discharge status: Home / Self Care | Source: Ambulatory Visit | Attending: Radiation Oncology | Admitting: Radiation Oncology

## 2023-10-22 ENCOUNTER — Other Ambulatory Visit: Payer: Self-pay

## 2023-10-22 DIAGNOSIS — Z5112 Encounter for antineoplastic immunotherapy: Secondary | ICD-10-CM | POA: Diagnosis not present

## 2023-10-22 LAB — RAD ONC ARIA SESSION SUMMARY
Course Elapsed Days: 6
Plan Fractions Treated to Date: 2
Plan Fractions Treated to Date: 4
Plan Prescribed Dose Per Fraction: 1.8 Gy
Plan Prescribed Dose Per Fraction: 1.8 Gy
Plan Total Fractions Prescribed: 14
Plan Total Fractions Prescribed: 28
Plan Total Prescribed Dose: 25.2 Gy
Plan Total Prescribed Dose: 50.4 Gy
Reference Point Dosage Given to Date: 3.6 Gy
Reference Point Dosage Given to Date: 7.2 Gy
Reference Point Session Dosage Given: 1.8 Gy
Reference Point Session Dosage Given: 1.8 Gy
Session Number: 4

## 2023-10-23 ENCOUNTER — Other Ambulatory Visit: Payer: Self-pay

## 2023-10-23 ENCOUNTER — Ambulatory Visit
Admission: RE | Admit: 2023-10-23 | Discharge: 2023-10-23 | Disposition: A | Discharge status: Home / Self Care | Source: Ambulatory Visit | Attending: Radiation Oncology | Admitting: Radiation Oncology

## 2023-10-23 DIAGNOSIS — Z5112 Encounter for antineoplastic immunotherapy: Secondary | ICD-10-CM | POA: Diagnosis not present

## 2023-10-23 LAB — RAD ONC ARIA SESSION SUMMARY
Course Elapsed Days: 7
Plan Fractions Treated to Date: 3
Plan Fractions Treated to Date: 5
Plan Prescribed Dose Per Fraction: 1.8 Gy
Plan Prescribed Dose Per Fraction: 1.8 Gy
Plan Total Fractions Prescribed: 14
Plan Total Fractions Prescribed: 28
Plan Total Prescribed Dose: 25.2 Gy
Plan Total Prescribed Dose: 50.4 Gy
Reference Point Dosage Given to Date: 5.4 Gy
Reference Point Dosage Given to Date: 9 Gy
Reference Point Session Dosage Given: 1.8 Gy
Reference Point Session Dosage Given: 1.8 Gy
Session Number: 5

## 2023-10-24 ENCOUNTER — Ambulatory Visit
Admission: RE | Admit: 2023-10-24 | Discharge: 2023-10-24 | Disposition: A | Discharge status: Home / Self Care | Source: Ambulatory Visit | Attending: Radiation Oncology | Admitting: Radiation Oncology

## 2023-10-24 ENCOUNTER — Ambulatory Visit
Admission: RE | Admit: 2023-10-24 | Discharge: 2023-10-24 | Disposition: A | Source: Ambulatory Visit | Attending: Radiation Oncology

## 2023-10-24 ENCOUNTER — Other Ambulatory Visit: Payer: Self-pay

## 2023-10-24 DIAGNOSIS — C50811 Malignant neoplasm of overlapping sites of right female breast: Secondary | ICD-10-CM

## 2023-10-24 DIAGNOSIS — Z170 Estrogen receptor positive status [ER+]: Secondary | ICD-10-CM

## 2023-10-24 DIAGNOSIS — Z5112 Encounter for antineoplastic immunotherapy: Secondary | ICD-10-CM | POA: Diagnosis not present

## 2023-10-24 LAB — RAD ONC ARIA SESSION SUMMARY
Course Elapsed Days: 8
Plan Fractions Treated to Date: 3
Plan Fractions Treated to Date: 6
Plan Prescribed Dose Per Fraction: 1.8 Gy
Plan Prescribed Dose Per Fraction: 1.8 Gy
Plan Total Fractions Prescribed: 14
Plan Total Fractions Prescribed: 28
Plan Total Prescribed Dose: 25.2 Gy
Plan Total Prescribed Dose: 50.4 Gy
Reference Point Dosage Given to Date: 10.8 Gy
Reference Point Dosage Given to Date: 5.4 Gy
Reference Point Session Dosage Given: 1.8 Gy
Reference Point Session Dosage Given: 1.8 Gy
Session Number: 6

## 2023-10-24 MED ORDER — RADIAPLEXRX EX GEL: Freq: Once | CUTANEOUS | Status: AC

## 2023-10-24 MED ORDER — ALRA NON-METALLIC DEODORANT (RAD-ONC)
1.0000 | Freq: Once | TOPICAL | Status: AC
  Administered 2023-10-24: 1 via TOPICAL

## 2023-10-25 ENCOUNTER — Ambulatory Visit
Admission: RE | Admit: 2023-10-25 | Discharge: 2023-10-25 | Disposition: A | Discharge status: Home / Self Care | Source: Ambulatory Visit | Attending: Radiation Oncology | Admitting: Radiation Oncology

## 2023-10-25 ENCOUNTER — Other Ambulatory Visit: Payer: Self-pay

## 2023-10-25 ENCOUNTER — Encounter: Payer: Self-pay | Admitting: Adult Health

## 2023-10-25 ENCOUNTER — Inpatient Hospital Stay

## 2023-10-25 ENCOUNTER — Inpatient Hospital Stay: Attending: Hematology and Oncology

## 2023-10-25 ENCOUNTER — Inpatient Hospital Stay: Admitting: Adult Health

## 2023-10-25 VITALS — BP 144/60 | HR 78 | Temp 97.2°F | Resp 18 | Wt 151.5 lb

## 2023-10-25 DIAGNOSIS — C50811 Malignant neoplasm of overlapping sites of right female breast: Secondary | ICD-10-CM | POA: Insufficient documentation

## 2023-10-25 DIAGNOSIS — G4700 Insomnia, unspecified: Secondary | ICD-10-CM | POA: Insufficient documentation

## 2023-10-25 DIAGNOSIS — F32A Depression, unspecified: Secondary | ICD-10-CM | POA: Insufficient documentation

## 2023-10-25 DIAGNOSIS — Z170 Estrogen receptor positive status [ER+]: Secondary | ICD-10-CM | POA: Insufficient documentation

## 2023-10-25 DIAGNOSIS — Z5112 Encounter for antineoplastic immunotherapy: Secondary | ICD-10-CM | POA: Diagnosis not present

## 2023-10-25 DIAGNOSIS — Z171 Estrogen receptor negative status [ER-]: Secondary | ICD-10-CM | POA: Diagnosis not present

## 2023-10-25 DIAGNOSIS — C50412 Malignant neoplasm of upper-outer quadrant of left female breast: Secondary | ICD-10-CM

## 2023-10-25 LAB — CMP (CANCER CENTER ONLY)
ALT: 11 U/L (ref 0–44)
AST: 19 U/L (ref 15–41)
Albumin: 3.6 g/dL (ref 3.5–5.0)
Alkaline Phosphatase: 94 U/L (ref 38–126)
Anion gap: 3 — ABNORMAL LOW (ref 5–15)
BUN: 13 mg/dL (ref 8–23)
CO2: 27 mmol/L (ref 22–32)
Calcium: 9.5 mg/dL (ref 8.9–10.3)
Chloride: 113 mmol/L — ABNORMAL HIGH (ref 98–111)
Creatinine: 0.7 mg/dL (ref 0.44–1.00)
GFR, Estimated: >60 mL/min (ref >60)
Glucose, Bld: 97 mg/dL (ref 70–99)
Potassium: 3.6 mmol/L (ref 3.5–5.1)
Sodium: 143 mmol/L (ref 135–145)
Total Bilirubin: 0.3 mg/dL (ref 0.0–1.2)
Total Protein: 5.9 g/dL — ABNORMAL LOW (ref 6.5–8.1)

## 2023-10-25 LAB — CBC WITH DIFFERENTIAL (CANCER CENTER ONLY)
Abs Immature Granulocytes: 0.02 K/uL (ref 0.00–0.07)
Basophils Absolute: 0 K/uL (ref 0.0–0.1)
Basophils Relative: 0 %
Eosinophils Absolute: 0.1 K/uL (ref 0.0–0.5)
Eosinophils Relative: 1 %
HCT: 36.5 % (ref 36.0–46.0)
Hemoglobin: 11.9 g/dL — ABNORMAL LOW (ref 12.0–15.0)
Immature Granulocytes: 0 %
Lymphocytes Relative: 25 %
Lymphs Abs: 2.2 K/uL (ref 0.7–4.0)
MCH: 31.6 pg (ref 26.0–34.0)
MCHC: 32.6 g/dL (ref 30.0–36.0)
MCV: 97.1 fL (ref 80.0–100.0)
Monocytes Absolute: 0.4 K/uL (ref 0.1–1.0)
Monocytes Relative: 5 %
Neutro Abs: 6.1 K/uL (ref 1.7–7.7)
Neutrophils Relative %: 69 %
Platelet Count: 212 K/uL (ref 150–400)
RBC: 3.76 MIL/uL — ABNORMAL LOW (ref 3.87–5.11)
RDW: 15.1 % (ref 11.5–15.5)
WBC Count: 8.8 K/uL (ref 4.0–10.5)
nRBC: 0 % (ref 0.0–0.2)

## 2023-10-25 LAB — RAD ONC ARIA SESSION SUMMARY
Course Elapsed Days: 9
Plan Fractions Treated to Date: 4
Plan Fractions Treated to Date: 7
Plan Prescribed Dose Per Fraction: 1.8 Gy
Plan Prescribed Dose Per Fraction: 1.8 Gy
Plan Total Fractions Prescribed: 14
Plan Total Fractions Prescribed: 28
Plan Total Prescribed Dose: 25.2 Gy
Plan Total Prescribed Dose: 50.4 Gy
Reference Point Dosage Given to Date: 12.6 Gy
Reference Point Dosage Given to Date: 7.2 Gy
Reference Point Session Dosage Given: 1.8 Gy
Reference Point Session Dosage Given: 1.8 Gy
Session Number: 7

## 2023-10-25 MED ORDER — ACETAMINOPHEN 325 MG PO TABS
650.0000 mg | ORAL_TABLET | Freq: Once | ORAL | Status: AC
  Administered 2023-10-25: 650 mg via ORAL
  Filled 2023-10-25: qty 2

## 2023-10-25 MED ORDER — SODIUM CHLORIDE 0.9 % IV SOLN: INTRAVENOUS | Status: DC

## 2023-10-25 MED ORDER — TRASTUZUMAB-ANNS CHEMO 150 MG IV SOLR
8.0000 mg/kg | Freq: Once | INTRAVENOUS | Status: AC
  Administered 2023-10-25: 600 mg via INTRAVENOUS
  Filled 2023-10-25: qty 28.57

## 2023-10-25 MED ORDER — DIPHENHYDRAMINE HCL 25 MG PO CAPS
25.0000 mg | ORAL_CAPSULE | Freq: Once | ORAL | Status: AC
  Administered 2023-10-25: 25 mg via ORAL
  Filled 2023-10-25: qty 1

## 2023-10-25 NOTE — Patient Instructions (Signed)
 [facility] - A DEPT OF [facility]  Discharge Instructions: Thank you for choosing [facility] to provide your oncology and hematology care.   If you have a lab appointment with the Cancer Center, please go directly to the Cancer Center and check in at the registration area.   Wear comfortable clothing and clothing appropriate for easy access to any Portacath or PICC line.   We strive to give you quality time with your provider. You may need to reschedule your appointment if you arrive late (15 or more minutes).  Arriving late affects you and other patients whose appointments are after yours.  Also, if you miss three or more appointments without notifying the office, you may be dismissed from the clinic at the provider's discretion.      For prescription refill requests, have your pharmacy contact our office and allow 72 hours for refills to be completed.    Today you received the following chemotherapy and/or immunotherapy agents: trastuzumab-anns      To help prevent nausea and vomiting after your treatment, we encourage you to take your nausea medication as directed.  BELOW ARE SYMPTOMS THAT SHOULD BE REPORTED IMMEDIATELY: *FEVER GREATER THAN 100.4 F (38 C) OR HIGHER *CHILLS OR SWEATING *NAUSEA AND VOMITING THAT IS NOT CONTROLLED WITH YOUR NAUSEA MEDICATION *UNUSUAL SHORTNESS OF BREATH *UNUSUAL BRUISING OR BLEEDING *URINARY PROBLEMS (pain or burning when urinating, or frequent urination) *BOWEL PROBLEMS (unusual diarrhea, constipation, pain near the anus) TENDERNESS IN MOUTH AND THROAT WITH OR WITHOUT PRESENCE OF ULCERS (sore throat, sores in mouth, or a toothache) UNUSUAL RASH, SWELLING OR PAIN  UNUSUAL VAGINAL DISCHARGE OR ITCHING   Items with * indicate a potential emergency and should be followed up as soon as possible or go to the Emergency Department if any problems should occur.  Please show the CHEMOTHERAPY ALERT CARD or  IMMUNOTHERAPY ALERT CARD at check-in to the Emergency Department and triage nurse.  Should you have questions after your visit or need to cancel or reschedule your appointment, please contact [facility] - A DEPT OF [facility]  Dept: [phone]  and follow the prompts.  Office hours are 8:00 a.m. to 4:30 p.m. Monday - Friday. Please note that voicemails left after 4:00 p.m. may not be returned until the following business day.  We are closed weekends and major holidays. You have access to a nurse at all times for urgent questions. Please call the main number to the clinic Dept: [phone] and follow the prompts.   For any non-urgent questions, you may also contact your provider using MyChart. We now offer e-Visits for anyone 73 and older to request care online for non-urgent symptoms. For details visit [email or web address].   Also download the MyChart app! Go to the app store, search "MyChart", open the app, select [facility], and log in with your MyChart username and password.

## 2023-10-25 NOTE — Progress Notes (Addendum)
 Per Dr. Gudena, okay to proceed with Kanjinti  today and will plan to switch to Kadcyla with the next cycle. Has been 6 weeks since last dose, therefore okay to reload Kanjinti  per Morna, NP.  Harlene Nasuti, PharmD Oncology Infusion Pharmacist 10/25/2023 12:28 PM

## 2023-10-25 NOTE — Progress Notes (Signed)
 [facility] Cancer Follow up:    Donna Annabella SAILOR, FNP [address] [place] [address]   DIAGNOSIS: Cancer Staging  Malignant neoplasm of overlapping sites of right breast in female, estrogen receptor negative (HCC) Staging form: Breast, AJCC 8th Edition - Clinical stage from 01/01/2023: Stage IIA (cT2, cN0, cM0, G3, ER-, PR-, HER2+) - Signed by Odean Potts, MD on 01/02/2023 Stage prefix: Initial diagnosis Method of lymph node assessment: Clinical Histologic grading system: 3 grade system - Pathologic stage from 09/19/2023: ypT1c, ypN0(sn), cM0, G2, ER-, PR-, HER2+ - Signed by Lanell Donald Stagger, PA-C on 09/19/2023 Stage prefix: Post-therapy Response to neoadjuvant therapy: Partial response Method of lymph node assessment: Sentinel lymph node biopsy Histologic grading system: 3 grade system  Malignant neoplasm of upper-outer quadrant of left breast in female, estrogen receptor positive (HCC) Staging form: Breast, AJCC 8th Edition - Clinical: Stage IIB (cT2, cN1(f), cM0, G3, ER+, PR+, HER2-) - Signed by Lanell Donald Stagger, PA-C on 01/01/2023 Method of lymph node assessment: Core biopsy Histologic grading system: 3 grade system - Pathologic stage from 09/19/2023: ypT2, ypN1b, cM0, G2, ER+, PR+, HER2- - Signed by Lanell Donald Stagger, PA-C on 09/19/2023 Stage prefix: Post-therapy Response to neoadjuvant therapy: Partial response Method of lymph node assessment: Other Multigene prognostic tests performed: None Histologic grading system: 3 grade system    SUMMARY OF ONCOLOGIC HISTORY: Oncology History  Malignant neoplasm of overlapping sites of right breast in female, estrogen receptor negative (HCC)  12/31/2022 Initial Diagnosis   Malignant neoplasm of overlapping sites of right breast in female, estrogen receptor negative (HCC)   01/01/2023 Cancer Staging   Staging form: Breast, AJCC 8th Edition - Clinical stage from 01/01/2023: Stage IIA (cT2,  cN0, cM0, G3, ER-, PR-, HER2+) - Signed by Odean Potts, MD on 01/02/2023 Stage prefix: Initial diagnosis Method of lymph node assessment: Clinical Histologic grading system: 3 grade system   01/25/2023 -  Chemotherapy   Patient is on Treatment Plan : BREAST  Docetaxel  + Carboplatin  + Trastuzumab  + Pertuzumab   (TCHP) q21d       Genetic Testing   [facility] CancerNext+RNA was Negative. Report date is 01/16/2023.   The [facility] CancerNext+RNAinsight Panel includes sequencing, rearrangement analysis, and RNA analysis for the following 39 genes: APC, ATM, BAP1, BARD1, BMPR1A, BRCA1, BRCA2, BRIP1, CDH1, CDKN2A, CHEK2, FH, FLCN, MET, MLH1, MSH2, MSH6, MUTYH, NF1, NTHL1, PALB2, PMS2, PTEN, RAD51C, RAD51D, SMAD4, STK11, TP53, TSC1, TSC2, and VHL (sequencing and deletion/duplication); AXIN2, HOXB13, MBD4, MSH3, POLD1 and POLE (sequencing only); EPCAM and GREM1 (deletion/duplication only).    09/19/2023 Cancer Staging   Staging form: Breast, AJCC 8th Edition - Pathologic stage from 09/19/2023: ypT1c, ypN0(sn), cM0, G2, ER-, PR-, HER2+ - Signed by Lanell Donald Stagger, PA-C on 09/19/2023 Stage prefix: Post-therapy Response to neoadjuvant therapy: Partial response Method of lymph node assessment: Sentinel lymph node biopsy Histologic grading system: 3 grade system   Malignant neoplasm of upper-outer quadrant of left breast in female, estrogen receptor positive (HCC)  12/31/2022 Initial Diagnosis   Malignant neoplasm of upper-outer quadrant of left breast in female, estrogen receptor positive (HCC)   01/01/2023 Cancer Staging   Staging form: Breast, AJCC 8th Edition - Clinical: Stage IIB (cT2, cN1(f), cM0, G3, ER+, PR+, HER2-) - Signed by Lanell Donald Stagger, PA-C on 01/01/2023 Method of lymph node assessment: Core biopsy Histologic grading system: 3 grade system   01/25/2023 -  Chemotherapy   Patient is on Treatment Plan : BREAST  Docetaxel  + Carboplatin  +  Trastuzumab  + Pertuzumab   (TCHP) q21d        Genetic Testing   [facility] CancerNext+RNA was Negative. Report date is 01/16/2023.   The [facility] CancerNext+RNAinsight Panel includes sequencing, rearrangement analysis, and RNA analysis for the following 39 genes: APC, ATM, BAP1, BARD1, BMPR1A, BRCA1, BRCA2, BRIP1, CDH1, CDKN2A, CHEK2, FH, FLCN, MET, MLH1, MSH2, MSH6, MUTYH, NF1, NTHL1, PALB2, PMS2, PTEN, RAD51C, RAD51D, SMAD4, STK11, TP53, TSC1, TSC2, and VHL (sequencing and deletion/duplication); AXIN2, HOXB13, MBD4, MSH3, POLD1 and POLE (sequencing only); EPCAM and GREM1 (deletion/duplication only).    09/19/2023 Cancer Staging   Staging form: Breast, AJCC 8th Edition - Pathologic stage from 09/19/2023: ypT2, ypN1b, cM0, G2, ER+, PR+, HER2- - Signed by Lanell Donald Stagger, PA-C on 09/19/2023 Stage prefix: Post-therapy Response to neoadjuvant therapy: Partial response Method of lymph node assessment: Other Multigene prognostic tests performed: None Histologic grading system: 3 grade system     CURRENT THERAPY:Herceptin   INTERVAL HISTORY:  Discussed the use of AI scribe software for clinical note transcription with the patient, who gave verbal consent to proceed.  History of Present Illness Donna [name] is a 73 year old female undergoing treatment for breast cancer who presents for Herceptin  infusion.  She is receiving Herceptin  infusions every three weeks and missed her last infusion due to scheduling conflicts. She is also undergoing radiation therapy and has had bilateral mastectomies. Her echocardiogram on September 17th, 2025, shows a left ventricular ejection fraction of 60-65% with normal global longitudinal strain.  She experiences constipation, managed with prunes, and occasional irritability. There are no new symptoms such as cough, shortness of breath, chest pain, or palpitations.  She has insomnia, for which she takes trazodone . She feels somewhat depressed following her surgery but copes by keeping herself  occupied.     Patient Active Problem List   Diagnosis Date Noted   ABLA (acute blood loss anemia) 08/22/2023   S/P mastectomy, bilateral 08/16/2023   Bilateral breast cancer (HCC) 08/16/2023   Port-A-Cath in place 01/24/2023   Genetic testing 01/17/2023   Malignant neoplasm of overlapping sites of right breast in female, estrogen receptor negative (HCC) 12/31/2022   Malignant neoplasm of upper-outer quadrant of left breast in female, estrogen receptor positive (HCC) 12/31/2022   Cough    Cigarette nicotine  dependence without complication    Stroke (HCC) 05/25/2020   Primary hypertension     has no known allergies.  MEDICAL HISTORY: Past Medical History:  Diagnosis Date   Allergy    Anxiety    Breast cancer (HCC)    Depression    Hyperlipidemia    no meds    Hypertension    Stroke (HCC) 05/2020   mild weakness on left side of body    SURGICAL HISTORY: Past Surgical History:  Procedure Laterality Date   AXILLARY LYMPH NODE DISSECTION Left 08/16/2023   Procedure: REDGIE HARD;  Surgeon: Curvin Deward MOULD, MD;  Location: [facility] OR;  Service: General;  Laterality: Left;   BREAST BIOPSY Left 12/21/2022   US  LT BREAST BX W LOC DEV 1ST LESION IMG BX SPEC US  GUIDE 12/21/2022 [facility]   BREAST BIOPSY Left 12/21/2022   US  LT BREAST BX W LOC DEV EA ADD LESION IMG BX SPEC US  GUIDE 12/21/2022 [facility]   BREAST BIOPSY Right 12/26/2022   US  RT BREAST BX W LOC DEV 1ST LESION IMG BX SPEC US  GUIDE 12/26/2022 [facility]   BREAST BIOPSY Right 12/26/2022   US  RT BREAST BX W LOC DEV EA ADD LESION IMG  BX SPEC US  GUIDE 12/26/2022 [facility]   BREAST BIOPSY Right 12/26/2022   MM RT BREAST BX W LOC DEV 1ST LESION IMAGE BX SPEC STEREO GUIDE 12/26/2022 [facility]   BREAST BIOPSY Left 08/15/2023   US  LT RADIOACTIVE SEED LOC 08/15/2023 [facility]   DENTAL SURGERY     INCISION AND DRAINAGE OF WOUND Right 08/21/2023   Procedure: IRRIGATION AND  DEBRIDEMENT WOUND;  Surgeon: Curvin Deward MOULD, MD;  Location: [facility] OR;  Service: General;  Laterality: Right;  WOUND WASHOUT AND CLOSURE   IR IMAGING GUIDED PORT INSERTION  01/22/2023   MASTECTOMY W/ SENTINEL NODE BIOPSY Bilateral 08/16/2023   Procedure: MASTECTOMY WITH SENTINEL LYMPH NODE BIOPSY;  Surgeon: Curvin Deward MOULD, MD;  Location: [facility] OR;  Service: General;  Laterality: Bilateral;  GEN w/PEC BLOCK LEFT MASTY WITH SENTINEL NODE AND TARGETED NODE DISSECTION RIGHT MASTECTOMY WITH SENTINEL NODE   NSVD     x2   TUBAL LIGATION  1976    SOCIAL HISTORY: Social History   Socioeconomic History   Marital status: Divorced    Spouse name: Not on file   Number of children: Not on file   Years of education: Not on file   Highest education level: Not on file  Occupational History   Not on file  Tobacco Use   Smoking status: Every Day    Types: Cigarettes   Smokeless tobacco: Never   Tobacco comments:    8 cigs a day as of 08/13/2023  Vaping Use   Vaping status: Never Used  Substance and Sexual Activity   Alcohol use: Yes    Comment: socially   Drug use: No   Sexual activity: Not Currently  Other Topics Concern   Not on file  Social History Narrative   Not on file   Social Drivers of Health   Financial Resource Strain: Not at Risk (04/04/2022)   Received from [employer]    Financial Resource Strain: 1  Food Insecurity: No Food Insecurity (08/16/2023)   Hunger Vital Sign    Worried About Running Out of Food in the Last Year: Never true    Ran Out of Food in the Last Year: Never true  Transportation Needs: No Transportation Needs (08/16/2023)   PRAPARE - Administrator, Civil Service (Medical): No    Lack of Transportation (Non-Medical): No  Physical Activity: Not on File (05/04/2021)   Received from [facility]   Physical Activity    Physical Activity: 0  Stress: Not on File (05/04/2021)   Received from [facility]   Stress    Stress: 0  Social Connections:  Moderately Integrated (08/16/2023)   Social Connection and Isolation Panel    Frequency of Communication with Friends and Family: More than three times a week    Frequency of Social Gatherings with Friends and Family: Three times a week    Attends Religious Services: 1 to 4 times per year    Active Member of Clubs or Organizations: Yes    Attends Banker Meetings: 1 to 4 times per year    Marital Status: Widowed  Intimate Partner Violence: Not At Risk (08/16/2023)   Humiliation, Afraid, Rape, and Kick questionnaire    Fear of Current or Ex-Partner: No    Emotionally Abused: No    Physically Abused: No    Sexually Abused: No    FAMILY HISTORY: Family History  Problem Relation Age of Onset   Colon cancer Mother 56 -  71   Cancer Father 24 - 75       unknown type   Colon polyps Sister    Esophageal cancer Sister 40 - 66   Lung cancer Sister 44 - 79   Ovarian cancer Sister 27   Colon cancer Brother 85   Prostate cancer Brother    Breast cancer Niece        dx. <50   Rectal cancer Neg Hx    Stomach cancer Neg Hx    Pancreatic cancer Neg Hx     Review of Systems  Constitutional:  Negative for appetite change, chills, fatigue, fever and unexpected weight change.  HENT:   Negative for hearing loss, lump/mass and trouble swallowing.   Eyes:  Negative for eye problems and icterus.  Respiratory:  Negative for chest tightness, cough and shortness of breath.   Cardiovascular:  Negative for chest pain, leg swelling and palpitations.  Gastrointestinal:  Positive for constipation. Negative for abdominal distention, abdominal pain, diarrhea, nausea and vomiting.  Endocrine: Negative for hot flashes.  Genitourinary:  Negative for difficulty urinating.   Musculoskeletal:  Negative for arthralgias.  Skin:  Negative for itching and rash.  Neurological:  Negative for dizziness, extremity weakness, headaches and numbness.  Hematological:  Negative for adenopathy. Does not bruise/bleed  easily.  Psychiatric/Behavioral:  Negative for depression. The patient is not nervous/anxious.       PHYSICAL EXAMINATION   Onc Performance Status - 10/25/23 1141       ECOG Perf Status   ECOG Perf Status Restricted in physically strenuous activity but ambulatory and able to carry out work of a light or sedentary nature, e.g., light house work, office work      KPS SCALE   KPS % SCORE Able to carry on normal activity, minor s/s of disease          Vitals:   10/25/23 1139  BP: (!) 144/60  Pulse: 78  Resp: 18  Temp: (!) 97.2 F (36.2 C)  SpO2: 95%    Physical Exam Constitutional:      General: She is not in acute distress.    Appearance: Normal appearance. She is not toxic-appearing.  HENT:     Head: Normocephalic and atraumatic.     Mouth/Throat:     Mouth: Mucous membranes are moist.     Pharynx: Oropharynx is clear. No oropharyngeal exudate or posterior oropharyngeal erythema.  Eyes:     General: No scleral icterus. Cardiovascular:     Rate and Rhythm: Normal rate and regular rhythm.     Pulses: Normal pulses.     Heart sounds: Normal heart sounds.  Pulmonary:     Effort: Pulmonary effort is normal.     Breath sounds: Normal breath sounds.  Abdominal:     General: Abdomen is flat. Bowel sounds are normal. There is no distension.     Palpations: Abdomen is soft.     Tenderness: There is no abdominal tenderness.  Musculoskeletal:        General: No swelling.     Cervical back: Neck supple.  Lymphadenopathy:     Cervical: No cervical adenopathy.  Skin:    General: Skin is warm and dry.     Findings: No rash.  Neurological:     General: No focal deficit present.     Mental Status: She is alert.  Psychiatric:        Mood and Affect: Mood normal.        Behavior: Behavior normal.  LABORATORY DATA:  CBC    Component Value Date/Time   WBC 8.8 10/25/2023 1020   WBC 13.5 (H) 08/23/2023 0416   RBC 3.76 (L) 10/25/2023 1020   HGB 11.9 (L) 10/25/2023  1020   HCT 36.5 10/25/2023 1020   PLT 212 10/25/2023 1020   MCV 97.1 10/25/2023 1020   MCH 31.6 10/25/2023 1020   MCHC 32.6 10/25/2023 1020   RDW 15.1 10/25/2023 1020   LYMPHSABS 2.2 10/25/2023 1020   MONOABS 0.4 10/25/2023 1020   EOSABS 0.1 10/25/2023 1020   BASOSABS 0.0 10/25/2023 1020    CMP     Component Value Date/Time   NA 143 10/25/2023 1020   K 3.6 10/25/2023 1020   CL 113 (H) 10/25/2023 1020   CO2 27 10/25/2023 1020   GLUCOSE 97 10/25/2023 1020   BUN 13 10/25/2023 1020   CREATININE 0.70 10/25/2023 1020   CALCIUM  9.5 10/25/2023 1020   PROT 5.9 (L) 10/25/2023 1020   ALBUMIN  3.6 10/25/2023 1020   AST 19 10/25/2023 1020   ALT 11 10/25/2023 1020   ALKPHOS 94 10/25/2023 1020   BILITOT 0.3 10/25/2023 1020   GFRNONAA >60 10/25/2023 1020     ASSESSMENT and THERAPY PLAN:   No problem-specific Assessment & Plan notes found for this encounter.  Assessment and Plan Assessment & Plan HER2-positive breast cancer Undergoing adjuvant Herceptin  and radiation therapy. Echocardiogram showed LVEF 60-65% with normal strain, indicating stable cardiac function. - Administer Herceptin  infusion today. - to start Kadcyla with next cycle.    Insomnia Insomnia possibly related to Herceptin  treatment. - Continue trazodone  as prescribed.  Depression Depression post-surgery managed with personal strategies.   RTC in 3 weeks for labs, f/u and Kadcyla.     All questions were answered. The patient knows to call the clinic with any problems, questions or concerns. We can certainly see the patient much sooner if necessary.  Total encounter time:20 minutes*in face-to-face visit time, chart review, lab review, care coordination, order entry, and documentation of the encounter time.    Morna Kendall, NP 10/25/23 11:47 AM Medical Oncology and Hematology [facility] [address] [place], [place] [address] Tel. [phone]    Fax. [phone]  *Total Encounter  Time as defined by the Centers for Medicare and Medicaid Services includes, in addition to the face-to-face time of a patient visit (documented in the note above) non-face-to-face time: obtaining and reviewing outside history, ordering and reviewing medications, tests or procedures, care coordination (communications with other health care professionals or caregivers) and documentation in the medical record.

## 2023-10-27 ENCOUNTER — Encounter: Payer: Self-pay | Admitting: Hematology and Oncology

## 2023-10-28 ENCOUNTER — Other Ambulatory Visit: Payer: Self-pay

## 2023-10-28 ENCOUNTER — Other Ambulatory Visit: Payer: Self-pay | Admitting: Hematology and Oncology

## 2023-10-28 ENCOUNTER — Ambulatory Visit
Admission: RE | Admit: 2023-10-28 | Discharge: 2023-10-28 | Disposition: A | Source: Ambulatory Visit | Attending: Radiation Oncology

## 2023-10-28 DIAGNOSIS — Z5112 Encounter for antineoplastic immunotherapy: Secondary | ICD-10-CM | POA: Diagnosis not present

## 2023-10-28 DIAGNOSIS — Z170 Estrogen receptor positive status [ER+]: Secondary | ICD-10-CM

## 2023-10-28 DIAGNOSIS — C50811 Malignant neoplasm of overlapping sites of right female breast: Secondary | ICD-10-CM

## 2023-10-28 LAB — RAD ONC ARIA SESSION SUMMARY
Course Elapsed Days: 12
Plan Fractions Treated to Date: 4
Plan Fractions Treated to Date: 8
Plan Prescribed Dose Per Fraction: 1.8 Gy
Plan Prescribed Dose Per Fraction: 1.8 Gy
Plan Total Fractions Prescribed: 14
Plan Total Fractions Prescribed: 28
Plan Total Prescribed Dose: 25.2 Gy
Plan Total Prescribed Dose: 50.4 Gy
Reference Point Dosage Given to Date: 14.4 Gy
Reference Point Dosage Given to Date: 7.2 Gy
Reference Point Session Dosage Given: 1.8 Gy
Reference Point Session Dosage Given: 1.8 Gy
Session Number: 8

## 2023-10-28 NOTE — Progress Notes (Signed)
 DISCONTINUE ON PATHWAY REGIMEN - Breast     Cycle 1: A cycle is 21 days:     Pertuzumab       Trastuzumab -xxxx      Docetaxel       Carboplatin     Cycles 2 through 6: A cycle is every 21 days:     Pertuzumab       Trastuzumab -xxxx      Docetaxel       Carboplatin    **Always confirm dose/schedule in your pharmacy ordering system**  PRIOR TREATMENT: [id number]: Docetaxel  + Carboplatin  + Trastuzumab  IV + Pertuzumab  IV (TCHP IV) q21 Days x 6 Cycles  START ON PATHWAY REGIMEN - Breast     A cycle is every 21 days:     Ado-trastuzumab  emtansine   **Always confirm dose/schedule in your pharmacy ordering system**  Patient Characteristics: Post-Neoadjuvant Therapy and Resection, M0, HER2 Positive, ER Negative, Residual Disease, Adjuvant Targeted Therapy After Neoadjuvant Chemo/Targeted Therapy Therapeutic Status: Post-Neoadjuvant Therapy and Resection, M0 Residual Invasive Disease Post-Neoadjuvant Therapy<= Yes ER Status: Negative (-) HER2 Status: Positive (+) PR Status: Negative (-) Intent of Therapy: Curative Intent, Discussed with Patient

## 2023-10-29 ENCOUNTER — Ambulatory Visit
Admission: RE | Admit: 2023-10-29 | Discharge: 2023-10-29 | Disposition: A | Discharge status: Home / Self Care | Source: Ambulatory Visit | Attending: Radiation Oncology | Admitting: Radiation Oncology

## 2023-10-29 ENCOUNTER — Other Ambulatory Visit: Payer: Self-pay

## 2023-10-29 DIAGNOSIS — Z5112 Encounter for antineoplastic immunotherapy: Secondary | ICD-10-CM | POA: Diagnosis not present

## 2023-10-29 LAB — RAD ONC ARIA SESSION SUMMARY
Course Elapsed Days: 13
Plan Fractions Treated to Date: 5
Plan Fractions Treated to Date: 9
Plan Prescribed Dose Per Fraction: 1.8 Gy
Plan Prescribed Dose Per Fraction: 1.8 Gy
Plan Total Fractions Prescribed: 14
Plan Total Fractions Prescribed: 28
Plan Total Prescribed Dose: 25.2 Gy
Plan Total Prescribed Dose: 50.4 Gy
Reference Point Dosage Given to Date: 16.2 Gy
Reference Point Dosage Given to Date: 9 Gy
Reference Point Session Dosage Given: 1.8 Gy
Reference Point Session Dosage Given: 1.8 Gy
Session Number: 9

## 2023-10-30 ENCOUNTER — Other Ambulatory Visit: Payer: Self-pay

## 2023-10-30 ENCOUNTER — Ambulatory Visit
Admission: RE | Admit: 2023-10-30 | Discharge: 2023-10-30 | Disposition: A | Source: Ambulatory Visit | Attending: Radiation Oncology

## 2023-10-30 DIAGNOSIS — Z5112 Encounter for antineoplastic immunotherapy: Secondary | ICD-10-CM | POA: Diagnosis not present

## 2023-10-30 LAB — RAD ONC ARIA SESSION SUMMARY
Course Elapsed Days: 14
Plan Fractions Treated to Date: 10
Plan Fractions Treated to Date: 5
Plan Prescribed Dose Per Fraction: 1.8 Gy
Plan Prescribed Dose Per Fraction: 1.8 Gy
Plan Total Fractions Prescribed: 14
Plan Total Fractions Prescribed: 28
Plan Total Prescribed Dose: 25.2 Gy
Plan Total Prescribed Dose: 50.4 Gy
Reference Point Dosage Given to Date: 18 Gy
Reference Point Dosage Given to Date: 9 Gy
Reference Point Session Dosage Given: 1.8 Gy
Reference Point Session Dosage Given: 1.8 Gy
Session Number: 10

## 2023-10-31 ENCOUNTER — Ambulatory Visit
Admission: RE | Admit: 2023-10-31 | Discharge: 2023-10-31 | Disposition: A | Discharge status: Home / Self Care | Source: Ambulatory Visit | Attending: Radiation Oncology | Admitting: Radiation Oncology

## 2023-10-31 ENCOUNTER — Other Ambulatory Visit: Payer: Self-pay

## 2023-10-31 DIAGNOSIS — Z5112 Encounter for antineoplastic immunotherapy: Secondary | ICD-10-CM | POA: Diagnosis not present

## 2023-10-31 LAB — RAD ONC ARIA SESSION SUMMARY
Course Elapsed Days: 15
Plan Fractions Treated to Date: 11
Plan Fractions Treated to Date: 6
Plan Prescribed Dose Per Fraction: 1.8 Gy
Plan Prescribed Dose Per Fraction: 1.8 Gy
Plan Total Fractions Prescribed: 14
Plan Total Fractions Prescribed: 28
Plan Total Prescribed Dose: 25.2 Gy
Plan Total Prescribed Dose: 50.4 Gy
Reference Point Dosage Given to Date: 10.8 Gy
Reference Point Dosage Given to Date: 19.8 Gy
Reference Point Session Dosage Given: 1.8 Gy
Reference Point Session Dosage Given: 1.8 Gy
Session Number: 11

## 2023-11-01 ENCOUNTER — Ambulatory Visit
Admission: RE | Admit: 2023-11-01 | Discharge: 2023-11-01 | Disposition: A | Discharge status: Home / Self Care | Source: Ambulatory Visit | Attending: Radiation Oncology | Admitting: Radiation Oncology

## 2023-11-01 ENCOUNTER — Other Ambulatory Visit: Payer: Self-pay

## 2023-11-01 DIAGNOSIS — Z5112 Encounter for antineoplastic immunotherapy: Secondary | ICD-10-CM | POA: Diagnosis not present

## 2023-11-01 LAB — RAD ONC ARIA SESSION SUMMARY
Course Elapsed Days: 16
Plan Fractions Treated to Date: 12
Plan Fractions Treated to Date: 6
Plan Prescribed Dose Per Fraction: 1.8 Gy
Plan Prescribed Dose Per Fraction: 1.8 Gy
Plan Total Fractions Prescribed: 14
Plan Total Fractions Prescribed: 28
Plan Total Prescribed Dose: 25.2 Gy
Plan Total Prescribed Dose: 50.4 Gy
Reference Point Dosage Given to Date: 10.8 Gy
Reference Point Dosage Given to Date: 21.6 Gy
Reference Point Session Dosage Given: 1.8 Gy
Reference Point Session Dosage Given: 1.8 Gy
Session Number: 12

## 2023-11-04 ENCOUNTER — Other Ambulatory Visit: Payer: Self-pay

## 2023-11-04 ENCOUNTER — Ambulatory Visit
Admission: RE | Admit: 2023-11-04 | Discharge: 2023-11-04 | Disposition: A | Discharge status: Home / Self Care | Source: Ambulatory Visit | Attending: Radiation Oncology | Admitting: Radiation Oncology

## 2023-11-04 DIAGNOSIS — Z5112 Encounter for antineoplastic immunotherapy: Secondary | ICD-10-CM | POA: Diagnosis not present

## 2023-11-04 LAB — RAD ONC ARIA SESSION SUMMARY
Course Elapsed Days: 19
Plan Fractions Treated to Date: 13
Plan Fractions Treated to Date: 7
Plan Prescribed Dose Per Fraction: 1.8 Gy
Plan Prescribed Dose Per Fraction: 1.8 Gy
Plan Total Fractions Prescribed: 14
Plan Total Fractions Prescribed: 28
Plan Total Prescribed Dose: 25.2 Gy
Plan Total Prescribed Dose: 50.4 Gy
Reference Point Dosage Given to Date: 12.6 Gy
Reference Point Dosage Given to Date: 23.4 Gy
Reference Point Session Dosage Given: 1.8 Gy
Reference Point Session Dosage Given: 1.8 Gy
Session Number: 13

## 2023-11-05 ENCOUNTER — Other Ambulatory Visit: Payer: Self-pay

## 2023-11-05 ENCOUNTER — Ambulatory Visit
Admission: RE | Admit: 2023-11-05 | Discharge: 2023-11-05 | Disposition: A | Discharge status: Home / Self Care | Source: Ambulatory Visit | Attending: Radiation Oncology | Admitting: Radiation Oncology

## 2023-11-05 ENCOUNTER — Other Ambulatory Visit: Payer: Self-pay | Admitting: Hematology and Oncology

## 2023-11-05 DIAGNOSIS — Z5112 Encounter for antineoplastic immunotherapy: Secondary | ICD-10-CM | POA: Diagnosis not present

## 2023-11-05 LAB — RAD ONC ARIA SESSION SUMMARY
Course Elapsed Days: 20
Plan Fractions Treated to Date: 14
Plan Fractions Treated to Date: 7
Plan Prescribed Dose Per Fraction: 1.8 Gy
Plan Prescribed Dose Per Fraction: 1.8 Gy
Plan Total Fractions Prescribed: 14
Plan Total Fractions Prescribed: 28
Plan Total Prescribed Dose: 25.2 Gy
Plan Total Prescribed Dose: 50.4 Gy
Reference Point Dosage Given to Date: 12.6 Gy
Reference Point Dosage Given to Date: 25.2 Gy
Reference Point Session Dosage Given: 1.8 Gy
Reference Point Session Dosage Given: 1.8 Gy
Session Number: 14

## 2023-11-06 ENCOUNTER — Other Ambulatory Visit: Payer: Self-pay

## 2023-11-06 ENCOUNTER — Ambulatory Visit
Admission: RE | Admit: 2023-11-06 | Discharge: 2023-11-06 | Disposition: A | Source: Ambulatory Visit | Attending: Radiation Oncology

## 2023-11-06 DIAGNOSIS — Z5112 Encounter for antineoplastic immunotherapy: Secondary | ICD-10-CM | POA: Diagnosis not present

## 2023-11-06 LAB — RAD ONC ARIA SESSION SUMMARY
Course Elapsed Days: 21
Plan Fractions Treated to Date: 15
Plan Fractions Treated to Date: 8
Plan Prescribed Dose Per Fraction: 1.8 Gy
Plan Prescribed Dose Per Fraction: 1.8 Gy
Plan Total Fractions Prescribed: 14
Plan Total Fractions Prescribed: 28
Plan Total Prescribed Dose: 25.2 Gy
Plan Total Prescribed Dose: 50.4 Gy
Reference Point Dosage Given to Date: 14.4 Gy
Reference Point Dosage Given to Date: 27 Gy
Reference Point Session Dosage Given: 1.8 Gy
Reference Point Session Dosage Given: 1.8 Gy
Session Number: 15

## 2023-11-07 ENCOUNTER — Ambulatory Visit
Admission: RE | Admit: 2023-11-07 | Discharge: 2023-11-07 | Disposition: A | Source: Ambulatory Visit | Attending: Radiation Oncology

## 2023-11-07 ENCOUNTER — Encounter: Payer: Self-pay | Admitting: *Deleted

## 2023-11-07 ENCOUNTER — Other Ambulatory Visit: Payer: Self-pay

## 2023-11-07 DIAGNOSIS — Z5112 Encounter for antineoplastic immunotherapy: Secondary | ICD-10-CM | POA: Diagnosis not present

## 2023-11-07 LAB — RAD ONC ARIA SESSION SUMMARY
Course Elapsed Days: 22
Plan Fractions Treated to Date: 8
Plan Prescribed Dose Per Fraction: 1.8 Gy
Plan Total Fractions Prescribed: 14
Plan Total Prescribed Dose: 25.2 Gy
Reference Point Dosage Given to Date: 14.4 Gy
Reference Point Session Dosage Given: 0.3861 Gy
Session Number: 16

## 2023-11-08 ENCOUNTER — Ambulatory Visit
Admission: RE | Admit: 2023-11-08 | Discharge: 2023-11-08 | Disposition: A | Discharge status: Home / Self Care | Source: Ambulatory Visit | Attending: Radiation Oncology | Admitting: Radiation Oncology

## 2023-11-08 ENCOUNTER — Other Ambulatory Visit: Payer: Self-pay

## 2023-11-08 DIAGNOSIS — Z5112 Encounter for antineoplastic immunotherapy: Secondary | ICD-10-CM | POA: Diagnosis not present

## 2023-11-08 LAB — RAD ONC ARIA SESSION SUMMARY
Course Elapsed Days: 23
Plan Fractions Treated to Date: 17
Plan Fractions Treated to Date: 9
Plan Prescribed Dose Per Fraction: 1.8 Gy
Plan Prescribed Dose Per Fraction: 1.8 Gy
Plan Total Fractions Prescribed: 14
Plan Total Fractions Prescribed: 28
Plan Total Prescribed Dose: 25.2 Gy
Plan Total Prescribed Dose: 50.4 Gy
Reference Point Dosage Given to Date: 16.2 Gy
Reference Point Dosage Given to Date: 30.6 Gy
Reference Point Session Dosage Given: 1.8 Gy
Reference Point Session Dosage Given: 1.8 Gy
Session Number: 17

## 2023-11-08 NOTE — Progress Notes (Signed)
 Pharmacist Chemotherapy Monitoring - Initial Assessment    Anticipated start date: 11/15/23   The following has been reviewed per standard work regarding the patient's treatment regimen: The patient's diagnosis, treatment plan and drug doses, and organ/hematologic function Lab orders and baseline tests specific to treatment regimen  The treatment plan start date, drug sequencing, and pre-medications Prior authorization status  Patient's documented medication list, including drug-drug interaction screen and prescriptions for anti-emetics and supportive care specific to the treatment regimen The drug concentrations, fluid compatibility, administration routes, and timing of the medications to be used The patient's access for treatment and lifetime cumulative dose history, if applicable  The patient's medication allergies and previous infusion related reactions, if applicable   Changes made to treatment plan:  N/A  Follow up needed:  Pending authorization for treatment    Wilma Dollar, Pharm.D., CPP 11/08/2023@2 :28 PM

## 2023-11-11 ENCOUNTER — Other Ambulatory Visit: Payer: Self-pay

## 2023-11-11 ENCOUNTER — Ambulatory Visit
Admission: RE | Admit: 2023-11-11 | Discharge: 2023-11-11 | Disposition: A | Source: Ambulatory Visit | Attending: Radiation Oncology

## 2023-11-11 DIAGNOSIS — Z5112 Encounter for antineoplastic immunotherapy: Secondary | ICD-10-CM | POA: Diagnosis not present

## 2023-11-11 LAB — RAD ONC ARIA SESSION SUMMARY
Course Elapsed Days: 26
Plan Fractions Treated to Date: 18
Plan Fractions Treated to Date: 9
Plan Prescribed Dose Per Fraction: 1.8 Gy
Plan Prescribed Dose Per Fraction: 1.8 Gy
Plan Total Fractions Prescribed: 14
Plan Total Fractions Prescribed: 28
Plan Total Prescribed Dose: 25.2 Gy
Plan Total Prescribed Dose: 50.4 Gy
Reference Point Dosage Given to Date: 16.2 Gy
Reference Point Dosage Given to Date: 32.4 Gy
Reference Point Session Dosage Given: 1.8 Gy
Reference Point Session Dosage Given: 1.8 Gy
Session Number: 18

## 2023-11-12 ENCOUNTER — Ambulatory Visit

## 2023-11-13 ENCOUNTER — Ambulatory Visit
Admission: RE | Admit: 2023-11-13 | Discharge: 2023-11-13 | Disposition: A | Source: Ambulatory Visit | Attending: Radiation Oncology

## 2023-11-13 ENCOUNTER — Other Ambulatory Visit: Payer: Self-pay

## 2023-11-13 DIAGNOSIS — Z5112 Encounter for antineoplastic immunotherapy: Secondary | ICD-10-CM | POA: Diagnosis not present

## 2023-11-13 LAB — RAD ONC ARIA SESSION SUMMARY
Course Elapsed Days: 28
Plan Fractions Treated to Date: 10
Plan Fractions Treated to Date: 19
Plan Prescribed Dose Per Fraction: 1.8 Gy
Plan Prescribed Dose Per Fraction: 1.8 Gy
Plan Total Fractions Prescribed: 14
Plan Total Fractions Prescribed: 28
Plan Total Prescribed Dose: 25.2 Gy
Plan Total Prescribed Dose: 50.4 Gy
Reference Point Dosage Given to Date: 18 Gy
Reference Point Dosage Given to Date: 34.2 Gy
Reference Point Session Dosage Given: 1.8 Gy
Reference Point Session Dosage Given: 1.8 Gy
Session Number: 19

## 2023-11-14 ENCOUNTER — Ambulatory Visit
Admission: RE | Admit: 2023-11-14 | Discharge: 2023-11-14 | Disposition: A | Discharge status: Home / Self Care | Source: Ambulatory Visit | Attending: Radiation Oncology | Admitting: Radiation Oncology

## 2023-11-14 ENCOUNTER — Other Ambulatory Visit: Payer: Self-pay

## 2023-11-14 DIAGNOSIS — Z5112 Encounter for antineoplastic immunotherapy: Secondary | ICD-10-CM | POA: Diagnosis not present

## 2023-11-14 LAB — RAD ONC ARIA SESSION SUMMARY
Course Elapsed Days: 29
Plan Fractions Treated to Date: 10
Plan Fractions Treated to Date: 20
Plan Prescribed Dose Per Fraction: 1.8 Gy
Plan Prescribed Dose Per Fraction: 1.8 Gy
Plan Total Fractions Prescribed: 14
Plan Total Fractions Prescribed: 28
Plan Total Prescribed Dose: 25.2 Gy
Plan Total Prescribed Dose: 50.4 Gy
Reference Point Dosage Given to Date: 18 Gy
Reference Point Dosage Given to Date: 36 Gy
Reference Point Session Dosage Given: 1.8 Gy
Reference Point Session Dosage Given: 1.8 Gy
Session Number: 20

## 2023-11-15 ENCOUNTER — Encounter: Payer: Self-pay | Admitting: Hematology and Oncology

## 2023-11-15 ENCOUNTER — Inpatient Hospital Stay (HOSPITAL_BASED_OUTPATIENT_CLINIC_OR_DEPARTMENT_OTHER): Admitting: Adult Health

## 2023-11-15 ENCOUNTER — Other Ambulatory Visit: Payer: Self-pay

## 2023-11-15 ENCOUNTER — Ambulatory Visit
Admission: RE | Admit: 2023-11-15 | Discharge: 2023-11-15 | Disposition: A | Discharge status: Home / Self Care | Source: Ambulatory Visit | Attending: Radiation Oncology | Admitting: Radiation Oncology

## 2023-11-15 ENCOUNTER — Encounter: Payer: Self-pay | Admitting: Adult Health

## 2023-11-15 ENCOUNTER — Inpatient Hospital Stay

## 2023-11-15 VITALS — BP 135/50 | HR 78 | Temp 98.2°F | Resp 16

## 2023-11-15 VITALS — BP 134/59 | HR 73 | Temp 97.9°F | Resp 17 | Ht 69.0 in | Wt 159.5 lb

## 2023-11-15 DIAGNOSIS — Z170 Estrogen receptor positive status [ER+]: Secondary | ICD-10-CM | POA: Diagnosis not present

## 2023-11-15 DIAGNOSIS — Z171 Estrogen receptor negative status [ER-]: Secondary | ICD-10-CM

## 2023-11-15 DIAGNOSIS — C50412 Malignant neoplasm of upper-outer quadrant of left female breast: Secondary | ICD-10-CM

## 2023-11-15 DIAGNOSIS — G4452 New daily persistent headache (NDPH): Secondary | ICD-10-CM

## 2023-11-15 DIAGNOSIS — C50811 Malignant neoplasm of overlapping sites of right female breast: Secondary | ICD-10-CM

## 2023-11-15 DIAGNOSIS — Z5112 Encounter for antineoplastic immunotherapy: Secondary | ICD-10-CM | POA: Diagnosis not present

## 2023-11-15 LAB — RAD ONC ARIA SESSION SUMMARY
Course Elapsed Days: 30
Plan Fractions Treated to Date: 11
Plan Fractions Treated to Date: 21
Plan Prescribed Dose Per Fraction: 1.8 Gy
Plan Prescribed Dose Per Fraction: 1.8 Gy
Plan Total Fractions Prescribed: 14
Plan Total Fractions Prescribed: 28
Plan Total Prescribed Dose: 25.2 Gy
Plan Total Prescribed Dose: 50.4 Gy
Reference Point Dosage Given to Date: 19.8 Gy
Reference Point Dosage Given to Date: 37.8 Gy
Reference Point Session Dosage Given: 1.8 Gy
Reference Point Session Dosage Given: 1.8 Gy
Session Number: 21

## 2023-11-15 LAB — CBC WITH DIFFERENTIAL (CANCER CENTER ONLY)
Abs Immature Granulocytes: 0.03 K/uL (ref 0.00–0.07)
Basophils Absolute: 0 K/uL (ref 0.0–0.1)
Basophils Relative: 0 %
Eosinophils Absolute: 0.1 K/uL (ref 0.0–0.5)
Eosinophils Relative: 1 %
HCT: 37.6 % (ref 36.0–46.0)
Hemoglobin: 12.3 g/dL (ref 12.0–15.0)
Immature Granulocytes: 0 %
Lymphocytes Relative: 14 %
Lymphs Abs: 1.2 K/uL (ref 0.7–4.0)
MCH: 31.5 pg (ref 26.0–34.0)
MCHC: 32.7 g/dL (ref 30.0–36.0)
MCV: 96.4 fL (ref 80.0–100.0)
Monocytes Absolute: 0.5 K/uL (ref 0.1–1.0)
Monocytes Relative: 6 %
Neutro Abs: 6.4 K/uL (ref 1.7–7.7)
Neutrophils Relative %: 79 %
Platelet Count: 210 K/uL (ref 150–400)
RBC: 3.9 MIL/uL (ref 3.87–5.11)
RDW: 14.6 % (ref 11.5–15.5)
WBC Count: 8.2 K/uL (ref 4.0–10.5)
nRBC: 0 % (ref 0.0–0.2)

## 2023-11-15 LAB — CMP (CANCER CENTER ONLY)
ALT: 14 U/L (ref 0–44)
AST: 19 U/L (ref 15–41)
Albumin: 3.7 g/dL (ref 3.5–5.0)
Alkaline Phosphatase: 87 U/L (ref 38–126)
Anion gap: 4 — ABNORMAL LOW (ref 5–15)
BUN: 18 mg/dL (ref 8–23)
CO2: 28 mmol/L (ref 22–32)
Calcium: 9.2 mg/dL (ref 8.9–10.3)
Chloride: 111 mmol/L (ref 98–111)
Creatinine: 0.77 mg/dL (ref 0.44–1.00)
GFR, Estimated: >60 mL/min (ref >60)
Glucose, Bld: 94 mg/dL (ref 70–99)
Potassium: 3.7 mmol/L (ref 3.5–5.1)
Sodium: 143 mmol/L (ref 135–145)
Total Bilirubin: 0.3 mg/dL (ref 0.0–1.2)
Total Protein: 6.2 g/dL — ABNORMAL LOW (ref 6.5–8.1)

## 2023-11-15 MED ORDER — AZITHROMYCIN 250 MG PO TABS: ORAL_TABLET | ORAL | 0 refills | Status: DC

## 2023-11-15 MED ORDER — PROCHLORPERAZINE MALEATE 10 MG PO TABS: 10.0000 mg | ORAL_TABLET | Freq: Four times a day (QID) | ORAL | 0 refills | Status: AC | PRN

## 2023-11-15 MED ORDER — DIPHENHYDRAMINE HCL 25 MG PO CAPS
25.0000 mg | ORAL_CAPSULE | Freq: Once | ORAL | Status: AC
  Administered 2023-11-15: 25 mg via ORAL
  Filled 2023-11-15: qty 1

## 2023-11-15 MED ORDER — SODIUM CHLORIDE 0.9 % IV SOLN: INTRAVENOUS | Status: DC

## 2023-11-15 MED ORDER — ACETAMINOPHEN 325 MG PO TABS
650.0000 mg | ORAL_TABLET | Freq: Once | ORAL | Status: AC
  Administered 2023-11-15: 650 mg via ORAL
  Filled 2023-11-15: qty 2

## 2023-11-15 MED ORDER — PROCHLORPERAZINE MALEATE 10 MG PO TABS
10.0000 mg | ORAL_TABLET | Freq: Once | ORAL | Status: AC
  Administered 2023-11-15: 10 mg via ORAL
  Filled 2023-11-15: qty 1

## 2023-11-15 MED ORDER — SODIUM CHLORIDE 0.9 % IV SOLN
3.0000 mg/kg | Freq: Once | INTRAVENOUS | Status: AC
  Administered 2023-11-15: 200 mg via INTRAVENOUS
  Filled 2023-11-15: qty 10

## 2023-11-15 NOTE — Patient Instructions (Signed)
 [facility] - A DEPT OF [facility]  Discharge Instructions: Thank you for choosing [facility] to provide your oncology and hematology care.   If you have a lab appointment with the Cancer Center, please go directly to the Cancer Center and check in at the registration area.   Wear comfortable clothing and clothing appropriate for easy access to any Portacath or PICC line.   We strive to give you quality time with your provider. You may need to reschedule your appointment if you arrive late (15 or more minutes).  Arriving late affects you and other patients whose appointments are after yours.  Also, if you miss three or more appointments without notifying the office, you may be dismissed from the clinic at the provider's discretion.      For prescription refill requests, have your pharmacy contact our office and allow 72 hours for refills to be completed.    Today you received the following chemotherapy and/or immunotherapy agents: ado-trastuzumab emtansine      To help prevent nausea and vomiting after your treatment, we encourage you to take your nausea medication as directed.  BELOW ARE SYMPTOMS THAT SHOULD BE REPORTED IMMEDIATELY: *FEVER GREATER THAN 100.4 F (38 C) OR HIGHER *CHILLS OR SWEATING *NAUSEA AND VOMITING THAT IS NOT CONTROLLED WITH YOUR NAUSEA MEDICATION *UNUSUAL SHORTNESS OF BREATH *UNUSUAL BRUISING OR BLEEDING *URINARY PROBLEMS (pain or burning when urinating, or frequent urination) *BOWEL PROBLEMS (unusual diarrhea, constipation, pain near the anus) TENDERNESS IN MOUTH AND THROAT WITH OR WITHOUT PRESENCE OF ULCERS (sore throat, sores in mouth, or a toothache) UNUSUAL RASH, SWELLING OR PAIN  UNUSUAL VAGINAL DISCHARGE OR ITCHING   Items with * indicate a potential emergency and should be followed up as soon as possible or go to the Emergency Department if any problems should occur.  Please show the CHEMOTHERAPY ALERT CARD  or IMMUNOTHERAPY ALERT CARD at check-in to the Emergency Department and triage nurse.  Should you have questions after your visit or need to cancel or reschedule your appointment, please contact [facility] - A DEPT OF [facility]  Dept: [phone]  and follow the prompts.  Office hours are 8:00 a.m. to 4:30 p.m. Monday - Friday. Please note that voicemails left after 4:00 p.m. may not be returned until the following business day.  We are closed weekends and major holidays. You have access to a nurse at all times for urgent questions. Please call the main number to the clinic Dept: [phone] and follow the prompts.   For any non-urgent questions, you may also contact your provider using MyChart. We now offer e-Visits for anyone 49 and older to request care online for non-urgent symptoms. For details visit [email or web address].   Also download the MyChart app! Go to the app store, search "MyChart", open the app, select [place], and log in with your MyChart username and password.

## 2023-11-15 NOTE — Progress Notes (Signed)
 [facility] Cancer Follow up:    Donna Annabella SAILOR, FNP [address] [place] [address]   DIAGNOSIS:  Cancer Staging  Malignant neoplasm of overlapping sites of right breast in female, estrogen receptor negative (HCC) Staging form: Breast, AJCC 8th Edition - Clinical stage from 01/01/2023: Stage IIA (cT2, cN0, cM0, G3, ER-, PR-, HER2+) - Signed by Odean Potts, MD on 01/02/2023 Stage prefix: Initial diagnosis Method of lymph node assessment: Clinical Histologic grading system: 3 grade system - Pathologic stage from 09/19/2023: ypT1c, ypN0(sn), cM0, G2, ER-, PR-, HER2+ - Signed by Lanell Donald Stagger, PA-C on 09/19/2023 Stage prefix: Post-therapy Response to neoadjuvant therapy: Partial response Method of lymph node assessment: Sentinel lymph node biopsy Histologic grading system: 3 grade system  Malignant neoplasm of upper-outer quadrant of left breast in female, estrogen receptor positive (HCC) Staging form: Breast, AJCC 8th Edition - Clinical: Stage IIB (cT2, cN1(f), cM0, G3, ER+, PR+, HER2-) - Signed by Lanell Donald Stagger, PA-C on 01/01/2023 Method of lymph node assessment: Core biopsy Histologic grading system: 3 grade system - Pathologic stage from 09/19/2023: ypT2, ypN1b, cM0, G2, ER+, PR+, HER2- - Signed by Lanell Donald Stagger, PA-C on 09/19/2023 Stage prefix: Post-therapy Response to neoadjuvant therapy: Partial response Method of lymph node assessment: Other Multigene prognostic tests performed: None Histologic grading system: 3 grade system    SUMMARY OF ONCOLOGIC HISTORY: Oncology History  Malignant neoplasm of overlapping sites of right breast in female, estrogen receptor negative (HCC)  01/01/2023 Cancer Staging   Staging form: Breast, AJCC 8th Edition - Clinical stage from 01/01/2023: Stage IIA (cT2, cN0, cM0, G3, ER-, PR-, HER2+) - Signed by Odean Potts, MD on 01/02/2023 Stage prefix: Initial diagnosis Method of lymph node  assessment: Clinical Histologic grading system: 3 grade system   01/25/2023 - 10/25/2023 Chemotherapy   Patient is on Treatment Plan : BREAST  Docetaxel  + Carboplatin  + Trastuzumab  + Pertuzumab   (TCHP) q21d       Genetic Testing   [facility] CancerNext+RNA was Negative. Report date is 01/16/2023.   The [facility] CancerNext+RNAinsight Panel includes sequencing, rearrangement analysis, and RNA analysis for the following 39 genes: APC, ATM, BAP1, BARD1, BMPR1A, BRCA1, BRCA2, BRIP1, CDH1, CDKN2A, CHEK2, FH, FLCN, MET, MLH1, MSH2, MSH6, MUTYH, NF1, NTHL1, PALB2, PMS2, PTEN, RAD51C, RAD51D, SMAD4, STK11, TP53, TSC1, TSC2, and VHL (sequencing and deletion/duplication); AXIN2, HOXB13, MBD4, MSH3, POLD1 and POLE (sequencing only); EPCAM and GREM1 (deletion/duplication only).    08/16/2023 Surgery   Bilateral mastectomy:  LEFT: 2.2 cm IDC, grade 2, margins negative, 3/6 SLN + macrometastases, 1 SLN micrometastases, ypT2, ypN2a, prog panel repeat ER 100%, PR 60%, Ki-67 10%, HER2 negative (1+).  RIGHT: ILC 1.8cm, margins negative, 4 SLN negative for cancer, prog panel repeat ER 0% negative, PR 0% negative, Ki-67 10%, HER2 (2+), FISH HER2 positive.     09/19/2023 Cancer Staging   Staging form: Breast, AJCC 8th Edition - Pathologic stage from 09/19/2023: ypT1c, ypN0(sn), cM0, G2, ER-, PR-, HER2+ - Signed by Lanell Donald Stagger, PA-C on 09/19/2023 Stage prefix: Post-therapy Response to neoadjuvant therapy: Partial response Method of lymph node assessment: Sentinel lymph node biopsy Histologic grading system: 3 grade system   10/16/2023 - 12/03/2023 Radiation Therapy   Adjuvant radiation   11/15/2023 -  Chemotherapy   Patient is on Treatment Plan : BREAST ADO-Trastuzumab  Emtansine (Kadcyla) q21d     Malignant neoplasm of upper-outer quadrant of left breast in female, estrogen receptor positive (HCC)  01/01/2023 Cancer Staging   Staging  form: Breast, AJCC 8th Edition - Clinical: Stage IIB (cT2, cN1(f), cM0, G3, ER+,  PR+, HER2-) - Signed by Lanell Donald Stagger, PA-C on 01/01/2023 Method of lymph node assessment: Core biopsy Histologic grading system: 3 grade system   01/25/2023 - 10/25/2023 Chemotherapy   Patient is on Treatment Plan : BREAST  Docetaxel  + Carboplatin  + Trastuzumab  + Pertuzumab   (TCHP) q21d       Genetic Testing   [facility] CancerNext+RNA was Negative. Report date is 01/16/2023.   The [facility] CancerNext+RNAinsight Panel includes sequencing, rearrangement analysis, and RNA analysis for the following 39 genes: APC, ATM, BAP1, BARD1, BMPR1A, BRCA1, BRCA2, BRIP1, CDH1, CDKN2A, CHEK2, FH, FLCN, MET, MLH1, MSH2, MSH6, MUTYH, NF1, NTHL1, PALB2, PMS2, PTEN, RAD51C, RAD51D, SMAD4, STK11, TP53, TSC1, TSC2, and VHL (sequencing and deletion/duplication); AXIN2, HOXB13, MBD4, MSH3, POLD1 and POLE (sequencing only); EPCAM and GREM1 (deletion/duplication only).    08/16/2023 Surgery   Bilateral mastectomy:  LEFT: 2.2 cm IDC, grade 2, margins negative, 3/6 SLN + macrometastases, 1 SLN micrometastases, ypT2, ypN2a, prog panel repeat ER 100%, PR 60%, Ki-67 10%, HER2 negative (1+).  RIGHT: ILC 1.8cm, margins negative, 4 SLN negative for cancer, prog panel repeat ER 0% negative, PR 0% negative, Ki-67 10%, HER2 (2+), FISH HER2 positive.     09/19/2023 Cancer Staging   Staging form: Breast, AJCC 8th Edition - Pathologic stage from 09/19/2023: ypT2, ypN1b, cM0, G2, ER+, PR+, HER2- - Signed by Lanell Donald Stagger, PA-C on 09/19/2023 Stage prefix: Post-therapy Response to neoadjuvant therapy: Partial response Method of lymph node assessment: Other Multigene prognostic tests performed: None Histologic grading system: 3 grade system   10/16/2023 - 12/03/2023 Radiation Therapy   Adjuvant radiation   11/15/2023 -  Chemotherapy   Patient is on Treatment Plan : BREAST ADO-Trastuzumab  Emtansine (Kadcyla) q21d       CURRENT THERAPY: Kadcyla  INTERVAL HISTORY:  Donna [name] is here today for f/u prior to beginning Kadcyla.  She  is doing moderately well.  She denies any new issues today.  Her most recent echocardiogram occurred on 10/02/2023 and demonstrated a LVEF of 60-65%.  She denies any cough, shortness of breath, chest pain, or palpitations.     Patient Active Problem List   Diagnosis Date Noted   ABLA (acute blood loss anemia) 08/22/2023   S/P mastectomy, bilateral 08/16/2023   Bilateral breast cancer (HCC) 08/16/2023   Port-A-Cath in place 01/24/2023   Genetic testing 01/17/2023   Malignant neoplasm of overlapping sites of right breast in female, estrogen receptor negative (HCC) 12/31/2022   Malignant neoplasm of upper-outer quadrant of left breast in female, estrogen receptor positive (HCC) 12/31/2022   Cough    Cigarette nicotine  dependence without complication    Stroke (HCC) 05/25/2020   Primary hypertension     has no known allergies.  MEDICAL HISTORY: Past Medical History:  Diagnosis Date   Allergy    Anxiety    Breast cancer (HCC)    Depression    Hyperlipidemia    no meds    Hypertension    Stroke (HCC) 05/2020   mild weakness on left side of body    SURGICAL HISTORY: Past Surgical History:  Procedure Laterality Date   AXILLARY LYMPH NODE DISSECTION Left 08/16/2023   Procedure: REDGIE HARD;  Surgeon: Curvin Deward MOULD, MD;  Location: [facility] OR;  Service: General;  Laterality: Left;   BREAST BIOPSY Left 12/21/2022   US  LT BREAST BX W LOC DEV 1ST LESION IMG BX SPEC US  GUIDE 12/21/2022 [facility]  BREAST BIOPSY Left 12/21/2022   US  LT BREAST BX W LOC DEV EA ADD LESION IMG BX SPEC US  GUIDE 12/21/2022 [facility]   BREAST BIOPSY Right 12/26/2022   US  RT BREAST BX W LOC DEV 1ST LESION IMG BX SPEC US  GUIDE 12/26/2022 [facility]   BREAST BIOPSY Right 12/26/2022   US  RT BREAST BX W LOC DEV EA ADD LESION IMG BX SPEC US  GUIDE 12/26/2022 [facility]   BREAST BIOPSY Right 12/26/2022   MM RT BREAST BX W LOC DEV 1ST LESION IMAGE BX SPEC STEREO GUIDE 12/26/2022  [facility]   BREAST BIOPSY Left 08/15/2023   US  LT RADIOACTIVE SEED LOC 08/15/2023 [facility]   DENTAL SURGERY     INCISION AND DRAINAGE OF WOUND Right 08/21/2023   Procedure: IRRIGATION AND DEBRIDEMENT WOUND;  Surgeon: Curvin Deward MOULD, MD;  Location: [facility] OR;  Service: General;  Laterality: Right;  WOUND WASHOUT AND CLOSURE   IR IMAGING GUIDED PORT INSERTION  01/22/2023   MASTECTOMY W/ SENTINEL NODE BIOPSY Bilateral 08/16/2023   Procedure: MASTECTOMY WITH SENTINEL LYMPH NODE BIOPSY;  Surgeon: Curvin Deward MOULD, MD;  Location: [facility] OR;  Service: General;  Laterality: Bilateral;  GEN w/PEC BLOCK LEFT MASTY WITH SENTINEL NODE AND TARGETED NODE DISSECTION RIGHT MASTECTOMY WITH SENTINEL NODE   NSVD     x2   TUBAL LIGATION  1976    SOCIAL HISTORY: Social History   Socioeconomic History   Marital status: Divorced    Spouse name: Not on file   Number of children: Not on file   Years of education: Not on file   Highest education level: Not on file  Occupational History   Not on file  Tobacco Use   Smoking status: Every Day    Types: Cigarettes   Smokeless tobacco: Never   Tobacco comments:    8 cigs a day as of 08/13/2023  Vaping Use   Vaping status: Never Used  Substance and Sexual Activity   Alcohol use: Yes    Comment: socially   Drug use: No   Sexual activity: Not Currently  Other Topics Concern   Not on file  Social History Narrative   Not on file   Social Drivers of Health   Financial Resource Strain: Not at Risk (04/04/2022)   Received from [employer]    Financial Resource Strain: 1  Food Insecurity: No Food Insecurity (08/16/2023)   Hunger Vital Sign    Worried About Running Out of Food in the Last Year: Never true    Ran Out of Food in the Last Year: Never true  Transportation Needs: No Transportation Needs (08/16/2023)   PRAPARE - Administrator, Civil Service (Medical): No    Lack of Transportation (Non-Medical): No  Physical  Activity: Not on File (05/04/2021)   Received from [facility]   Physical Activity    Physical Activity: 0  Stress: Not on File (05/04/2021)   Received from [facility]   Stress    Stress: 0  Social Connections: Moderately Integrated (08/16/2023)   Social Connection and Isolation Panel    Frequency of Communication with Friends and Family: More than three times a week    Frequency of Social Gatherings with Friends and Family: Three times a week    Attends Religious Services: 1 to 4 times per year    Active Member of Clubs or Organizations: Yes    Attends Banker Meetings: 1 to 4 times per year  Marital Status: Widowed  Intimate Partner Violence: Not At Risk (08/16/2023)   Humiliation, Afraid, Rape, and Kick questionnaire    Fear of Current or Ex-Partner: No    Emotionally Abused: No    Physically Abused: No    Sexually Abused: No    FAMILY HISTORY: Family History  Problem Relation Age of Onset   Colon cancer Mother 34 - 22   Cancer Father 73 - 57       unknown type   Colon polyps Sister    Esophageal cancer Sister 60 - 61   Lung cancer Sister 39 - 47   Ovarian cancer Sister 91   Colon cancer Brother 74   Prostate cancer Brother    Breast cancer Niece        dx. <50   Rectal cancer Neg Hx    Stomach cancer Neg Hx    Pancreatic cancer Neg Hx     Review of Systems  Constitutional:  Negative for appetite change, chills, fatigue, fever and unexpected weight change.  HENT:   Negative for hearing loss, lump/mass and trouble swallowing.   Eyes:  Negative for eye problems and icterus.  Respiratory:  Negative for chest tightness, cough and shortness of breath.   Cardiovascular:  Negative for chest pain, leg swelling and palpitations.  Gastrointestinal:  Negative for abdominal distention, abdominal pain, constipation, diarrhea, nausea and vomiting.  Endocrine: Negative for hot flashes.  Genitourinary:  Negative for difficulty urinating.   Musculoskeletal:  Negative for  arthralgias.  Skin:  Negative for itching and rash.  Neurological:  Negative for dizziness, extremity weakness, headaches and numbness.  Hematological:  Negative for adenopathy. Does not bruise/bleed easily.  Psychiatric/Behavioral:  Negative for depression. The patient is not nervous/anxious.       PHYSICAL EXAMINATION     Vitals:   11/15/23 0909  BP: (!) 134/59  Pulse: 73  Resp: 17  Temp: 97.9 F (36.6 C)  SpO2: 95%    Physical Exam Constitutional:      General: She is not in acute distress.    Appearance: Normal appearance. She is not toxic-appearing.  HENT:     Head: Normocephalic and atraumatic.     Mouth/Throat:     Mouth: Mucous membranes are moist.     Pharynx: Oropharynx is clear. No oropharyngeal exudate or posterior oropharyngeal erythema.  Eyes:     General: No scleral icterus. Cardiovascular:     Rate and Rhythm: Normal rate and regular rhythm.     Pulses: Normal pulses.     Heart sounds: Normal heart sounds.  Pulmonary:     Effort: Pulmonary effort is normal.     Breath sounds: Normal breath sounds.  Abdominal:     General: Abdomen is flat. Bowel sounds are normal. There is no distension.     Palpations: Abdomen is soft.     Tenderness: There is no abdominal tenderness.  Musculoskeletal:        General: No swelling.     Cervical back: Neck supple.  Lymphadenopathy:     Cervical: No cervical adenopathy.  Skin:    General: Skin is warm and dry.     Findings: No rash.  Neurological:     General: No focal deficit present.     Mental Status: She is alert.  Psychiatric:        Mood and Affect: Mood normal.        Behavior: Behavior normal.     LABORATORY DATA:  CBC    Component  Value Date/Time   WBC 8.2 11/15/2023 0842   WBC 13.5 (H) 08/23/2023 0416   RBC 3.90 11/15/2023 0842   HGB 12.3 11/15/2023 0842   HCT 37.6 11/15/2023 0842   PLT 210 11/15/2023 0842   MCV 96.4 11/15/2023 0842   MCH 31.5 11/15/2023 0842   MCHC 32.7 11/15/2023 0842    RDW 14.6 11/15/2023 0842   LYMPHSABS 1.2 11/15/2023 0842   MONOABS 0.5 11/15/2023 0842   EOSABS 0.1 11/15/2023 0842   BASOSABS 0.0 11/15/2023 0842    CMP     Component Value Date/Time   NA 143 11/15/2023 0842   K 3.7 11/15/2023 0842   CL 111 11/15/2023 0842   CO2 28 11/15/2023 0842   GLUCOSE 94 11/15/2023 0842   BUN 18 11/15/2023 0842   CREATININE 0.77 11/15/2023 0842   CALCIUM  9.2 11/15/2023 0842   PROT 6.2 (L) 11/15/2023 0842   ALBUMIN  3.7 11/15/2023 0842   AST 19 11/15/2023 0842   ALT 14 11/15/2023 0842   ALKPHOS 87 11/15/2023 0842   BILITOT 0.3 11/15/2023 0842   GFRNONAA >60 11/15/2023 0842     ASSESSMENT and THERAPY PLAN:   Malignant neoplasm of overlapping sites of right breast in female, estrogen receptor negative (HCC) 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%, HER2 3+ positive   Treatment plan: Neoadjuvant TCHP x 5 cycles (stopped early for toxicities) followed by HP maintenance versus Kadcyla maintenance 09/04/2023: Left mastectomy: Residual IDC 2.2 cm with DCIS, margins negative for invasive cancer, DCIS: Present at anterior-inferior margin, 4/6 sentinel lymph nodes positive, ER 100%, PR 60%, Ki67 10%, HER2 1+; right mastectomy: 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 I discussed the margins as well as lymph node surgeries. We also discussed the final staging along with previously performed ER/PR and HER-2/neu testing.  Treatment plan: Recommend Kadcyla maintenance Adjuvant radiation therapy Adjuvant antiestrogen therapy and neratinib  Patient to begin Kadcyla today per Dr. Gara treatment plan noted above.  Her most recent echo was normal.  She and I reviewed risks and benefits of the treatment and she is in agreement to proceed.  I reviewed her labs in detail which are stable and within parameters for treatment.    RTC in 3 weeks for labs, f/u, and next treatment.       All questions were answered. The patient knows to call the clinic with any problems, questions or concerns. We can certainly see the patient much sooner if necessary.  Total encounter time:20 minutes*in face-to-face visit time, chart review, lab review, care coordination, order entry, and documentation of the encounter time.    Morna Kendall, NP 11/18/23 8:42 PM Medical Oncology and Hematology [facility] [address] [place], [place] [address] Tel. [phone]    Fax. [phone]  *Total Encounter  Time as defined by the Centers for Medicare and Medicaid Services includes, in addition to the face-to-face time of a patient visit (documented in the note above) non-face-to-face time: obtaining and reviewing outside history, ordering and reviewing medications, tests or procedures, care coordination (communications with other health care professionals or caregivers) and documentation in the medical record.

## 2023-11-18 ENCOUNTER — Other Ambulatory Visit: Payer: Self-pay

## 2023-11-18 ENCOUNTER — Encounter: Payer: Self-pay | Admitting: Hematology and Oncology

## 2023-11-18 ENCOUNTER — Ambulatory Visit
Admission: RE | Admit: 2023-11-18 | Discharge: 2023-11-18 | Disposition: A | Discharge status: Home / Self Care | Source: Ambulatory Visit | Attending: Radiation Oncology | Admitting: Radiation Oncology

## 2023-11-18 DIAGNOSIS — C50412 Malignant neoplasm of upper-outer quadrant of left female breast: Secondary | ICD-10-CM | POA: Diagnosis present

## 2023-11-18 DIAGNOSIS — Z510 Encounter for antineoplastic radiation therapy: Secondary | ICD-10-CM | POA: Insufficient documentation

## 2023-11-18 DIAGNOSIS — Z170 Estrogen receptor positive status [ER+]: Secondary | ICD-10-CM | POA: Insufficient documentation

## 2023-11-18 LAB — RAD ONC ARIA SESSION SUMMARY
Course Elapsed Days: 33
Plan Fractions Treated to Date: 11
Plan Fractions Treated to Date: 22
Plan Prescribed Dose Per Fraction: 1.8 Gy
Plan Prescribed Dose Per Fraction: 1.8 Gy
Plan Total Fractions Prescribed: 14
Plan Total Fractions Prescribed: 28
Plan Total Prescribed Dose: 25.2 Gy
Plan Total Prescribed Dose: 50.4 Gy
Reference Point Dosage Given to Date: 19.8 Gy
Reference Point Dosage Given to Date: 39.6 Gy
Reference Point Session Dosage Given: 1.8 Gy
Reference Point Session Dosage Given: 1.8 Gy
Session Number: 22

## 2023-11-18 NOTE — Assessment & Plan Note (Signed)
 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%, HER2 3+ positive   Treatment plan: Neoadjuvant TCHP x 5 cycles (stopped early for toxicities) followed by HP maintenance versus Kadcyla maintenance 09/04/2023: Left mastectomy: Residual IDC 2.2 cm with DCIS, margins negative for invasive cancer, DCIS: Present at anterior-inferior margin, 4/6 sentinel lymph nodes positive, ER 100%, PR 60%, Ki67 10%, HER2 1+; right mastectomy: 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 I discussed the margins as well as lymph node surgeries. We also discussed the final staging along with previously performed ER/PR and HER-2/neu testing.  Treatment plan: Recommend Kadcyla maintenance Adjuvant radiation therapy Adjuvant antiestrogen therapy and neratinib  Patient to begin Kadcyla today per Dr. Gara treatment plan noted above.  Her most recent echo was normal.  She and I reviewed risks and benefits of the treatment and she is in agreement to proceed.  I reviewed her labs in detail which are stable and within parameters for treatment.    RTC  in 3 weeks for labs, f/u, and next treatment.

## 2023-11-19 ENCOUNTER — Other Ambulatory Visit: Payer: Self-pay

## 2023-11-19 ENCOUNTER — Ambulatory Visit
Admission: RE | Admit: 2023-11-19 | Discharge: 2023-11-19 | Disposition: A | Source: Ambulatory Visit | Attending: Radiation Oncology

## 2023-11-19 DIAGNOSIS — C50412 Malignant neoplasm of upper-outer quadrant of left female breast: Secondary | ICD-10-CM | POA: Diagnosis not present

## 2023-11-19 LAB — RAD ONC ARIA SESSION SUMMARY
Course Elapsed Days: 34
Plan Fractions Treated to Date: 12
Plan Fractions Treated to Date: 23
Plan Prescribed Dose Per Fraction: 1.8 Gy
Plan Prescribed Dose Per Fraction: 1.8 Gy
Plan Total Fractions Prescribed: 14
Plan Total Fractions Prescribed: 28
Plan Total Prescribed Dose: 25.2 Gy
Plan Total Prescribed Dose: 50.4 Gy
Reference Point Dosage Given to Date: 21.6 Gy
Reference Point Dosage Given to Date: 41.4 Gy
Reference Point Session Dosage Given: 1.8 Gy
Reference Point Session Dosage Given: 1.8 Gy
Session Number: 23

## 2023-11-20 ENCOUNTER — Other Ambulatory Visit: Payer: Self-pay

## 2023-11-20 ENCOUNTER — Ambulatory Visit
Admission: RE | Admit: 2023-11-20 | Discharge: 2023-11-20 | Disposition: A | Source: Ambulatory Visit | Attending: Radiation Oncology

## 2023-11-20 DIAGNOSIS — C50412 Malignant neoplasm of upper-outer quadrant of left female breast: Secondary | ICD-10-CM | POA: Diagnosis not present

## 2023-11-20 LAB — RAD ONC ARIA SESSION SUMMARY
Course Elapsed Days: 35
Plan Fractions Treated to Date: 12
Plan Fractions Treated to Date: 24
Plan Prescribed Dose Per Fraction: 1.8 Gy
Plan Prescribed Dose Per Fraction: 1.8 Gy
Plan Total Fractions Prescribed: 14
Plan Total Fractions Prescribed: 28
Plan Total Prescribed Dose: 25.2 Gy
Plan Total Prescribed Dose: 50.4 Gy
Reference Point Dosage Given to Date: 21.6 Gy
Reference Point Dosage Given to Date: 43.2 Gy
Reference Point Session Dosage Given: 1.8 Gy
Reference Point Session Dosage Given: 1.8 Gy
Session Number: 24

## 2023-11-21 ENCOUNTER — Other Ambulatory Visit: Payer: Self-pay

## 2023-11-21 ENCOUNTER — Ambulatory Visit
Admission: RE | Admit: 2023-11-21 | Discharge: 2023-11-21 | Disposition: A | Discharge status: Home / Self Care | Source: Ambulatory Visit | Attending: Radiation Oncology | Admitting: Radiation Oncology

## 2023-11-21 DIAGNOSIS — C50412 Malignant neoplasm of upper-outer quadrant of left female breast: Secondary | ICD-10-CM | POA: Diagnosis not present

## 2023-11-21 LAB — RAD ONC ARIA SESSION SUMMARY
Course Elapsed Days: 36
Plan Fractions Treated to Date: 13
Plan Fractions Treated to Date: 25
Plan Prescribed Dose Per Fraction: 1.8 Gy
Plan Prescribed Dose Per Fraction: 1.8 Gy
Plan Total Fractions Prescribed: 14
Plan Total Fractions Prescribed: 28
Plan Total Prescribed Dose: 25.2 Gy
Plan Total Prescribed Dose: 50.4 Gy
Reference Point Dosage Given to Date: 23.4 Gy
Reference Point Dosage Given to Date: 45 Gy
Reference Point Session Dosage Given: 1.8 Gy
Reference Point Session Dosage Given: 1.8 Gy
Session Number: 25

## 2023-11-22 ENCOUNTER — Ambulatory Visit

## 2023-11-22 ENCOUNTER — Other Ambulatory Visit: Payer: Self-pay

## 2023-11-22 ENCOUNTER — Ambulatory Visit: Admitting: Radiation Oncology

## 2023-11-25 ENCOUNTER — Ambulatory Visit: Admitting: Radiation Oncology

## 2023-11-25 ENCOUNTER — Ambulatory Visit
Admission: RE | Admit: 2023-11-25 | Discharge: 2023-11-25 | Disposition: A | Discharge status: Home / Self Care | Source: Ambulatory Visit | Attending: Radiation Oncology | Admitting: Radiation Oncology

## 2023-11-25 ENCOUNTER — Telehealth: Payer: Self-pay | Admitting: Radiation Oncology

## 2023-11-25 ENCOUNTER — Ambulatory Visit

## 2023-11-25 ENCOUNTER — Other Ambulatory Visit: Payer: Self-pay

## 2023-11-25 DIAGNOSIS — C50412 Malignant neoplasm of upper-outer quadrant of left female breast: Secondary | ICD-10-CM | POA: Diagnosis not present

## 2023-11-25 LAB — RAD ONC ARIA SESSION SUMMARY
Course Elapsed Days: 40
Plan Fractions Treated to Date: 13
Plan Fractions Treated to Date: 26
Plan Prescribed Dose Per Fraction: 1.8 Gy
Plan Prescribed Dose Per Fraction: 1.8 Gy
Plan Total Fractions Prescribed: 14
Plan Total Fractions Prescribed: 28
Plan Total Prescribed Dose: 25.2 Gy
Plan Total Prescribed Dose: 50.4 Gy
Reference Point Dosage Given to Date: 23.4 Gy
Reference Point Dosage Given to Date: 46.8 Gy
Reference Point Session Dosage Given: 1.8 Gy
Reference Point Session Dosage Given: 1.8 Gy
Session Number: 26

## 2023-11-25 NOTE — Telephone Encounter (Signed)
 Pt called asking to cx today's appt due to transportation issues. Pt advised she did confirm appt 11/9 but transportation has not come. Treatment team notified via email.

## 2023-11-26 ENCOUNTER — Ambulatory Visit
Admission: RE | Admit: 2023-11-26 | Discharge: 2023-11-26 | Disposition: A | Discharge status: Home / Self Care | Source: Ambulatory Visit | Attending: Radiation Oncology | Admitting: Radiation Oncology

## 2023-11-26 ENCOUNTER — Other Ambulatory Visit: Payer: Self-pay

## 2023-11-26 ENCOUNTER — Ambulatory Visit

## 2023-11-26 DIAGNOSIS — C50412 Malignant neoplasm of upper-outer quadrant of left female breast: Secondary | ICD-10-CM | POA: Diagnosis not present

## 2023-11-26 LAB — RAD ONC ARIA SESSION SUMMARY
Course Elapsed Days: 41
Plan Fractions Treated to Date: 14
Plan Fractions Treated to Date: 27
Plan Prescribed Dose Per Fraction: 1.8 Gy
Plan Prescribed Dose Per Fraction: 1.8 Gy
Plan Total Fractions Prescribed: 14
Plan Total Fractions Prescribed: 28
Plan Total Prescribed Dose: 25.2 Gy
Plan Total Prescribed Dose: 50.4 Gy
Reference Point Dosage Given to Date: 25.2 Gy
Reference Point Dosage Given to Date: 48.6 Gy
Reference Point Session Dosage Given: 1.8 Gy
Reference Point Session Dosage Given: 1.8 Gy
Session Number: 27

## 2023-11-27 ENCOUNTER — Ambulatory Visit
Admission: RE | Admit: 2023-11-27 | Discharge: 2023-11-27 | Disposition: A | Discharge status: Home / Self Care | Source: Ambulatory Visit | Attending: Radiation Oncology | Admitting: Radiation Oncology

## 2023-11-27 ENCOUNTER — Other Ambulatory Visit: Payer: Self-pay

## 2023-11-27 ENCOUNTER — Ambulatory Visit

## 2023-11-27 DIAGNOSIS — C50412 Malignant neoplasm of upper-outer quadrant of left female breast: Secondary | ICD-10-CM | POA: Diagnosis not present

## 2023-11-27 LAB — RAD ONC ARIA SESSION SUMMARY
Course Elapsed Days: 42
Plan Fractions Treated to Date: 14
Plan Fractions Treated to Date: 28
Plan Prescribed Dose Per Fraction: 1.8 Gy
Plan Prescribed Dose Per Fraction: 1.8 Gy
Plan Total Fractions Prescribed: 14
Plan Total Fractions Prescribed: 28
Plan Total Prescribed Dose: 25.2 Gy
Plan Total Prescribed Dose: 50.4 Gy
Reference Point Dosage Given to Date: 25.2 Gy
Reference Point Dosage Given to Date: 50.4 Gy
Reference Point Session Dosage Given: 1.8 Gy
Reference Point Session Dosage Given: 1.8 Gy
Session Number: 28

## 2023-11-28 ENCOUNTER — Other Ambulatory Visit: Payer: Self-pay

## 2023-11-28 ENCOUNTER — Ambulatory Visit

## 2023-11-28 ENCOUNTER — Ambulatory Visit
Admission: RE | Admit: 2023-11-28 | Discharge: 2023-11-28 | Disposition: A | Discharge status: Home / Self Care | Source: Ambulatory Visit | Attending: Radiation Oncology | Admitting: Radiation Oncology

## 2023-11-28 DIAGNOSIS — C50412 Malignant neoplasm of upper-outer quadrant of left female breast: Secondary | ICD-10-CM | POA: Diagnosis not present

## 2023-11-28 LAB — RAD ONC ARIA SESSION SUMMARY
Course Elapsed Days: 43
Plan Fractions Treated to Date: 1
Plan Prescribed Dose Per Fraction: 2 Gy
Plan Total Fractions Prescribed: 5
Plan Total Prescribed Dose: 10 Gy
Reference Point Dosage Given to Date: 2 Gy
Reference Point Session Dosage Given: 2 Gy
Session Number: 29

## 2023-11-29 ENCOUNTER — Ambulatory Visit

## 2023-11-29 ENCOUNTER — Other Ambulatory Visit: Payer: Self-pay

## 2023-11-29 ENCOUNTER — Ambulatory Visit
Admission: RE | Admit: 2023-11-29 | Discharge: 2023-11-29 | Disposition: A | Discharge status: Home / Self Care | Source: Ambulatory Visit | Attending: Radiation Oncology | Admitting: Radiation Oncology

## 2023-11-29 DIAGNOSIS — C50412 Malignant neoplasm of upper-outer quadrant of left female breast: Secondary | ICD-10-CM | POA: Diagnosis not present

## 2023-11-29 LAB — RAD ONC ARIA SESSION SUMMARY
Course Elapsed Days: 44
Plan Fractions Treated to Date: 2
Plan Prescribed Dose Per Fraction: 2 Gy
Plan Total Fractions Prescribed: 5
Plan Total Prescribed Dose: 10 Gy
Reference Point Dosage Given to Date: 4 Gy
Reference Point Session Dosage Given: 2 Gy
Session Number: 30

## 2023-12-02 ENCOUNTER — Ambulatory Visit

## 2023-12-02 ENCOUNTER — Other Ambulatory Visit: Payer: Self-pay

## 2023-12-02 ENCOUNTER — Ambulatory Visit
Admission: RE | Admit: 2023-12-02 | Discharge: 2023-12-02 | Disposition: A | Source: Ambulatory Visit | Attending: Radiation Oncology

## 2023-12-02 DIAGNOSIS — C50412 Malignant neoplasm of upper-outer quadrant of left female breast: Secondary | ICD-10-CM | POA: Diagnosis not present

## 2023-12-02 LAB — RAD ONC ARIA SESSION SUMMARY
Course Elapsed Days: 47
Plan Fractions Treated to Date: 3
Plan Prescribed Dose Per Fraction: 2 Gy
Plan Total Fractions Prescribed: 5
Plan Total Prescribed Dose: 10 Gy
Reference Point Dosage Given to Date: 6 Gy
Reference Point Session Dosage Given: 2 Gy
Session Number: 31

## 2023-12-03 ENCOUNTER — Ambulatory Visit (HOSPITAL_COMMUNITY)
Admission: RE | Admit: 2023-12-03 | Discharge: 2023-12-03 | Disposition: A | Discharge status: Home / Self Care | Source: Ambulatory Visit | Attending: Adult Health | Admitting: Adult Health

## 2023-12-03 ENCOUNTER — Ambulatory Visit

## 2023-12-03 ENCOUNTER — Ambulatory Visit
Admission: RE | Admit: 2023-12-03 | Discharge: 2023-12-03 | Disposition: A | Source: Ambulatory Visit | Attending: Radiation Oncology

## 2023-12-03 ENCOUNTER — Other Ambulatory Visit: Payer: Self-pay

## 2023-12-03 DIAGNOSIS — Z170 Estrogen receptor positive status [ER+]: Secondary | ICD-10-CM | POA: Diagnosis present

## 2023-12-03 DIAGNOSIS — C50412 Malignant neoplasm of upper-outer quadrant of left female breast: Secondary | ICD-10-CM | POA: Diagnosis present

## 2023-12-03 DIAGNOSIS — G4452 New daily persistent headache (NDPH): Secondary | ICD-10-CM | POA: Diagnosis present

## 2023-12-03 LAB — RAD ONC ARIA SESSION SUMMARY
Course Elapsed Days: 48
Plan Fractions Treated to Date: 4
Plan Prescribed Dose Per Fraction: 2 Gy
Plan Total Fractions Prescribed: 5
Plan Total Prescribed Dose: 10 Gy
Reference Point Dosage Given to Date: 8 Gy
Reference Point Session Dosage Given: 2 Gy
Session Number: 32

## 2023-12-03 MED ORDER — GADOBUTROL 1 MMOL/ML IV SOLN
7.0000 mL | Freq: Once | INTRAVENOUS | Status: AC | PRN
  Administered 2023-12-03: 7 mL via INTRAVENOUS

## 2023-12-04 ENCOUNTER — Other Ambulatory Visit: Payer: Self-pay

## 2023-12-04 ENCOUNTER — Ambulatory Visit
Admission: RE | Admit: 2023-12-04 | Discharge: 2023-12-04 | Disposition: A | Discharge status: Home / Self Care | Source: Ambulatory Visit | Attending: Radiation Oncology | Admitting: Radiation Oncology

## 2023-12-04 ENCOUNTER — Ambulatory Visit

## 2023-12-04 DIAGNOSIS — C50412 Malignant neoplasm of upper-outer quadrant of left female breast: Secondary | ICD-10-CM | POA: Diagnosis not present

## 2023-12-04 LAB — RAD ONC ARIA SESSION SUMMARY
Course Elapsed Days: 49
Plan Fractions Treated to Date: 5
Plan Prescribed Dose Per Fraction: 2 Gy
Plan Total Fractions Prescribed: 5
Plan Total Prescribed Dose: 10 Gy
Reference Point Dosage Given to Date: 10 Gy
Reference Point Session Dosage Given: 2 Gy
Session Number: 33

## 2023-12-05 ENCOUNTER — Inpatient Hospital Stay (HOSPITAL_BASED_OUTPATIENT_CLINIC_OR_DEPARTMENT_OTHER): Admitting: Adult Health

## 2023-12-05 ENCOUNTER — Inpatient Hospital Stay: Attending: Hematology and Oncology

## 2023-12-05 ENCOUNTER — Encounter: Payer: Self-pay | Admitting: Adult Health

## 2023-12-05 ENCOUNTER — Inpatient Hospital Stay

## 2023-12-05 ENCOUNTER — Other Ambulatory Visit: Payer: Self-pay

## 2023-12-05 ENCOUNTER — Ambulatory Visit

## 2023-12-05 VITALS — BP 127/44 | HR 84 | Temp 97.9°F | Resp 17 | Ht 69.0 in | Wt 153.9 lb

## 2023-12-05 DIAGNOSIS — F17210 Nicotine dependence, cigarettes, uncomplicated: Secondary | ICD-10-CM | POA: Diagnosis not present

## 2023-12-05 DIAGNOSIS — Z1722 Progesterone receptor negative status: Secondary | ICD-10-CM | POA: Insufficient documentation

## 2023-12-05 DIAGNOSIS — Z923 Personal history of irradiation: Secondary | ICD-10-CM | POA: Insufficient documentation

## 2023-12-05 DIAGNOSIS — Z83719 Family history of colon polyps, unspecified: Secondary | ICD-10-CM | POA: Insufficient documentation

## 2023-12-05 DIAGNOSIS — Z171 Estrogen receptor negative status [ER-]: Secondary | ICD-10-CM

## 2023-12-05 DIAGNOSIS — Z803 Family history of malignant neoplasm of breast: Secondary | ICD-10-CM | POA: Diagnosis not present

## 2023-12-05 DIAGNOSIS — Z1731 Human epidermal growth factor receptor 2 positive status: Secondary | ICD-10-CM | POA: Insufficient documentation

## 2023-12-05 DIAGNOSIS — Z5112 Encounter for antineoplastic immunotherapy: Secondary | ICD-10-CM | POA: Insufficient documentation

## 2023-12-05 DIAGNOSIS — Z1732 Human epidermal growth factor receptor 2 negative status: Secondary | ICD-10-CM | POA: Insufficient documentation

## 2023-12-05 DIAGNOSIS — Z8041 Family history of malignant neoplasm of ovary: Secondary | ICD-10-CM | POA: Insufficient documentation

## 2023-12-05 DIAGNOSIS — Z170 Estrogen receptor positive status [ER+]: Secondary | ICD-10-CM

## 2023-12-05 DIAGNOSIS — C50412 Malignant neoplasm of upper-outer quadrant of left female breast: Secondary | ICD-10-CM

## 2023-12-05 DIAGNOSIS — Z800 Family history of malignant neoplasm of digestive organs: Secondary | ICD-10-CM | POA: Insufficient documentation

## 2023-12-05 DIAGNOSIS — Z9221 Personal history of antineoplastic chemotherapy: Secondary | ICD-10-CM | POA: Diagnosis not present

## 2023-12-05 DIAGNOSIS — Z9013 Acquired absence of bilateral breasts and nipples: Secondary | ICD-10-CM | POA: Diagnosis not present

## 2023-12-05 DIAGNOSIS — C50811 Malignant neoplasm of overlapping sites of right female breast: Secondary | ICD-10-CM

## 2023-12-05 DIAGNOSIS — Z1721 Progesterone receptor positive status: Secondary | ICD-10-CM | POA: Diagnosis not present

## 2023-12-05 DIAGNOSIS — Z801 Family history of malignant neoplasm of trachea, bronchus and lung: Secondary | ICD-10-CM | POA: Insufficient documentation

## 2023-12-05 LAB — CBC WITH DIFFERENTIAL (CANCER CENTER ONLY)
Abs Immature Granulocytes: 0.01 K/uL (ref 0.00–0.07)
Basophils Absolute: 0 K/uL (ref 0.0–0.1)
Basophils Relative: 0 %
Eosinophils Absolute: 0.1 K/uL (ref 0.0–0.5)
Eosinophils Relative: 1 %
HCT: 36.2 % (ref 36.0–46.0)
Hemoglobin: 12 g/dL (ref 12.0–15.0)
Immature Granulocytes: 0 %
Lymphocytes Relative: 19 %
Lymphs Abs: 1.2 K/uL (ref 0.7–4.0)
MCH: 31 pg (ref 26.0–34.0)
MCHC: 33.1 g/dL (ref 30.0–36.0)
MCV: 93.5 fL (ref 80.0–100.0)
Monocytes Absolute: 0.4 K/uL (ref 0.1–1.0)
Monocytes Relative: 6 %
Neutro Abs: 4.6 K/uL (ref 1.7–7.7)
Neutrophils Relative %: 74 %
Platelet Count: 249 K/uL (ref 150–400)
RBC: 3.87 MIL/uL (ref 3.87–5.11)
RDW: 14.4 % (ref 11.5–15.5)
WBC Count: 6.2 K/uL (ref 4.0–10.5)
nRBC: 0 % (ref 0.0–0.2)

## 2023-12-05 LAB — CMP (CANCER CENTER ONLY)
ALT: 27 U/L (ref 0–44)
AST: 31 U/L (ref 15–41)
Albumin: 3.9 g/dL (ref 3.5–5.0)
Alkaline Phosphatase: 100 U/L (ref 38–126)
Anion gap: 9 (ref 5–15)
BUN: 14 mg/dL (ref 8–23)
CO2: 24 mmol/L (ref 22–32)
Calcium: 9.6 mg/dL (ref 8.9–10.3)
Chloride: 106 mmol/L (ref 98–111)
Creatinine: 0.74 mg/dL (ref 0.44–1.00)
GFR, Estimated: >60 mL/min (ref >60)
Glucose, Bld: 92 mg/dL (ref 70–99)
Potassium: 4 mmol/L (ref 3.5–5.1)
Sodium: 139 mmol/L (ref 135–145)
Total Bilirubin: 0.4 mg/dL (ref 0.0–1.2)
Total Protein: 6.3 g/dL — ABNORMAL LOW (ref 6.5–8.1)

## 2023-12-05 MED ORDER — SODIUM CHLORIDE 0.9 % IV SOLN: INTRAVENOUS | Status: DC

## 2023-12-05 MED ORDER — SODIUM CHLORIDE 0.9 % IV SOLN
3.0000 mg/kg | Freq: Once | INTRAVENOUS | Status: AC
  Administered 2023-12-05: 200 mg via INTRAVENOUS
  Filled 2023-12-05: qty 10

## 2023-12-05 MED ORDER — ACETAMINOPHEN 325 MG PO TABS
650.0000 mg | ORAL_TABLET | Freq: Once | ORAL | Status: AC
  Administered 2023-12-05: 650 mg via ORAL
  Filled 2023-12-05: qty 2

## 2023-12-05 MED ORDER — DIPHENHYDRAMINE HCL 25 MG PO CAPS
25.0000 mg | ORAL_CAPSULE | Freq: Once | ORAL | Status: AC
  Administered 2023-12-05: 25 mg via ORAL
  Filled 2023-12-05: qty 1

## 2023-12-05 MED ORDER — PROCHLORPERAZINE MALEATE 10 MG PO TABS
10.0000 mg | ORAL_TABLET | Freq: Once | ORAL | Status: AC
  Administered 2023-12-05: 10 mg via ORAL
  Filled 2023-12-05: qty 1

## 2023-12-05 NOTE — Patient Outreach (Signed)
 Aging Gracefully Program  12/05/2023  Ozell Ferrera 1950/12/24 [id number]   [facility] Evaluation Interviewer attempted to call patient on today regarding Aging Gracefully referral. No answer from patient after multiple rings. CMA left confidential voicemail for patient to return call.  Will attempt to call back within 1 week.    Shereen [facility]unders [facility]  Population Health Care Management Assistant  Direct Dial: [phone]  Fax: [phone] Website: [email or web address]

## 2023-12-05 NOTE — Patient Instructions (Signed)
 [facility] - A DEPT OF [facility]  Discharge Instructions: Thank you for choosing [facility] to provide your oncology and hematology care.   If you have a lab appointment with the Cancer Center, please go directly to the Cancer Center and check in at the registration area.   Wear comfortable clothing and clothing appropriate for easy access to any Portacath or PICC line.   We strive to give you quality time with your provider. You may need to reschedule your appointment if you arrive late (15 or more minutes).  Arriving late affects you and other patients whose appointments are after yours.  Also, if you miss three or more appointments without notifying the office, you may be dismissed from the clinic at the provider's discretion.      For prescription refill requests, have your pharmacy contact our office and allow 72 hours for refills to be completed.    Today you received the following chemotherapy and/or immunotherapy agents: ado-trastuzumab emtansine      To help prevent nausea and vomiting after your treatment, we encourage you to take your nausea medication as directed.  BELOW ARE SYMPTOMS THAT SHOULD BE REPORTED IMMEDIATELY: *FEVER GREATER THAN 100.4 F (38 C) OR HIGHER *CHILLS OR SWEATING *NAUSEA AND VOMITING THAT IS NOT CONTROLLED WITH YOUR NAUSEA MEDICATION *UNUSUAL SHORTNESS OF BREATH *UNUSUAL BRUISING OR BLEEDING *URINARY PROBLEMS (pain or burning when urinating, or frequent urination) *BOWEL PROBLEMS (unusual diarrhea, constipation, pain near the anus) TENDERNESS IN MOUTH AND THROAT WITH OR WITHOUT PRESENCE OF ULCERS (sore throat, sores in mouth, or a toothache) UNUSUAL RASH, SWELLING OR PAIN  UNUSUAL VAGINAL DISCHARGE OR ITCHING   Items with * indicate a potential emergency and should be followed up as soon as possible or go to the Emergency Department if any problems should occur.  Please show the CHEMOTHERAPY ALERT CARD  or IMMUNOTHERAPY ALERT CARD at check-in to the Emergency Department and triage nurse.  Should you have questions after your visit or need to cancel or reschedule your appointment, please contact [facility] - A DEPT OF [facility]  Dept: [phone]  and follow the prompts.  Office hours are 8:00 a.m. to 4:30 p.m. Monday - Friday. Please note that voicemails left after 4:00 p.m. may not be returned until the following business day.  We are closed weekends and major holidays. You have access to a nurse at all times for urgent questions. Please call the main number to the clinic Dept: [phone] and follow the prompts.   For any non-urgent questions, you may also contact your provider using MyChart. We now offer e-Visits for anyone 49 and older to request care online for non-urgent symptoms. For details visit [email or web address].   Also download the MyChart app! Go to the app store, search "MyChart", open the app, select [place], and log in with your MyChart username and password.

## 2023-12-06 ENCOUNTER — Telehealth: Payer: Self-pay

## 2023-12-06 ENCOUNTER — Ambulatory Visit: Payer: Self-pay

## 2023-12-06 NOTE — Telephone Encounter (Signed)
 Called patient to provide MRI results. MRI findings appear normal. No evidence of cancer.

## 2023-12-06 NOTE — Radiation Completion Notes (Addendum)
  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    Intent: Curative     ==========DELIVERED PLANS==========  First Treatment Date: 2023-10-16 Last Treatment Date: 2023-12-04   Plan Name: CW_L_BH_BO Site: Chest Wall, Left Technique: 3D Mode: Photon Dose Per Fraction: 1.8 Gy Prescribed Dose (Delivered / Prescribed): 25.2 Gy / 25.2 Gy Prescribed Fxs (Delivered / Prescribed): 14 / 14   Plan Name: SCV_L_BH Site: Chest Wall, Left Technique: 3D Mode: Photon Dose Per Fraction: 1.8 Gy Prescribed Dose (Delivered / Prescribed): 50.4 Gy / 50.4 Gy Prescribed Fxs (Delivered / Prescribed): 28 / 28   Plan Name: CW_L_Bst_BO Site: Chest Wall, Left Technique: Electron Mode: Electron Dose Per Fraction: 2 Gy Prescribed Dose (Delivered / Prescribed): 10 Gy / 10 Gy Prescribed Fxs (Delivered / Prescribed): 5 / 5   Plan Name: CW_L_BH Site: Chest Wall, Left Technique: 3D Mode: Photon Dose Per Fraction: 1.8 Gy Prescribed Dose (Delivered / Prescribed): 25.2 Gy / 25.2 Gy Prescribed Fxs (Delivered / Prescribed): 14 / 14     ==========ON TREATMENT VISIT DATES========== 2023-10-24, 2023-10-25, 2023-11-01, 2023-11-08, 2023-11-15, 2023-11-29, 2023-12-04    See weekly On Treatment Notes in Epic for details in the Media tab (listed as Progress notes on the On Treatment Visit Dates listed above). The patient tolerated radiation. She developed fatigue and anticipated skin changes in the treatment field.   The patient will receive a call in about one month from the radiation oncology department. She will continue follow up with Dr. Gudena as well.       Donald KYM Husband, PAC

## 2023-12-08 ENCOUNTER — Other Ambulatory Visit: Payer: Self-pay

## 2023-12-09 ENCOUNTER — Other Ambulatory Visit: Payer: Self-pay

## 2023-12-09 ENCOUNTER — Encounter: Payer: Self-pay | Admitting: Adult Health

## 2023-12-09 ENCOUNTER — Encounter: Payer: Self-pay | Admitting: Hematology and Oncology

## 2023-12-09 NOTE — Assessment & Plan Note (Signed)
 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%, HER2 3+ positive   Treatment plan: Neoadjuvant TCHP x 5 cycles (stopped early for toxicities) followed by HP maintenance versus Kadcyla  maintenance 09/04/2023: Left mastectomy: Residual IDC 2.2 cm with DCIS, margins negative for invasive cancer, DCIS: Present at anterior-inferior margin, 4/6 sentinel lymph nodes positive, ER 100%, PR 60%, Ki67 10%, HER2 1+; right mastectomy: 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 I discussed the margins as well as lymph node surgeries. We also discussed the final staging along with previously performed ER/PR and HER-2/neu testing.  Treatment plan: Recommend Kadcyla  maintenance Adjuvant radiation therapy Adjuvant antiestrogen therapy and neratinib  RTC in 3 weeks for labs, f/u, and next treatment.

## 2023-12-09 NOTE — Progress Notes (Signed)
 Donna [name]:    Donna Annabella SAILOR, FNP [address] [place] [address]   DIAGNOSIS:  Cancer Staging  Malignant neoplasm of overlapping sites of right breast in female, estrogen receptor negative (HCC) Staging form: Breast, AJCC 8th Edition - Clinical stage from 01/01/2023: Stage IIA (cT2, cN0, cM0, G3, ER-, PR-, HER2+) - Signed by Odean Potts, MD on 01/02/2023 Stage prefix: Initial diagnosis Method of lymph node assessment: Clinical Histologic grading system: 3 grade system - Pathologic stage from 09/19/2023: ypT1c, ypN0(sn), cM0, G2, ER-, PR-, HER2+ - Signed by Lanell Donald Stagger, PA-C on 09/19/2023 Stage prefix: Post-therapy Response to neoadjuvant therapy: Partial response Method of lymph node assessment: Sentinel lymph node biopsy Histologic grading system: 3 grade system  Malignant neoplasm of upper-outer quadrant of left breast in female, estrogen receptor positive (HCC) Staging form: Breast, AJCC 8th Edition - Clinical: Stage IIB (cT2, cN1(f), cM0, G3, ER+, PR+, HER2-) - Signed by Lanell Donald Stagger, PA-C on 01/01/2023 Method of lymph node assessment: Core biopsy Histologic grading system: 3 grade system - Pathologic stage from 09/19/2023: ypT2, ypN1b, cM0, G2, ER+, PR+, HER2- - Signed by Lanell Donald Stagger, PA-C on 09/19/2023 Stage prefix: Post-therapy Response to neoadjuvant therapy: Partial response Method of lymph node assessment: Other Multigene prognostic tests performed: None Histologic grading system: 3 grade system    SUMMARY OF ONCOLOGIC HISTORY: Oncology History  Malignant neoplasm of overlapping sites of right breast in female, estrogen receptor negative (HCC)  01/01/2023 Cancer Staging   Staging form: Breast, AJCC 8th Edition - Clinical stage from 01/01/2023: Stage IIA (cT2, cN0, cM0, G3, ER-, PR-, HER2+) - Signed by Odean Potts, MD on 01/02/2023 Stage prefix: Initial diagnosis Method of lymph node  assessment: Clinical Histologic grading system: 3 grade system   01/25/2023 - 10/25/2023 Chemotherapy   Patient is on Treatment Plan : BREAST  Docetaxel  + Carboplatin  + Trastuzumab  + Pertuzumab   (TCHP) q21d       Genetic Testing   [facility] CancerNext+RNA was Negative. Report date is 01/16/2023.   The [facility] CancerNext+RNAinsight Panel includes sequencing, rearrangement analysis, and RNA analysis for the following 39 genes: APC, ATM, BAP1, BARD1, BMPR1A, BRCA1, BRCA2, BRIP1, CDH1, CDKN2A, CHEK2, FH, FLCN, MET, MLH1, MSH2, MSH6, MUTYH, NF1, NTHL1, PALB2, PMS2, PTEN, RAD51C, RAD51D, SMAD4, STK11, TP53, TSC1, TSC2, and VHL (sequencing and deletion/duplication); AXIN2, HOXB13, MBD4, MSH3, POLD1 and POLE (sequencing only); EPCAM and GREM1 (deletion/duplication only).    08/16/2023 Surgery   Bilateral mastectomy:  LEFT: 2.2 cm IDC, grade 2, margins negative, 3/6 SLN + macrometastases, 1 SLN micrometastases, ypT2, ypN2a, prog panel repeat ER 100%, PR 60%, Ki-67 10%, HER2 negative (1+).  RIGHT: ILC 1.8cm, margins negative, 4 SLN negative for cancer, prog panel repeat ER 0% negative, PR 0% negative, Ki-67 10%, HER2 (2+), FISH HER2 positive.     09/19/2023 Cancer Staging   Staging form: Breast, AJCC 8th Edition - Pathologic stage from 09/19/2023: ypT1c, ypN0(sn), cM0, G2, ER-, PR-, HER2+ - Signed by Lanell Donald Stagger, PA-C on 09/19/2023 Stage prefix: Post-therapy Response to neoadjuvant therapy: Partial response Method of lymph node assessment: Sentinel lymph node biopsy Histologic grading system: 3 grade system   10/16/2023 - 12/03/2023 Radiation Therapy   Adjuvant radiation   11/15/2023 -  Chemotherapy   Patient is on Treatment Plan : BREAST ADO-Trastuzumab Emtansine  (Kadcyla ) q21d     Malignant neoplasm of upper-outer quadrant of left breast in female, estrogen receptor positive (HCC)  01/01/2023 Cancer Staging   Staging  form: Breast, AJCC 8th Edition - Clinical: Stage IIB (cT2, cN1(f), cM0, G3, ER+,  PR+, HER2-) - Signed by Lanell Donald Stagger, PA-C on 01/01/2023 Method of lymph node assessment: Core biopsy Histologic grading system: 3 grade system   01/25/2023 - 10/25/2023 Chemotherapy   Patient is on Treatment Plan : BREAST  Docetaxel  + Carboplatin  + Trastuzumab  + Pertuzumab   (TCHP) q21d       Genetic Testing   [facility] CancerNext+RNA was Negative. Report date is 01/16/2023.   The [facility] CancerNext+RNAinsight Panel includes sequencing, rearrangement analysis, and RNA analysis for the following 39 genes: APC, ATM, BAP1, BARD1, BMPR1A, BRCA1, BRCA2, BRIP1, CDH1, CDKN2A, CHEK2, FH, FLCN, MET, MLH1, MSH2, MSH6, MUTYH, NF1, NTHL1, PALB2, PMS2, PTEN, RAD51C, RAD51D, SMAD4, STK11, TP53, TSC1, TSC2, and VHL (sequencing and deletion/duplication); AXIN2, HOXB13, MBD4, MSH3, POLD1 and POLE (sequencing only); EPCAM and GREM1 (deletion/duplication only).    08/16/2023 Surgery   Bilateral mastectomy:  LEFT: 2.2 cm IDC, grade 2, margins negative, 3/6 SLN + macrometastases, 1 SLN micrometastases, ypT2, ypN2a, prog panel repeat ER 100%, PR 60%, Ki-67 10%, HER2 negative (1+).  RIGHT: ILC 1.8cm, margins negative, 4 SLN negative for cancer, prog panel repeat ER 0% negative, PR 0% negative, Ki-67 10%, HER2 (2+), FISH HER2 positive.     09/19/2023 Cancer Staging   Staging form: Breast, AJCC 8th Edition - Pathologic stage from 09/19/2023: ypT2, ypN1b, cM0, G2, ER+, PR+, HER2- - Signed by Lanell Donald Stagger, PA-C on 09/19/2023 Stage prefix: Post-therapy Response to neoadjuvant therapy: Partial response Method of lymph node assessment: Other Multigene prognostic tests performed: None Histologic grading system: 3 grade system   10/16/2023 - 12/03/2023 Radiation Therapy   Adjuvant radiation   11/15/2023 -  Chemotherapy   Patient is on Treatment Plan : BREAST ADO-Trastuzumab Emtansine  (Kadcyla ) q21d       CURRENT THERAPY: [facility]ady[facility]a   INTERVAL HISTORY:  Discussed the use of AI scribe software for clinical note  transcription with the patient, who gave verbal consent to proceed.  History of Present Illness Donna [name] is a 73 year old female with bilateral breast cancer who presents for ongoing treatment with Kadcyla .  She has bilateral breast cancer with different receptor statuses: the left breast cancer is stage 2B, ER, PR positive, HER2 negative, and the right breast cancer is stage 2A, ER, PR negative, HER2 positive. She has completed neoadjuvant chemotherapy, bilateral mastectomies, and adjuvant radiation. She is currently receiving Kadcyla  every 21 days.  Her most recent echocardiogram on October 02, 2023, shows a left ventricular ejection fraction of 60 to 65%. She denies any new issues with the current treatment but feels 'a little bit of a zombie', attributing this to previous radiation side effects.  She has no new headaches, balance issues, skin problems, cough, bowel, or bladder issues.     Patient Active Problem List   Diagnosis Date Noted   ABLA (acute blood loss anemia) 08/22/2023   S/P mastectomy, bilateral 08/16/2023   Bilateral breast cancer (HCC) 08/16/2023   Port-A-Cath in place 01/24/2023   Genetic testing 01/17/2023   Malignant neoplasm of overlapping sites of right breast in female, estrogen receptor negative (HCC) 12/31/2022   Malignant neoplasm of upper-outer quadrant of left breast in female, estrogen receptor positive (HCC) 12/31/2022   Cough    Cigarette nicotine  dependence without complication    Stroke (HCC) 05/25/2020   Primary hypertension     has no known allergies.  MEDICAL HISTORY: Past Medical History:  Diagnosis Date   Allergy  Anxiety    Breast cancer (HCC)    Depression    Hyperlipidemia    no meds    Hypertension    Stroke (HCC) 05/2020   mild weakness on left side of body    SURGICAL HISTORY: Past Surgical History:  Procedure Laterality Date   AXILLARY LYMPH NODE DISSECTION Left 08/16/2023   Procedure: REDGIE HARD;   Surgeon: Curvin Deward MOULD, MD;  Location: [facility];  Service: General;  Laterality: Left;   BREAST BIOPSY Left 12/21/2022   US  LT BREAST BX W LOC DEV 1ST LESION IMG BX SPEC US  GUIDE 12/21/2022 [facility]   BREAST BIOPSY Left 12/21/2022   US  LT BREAST BX W LOC DEV EA ADD LESION IMG BX SPEC US  GUIDE 12/21/2022 [facility]   BREAST BIOPSY Right 12/26/2022   US  RT BREAST BX W LOC DEV 1ST LESION IMG BX SPEC US  GUIDE 12/26/2022 [facility]   BREAST BIOPSY Right 12/26/2022   US  RT BREAST BX W LOC DEV EA ADD LESION IMG BX SPEC US  GUIDE 12/26/2022 [facility]   BREAST BIOPSY Right 12/26/2022   MM RT BREAST BX W LOC DEV 1ST LESION IMAGE BX SPEC STEREO GUIDE 12/26/2022 [facility]   BREAST BIOPSY Left 08/15/2023   US  LT RADIOACTIVE SEED LOC 08/15/2023 [facility]   DENTAL SURGERY     INCISION AND DRAINAGE OF WOUND Right 08/21/2023   Procedure: IRRIGATION AND DEBRIDEMENT WOUND;  Surgeon: Curvin Deward MOULD, MD;  Location: [facility];  Service: General;  Laterality: Right;  WOUND WASHOUT AND CLOSURE   IR IMAGING GUIDED PORT INSERTION  01/22/2023   MASTECTOMY W/ SENTINEL NODE BIOPSY Bilateral 08/16/2023   Procedure: MASTECTOMY WITH SENTINEL LYMPH NODE BIOPSY;  Surgeon: Curvin Deward MOULD, MD;  Location: [facility];  Service: General;  Laterality: Bilateral;  GEN w/PEC BLOCK LEFT MASTY WITH SENTINEL NODE AND TARGETED NODE DISSECTION RIGHT MASTECTOMY WITH SENTINEL NODE   NSVD     x2   TUBAL LIGATION  1976    SOCIAL HISTORY: Social History   Socioeconomic History   Marital status: Divorced    Spouse name: Not on file   Number of children: Not on file   Years of education: Not on file   Highest education level: Not on file  Occupational History   Not on file  Tobacco Use   Smoking status: Every Day    Types: Cigarettes   Smokeless tobacco: Never   Tobacco comments:    8 cigs a day as of 08/13/2023  Vaping Use   Vaping status: Never Used  Substance and Sexual Activity   Alcohol  use: Yes    Comment: socially   Drug use: No   Sexual activity: Not Currently  Other Topics Concern   Not on file  Social History Narrative   Not on file   Social Drivers of Health   Financial Resource Strain: Not at Risk (04/04/2022)   Received from [employer]    Financial Resource Strain: 1  Food Insecurity: No Food Insecurity (08/16/2023)   Hunger Vital Sign    Worried About Running Out of Food in the Last Year: Never true    Ran Out of Food in the Last Year: Never true  Transportation Needs: No Transportation Needs (08/16/2023)   PRAPARE - Administrator, Civil Service (Medical): No    Lack of Transportation (Non-Medical): No  Physical Activity: Not on File (05/04/2021)   Received from [facility]   Physical  Activity    Physical Activity: 0  Stress: Not on File (05/04/2021)   Received from [facility]   Stress    Stress: 0  Social Connections: Moderately Integrated (08/16/2023)   Social Connection and Isolation Panel    Frequency of Communication with Friends and Family: More than three times a week    Frequency of Social Gatherings with Friends and Family: Three times a week    Attends Religious Services: 1 to 4 times per year    Active Member of Clubs or Organizations: Yes    Attends Banker Meetings: 1 to 4 times per year    Marital Status: Widowed  Intimate Partner Violence: Not At Risk (08/16/2023)   Humiliation, Afraid, Rape, and Kick questionnaire    Fear of Current or Ex-Partner: No    Emotionally Abused: No    Physically Abused: No    Sexually Abused: No    FAMILY HISTORY: Family History  Problem Relation Age of Onset   Colon cancer Mother 44 - 34   Cancer Father 24 - 56       unknown type   Colon polyps Sister    Esophageal cancer Sister 33 - 72   Lung cancer Sister 78 - 47   Ovarian cancer Sister 14   Colon cancer Brother 72   Prostate cancer Brother    Breast cancer Niece        dx. <50   Rectal cancer Neg Hx     Stomach cancer Neg Hx    Pancreatic cancer Neg Hx     Review of Systems  Constitutional:  Negative for appetite change, chills, fatigue, fever and unexpected weight change.  HENT:   Negative for hearing loss, lump/mass and trouble swallowing.   Eyes:  Negative for eye problems and icterus.  Respiratory:  Negative for chest tightness, cough and shortness of breath.   Cardiovascular:  Negative for chest pain, leg swelling and palpitations.  Gastrointestinal:  Negative for abdominal distention, abdominal pain, constipation, diarrhea, nausea and vomiting.  Endocrine: Negative for hot flashes.  Genitourinary:  Negative for difficulty urinating.   Musculoskeletal:  Negative for arthralgias.  Skin:  Negative for itching and rash.  Neurological:  Negative for dizziness, extremity weakness, headaches and numbness.  Hematological:  Negative for adenopathy. Does not bruise/bleed easily.  Psychiatric/Behavioral:  Negative for depression. The patient is not nervous/anxious.       PHYSICAL EXAMINATION    Vitals:   12/05/23 1306  BP: (!) 127/44  Pulse: 84  Resp: 17  Temp: 97.9 F (36.6 C)  SpO2: 98%    Physical Exam Constitutional:      General: She is not in acute distress.    Appearance: Normal appearance. She is not toxic-appearing.  HENT:     Head: Normocephalic and atraumatic.     Mouth/Throat:     Mouth: Mucous membranes are moist.     Pharynx: Oropharynx is clear. No oropharyngeal exudate or posterior oropharyngeal erythema.  Eyes:     General: No scleral icterus. Cardiovascular:     Rate and Rhythm: Normal rate and regular rhythm.     Pulses: Normal pulses.     Heart sounds: Normal heart sounds.  Pulmonary:     Effort: Pulmonary effort is normal.     Breath sounds: Normal breath sounds.  Abdominal:     General: Abdomen is flat. Bowel sounds are normal. There is no distension.     Palpations: Abdomen is soft.     Tenderness: There  is no abdominal tenderness.   Musculoskeletal:        General: No swelling.     Cervical back: Neck supple.  Lymphadenopathy:     Cervical: No cervical adenopathy.  Skin:    General: Skin is warm and dry.     Findings: No rash.  Neurological:     General: No focal deficit present.     Mental Status: She is alert.  Psychiatric:        Mood and Affect: Mood normal.        Behavior: Behavior normal.     LABORATORY DATA:  CBC    Component Value Date/Time   WBC 6.2 12/05/2023 1210   WBC 13.5 (H) 08/23/2023 0416   RBC 3.87 12/05/2023 1210   HGB 12.0 12/05/2023 1210   HCT 36.2 12/05/2023 1210   PLT 249 12/05/2023 1210   MCV 93.5 12/05/2023 1210   MCH 31.0 12/05/2023 1210   MCHC 33.1 12/05/2023 1210   RDW 14.4 12/05/2023 1210   LYMPHSABS 1.2 12/05/2023 1210   MONOABS 0.4 12/05/2023 1210   EOSABS 0.1 12/05/2023 1210   BASOSABS 0.0 12/05/2023 1210    CMP     Component Value Date/Time   NA 139 12/05/2023 1210   K 4.0 12/05/2023 1210   CL 106 12/05/2023 1210   CO2 24 12/05/2023 1210   GLUCOSE 92 12/05/2023 1210   BUN 14 12/05/2023 1210   CREATININE 0.74 12/05/2023 1210   CALCIUM  9.6 12/05/2023 1210   PROT 6.3 (L) 12/05/2023 1210   ALBUMIN  3.9 12/05/2023 1210   AST 31 12/05/2023 1210   ALT 27 12/05/2023 1210   ALKPHOS 100 12/05/2023 1210   BILITOT 0.4 12/05/2023 1210   GFRNONAA >60 12/05/2023 1210     ASSESSMENT and THERAPY PLAN:   Malignant neoplasm of overlapping sites of right breast in female, estrogen receptor negative (HCC) 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%, HER2 3+  positive   Treatment plan: Neoadjuvant TCHP x 5 cycles (stopped early for toxicities) followed by HP maintenance versus Kadcyla  maintenance 09/04/2023: Left mastectomy: Residual IDC 2.2 cm with DCIS, margins negative for invasive cancer, DCIS: Present at anterior-inferior margin, 4/6 sentinel lymph nodes positive, ER 100%, PR 60%, Ki67 10%, HER2 1+; right mastectomy: 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 I discussed the margins as well as lymph node surgeries. We also discussed the final staging along with previously performed ER/PR and HER-2/neu testing.  Treatment plan: Recommend Kadcyla  maintenance Adjuvant radiation therapy Adjuvant antiestrogen therapy and neratinib  RTC in 3 weeks for labs, f/u, and next treatment.    Assessment and Plan Assessment & Plan Bilateral breast cancer status post mastectomy on adjuvant therapy Currently receiving Kadcyla  with stable cardiac function and no new issues. Labs normal, skin condition good post-radiation. -  Continue Kadcyla  every 21 days for 10 more cycles. - Recent brain MRI results pending.      All questions were answered. The patient knows to call the clinic with any problems, questions or concerns. We can certainly see the patient much sooner if necessary.  Total encounter time:20 minutes*in face-to-face visit time, chart review, lab review, care coordination, order entry, and documentation of the encounter time.    Morna Kendall, NP 12/09/23 3:26  PM Medical Oncology and Hematology [facility] [address] [place], [place] [address] Tel. [phone]    Fax. [phone]  *Total Encounter Time as defined by the Centers for Medicare and Medicaid Services includes, in addition to the face-to-face time of a patient visit (documented in the note above) non-face-to-face time: obtaining and reviewing outside history, ordering and reviewing medications, tests or procedures, care coordination (communications with other health care professionals or caregivers) and documentation in the medical record.

## 2023-12-09 NOTE — Patient Outreach (Signed)
 Aging Gracefully Program  12/09/2023  Donna [name] [birth year] [id number]   [facility] Evaluation Interviewer made contact with patient. Aging Gracefully survey completed.   Interviewer will send referral to RN and OT for follow up.    Shereen [facility]  Population Health Care Management Assistant  Direct Dial: [phone]  Fax: [phone] Website: [email or web address]

## 2023-12-19 NOTE — Progress Notes (Signed)
  Radiation Oncology         [phone] ________________________________  Name: Donna [name] MRN: [id number]  Date of Service: 12/30/2023  DOB: [birth year]  Post Treatment Telephone Note  Diagnosis:  Diagnosis: C50.412 Malignant neoplasm of upper-outer quadrant of left female breast Staging on 2023-09-19: Malignant neoplasm of overlapping sites of right breast in female, estrogen receptor negative (HCC) T=pT1c, N=pN0, M=cM0 Staging on 2023-01-01: Malignant neoplasm of overlapping sites of right breast in female, estrogen receptor negative (HCC) T=cT2, N=cN0, M=cM0 Staging on 2023-09-19: Malignant neoplasm of upper-outer quadrant of left breast in female, estrogen receptor positive (HCC) T=pT2, N=pN1b, M=cM0 Staging on 2023-01-01: Malignant neoplasm of upper-outer quadrant of left breast in female, estrogen receptor positive (HCC) T=cT2, N=cN1, M=cM0 Intent: Curative   First Treatment Date: 2023-10-16 Last Treatment Date: 2023-12-04   Plan Name: CW_L_BH_BO Site: Chest Wall, Left Technique: 3D Mode: Photon Dose Per Fraction: 1.8 Gy Prescribed Dose (Delivered / Prescribed): 25.2 Gy / 25.2 Gy Prescribed Fxs (Delivered / Prescribed): 14 / 14   Plan Name: SCV_L_BH Site: Chest Wall, Left Technique: 3D Mode: Photon Dose Per Fraction: 1.8 Gy Prescribed Dose (Delivered / Prescribed): 50.4 Gy / 50.4 Gy Prescribed Fxs (Delivered / Prescribed): 28 / 28   Plan Name: CW_L_Bst_BO Site: Chest Wall, Left Technique: Electron Mode: Electron Dose Per Fraction: 2 Gy Prescribed Dose (Delivered / Prescribed): 10 Gy / 10 Gy Prescribed Fxs (Delivered / Prescribed): 5 / 5   Plan Name: CW_L_BH Site: Chest Wall, Left Technique: 3D Mode: Photon Dose Per Fraction: 1.8 Gy Prescribed Dose (Delivered / Prescribed): 25.2 Gy / 25.2 Gy Prescribed Fxs (Delivered / Prescribed): 14 / 14      The patient {WAS/WAS NOT:[phone]::was not} available for call today.   Symptoms of fatigue {ACTIONS;  HAVE/HAVE NOT:19434} improved since completing therapy.  Symptoms of skin changes {ACTIONS; HAVE/HAVE NOT:19434} improved since completing therapy.  The patient was encouraged to avoid sun exposure in the area of prior treatment for up to one year following radiation with either sunscreen or by the style of clothing worn in the sun.  The patient has scheduled follow up with her medical oncologist Dr. Gudena for ongoing surveillance, and was encouraged to call if she develops concerns or questions regarding radiation.

## 2023-12-20 ENCOUNTER — Encounter: Admitting: Rehabilitation

## 2023-12-24 NOTE — Addendum Note (Signed)
 Encounter addended by: Lanell Donald Stagger, PA-C on: 12/24/2023 11:25 AM  Actions taken: Clinical Note Signed

## 2023-12-26 ENCOUNTER — Inpatient Hospital Stay

## 2023-12-26 ENCOUNTER — Inpatient Hospital Stay: Attending: Hematology and Oncology

## 2023-12-26 ENCOUNTER — Inpatient Hospital Stay: Attending: Hematology and Oncology | Admitting: Adult Health

## 2023-12-26 ENCOUNTER — Encounter: Payer: Self-pay | Admitting: Adult Health

## 2023-12-26 VITALS — BP 102/80 | HR 89 | Temp 98.2°F | Resp 16

## 2023-12-26 VITALS — BP 161/58 | HR 75 | Temp 97.3°F | Resp 18 | Wt 153.3 lb

## 2023-12-26 DIAGNOSIS — Z8673 Personal history of transient ischemic attack (TIA), and cerebral infarction without residual deficits: Secondary | ICD-10-CM | POA: Insufficient documentation

## 2023-12-26 DIAGNOSIS — Z8041 Family history of malignant neoplasm of ovary: Secondary | ICD-10-CM | POA: Insufficient documentation

## 2023-12-26 DIAGNOSIS — Z1731 Human epidermal growth factor receptor 2 positive status: Secondary | ICD-10-CM | POA: Insufficient documentation

## 2023-12-26 DIAGNOSIS — F17210 Nicotine dependence, cigarettes, uncomplicated: Secondary | ICD-10-CM | POA: Insufficient documentation

## 2023-12-26 DIAGNOSIS — Z5112 Encounter for antineoplastic immunotherapy: Secondary | ICD-10-CM | POA: Diagnosis present

## 2023-12-26 DIAGNOSIS — Z170 Estrogen receptor positive status [ER+]: Secondary | ICD-10-CM | POA: Diagnosis not present

## 2023-12-26 DIAGNOSIS — Z171 Estrogen receptor negative status [ER-]: Secondary | ICD-10-CM

## 2023-12-26 DIAGNOSIS — Z803 Family history of malignant neoplasm of breast: Secondary | ICD-10-CM | POA: Insufficient documentation

## 2023-12-26 DIAGNOSIS — Z1722 Progesterone receptor negative status: Secondary | ICD-10-CM | POA: Insufficient documentation

## 2023-12-26 DIAGNOSIS — C50412 Malignant neoplasm of upper-outer quadrant of left female breast: Secondary | ICD-10-CM | POA: Diagnosis not present

## 2023-12-26 DIAGNOSIS — M62838 Other muscle spasm: Secondary | ICD-10-CM

## 2023-12-26 DIAGNOSIS — Z9013 Acquired absence of bilateral breasts and nipples: Secondary | ICD-10-CM | POA: Diagnosis not present

## 2023-12-26 DIAGNOSIS — Z83719 Family history of colon polyps, unspecified: Secondary | ICD-10-CM | POA: Diagnosis not present

## 2023-12-26 DIAGNOSIS — C50811 Malignant neoplasm of overlapping sites of right female breast: Secondary | ICD-10-CM | POA: Insufficient documentation

## 2023-12-26 DIAGNOSIS — Z800 Family history of malignant neoplasm of digestive organs: Secondary | ICD-10-CM | POA: Diagnosis not present

## 2023-12-26 DIAGNOSIS — Z801 Family history of malignant neoplasm of trachea, bronchus and lung: Secondary | ICD-10-CM | POA: Diagnosis not present

## 2023-12-26 LAB — CBC WITH DIFFERENTIAL (CANCER CENTER ONLY)
Abs Immature Granulocytes: 0.02 K/uL (ref 0.00–0.07)
Basophils Absolute: 0 K/uL (ref 0.0–0.1)
Basophils Relative: 0 %
Eosinophils Absolute: 0.1 K/uL (ref 0.0–0.5)
Eosinophils Relative: 1 %
HCT: 37.3 % (ref 36.0–46.0)
Hemoglobin: 12.2 g/dL (ref 12.0–15.0)
Immature Granulocytes: 0 %
Lymphocytes Relative: 21 %
Lymphs Abs: 1.5 K/uL (ref 0.7–4.0)
MCH: 30.3 pg (ref 26.0–34.0)
MCHC: 32.7 g/dL (ref 30.0–36.0)
MCV: 92.6 fL (ref 80.0–100.0)
Monocytes Absolute: 0.5 K/uL (ref 0.1–1.0)
Monocytes Relative: 6 %
Neutro Abs: 5.3 K/uL (ref 1.7–7.7)
Neutrophils Relative %: 72 %
Platelet Count: 226 K/uL (ref 150–400)
RBC: 4.03 MIL/uL (ref 3.87–5.11)
RDW: 14.5 % (ref 11.5–15.5)
WBC Count: 7.4 K/uL (ref 4.0–10.5)
nRBC: 0 % (ref 0.0–0.2)

## 2023-12-26 LAB — CMP (CANCER CENTER ONLY)
ALT: 10 U/L (ref 0–44)
AST: 26 U/L (ref 15–41)
Albumin: 3.8 g/dL (ref 3.5–5.0)
Alkaline Phosphatase: 91 U/L (ref 38–126)
Anion gap: 8 (ref 5–15)
BUN: 10 mg/dL (ref 8–23)
CO2: 25 mmol/L (ref 22–32)
Calcium: 9.4 mg/dL (ref 8.9–10.3)
Chloride: 108 mmol/L (ref 98–111)
Creatinine: 1.03 mg/dL — ABNORMAL HIGH (ref 0.44–1.00)
GFR, Estimated: 57 mL/min — ABNORMAL LOW (ref >60)
Glucose, Bld: 91 mg/dL (ref 70–99)
Potassium: 3.6 mmol/L (ref 3.5–5.1)
Sodium: 141 mmol/L (ref 135–145)
Total Bilirubin: 0.5 mg/dL (ref 0.0–1.2)
Total Protein: 6.7 g/dL (ref 6.5–8.1)

## 2023-12-26 MED ORDER — PROCHLORPERAZINE MALEATE 10 MG PO TABS
10.0000 mg | ORAL_TABLET | Freq: Once | ORAL | Status: AC
  Administered 2023-12-26: 10 mg via ORAL
  Filled 2023-12-26: qty 1

## 2023-12-26 MED ORDER — CYCLOBENZAPRINE HCL 5 MG PO TABS: 5.0000 mg | ORAL_TABLET | Freq: Two times a day (BID) | ORAL | 0 refills | Status: DC | PRN

## 2023-12-26 MED ORDER — SODIUM CHLORIDE 0.9 % IV SOLN
3.0000 mg/kg | Freq: Once | INTRAVENOUS | Status: AC
  Administered 2023-12-26: 200 mg via INTRAVENOUS
  Filled 2023-12-26: qty 10

## 2023-12-26 MED ORDER — ACETAMINOPHEN 325 MG PO TABS
650.0000 mg | ORAL_TABLET | Freq: Once | ORAL | Status: AC
  Administered 2023-12-26: 650 mg via ORAL
  Filled 2023-12-26: qty 2

## 2023-12-26 MED ORDER — NICOTINE 14 MG/24HR TD PT24: 14.0000 mg | MEDICATED_PATCH | Freq: Every day | TRANSDERMAL | 0 refills | Status: AC

## 2023-12-26 MED ORDER — SODIUM CHLORIDE 0.9 % IV SOLN: INTRAVENOUS | Status: DC

## 2023-12-26 MED ORDER — DIPHENHYDRAMINE HCL 25 MG PO CAPS
25.0000 mg | ORAL_CAPSULE | Freq: Once | ORAL | Status: AC
  Administered 2023-12-26: 25 mg via ORAL
  Filled 2023-12-26: qty 1

## 2023-12-26 NOTE — Progress Notes (Signed)
 [facility] Cancer Follow up:    [name], FNP [address] [place] [address]   DIAGNOSIS:  Cancer Staging  Malignant neoplasm of overlapping sites of right breast in female, estrogen receptor negative (HCC) Staging form: Breast, AJCC 8th Edition - Clinical stage from 01/01/2023: Stage IIA (cT2, cN0, cM0, G3, ER-, PR-, HER2+) - Signed by Odean Potts, MD on 01/02/2023 Stage prefix: Initial diagnosis Method of lymph node assessment: Clinical Histologic grading system: 3 grade system - Pathologic stage from 09/19/2023: ypT1c, ypN0(sn), cM0, G2, ER-, PR-, HER2+ - Signed by Lanell Donald Stagger, PA-C on 09/19/2023 Stage prefix: Post-therapy Response to neoadjuvant therapy: Partial response Method of lymph node assessment: Sentinel lymph node biopsy Histologic grading system: 3 grade system  Malignant neoplasm of upper-outer quadrant of left breast in female, estrogen receptor positive (HCC) Staging form: Breast, AJCC 8th Edition - Clinical: Stage IIB (cT2, cN1(f), cM0, G3, ER+, PR+, HER2-) - Signed by Lanell Donald Stagger, PA-C on 01/01/2023 Method of lymph node assessment: Core biopsy Histologic grading system: 3 grade system - Pathologic stage from 09/19/2023: ypT2, ypN1b, cM0, G2, ER+, PR+, HER2- - Signed by Lanell Donald Stagger, PA-C on 09/19/2023 Stage prefix: Post-therapy Response to neoadjuvant therapy: Partial response Method of lymph node assessment: Other Multigene prognostic tests performed: None Histologic grading system: 3 grade system    SUMMARY OF ONCOLOGIC HISTORY: Oncology History  Malignant neoplasm of overlapping sites of right breast in female, estrogen receptor negative (HCC)  01/01/2023 Cancer Staging   Staging form: Breast, AJCC 8th Edition - Clinical stage from 01/01/2023: Stage IIA (cT2, cN0, cM0, G3, ER-, PR-, HER2+) - Signed by Odean Potts, MD on 01/02/2023 Stage prefix: Initial diagnosis Method of lymph node  assessment: Clinical Histologic grading system: 3 grade system   01/25/2023 - 10/25/2023 Chemotherapy   Patient is on Treatment Plan : BREAST  Docetaxel  + Carboplatin  + Trastuzumab  + Pertuzumab   (TCHP) q21d       Genetic Testing   [facility] CancerNext+RNA was Negative. Report date is 01/16/2023.   The [facility] CancerNext+RNAinsight Panel includes sequencing, rearrangement analysis, and RNA analysis for the following 39 genes: APC, ATM, BAP1, BARD1, BMPR1A, BRCA1, BRCA2, BRIP1, CDH1, CDKN2A, CHEK2, FH, FLCN, MET, MLH1, MSH2, MSH6, MUTYH, NF1, NTHL1, PALB2, PMS2, PTEN, RAD51C, RAD51D, SMAD4, STK11, TP53, TSC1, TSC2, and VHL (sequencing and deletion/duplication); AXIN2, HOXB13, MBD4, MSH3, POLD1 and POLE (sequencing only); EPCAM and GREM1 (deletion/duplication only).    08/16/2023 Surgery   Bilateral mastectomy:  LEFT: 2.2 cm IDC, grade 2, margins negative, 3/6 SLN + macrometastases, 1 SLN micrometastases, ypT2, ypN2a, prog panel repeat ER 100%, PR 60%, Ki-67 10%, HER2 negative (1+).  RIGHT: ILC 1.8cm, margins negative, 4 SLN negative for cancer, prog panel repeat ER 0% negative, PR 0% negative, Ki-67 10%, HER2 (2+), FISH HER2 positive.     09/19/2023 Cancer Staging   Staging form: Breast, AJCC 8th Edition - Pathologic stage from 09/19/2023: ypT1c, ypN0(sn), cM0, G2, ER-, PR-, HER2+ - Signed by Lanell Donald Stagger, PA-C on 09/19/2023 Stage prefix: Post-therapy Response to neoadjuvant therapy: Partial response Method of lymph node assessment: Sentinel lymph node biopsy Histologic grading system: 3 grade system   10/16/2023 - 12/03/2023 Radiation Therapy   Adjuvant radiation   11/15/2023 -  Chemotherapy   Patient is on Treatment Plan : BREAST ADO-Trastuzumab Emtansine  (Kadcyla ) q21d     Malignant neoplasm of upper-outer quadrant of left breast in female, estrogen receptor positive (HCC)  01/01/2023 Cancer Staging   Staging  form: Breast, AJCC 8th Edition - Clinical: Stage IIB (cT2, cN1(f), cM0, G3, ER+,  PR+, HER2-) - Signed by Lanell Donald Stagger, PA-C on 01/01/2023 Method of lymph node assessment: Core biopsy Histologic grading system: 3 grade system   01/25/2023 - 10/25/2023 Chemotherapy   Patient is on Treatment Plan : BREAST  Docetaxel  + Carboplatin  + Trastuzumab  + Pertuzumab   (TCHP) q21d       Genetic Testing   [facility] CancerNext+RNA was Negative. Report date is 01/16/2023.   The [facility] CancerNext+RNAinsight Panel includes sequencing, rearrangement analysis, and RNA analysis for the following 39 genes: APC, ATM, BAP1, BARD1, BMPR1A, BRCA1, BRCA2, BRIP1, CDH1, CDKN2A, CHEK2, FH, FLCN, MET, MLH1, MSH2, MSH6, MUTYH, NF1, NTHL1, PALB2, PMS2, PTEN, RAD51C, RAD51D, SMAD4, STK11, TP53, TSC1, TSC2, and VHL (sequencing and deletion/duplication); AXIN2, HOXB13, MBD4, MSH3, POLD1 and POLE (sequencing only); EPCAM and GREM1 (deletion/duplication only).    08/16/2023 Surgery   Bilateral mastectomy:  LEFT: 2.2 cm IDC, grade 2, margins negative, 3/6 SLN + macrometastases, 1 SLN micrometastases, ypT2, ypN2a, prog panel repeat ER 100%, PR 60%, Ki-67 10%, HER2 negative (1+).  RIGHT: ILC 1.8cm, margins negative, 4 SLN negative for cancer, prog panel repeat ER 0% negative, PR 0% negative, Ki-67 10%, HER2 (2+), FISH HER2 positive.     09/19/2023 Cancer Staging   Staging form: Breast, AJCC 8th Edition - Pathologic stage from 09/19/2023: ypT2, ypN1b, cM0, G2, ER+, PR+, HER2- - Signed by Lanell Donald Stagger, PA-C on 09/19/2023 Stage prefix: Post-therapy Response to neoadjuvant therapy: Partial response Method of lymph node assessment: Other Multigene prognostic tests performed: None Histologic grading system: 3 grade system   10/16/2023 - 12/03/2023 Radiation Therapy   Adjuvant radiation   11/15/2023 -  Chemotherapy   Patient is on Treatment Plan : BREAST ADO-Trastuzumab Emtansine  (Kadcyla ) q21d       CURRENT THERAPY: Kadcyla   INTERVAL HISTORY:  Nevae is here for f/u prior to Kadcyla .  She is mildly  fatigued, but denies any other issues.  She is tolerating treatment well and has no chest pain, cough, shortness of breath, or other issues.  Her most recent echo occurred on 10/02/2023 and demonstrated EF of 60-65% and next echo is due 01/03/2024.SABRA     Patient Active Problem List   Diagnosis Date Noted   ABLA (acute blood loss anemia) 08/22/2023   S/P mastectomy, bilateral 08/16/2023   Bilateral breast cancer (HCC) 08/16/2023   Port-A-Cath in place 01/24/2023   Genetic testing 01/17/2023   Malignant neoplasm of overlapping sites of right breast in female, estrogen receptor negative (HCC) 12/31/2022   Malignant neoplasm of upper-outer quadrant of left breast in female, estrogen receptor positive (HCC) 12/31/2022   Cough    Cigarette nicotine  dependence without complication    Stroke (HCC) 05/25/2020   Primary hypertension     has no known allergies.  MEDICAL HISTORY: Past Medical History:  Diagnosis Date   Allergy    Anxiety    Breast cancer (HCC)    Depression    Hyperlipidemia    no meds    Hypertension    Stroke (HCC) 05/2020   mild weakness on left side of body    SURGICAL HISTORY: Past Surgical History:  Procedure Laterality Date   AXILLARY LYMPH NODE DISSECTION Left 08/16/2023   Procedure: REDGIE HARD;  Surgeon: Curvin Deward MOULD, MD;  Location: [facility] OR;  Service: General;  Laterality: Left;   BREAST BIOPSY Left 12/21/2022   US  LT BREAST BX W LOC DEV 1ST LESION IMG BX SPEC  US  GUIDE 12/21/2022 [facility]   BREAST BIOPSY Left 12/21/2022   US  LT BREAST BX W LOC DEV EA ADD LESION IMG BX SPEC US  GUIDE 12/21/2022 [facility]   BREAST BIOPSY Right 12/26/2022   US  RT BREAST BX W LOC DEV 1ST LESION IMG BX SPEC US  GUIDE 12/26/2022 [facility]   BREAST BIOPSY Right 12/26/2022   US  RT BREAST BX W LOC DEV EA ADD LESION IMG BX SPEC US  GUIDE 12/26/2022 [facility]   BREAST BIOPSY Right 12/26/2022   MM RT BREAST BX W LOC DEV 1ST LESION IMAGE BX  SPEC STEREO GUIDE 12/26/2022 [facility]   BREAST BIOPSY Left 08/15/2023   US  LT RADIOACTIVE SEED LOC 08/15/2023 [facility]   DENTAL SURGERY     INCISION AND DRAINAGE OF WOUND Right 08/21/2023   Procedure: IRRIGATION AND DEBRIDEMENT WOUND;  Surgeon: Curvin Deward MOULD, MD;  Location: [facility] OR;  Service: General;  Laterality: Right;  WOUND WASHOUT AND CLOSURE   IR IMAGING GUIDED PORT INSERTION  01/22/2023   MASTECTOMY W/ SENTINEL NODE BIOPSY Bilateral 08/16/2023   Procedure: MASTECTOMY WITH SENTINEL LYMPH NODE BIOPSY;  Surgeon: Curvin Deward MOULD, MD;  Location: [facility] OR;  Service: General;  Laterality: Bilateral;  GEN w/PEC BLOCK LEFT MASTY WITH SENTINEL NODE AND TARGETED NODE DISSECTION RIGHT MASTECTOMY WITH SENTINEL NODE   NSVD     x2   TUBAL LIGATION  1976    SOCIAL HISTORY: Social History   Socioeconomic History   Marital status: Divorced    Spouse name: Not on file   Number of children: Not on file   Years of education: Not on file   Highest education level: Not on file  Occupational History   Not on file  Tobacco Use   Smoking status: Every Day    Types: Cigarettes   Smokeless tobacco: Never   Tobacco comments:    8 cigs a day as of 08/13/2023  Vaping Use   Vaping status: Never Used  Substance and Sexual Activity   Alcohol use: Yes    Comment: socially   Drug use: No   Sexual activity: Not Currently  Other Topics Concern   Not on file  Social History Narrative   Not on file   Social Drivers of Health   Tobacco Use: High Risk (12/26/2023)   Patient History    Smoking Tobacco Use: Every Day    Smokeless Tobacco Use: Never    Passive Exposure: Not on file  Financial Resource Strain: Not at Risk (04/04/2022)   Received from [employer]    Financial Resource Strain: 1  Food Insecurity: No Food Insecurity (08/16/2023)   Epic    Worried About Programme Researcher, Broadcasting/film/video in the Last Year: Never true    [employer] of Food in the Last Year: Never true   Transportation Needs: No Transportation Needs (08/16/2023)   Epic    Lack of Transportation (Medical): No    Lack of Transportation (Non-Medical): No  Physical Activity: Not on File (05/04/2021)   [facility]   Physical Activity    Physical Activity: 0  Stress: Not on File (05/04/2021)   Received from [facility]   Stress    Stress: 0  Social Connections: Moderately Integrated (08/16/2023)   Social Connection and Isolation Panel    Frequency of Communication with Friends and Family: More than three times a week    Frequency of Social Gatherings with Friends and Family: Three times a week  Attends Religious Services: 1 to 4 times per year    Active Member of Clubs or Organizations: Yes    Attends Banker Meetings: 1 to 4 times per year    Marital Status: Widowed  Intimate Partner Violence: Not At Risk (08/16/2023)   Epic    Fear of Current or Ex-Partner: No    Emotionally Abused: No    Physically Abused: No    Sexually Abused: No  Depression (PHQ2-9): High Risk (12/09/2023)   Depression (PHQ2-9)    PHQ-2 Score: 13  Alcohol Screen: Not on file  Housing: Low Risk (08/16/2023)   Epic    Unable to Pay for Housing in the Last Year: No    Number of Times Moved in the Last Year: 0    Homeless in the Last Year: No  Utilities: Not At Risk (08/16/2023)   Epic    Threatened with loss of utilities: No  Health Literacy: Not on file    FAMILY HISTORY: Family History  Problem Relation Age of Onset   Colon cancer Mother 62 - 19   Cancer Father 24 - 78       unknown type   Colon polyps Sister    Esophageal cancer Sister 65 - 73   Lung cancer Sister 19 - 57   Ovarian cancer Sister 50   Colon cancer Brother 56   Prostate cancer Brother    Breast cancer Niece        dx. <50   Rectal cancer Neg Hx    Stomach cancer Neg Hx    Pancreatic cancer Neg Hx     Review of Systems  Constitutional:  Positive for fatigue. Negative for appetite change, chills, fever and unexpected  weight change.  HENT:   Negative for hearing loss, lump/mass and trouble swallowing.   Eyes:  Negative for eye problems and icterus.  Respiratory:  Negative for chest tightness, cough and shortness of breath.   Cardiovascular:  Negative for chest pain, leg swelling and palpitations.  Gastrointestinal:  Negative for abdominal distention, abdominal pain, constipation, diarrhea, nausea and vomiting.  Endocrine: Negative for hot flashes.  Genitourinary:  Negative for difficulty urinating.   Musculoskeletal:  Negative for arthralgias.  Skin:  Negative for itching and rash.  Neurological:  Negative for dizziness, extremity weakness, headaches and numbness.  Hematological:  Negative for adenopathy. Does not bruise/bleed easily.  Psychiatric/Behavioral:  Negative for depression. The patient is not nervous/anxious.       PHYSICAL EXAMINATION    Vitals:   12/26/23 1335  BP: (!) 161/58  Pulse: 75  Resp: 18  Temp: (!) 97.3 F (36.3 C)  SpO2: 96%    Physical Exam Constitutional:      General: She is not in acute distress.    Appearance: Normal appearance. She is not toxic-appearing.  HENT:     Head: Normocephalic and atraumatic.     Mouth/Throat:     Mouth: Mucous membranes are moist.     Pharynx: Oropharynx is clear. No oropharyngeal exudate or posterior oropharyngeal erythema.  Eyes:     General: No scleral icterus. Cardiovascular:     Rate and Rhythm: Normal rate and regular rhythm.     Pulses: Normal pulses.     Heart sounds: Normal heart sounds.  Pulmonary:     Effort: Pulmonary effort is normal.     Breath sounds: Normal breath sounds.  Abdominal:     General: Abdomen is flat. Bowel sounds are normal. There is no distension.  Palpations: Abdomen is soft.     Tenderness: There is no abdominal tenderness.  Musculoskeletal:        General: No swelling.     Cervical back: Neck supple.  Lymphadenopathy:     Cervical: No cervical adenopathy.  Skin:    General: Skin is  warm and dry.     Findings: No rash.  Neurological:     General: No focal deficit present.     Mental Status: She is alert.  Psychiatric:        Mood and Affect: Mood normal.        Behavior: Behavior normal.     LABORATORY DATA:  CBC    Component Value Date/Time   WBC 7.4 12/26/2023 1258   WBC 13.5 (H) 08/23/2023 0416   RBC 4.03 12/26/2023 1258   HGB 12.2 12/26/2023 1258   HCT 37.3 12/26/2023 1258   PLT 226 12/26/2023 1258   MCV 92.6 12/26/2023 1258   MCH 30.3 12/26/2023 1258   MCHC 32.7 12/26/2023 1258   RDW 14.5 12/26/2023 1258   LYMPHSABS 1.5 12/26/2023 1258   MONOABS 0.5 12/26/2023 1258   EOSABS 0.1 12/26/2023 1258   BASOSABS 0.0 12/26/2023 1258    CMP     Component Value Date/Time   NA 141 12/26/2023 1258   K 3.6 12/26/2023 1258   CL 108 12/26/2023 1258   CO2 25 12/26/2023 1258   GLUCOSE 91 12/26/2023 1258   BUN 10 12/26/2023 1258   CREATININE 1.03 (H) 12/26/2023 1258   CALCIUM  9.4 12/26/2023 1258   PROT 6.7 12/26/2023 1258   ALBUMIN  3.8 12/26/2023 1258   AST 26 12/26/2023 1258   ALT 10 12/26/2023 1258   ALKPHOS 91 12/26/2023 1258   BILITOT 0.5 12/26/2023 1258   GFRNONAA 57 (L) 12/26/2023 1258     ASSESSMENT and THERAPY PLAN:   Malignant neoplasm of overlapping sites of right breast in female, estrogen receptor negative (HCC) 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%, HER2 3+ positive   Treatment plan: Neoadjuvant TCHP x 5 cycles (stopped early for toxicities) followed by HP maintenance versus Kadcyla  maintenance 09/04/2023: Left  mastectomy: Residual IDC 2.2 cm with DCIS, margins negative for invasive cancer, DCIS: Present at anterior-inferior margin, 4/6 sentinel lymph nodes positive, ER 100%, PR 60%, Ki67 10%, HER2 1+; right mastectomy: 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 I discussed the margins as well as lymph node surgeries. We also discussed the final staging along with previously performed ER/PR and HER-2/neu testing.  Treatment plan: Recommend Kadcyla  maintenance Adjuvant radiation therapy Adjuvant antiestrogen therapy and neratinib  Tolerating treatment well.  Recommended energy conservation for fatigue.  Labs stable and reviewed with Vivyan in detail.  Echo due next week.    RTC in 3 weeks for labs, f/u, and next  treatment.    All questions were answered. The patient knows to call the clinic with any problems, questions or concerns. We can certainly see the patient much sooner if necessary.  Total encounter time:20 minutes*in face-to-face visit time, chart review, lab review, care coordination, order entry, and documentation of the encounter time.    Morna Kendall, NP 01/01/2024 12:03 PM Medical Oncology and Hematology [facility] [address] [place], [place] [address] Tel. [phone]    Fax. [phone]  *Total Encounter Time as defined by the Centers for Medicare and Medicaid Services includes, in addition to the face-to-face time of a patient visit (documented in the note above) non-face-to-face time:  obtaining and reviewing outside history, ordering and reviewing medications, tests or procedures, care coordination (communications with other health care professionals or caregivers) and documentation in the medical record.

## 2023-12-26 NOTE — Progress Notes (Signed)
 Pt declined to stay for 30 minute post- Kadcyla  observation. VSS, AVS reviewed, and ambulatory to lobby with cane.

## 2023-12-26 NOTE — Patient Instructions (Signed)
 [facility] - A DEPT OF [facility]  Discharge Instructions: Thank you for choosing [facility] to provide your oncology and hematology care.   If you have a lab appointment with the Cancer Center, please go directly to the Cancer Center and check in at the registration area.   Wear comfortable clothing and clothing appropriate for easy access to any Portacath or PICC line.   We strive to give you quality time with your provider. You may need to reschedule your appointment if you arrive late (15 or more minutes).  Arriving late affects you and other patients whose appointments are after yours.  Also, if you miss three or more appointments without notifying the office, you may be dismissed from the clinic at the provider's discretion.      For prescription refill requests, have your pharmacy contact our office and allow 72 hours for refills to be completed.    Today you received the following chemotherapy and/or immunotherapy agents: Ado-trastuzumab emtansine  (Kadcyla )    To help prevent nausea and vomiting after your treatment, we encourage you to take your nausea medication as directed.  BELOW ARE SYMPTOMS THAT SHOULD BE REPORTED IMMEDIATELY: *FEVER GREATER THAN 100.4 F (38 C) OR HIGHER *CHILLS OR SWEATING *NAUSEA AND VOMITING THAT IS NOT CONTROLLED WITH YOUR NAUSEA MEDICATION *UNUSUAL SHORTNESS OF BREATH *UNUSUAL BRUISING OR BLEEDING *URINARY PROBLEMS (pain or burning when urinating, or frequent urination) *BOWEL PROBLEMS (unusual diarrhea, constipation, pain near the anus) TENDERNESS IN MOUTH AND THROAT WITH OR WITHOUT PRESENCE OF ULCERS (sore throat, sores in mouth, or a toothache) UNUSUAL RASH, SWELLING OR PAIN  UNUSUAL VAGINAL DISCHARGE OR ITCHING   Items with * indicate a potential emergency and should be followed up as soon as possible or go to the Emergency Department if any problems should occur.  Please show the CHEMOTHERAPY  ALERT CARD or IMMUNOTHERAPY ALERT CARD at check-in to the Emergency Department and triage nurse.  Should you have questions after your visit or need to cancel or reschedule your appointment, please contact [facility] - A DEPT OF [facility]  Dept: [phone]  and follow the prompts.  Office hours are 8:00 a.m. to 4:30 p.m. Monday - Friday. Please note that voicemails left after 4:00 p.m. may not be returned until the following business day.  We are closed weekends and major holidays. You have access to a nurse at all times for urgent questions. Please call the main number to the clinic Dept: [phone] and follow the prompts.   For any non-urgent questions, you may also contact your provider using MyChart. We now offer e-Visits for anyone 71 and older to request care online for non-urgent symptoms. For details visit [email or web address].   Also download the MyChart app! Go to the app store, search MyChart, open the app, select [place], and log in with your MyChart username and password.

## 2023-12-27 ENCOUNTER — Other Ambulatory Visit: Payer: Self-pay | Admitting: Rehabilitation

## 2023-12-28 NOTE — Patient Outreach (Signed)
 Aging Gracefully Program  OT Initial Visit  12/28/2023  Donna [name] 05-27-1950 [id number]  Visit:  1- Initial Visit  Start Time:  1400 End Time:  1550 Total Minutes:  110  CCAP: Typical Daily Routine: Typical Daily Routine:: Varies due to cancer treatment and body response. Some days more clarity than other days What Types Of Care Problems Are You Having Throughout The Day?: difficulty in the kitchen no hot water at the sink, fatigue, concern for falls is careful when walking. What Kind Of Help Do You Receive?: family support Do You Think You Need Other Types Of Help?: no What Do You Think Would Make Everyday Life Easier For You?: house improvements What Is A Good Day Like?: clarity and more energy What Is A Bad Day Like?: no clarity, fatigue Do You Have Time For Yourself?: yes Patient Reported Equipment: Patient Reported Equipment Currently Used: Raised Toilet Seat, Rollator, Tub Bench (HurryCane) Functional Mobility-Walking Indoors/Getting Around the House: Walking Indoors/Getting Around [employer]: Moderate Difficulty Do You:: Use A Device Importance Of Learning New Strategies:: A Little Observation: Walking Indoors/Getting Around The House: Independent With Pain, Difficulty, Or Use Of Device Safety: A Little Risk Efficiency: Somewhat Intervention: Yes Functional Mobility-Walk A Block: Walk A Block: Moderate Difficulty Do You:: Use A Device Importance Of Learning New Strategies:: A Little Other Comments:: uses rollator, sits and rests periodically Functional Mobility-Maintain Balance While Showering: Maintaining Balance While Showering: A Lot Of Difficulty Do You:: Use A Device Importance Of Learning New Strategies:: Moderate Observation: Maintain Balance While Showering: Independent With Pain, Difficulty, Or Use Of Device Safety: A Little Risk Efficiency: Somewhat Intervention: Yes Other Comments:: has tub bench, would lilke to stand in shower stall in  bedroom. no grab bars Functional Mobility-Stooping, Crouching, Kneeling To Retreive Item: Stooping, Crouching, or Kneeling To Retrieve Item: A Lot Of Difficulty Do You:: No Device/No Assistance Importance Of Learning New Strategies:: Moderate Functional Mobility-Bending From Standing Position To Pick Up Clothing Off The Floor: Bending Over From Standing Position To Pick Up Clothing Off The Floor: Moderate Difficulty Do You:: No Device/No Assistance Importance Of Learning New Strategies:: Moderate Observation: Bending Over From Standing Postion To Pick Up Clothing Off Of Floor: Independent With Pain, Difficulty, Or Use Of Device Safety: A Little Risk Efficiency: Somewhat Intervention: Yes Other Comments:: does not have a reacher, stabilizes self on rollator or furniture Functional Mobility-Reaching For Items Above Shoulder Level: Reaching For Items Above Shoulder Level: No Difficulty Do You:: No Device/No Assistance Importance Of Learning New Strategies:: Not At All Functional Mobility-Climb 1 Flight Of Stairs: Climb 1 Flight Of Stairs: A Lot Of Difficulty Do You:: No Device/No Assistance Importance Of Learning New Strategies:: Not At All Other Comments:: only 3 stairs into home Functional Mobility-Move In And Out Of Chair: Move In and Out Of A Chair: A Little Difficulty Do You:: No Device/No Assistance Importance Of Learning New Strategies:: Not At All Other Comments:: takes her time, arm rests are helpful Functional Mobility-Move In And Out Of Bed: Move In and Out Of Bed: A Little Difficulty Do You:: Use A Device Importance Of Learning New Strategies:: Not At All Other Comments:: uses a large step, reports she manages independently Functional Mobility-Move In And Out Of Bath/Shower: Move In And Out Of A Bath/Shower: A Little Difficulty Do You:: Use A Device Importance Of Learning New Strategies:: A Little Observation: Move In And Out Of Bath/Shower: N/O Safety: A Little  Risk Efficiency: Somewhat Intervention: Yes Other Comments:: uses tub bench, right next to  commode hand rail and tight space. but is efficient and client is satisfied Functional Mobility-Get On And Off Toilet: Getting Up From The Floor: A Lot Of Difficulty Do You:: No Device/No Assistance Importance Of Learning New Strategies:: A Little Functional Mobility-Into And Out Of Car, Not Including Driving: Into  And Out Of Car, Not Including Driving: A Little Difficulty Do You:: Use Personal Assistance Importance Of Learning New Strategies:: Not At All   Activities of Daily Living-Bathing/Showering: ADL-Bathing/Showering: Moderate Difficulty Do You:: No Device/No Assistance Importance Of Learning New Strategies: A Little ADL Observation: Bathing/Showering: N/O Safety: A Little Risk Efficiency: Somewhat Intervention: Yes Other Comments:: will consider LH sponge and adding grab bar Activities of Daily Living-Personal Hygiene and Grooming: Personal Hygiene and Grooming: No Difficulty Do You:: No Device/No Assistance Importance Of Learning New Strategies: Not At All Activities of Daily Living-Toilet Hygiene: Toilet Hygiene: No Difficulty Do You:: No Device/No Assistance Importance Of Learning New Strategies: Not At All Activities of Daily Living-Put On And Take Off Undergarments (Incl. Fasteners): Put On And Take Off Undergarments (Incl. Fasteners): No Difficulty Do You:: No Device/No Assistance Importance Of Learning New Strategies: Not At All Activities of Daily Living-Put On And Take Off Shirt/Dress/Coat (Incl. Fasteners): Put On And Take Off Shirt/Dress/Coat (Incl. Fasteners): No Difficulty Do You:: No Device/No Assistance Importance Of Learning New Strategies: Not At All Activities of Daily Living-Put On And Take Off Socks And Shoes: Put On And Take Off Socks And  Shoes: No Difficulty Do You:: No Device/No Assistance Importance Of Learning New Strategies: Not At All Activities of  Daily Living-Feed Self: Feed Self: No Difficulty Do You:: No Device/No Assistance Importance Of Learning New Strategies: Not At All Activities of Daily Living-Rest And Sleep: Rest and Sleep: No Difficulty Do You:: No Device/No Assistance Importance Of Learning New Strategies: Not At All Activities of Daily Living-Sexual Activity: Sexual  Activity: N/A  Instrumental Activities of Daily Living-Light Homemaking (Laundry, Straightening Up, Vacuuming):  Do Light Homemaking (Laundry, Straightening Up, Vacuuming): A Little Difficulty Do You:: Use Personal Assistance Importance Of Learning New Strategies: Not At All Other Comments:: family members help as needed Instrumental Activities of Daily Living-Making A Bed: Making a Bed: A Little Difficulty Do You:: No Device/No Assistance Importance Of Learning New Strategies: Not At All Other Comments:: uses energy conservation Instrumental Activities of Daily Living-Washing Dishes By Hand While Standing At The Sink: Washing Dishes By Hand While Standing At The Sink: A Little Difficulty Do You:: Use A Device Importance Of Learning New Strategies: Moderate Other Comments:: uses rollator to sit for breaks, most difficulty due to broken sink handles and not hot water at the sink IADL Observation: Washing Dishes By Hand While Standing At The Sink: Independent With Pain, Difficulty, Or Use Of Device Safety: Moderate/Extreme Risk Efficiency: Not At All Intervention: Yes Instrumental Activities of Daily Living-Grocery Shopping: Do Grocery Shopping: A Little Difficulty Do You:: Use Both A Device And Personal Assistance Importance Of Learning New Strategies: Not At All Instrumental Activities of Daily Living-Use Telephone: Use Telephone: No Difficulty Do You:: No Device/No Assistance Importance Of Learning New Strategies: Not At All Instrumental Activities of Daily Living-Financial Management: Financial Management: No Difficulty Do You:: No Device/No  Assistance Importance Of Learning New Strategies: Not At All Instrumental Activities of Daily Living-Medications: Take Medications: No Difficulty Do You:: No Device/No Assistance Importance Of Learning New Strategies: Not At All Instrumental Activities of Daily Living-Health Management And Maintenance: Health Management & Maintenance: No Difficulty Do You:: No Device/No Assistance  Importance Of Learning New Strategies: Not At All Instrumental Activities of Daily Living-Meal Preparation and Clean-Up: Meal Preparation and Clean-Up: A Lot of Difficulty Do You:: No Device/No Assistance Importance Of Learning New Strategies: Moderate IADL Observation: Meal Preparation And Clean Up: Independent With Pain, Difficulty, Or Use Of Device Safety: Moderate/Extreme Risk Efficiency: Not At All Intervention: Yes Other Comments:: uses rollator to sit for breaks, most difficulty due to broken sink handles and not hot water at the sink Instrumental Activities of Daily Living-Provide Care For Others/Pets: Care For Others/Pets: N/A Instrumental Activities of Daily Living-Take Part In Organized Social Activities: Take Part In Organized Social Activities: Unable To Do Do You:: Use Both A Device And Personal Assistance Importance Of Learning New Strategies: Not At All Other Comments:: due to cancer treatment is self limiting but can do with support when needed. Instrumental Activities of Daily Living-Leisure Participation: Leisure Participation: A Little Difficulty Do You:: Use Both A Device And Personal Assistance Importance Of Learning New Strategies: Not At All Instrumental Activities of Daily Living-Employment/Volunteer Activities: Employment/Volunteer Activities: N/A Instrumental Activities of Daily Living-Other Identifies:    Readiness To Change Score:  Readiness to Change Score: 8.67  Home Environment Assessment: Outside Home Entry:: 2 steps to porch with no hand rail. 1 more step into the  house. Has a covered front porch Entryway/Foyer:: small, opens to living room Kitchen:: sink is not working and no hot water. Report tripping hazard at thresholds Stairs:: none in the house Bathroom:: 1) hallway bathroom: has tub bench, broken handle at shower faucette, shower head only, 1 built in rail used a grab bar but is not a grab bar. Has portable commode chair over the toilet seat. Concern for transition between bathroom and hallway and there is a visible dip in the floor. 2) Bedroom bathroom: has shower stall with broken handles, no grab bars and curtain rod is fragile. Toilet is broken regarding flush, client lifts the lid to manually flush. Both toilets are lower. Has portable commode seat over the toilet Master Bedroom:: limited walking path from door to bathroom due to position of the bed and TV stand. Large queen bed high off the floor. Family already provided a large safe step up to the bed. Hallways:: carpet with tears, client reports previous stumble but no fall. Other Home Environment Concerns:: only has a lamp in bedroom for lighting  Durable Medical Equipment: Durable Medical Equipment: Cane, Elevated Commode Seat, Rolling Walker, Scientist, Research (life Sciences)  Patient Education: Education Provided: Yes Education Details: Check for Safety handout, copy of goals, copy of AG consent form and patient rights given and reviewed. Person(s) Educated: Patient Comprehension: Verbalized Understanding  Goals:  Goals Addressed             This Visit's Progress    AG OT Patient Stated       Improve access and safety in the kitchen     AG OT Patient Stated       Improve safety and mobility in both bathrooms     AG OT Patient Stated       Improve safety and mobility in and out of the house.        Post Clinical Reasoning: Client Action (Goal) One Interventions: Improve access and safety in the kitchen Client Action (Goal) Two Interventions: Improve safety and mobility in both  bathrooms Client Action (Goal) Three Interventions: Improve safety and mobility in and out of the house  Deland Lily, OTR/L 12/28/2023 8:31 AM Phone: [phone] Fax: [phone]

## 2023-12-29 ENCOUNTER — Other Ambulatory Visit: Payer: Self-pay

## 2023-12-30 ENCOUNTER — Ambulatory Visit
Admission: RE | Admit: 2023-12-30 | Discharge: 2023-12-30 | Disposition: A | Source: Ambulatory Visit | Attending: Radiation Oncology

## 2023-12-31 ENCOUNTER — Other Ambulatory Visit: Payer: Self-pay | Admitting: *Deleted

## 2023-12-31 ENCOUNTER — Telehealth: Payer: Self-pay | Admitting: Radiation Oncology

## 2023-12-31 NOTE — Telephone Encounter (Signed)
 12/16 patient left voicemail about her post-treatment call.  Email forward to nursing, so they are aware.

## 2023-12-31 NOTE — Patient Outreach (Signed)
 Aging Gracefully Program  12/31/2023  Donna [name] 22-Oct-1950 [id number]  Telephone call made to [name] to schedule initial AG RN home visit. No answer. HIPAA compliant voicemail message left requesting return call.  SABRA Pablo Hurst, MSN, RN, BSN [place]  [facility], [facility] RN Case Manager for Aging Gracefully Direct Dial: [phone]

## 2024-01-01 ENCOUNTER — Encounter: Payer: Self-pay | Admitting: Hematology and Oncology

## 2024-01-01 ENCOUNTER — Encounter: Payer: Self-pay | Admitting: *Deleted

## 2024-01-01 NOTE — Assessment & Plan Note (Signed)
 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%, HER2 3+ positive   Treatment plan: Neoadjuvant TCHP x 5 cycles (stopped early for toxicities) followed by HP maintenance versus Kadcyla  maintenance 09/04/2023: Left mastectomy: Residual IDC 2.2 cm with DCIS, margins negative for invasive cancer, DCIS: Present at anterior-inferior margin, 4/6 sentinel lymph nodes positive, ER 100%, PR 60%, Ki67 10%, HER2 1+; right mastectomy: 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 I discussed the margins as well as lymph node surgeries. We also discussed the final staging along with previously performed ER/PR and HER-2/neu testing.  Treatment plan: Recommend Kadcyla  maintenance Adjuvant radiation therapy Adjuvant antiestrogen therapy and neratinib  Tolerating treatment well.  Recommended energy conservation for fatigue.  Labs stable and reviewed with Jisella in detail.  Echo due next week.    RTC in 3 weeks for labs, f/u, and next treatment.

## 2024-01-03 ENCOUNTER — Ambulatory Visit (HOSPITAL_COMMUNITY)
Admission: RE | Admit: 2024-01-03 | Discharge: 2024-01-03 | Disposition: A | Discharge status: Home / Self Care | Source: Ambulatory Visit | Attending: Adult Health | Admitting: Adult Health

## 2024-01-03 DIAGNOSIS — I358 Other nonrheumatic aortic valve disorders: Secondary | ICD-10-CM | POA: Insufficient documentation

## 2024-01-03 DIAGNOSIS — C50811 Malignant neoplasm of overlapping sites of right female breast: Secondary | ICD-10-CM | POA: Diagnosis not present

## 2024-01-03 DIAGNOSIS — Z171 Estrogen receptor negative status [ER-]: Secondary | ICD-10-CM | POA: Insufficient documentation

## 2024-01-03 DIAGNOSIS — Z0189 Encounter for other specified special examinations: Secondary | ICD-10-CM

## 2024-01-03 LAB — ECHOCARDIOGRAM COMPLETE
AR max vel: 1.9 cm2
AV Area VTI: 1.93 cm2
AV Area mean vel: 1.79 cm2
AV Mean grad: 5 mmHg
AV Peak grad: 8.4 mmHg
Ao pk vel: 1.45 m/s
Area-P 1/2: 1.96 cm2
Est EF: 55
S' Lateral: 3.2 cm

## 2024-01-04 ENCOUNTER — Other Ambulatory Visit: Payer: Self-pay

## 2024-01-07 ENCOUNTER — Ambulatory Visit: Payer: Self-pay

## 2024-01-07 NOTE — Telephone Encounter (Signed)
 Spoke with the patient regarding her ECHO results. She verbalized understanding that the cardiology department will contact her to schedule the appointment. She was also informed of her next scheduled visit at the cancer center on 01/17/2024.

## 2024-01-07 NOTE — Telephone Encounter (Signed)
-----   Message from Morna Kendall, NP sent at 01/06/2024 12:54 PM EST ----- Please let patient know that there is a very early change on her echocardiogram and I am placing her referral to our heart specialist to have expertise in cancer treatment and its impact on the heart  for further evaluation.

## 2024-01-12 ENCOUNTER — Other Ambulatory Visit: Payer: Self-pay

## 2024-01-13 ENCOUNTER — Telehealth (HOSPITAL_COMMUNITY): Payer: Self-pay | Admitting: Vascular Surgery

## 2024-01-13 NOTE — Telephone Encounter (Signed)
 Lvm to [facility] new brst cancer appt w/ Donna [name] next ava

## 2024-01-14 ENCOUNTER — Encounter: Payer: Self-pay | Admitting: *Deleted

## 2024-01-14 ENCOUNTER — Other Ambulatory Visit: Admitting: *Deleted

## 2024-01-14 NOTE — Patient Outreach (Signed)
 Aging Gracefully Program  RN Visit  01/14/2024  Donna [name] 02-25-1950 [id number]  Visit:  RN Visit Number: 1- Initial Visit  RN TIME CALCULATION: Start TIme:  RN Start Time Calculation: 1400 End Time:  RN Stop Time Calculation: 1550 Total Minutes:  RN Time Calculation: 110  Readiness To Change Score:  Readiness to Change Score: 10  Universal RN Interventions: Calendar Distribution: Yes Exercise Review: No Medications: Yes Medication Changes: No Mood: Yes Pain: Yes PCP Advocacy/Support: No Fall Prevention: Yes Incontinence: Yes Clinician View Of Client Situation: Arrived for home visit. Donna [name] did not initially answer the door until writer called her on her cell phone. Donna [name] ambulating independently without assistive device. Home clutter throughout. Client View Of His/Her Situation: Donna [name] reports she has been tired and fatigue as of late. Continues to receive cancer treatment. Denies any pain.  Healthcare Provider Communication: Did Surveyor, [employer] Provider?: No Healthcare Provider Response According to RN: n/a According to Client, Did PCP Report Communication With An Aging Gracefully RN?: No Healthcare Provider Response According To Client: n/a  Clinician View of Client Situation: Clinician View Of Client Situation: Arrived for home visit. Donna [name] did not initially answer the door until writer called her on her cell phone. Donna [name] ambulating independently without assistive device. Home clutter throughout. Client's View of His/Her Situation: Client View Of His/Her Situation: Donna [name] reports she has been tired and fatigue as of late. Continues to receive cancer treatment. Denies any pain.  Medication Assessment: Do You Have Any Problems Paying For Medications?: Yes (sometimes for medication that was not covered by insurance) Where Does Client Store Medications?: Other: (in bedroom) Can Client Read Pill  Bottles?: Yes Does Client Use A Pillbox?: No Does Anyone Assist Client In Taking Medications?: No Do You Take Vitamin D?: Yes Does Client Have Any Questions Or Concerns About Medictions?: No Is Client Complaining Of Any Symptoms That Could Be Side Effects To Medications?: No Any Possible Changes In Medication Regimen?: No  OT Update: Pending [facility] contracts and assessments  Session Summary: Donna [name] is knowledgeable about her medications and uses. She is engaging and receptive.    Goals Addressed               This Visit's Progress     AG RN (pt-stated)        01/14/24  Assessment: Donna [name] denies pain. Reports feeling fatigue while undergoing cancer treatment. States she would like to be able to keep up with housework but has been unable to due to fatigue and weakness at times. Endorses smoking 10 cigarettes a day. States she desperately wants to stop smoking. States she tries to distract herself from smoking while playing games on her phone.  Interventions: Discussed trying new/different strategies for distractions by actively using hands. Encouraged frequent rest breaks. Encouraged Donna [name] to stay hydrated by drinking more water. Discussed fall precautions. Encouraged Donna [name] to contact her DSS case worker to inquire whether she has benefit for PCS thru Medicaid.  Encouraged Donna [name] to contact smoking cessation assistance line listed on goals sheet. Provided chronic conditions booklet and writer's contact information.   Plan: Scheduled next home visit for January 28th at 2pm.   CLIENT/RN ACTION PLAN - STOP SMOKING  Registered Nurse:  Pablo Hurst  Date: 01/14/24  Client Name: Donna [name] Client ID:    Target Area:  STOP SMOKING    What does client want to be able to do? Control the urge to  smoke    Years smoking: over 50 years   Packs per day: 10 cigarettes a day; no packs   STRATEGIES Ideas Strategies  Why Quit? Reviewed 01-24-2024 Save  money Feel better You and your family will be more healthy. Set a good example for your children and grandchildren.   Risks to You Reviewed 2024-01-24 Death Cancer:  lung, colon, bladder, mouth, stomach and pancreas Arthritis Asthma, couch and Chronic Obstructive Pulmonary Disease Heart Disease including high blood pressure  Risks to Your Family Reviewed 24-Jan-2024 Cancer Asthma, allergies and pneumonia Heart Disease Ear Infections   Be Patient Yourself Reviewed 01-24-24 Many people try to quit several times before they are successful. If you slip and do smoke, don't get mad at yourself, try to stop again the next day.    How to Quit Smoking Reviewed 24-Jan-2024 Many people who quit use a lot of different tools at the same time: Support group Medications Nicotine  patches or Nicotine  gum Support from family and friends    Set a Quit Date Reviewed 01-24-24 A quit date gives you a goal to reach. It is a promise to yourself and your lived ones that you will quit. If you decide today that you want to quit, stet your quit date 2-3 weeks from now.     Medications Reviewed 01/24/24 Medications can decrease Nicotine  cravings. Zyban, Wellbutrin, or Chantix are medicines people take to help quit smoking.  You need a prescription from your Primary Care Provider.   Nicotine  Cravings Reviewed 01/24/24 Wait for craving to go away - be patient. Chew gum. Wear Nicotine  patches. Take Medication.   Know your triggers. Reviewed 24-Jan-2024 Common triggers At a party Boredom Stress Drinking alcohol Hanging out with friends who smoke   Dealing with Triggers  Reviewed 2024-01-24 Distract yourself, go for a walk or exercise. Avoid friends whom smoke. Pray Listen to music. Take a bath. Wait for the urge to pass. Take slow deep breaths until the urge for cigarette goes away.   Get Support Reviewed 01/24/2024 Support groups Family and friends Smoking quit line, [phone] [facility]) CDC help quit smoking ([phone] 670-2205 [place] offers QuitSmart smoking cessation classes call [phone] to register   More information Reviewed Jan 24, 2024 Pathways to Freedom by the CDC  mysteryraffle.it quit/pathways/ Clearing the Air by the [employer], NIH http://www.smokefree.gove/pubs/Clearing_the_Air_508.pdf http://www.smokefree/gov/   Other    PRACTICE It is important to practice the strategies so we can determine if they will be effective in helping to reach the goal.    Follow these specific recommendations:        If strategy does not work the first time, try it again.     We may make some changes over the next few sessions.      Pablo Hurst, MSN, RN, BSN   [facility], [facility] Commun[facility] RN Case Manager for Aging Gracefully Direct Dial: [phone]

## 2024-01-14 NOTE — Patient Instructions (Signed)
 Visit Information  Thank you for taking time to visit with me today. Please don't hesitate to contact me if I can be of assistance to you before our next scheduled home appointment.  Following are the goals we discussed today:   Goals Addressed               This Visit's Progress     AG RN (pt-stated)        02/05/24  Assessment: Ms. Lalli denies pain. Reports feeling fatigue while undergoing cancer treatment. States she would like to be able to keep up with housework but has been unable to due to fatigue and weakness at times. Endorses smoking 10 cigarettes a day. States she desperately wants to stop smoking. States she tries to distract herself from smoking while playing games on her phone.  Interventions: Discussed trying new/different strategies for distractions by actively using hands. Encouraged frequent rest breaks. Encouraged Ms. Serviss to stay hydrated by drinking more water. Discussed fall precautions. Encouraged Ms. Honse to contact her DSS case worker to inquire whether she has benefit for PCS thru Medicaid.  Encouraged Ms. Medero to contact smoking cessation assistance line listed on goals sheet. Provided chronic conditions booklet and writer's contact information.   Plan: Scheduled next home visit for January 28th at 2pm.   CLIENT/RN ACTION PLAN - STOP SMOKING  Registered Nurse:  Pablo Hurst  Date: 2024-02-05  Client Name: Donna [name] Client ID:    Target Area:  STOP SMOKING    What does client want to be able to do? Control the urge to smoke    Years smoking: over 50 years   Packs per day: 10 cigarettes a day; no packs   STRATEGIES Ideas Strategies  Why Quit? Reviewed 02-05-24 Save money Feel better You and your family will be more healthy. Set a good example for your children and grandchildren.   Risks to You Reviewed 02/05/24 Death Cancer:  lung, colon, bladder, mouth, stomach and pancreas Arthritis Asthma, couch and Chronic  Obstructive Pulmonary Disease Heart Disease including high blood pressure  Risks to Your Family Reviewed Feb 05, 2024 Cancer Asthma, allergies and pneumonia Heart Disease Ear Infections   Be Patient Yourself Reviewed Feb 05, 2024 Many people try to quit several times before they are successful. If you slip and do smoke, don't get mad at yourself, try to stop again the next day.    How to Quit Smoking Reviewed 02-05-2024 Many people who quit use a lot of different tools at the same time: Support group Medications Nicotine  patches or Nicotine  gum Support from family and friends    Set a Quit Date Reviewed 02/05/24 A quit date gives you a goal to reach. It is a promise to yourself and your lived ones that you will quit. If you decide today that you want to quit, stet your quit date 2-3 weeks from now.     Medications Reviewed 02-05-2024 Medications can decrease Nicotine  cravings. Zyban, Wellbutrin, or Chantix are medicines people take to help quit smoking.  You need a prescription from your Primary Care Provider.   Nicotine  Cravings Reviewed 2024/02/05 Wait for craving to go away - be patient. Chew gum. Wear Nicotine  patches. Take Medication.   Know your triggers. Reviewed 02-05-2024 Common triggers At a party Boredom Stress Drinking alcohol Hanging out with friends who smoke   Dealing with Triggers  Reviewed 02-05-24 Distract yourself, go for a walk or exercise. Avoid friends whom smoke. Pray Listen to music. Take a bath. Wait for the urge  to pass. Take slow deep breaths until the urge for cigarette goes away.   Get Support Reviewed 01/14/24 Support groups Family and friends Smoking quit line, 336-611-9194 [facility]) CDC help quit smoking ([phone] [place] offers QuitSmart smoking cessation classes call [phone] to register   More information Reviewed 01/14/24 Pathways to Freedom by the CDC  mysteryraffle.it  quit/pathways/ Clearing the Air by the [employer], NIH http://www.smokefree.gove/pubs/Clearing_the_Air_508.pdf http://www.smokefree/gov/   Other    PRACTICE It is important to practice the strategies so we can determine if they will be effective in helping to reach the goal.    Follow these specific recommendations:        If strategy does not work the first time, try it again.     We may make some changes over the next few sessions.      Pablo Hurst, MSN, RN, BSN [place]  [facility], [facility] Commun[facility] RN Case Manager for Aging Gracefully Direct Dial: [phone]                                      Our next appointment is on February 12, 2024 at 2pm.  If you are experiencing a Mental Health or Behavioral Health Crisis or need someone to talk to, please call the Suicide and Crisis Lifeline: 988 call the [place]  National Suicide Prevention Lifeline: 707 560 3189 or TTY: 757-786-0663 TTY 870-536-6352) to talk to a trained counselor call 1-800-273-TALK (toll free, 24 hour hotline) go to [facility] [address], [place] [phone]) call 911   The patient verbalized understanding of instructions, educational materials, and care plan provided today and agreed to receive a mailed copy of patient instructions, educational materials, and care plan.   Pablo Hurst, MSN, RN, BSN [place]  [facility], Healthy Communities RN Case Manager for Aging Gracefully Direct Dial: [phone]

## 2024-01-15 ENCOUNTER — Other Ambulatory Visit: Payer: Self-pay

## 2024-01-17 ENCOUNTER — Inpatient Hospital Stay

## 2024-01-17 ENCOUNTER — Inpatient Hospital Stay: Attending: Hematology and Oncology

## 2024-01-17 ENCOUNTER — Encounter: Payer: Self-pay | Admitting: *Deleted

## 2024-01-17 VITALS — BP 156/67 | HR 73 | Temp 98.0°F | Resp 18 | Wt 152.2 lb

## 2024-01-17 DIAGNOSIS — Z170 Estrogen receptor positive status [ER+]: Secondary | ICD-10-CM

## 2024-01-17 DIAGNOSIS — Z171 Estrogen receptor negative status [ER-]: Secondary | ICD-10-CM | POA: Insufficient documentation

## 2024-01-17 DIAGNOSIS — Z1722 Progesterone receptor negative status: Secondary | ICD-10-CM | POA: Insufficient documentation

## 2024-01-17 DIAGNOSIS — Z9013 Acquired absence of bilateral breasts and nipples: Secondary | ICD-10-CM | POA: Diagnosis not present

## 2024-01-17 DIAGNOSIS — Z1731 Human epidermal growth factor receptor 2 positive status: Secondary | ICD-10-CM | POA: Insufficient documentation

## 2024-01-17 DIAGNOSIS — Z803 Family history of malignant neoplasm of breast: Secondary | ICD-10-CM | POA: Insufficient documentation

## 2024-01-17 DIAGNOSIS — Z800 Family history of malignant neoplasm of digestive organs: Secondary | ICD-10-CM | POA: Insufficient documentation

## 2024-01-17 DIAGNOSIS — Z5112 Encounter for antineoplastic immunotherapy: Secondary | ICD-10-CM | POA: Insufficient documentation

## 2024-01-17 DIAGNOSIS — Z801 Family history of malignant neoplasm of trachea, bronchus and lung: Secondary | ICD-10-CM | POA: Insufficient documentation

## 2024-01-17 DIAGNOSIS — Z8041 Family history of malignant neoplasm of ovary: Secondary | ICD-10-CM | POA: Insufficient documentation

## 2024-01-17 DIAGNOSIS — C50811 Malignant neoplasm of overlapping sites of right female breast: Secondary | ICD-10-CM | POA: Diagnosis present

## 2024-01-17 LAB — CMP (CANCER CENTER ONLY)
ALT: 14 U/L (ref 0–44)
AST: 26 U/L (ref 15–41)
Albumin: 4 g/dL (ref 3.5–5.0)
Alkaline Phosphatase: 97 U/L (ref 38–126)
Anion gap: 10 (ref 5–15)
BUN: 13 mg/dL (ref 8–23)
CO2: 24 mmol/L (ref 22–32)
Calcium: 9.7 mg/dL (ref 8.9–10.3)
Chloride: 108 mmol/L (ref 98–111)
Creatinine: 0.99 mg/dL (ref 0.44–1.00)
GFR, Estimated: >60 mL/min
Glucose, Bld: 90 mg/dL (ref 70–99)
Potassium: 3.9 mmol/L (ref 3.5–5.1)
Sodium: 142 mmol/L (ref 135–145)
Total Bilirubin: 0.3 mg/dL (ref 0.0–1.2)
Total Protein: 6.7 g/dL (ref 6.5–8.1)

## 2024-01-17 LAB — CBC WITH DIFFERENTIAL (CANCER CENTER ONLY)
Abs Immature Granulocytes: 0.02 K/uL (ref 0.00–0.07)
Basophils Absolute: 0 K/uL (ref 0.0–0.1)
Basophils Relative: 0 %
Eosinophils Absolute: 0.1 K/uL (ref 0.0–0.5)
Eosinophils Relative: 1 %
HCT: 39 % (ref 36.0–46.0)
Hemoglobin: 12.9 g/dL (ref 12.0–15.0)
Immature Granulocytes: 0 %
Lymphocytes Relative: 23 %
Lymphs Abs: 1.7 K/uL (ref 0.7–4.0)
MCH: 30.9 pg (ref 26.0–34.0)
MCHC: 33.1 g/dL (ref 30.0–36.0)
MCV: 93.3 fL (ref 80.0–100.0)
Monocytes Absolute: 0.5 K/uL (ref 0.1–1.0)
Monocytes Relative: 6 %
Neutro Abs: 5.4 K/uL (ref 1.7–7.7)
Neutrophils Relative %: 70 %
Platelet Count: 243 K/uL (ref 150–400)
RBC: 4.18 MIL/uL (ref 3.87–5.11)
RDW: 14.4 % (ref 11.5–15.5)
WBC Count: 7.7 K/uL (ref 4.0–10.5)
nRBC: 0 % (ref 0.0–0.2)

## 2024-01-17 MED ORDER — SODIUM CHLORIDE 0.9 % IV SOLN: INTRAVENOUS | Status: DC

## 2024-01-17 MED ORDER — DIPHENHYDRAMINE HCL 25 MG PO CAPS
25.0000 mg | ORAL_CAPSULE | Freq: Once | ORAL | Status: AC
  Administered 2024-01-17: 25 mg via ORAL
  Filled 2024-01-17: qty 1

## 2024-01-17 MED ORDER — SODIUM CHLORIDE 0.9 % IV SOLN
3.0000 mg/kg | Freq: Once | INTRAVENOUS | Status: AC
  Administered 2024-01-17: 200 mg via INTRAVENOUS
  Filled 2024-01-17: qty 10

## 2024-01-17 MED ORDER — ACETAMINOPHEN 325 MG PO TABS
650.0000 mg | ORAL_TABLET | Freq: Once | ORAL | Status: AC
  Administered 2024-01-17: 650 mg via ORAL
  Filled 2024-01-17: qty 2

## 2024-01-17 MED ORDER — PROCHLORPERAZINE MALEATE 10 MG PO TABS
10.0000 mg | ORAL_TABLET | Freq: Once | ORAL | Status: AC
  Administered 2024-01-17: 10 mg via ORAL
  Filled 2024-01-17: qty 1

## 2024-01-17 NOTE — Patient Instructions (Signed)
 [facility] - A DEPT OF [facility]  Discharge Instructions: Thank you for choosing [facility] to provide your oncology and hematology care.   If you have a lab appointment with the Cancer Center, please go directly to the Cancer Center and check in at the registration area.   Wear comfortable clothing and clothing appropriate for easy access to any Portacath or PICC line.   We strive to give you quality time with your provider. You may need to reschedule your appointment if you arrive late (15 or more minutes).  Arriving late affects you and other patients whose appointments are after yours.  Also, if you miss three or more appointments without notifying the office, you may be dismissed from the clinic at the provider's discretion.      For prescription refill requests, have your pharmacy contact our office and allow 72 hours for refills to be completed.    Today you received the following chemotherapy and/or immunotherapy agents: Kadcyla .       To help prevent nausea and vomiting after your treatment, we encourage you to take your nausea medication as directed.  BELOW ARE SYMPTOMS THAT SHOULD BE REPORTED IMMEDIATELY: *FEVER GREATER THAN 100.4 F (38 C) OR HIGHER *CHILLS OR SWEATING *NAUSEA AND VOMITING THAT IS NOT CONTROLLED WITH YOUR NAUSEA MEDICATION *UNUSUAL SHORTNESS OF BREATH *UNUSUAL BRUISING OR BLEEDING *URINARY PROBLEMS (pain or burning when urinating, or frequent urination) *BOWEL PROBLEMS (unusual diarrhea, constipation, pain near the anus) TENDERNESS IN MOUTH AND THROAT WITH OR WITHOUT PRESENCE OF ULCERS (sore throat, sores in mouth, or a toothache) UNUSUAL RASH, SWELLING OR PAIN  UNUSUAL VAGINAL DISCHARGE OR ITCHING   Items with * indicate a potential emergency and should be followed up as soon as possible or go to the Emergency Department if any problems should occur.  Please show the CHEMOTHERAPY ALERT CARD or IMMUNOTHERAPY  ALERT CARD at check-in to the Emergency Department and triage nurse.  Should you have questions after your visit or need to cancel or reschedule your appointment, please contact [facility] - A DEPT OF [facility]  Dept: [phone]  and follow the prompts.  Office hours are 8:00 a.m. to 4:30 p.m. Monday - Friday. Please note that voicemails left after 4:00 p.m. may not be returned until the following business day.  We are closed weekends and major holidays. You have access to a nurse at all times for urgent questions. Please call the main number to the clinic Dept: [phone] and follow the prompts.   For any non-urgent questions, you may also contact your provider using MyChart. We now offer e-Visits for anyone 98 and older to request care online for non-urgent symptoms. For details visit [email or web address].   Also download the MyChart app! Go to the app store, search MyChart, open the app, select [place], and log in with your MyChart username and password.

## 2024-01-17 NOTE — Progress Notes (Addendum)
 Cardiology referral placed due to recent ECHO on 12/19. Okay to proceed with Kadcyla  treatment today per Morna, NP and cardiologist.  Harlene Nasuti, PharmD Oncology Infusion Pharmacist 01/17/2024 3:03 PM

## 2024-01-29 ENCOUNTER — Telehealth (HOSPITAL_COMMUNITY): Payer: Self-pay | Admitting: Cardiology

## 2024-01-29 NOTE — Telephone Encounter (Signed)
 Called to confirm/remind patient of their appointment at the [facility] on 01/29/24.   Appointment:   [] Confirmed  [x] Left mess   [] No answer/No voice mail  [] VM Full/unable to leave message  [] Phone not in service  Patient reminded to bring all medications and/or complete list.  Confirmed patient has transportation. Gave directions, instructed to utilize valet parking.

## 2024-01-30 ENCOUNTER — Encounter (HOSPITAL_COMMUNITY): Payer: Self-pay | Admitting: Cardiology

## 2024-01-30 ENCOUNTER — Ambulatory Visit (HOSPITAL_COMMUNITY)
Admission: RE | Admit: 2024-01-30 | Discharge: 2024-01-30 | Disposition: A | Discharge status: Home / Self Care | Source: Ambulatory Visit | Attending: Cardiology | Admitting: Cardiology

## 2024-01-30 VITALS — BP 120/70 | HR 78 | Wt 151.2 lb

## 2024-01-30 DIAGNOSIS — Z1731 Human epidermal growth factor receptor 2 positive status: Secondary | ICD-10-CM | POA: Insufficient documentation

## 2024-01-30 DIAGNOSIS — C50811 Malignant neoplasm of overlapping sites of right female breast: Secondary | ICD-10-CM | POA: Diagnosis not present

## 2024-01-30 DIAGNOSIS — Z79620 Long term (current) use of immunosuppressive biologic: Secondary | ICD-10-CM | POA: Insufficient documentation

## 2024-01-30 DIAGNOSIS — Z170 Estrogen receptor positive status [ER+]: Secondary | ICD-10-CM | POA: Diagnosis not present

## 2024-01-30 DIAGNOSIS — Z9013 Acquired absence of bilateral breasts and nipples: Secondary | ICD-10-CM | POA: Insufficient documentation

## 2024-01-30 DIAGNOSIS — C50412 Malignant neoplasm of upper-outer quadrant of left female breast: Secondary | ICD-10-CM | POA: Insufficient documentation

## 2024-01-30 DIAGNOSIS — Z9221 Personal history of antineoplastic chemotherapy: Secondary | ICD-10-CM | POA: Diagnosis not present

## 2024-01-30 DIAGNOSIS — Z171 Estrogen receptor negative status [ER-]: Secondary | ICD-10-CM | POA: Diagnosis not present

## 2024-01-30 DIAGNOSIS — Z923 Personal history of irradiation: Secondary | ICD-10-CM | POA: Diagnosis not present

## 2024-01-30 DIAGNOSIS — Z5181 Encounter for therapeutic drug level monitoring: Secondary | ICD-10-CM | POA: Diagnosis present

## 2024-01-30 DIAGNOSIS — I10 Essential (primary) hypertension: Secondary | ICD-10-CM | POA: Diagnosis not present

## 2024-01-30 LAB — TROPONIN T, HIGH SENSITIVITY: Troponin T High Sensitivity: <15 ng/L (ref 0–19)

## 2024-01-30 LAB — PRO BRAIN NATRIURETIC PEPTIDE: Pro Brain Natriuretic Peptide: 70.3 pg/mL

## 2024-01-30 MED ORDER — CARVEDILOL 3.125 MG PO TABS: 3.1250 mg | ORAL_TABLET | Freq: Two times a day (BID) | ORAL | 3 refills | Status: AC

## 2024-01-30 NOTE — Patient Instructions (Signed)
 STOP amlodipine   START Carvedilol  3.125 mg Twice daily  Labs done today, your results will be available in MyChart, we will contact you for abnormal readings.  Your physician has requested that you have an echocardiogram. Echocardiography is a painless test that uses sound waves to create images of your heart. It provides your doctor with information about the size and shape of your heart and how well your hearts chambers and valves are working. This procedure takes approximately one hour. There are no restrictions for this procedure. Please do NOT wear cologne, perfume, aftershave, or lotions (deodorant is allowed). Please arrive 15 minutes prior to your appointment time.  Please note: We ask at that you not bring children with you during ultrasound (echo/ vascular) testing. Due to room size and safety concerns, children are not allowed in the ultrasound rooms during exams. Our front office staff cannot provide observation of children in our lobby area while testing is being conducted. An adult accompanying a patient to their appointment will only be allowed in the ultrasound room at the discretion of the ultrasound technician under special circumstances. We apologize for any inconvenience.  Please follow up with our heart failure pharmacist in 1 month   Your physician recommends that you schedule a follow-up appointment in: 2 months.  If you have any questions or concerns before your next appointment please send us  a message through [facility] or call our office at [phone].    TO LEAVE A MESSAGE FOR THE NURSE SELECT OPTION 2, PLEASE LEAVE A MESSAGE INCLUDING: YOUR NAME DATE OF BIRTH CALL BACK NUMBER REASON FOR CALL**this is important as we prioritize the call backs  YOU WILL RECEIVE A CALL BACK THE SAME DAY AS LONG AS YOU CALL BEFORE 4:00 PM  At the [facility], you and your health needs are our priority. As part of our continuing mission to provide you with  exceptional heart care, we have created designated Provider Care Teams. These Care Teams include your primary Cardiologist (physician) and Advanced Practice Providers (APPs- Physician Assistants and Nurse Practitioners) who all work together to provide you with the care you need, when you need it.   You may see any of the following providers on your designated Care Team at your next follow up: Dr Toribio Fuel Dr Ezra Shuck Dr. Morene Brownie Greig Mosses, NP Caffie Shed, [place] [facility] [place], [place] Beckey Coe, NP Jordan Lee, NP Ellouise Class, NP Tinnie Redman, PharmD Jaun Bash, PharmD   Please be sure to bring in all your medications bottles to every appointment.    Thank you for choosing [facility]

## 2024-01-31 ENCOUNTER — Ambulatory Visit (HOSPITAL_COMMUNITY): Payer: Self-pay | Admitting: Cardiology

## 2024-01-31 ENCOUNTER — Other Ambulatory Visit: Payer: Self-pay

## 2024-01-31 NOTE — Progress Notes (Signed)
 "  Cardio-Oncology Clinic Consult Note   Referring Physician: Morna [name] Primary Care: Primary Cardiologist:  HPI:  Donna [name] is a 74 y.o. female with past medical history of breast cancer who has been referred by Dr. Odean to establish in the cardio-oncology clinic for monitoring of cardio-toxicity while undergoing chemotherapy.        Oncology History  Malignant neoplasm of overlapping sites of right breast in female, estrogen receptor negative (HCC)  01/01/2023 Cancer Staging   Staging form: Breast, AJCC 8th Edition - Clinical stage from 01/01/2023: Stage IIA (cT2, cN0, cM0, G3, ER-, PR-, HER2+) - Signed by Donna Potts, MD on 01/02/2023 Stage prefix: Initial diagnosis Method of lymph node assessment: Clinical Histologic grading system: 3 grade system   01/25/2023 - 10/25/2023 Chemotherapy   Patient is on Treatment Plan : BREAST  Docetaxel  + Carboplatin  + Trastuzumab  + Pertuzumab   (TCHP) q21d       Genetic Testing   [facility] CancerNext+RNA was Negative. Report date is 01/16/2023.   The [facility] CancerNext+RNAinsight Panel includes sequencing, rearrangement analysis, and RNA analysis for the following 39 genes: APC, ATM, BAP1, BARD1, BMPR1A, BRCA1, BRCA2, BRIP1, CDH1, CDKN2A, CHEK2, FH, FLCN, MET, MLH1, MSH2, MSH6, MUTYH, NF1, NTHL1, PALB2, PMS2, PTEN, RAD51C, RAD51D, SMAD4, STK11, TP53, TSC1, TSC2, and VHL (sequencing and deletion/duplication); AXIN2, HOXB13, MBD4, MSH3, POLD1 and POLE (sequencing only); EPCAM and GREM1 (deletion/duplication only).    08/16/2023 Surgery   Bilateral mastectomy:  LEFT: 2.2 cm IDC, grade 2, margins negative, 3/6 SLN + macrometastases, 1 SLN micrometastases, ypT2, ypN2a, prog panel repeat ER 100%, PR 60%, Ki-67 10%, HER2 negative (1+).  RIGHT: ILC 1.8cm, margins negative, 4 SLN negative for cancer, prog panel repeat ER 0% negative, PR 0% negative, Ki-67 10%, HER2 (2+), FISH HER2 positive.     09/19/2023 Cancer Staging   Staging form: Breast, AJCC  8th Edition - Pathologic stage from 09/19/2023: ypT1c, ypN0(sn), cM0, G2, ER-, PR-, HER2+ - Signed by Donna Donald Stagger, PA-C on 09/19/2023 Stage prefix: Post-therapy Response to neoadjuvant therapy: Partial response Method of lymph node assessment: Sentinel lymph node biopsy Histologic grading system: 3 grade system   10/16/2023 - 12/03/2023 Radiation Therapy   Adjuvant radiation   11/15/2023 -  Chemotherapy   Patient is on Treatment Plan : BREAST ADO-Trastuzumab Emtansine  (Kadcyla ) q21d     Malignant neoplasm of upper-outer quadrant of left breast in female, estrogen receptor positive (HCC)  01/01/2023 Cancer Staging   Staging form: Breast, AJCC 8th Edition - Clinical: Stage IIB (cT2, cN1(f), cM0, G3, ER+, PR+, HER2-) - Signed by Donna Donald Stagger, PA-C on 01/01/2023 Method of lymph node assessment: Core biopsy Histologic grading system: 3 grade system   01/25/2023 - 10/25/2023 Chemotherapy   Patient is on Treatment Plan : BREAST  Docetaxel  + Carboplatin  + Trastuzumab  + Pertuzumab   (TCHP) q21d       Genetic Testing   [facility] CancerNext+RNA was Negative. Report date is 01/16/2023.   The [facility] CancerNext+RNAinsight Panel includes sequencing, rearrangement analysis, and RNA analysis for the following 39 genes: APC, ATM, BAP1, BARD1, BMPR1A, BRCA1, BRCA2, BRIP1, CDH1, CDKN2A, CHEK2, FH, FLCN, MET, MLH1, MSH2, MSH6, MUTYH, NF1, NTHL1, PALB2, PMS2, PTEN, RAD51C, RAD51D, SMAD4, STK11, TP53, TSC1, TSC2, and VHL (sequencing and deletion/duplication); AXIN2, HOXB13, MBD4, MSH3, POLD1 and POLE (sequencing only); EPCAM and GREM1 (deletion/duplication only).    08/16/2023 Surgery   Bilateral mastectomy:  LEFT: 2.2 cm IDC, grade 2, margins negative, 3/6 SLN + macrometastases, 1 SLN micrometastases, ypT2, ypN2a, prog panel repeat ER  100%, PR 60%, Ki-67 10%, HER2 negative (1+).  RIGHT: ILC 1.8cm, margins negative, 4 SLN negative for cancer, prog panel repeat ER 0% negative, PR 0% negative, Ki-67 10%,  HER2 (2+), FISH HER2 positive.     09/19/2023 Cancer Staging   Staging form: Breast, AJCC 8th Edition - Pathologic stage from 09/19/2023: ypT2, ypN1b, cM0, G2, ER+, PR+, HER2- - Signed by Donna Donald Stagger, PA-C on 09/19/2023 Stage prefix: Post-therapy Response to neoadjuvant therapy: Partial response Method of lymph node assessment: Other Multigene prognostic tests performed: None Histologic grading system: 3 grade system   10/16/2023 - 12/03/2023 Radiation Therapy   Adjuvant radiation   11/15/2023 -  Chemotherapy   Patient is on Treatment Plan : BREAST ADO-Trastuzumab Emtansine  (Kadcyla ) q21d           Patient referred to cardio oncology clinic after echocardiogram showed a drop in strain from previous.  Patient specifically denies any heart failure symptoms including shortness of breath, orthopnea, PND, lower extremity swelling.  She has been maintained on her Kadcyla  without any adverse issues.  Has no prior history of cardiac disease besides a history of hypertension.  No anthracycline exposure   Medical history and current medications were reviewed in Epic as part of clinic visit.   PHYSICAL EXAM: Vitals:   01/30/24 1437  BP: 120/70  Pulse: 78  SpO2: 92%   GENERAL: NAD, well appearing PULM:  Normal work of breathing, CTAB CARDIAC:  JVP: flat         Normal rate with regular rhythm. No murmurs, rubs or gallops.  No edema. Warm and well perfused extremities. ABDOMEN: Soft, non-tender, non-distended. NEUROLOGIC: Patient is oriented x3 with no focal or lateralizing neurologic deficits.     ASSESSMENT & PLAN:  Cardiooncology: Patient on Kadcyla , on HER2 therapy since January. Mild drop in strain on most recent echocardiogram, tracking reasonable. Asymptomatic, would recommend continuing therapy at this time. - Obtain baseline cardiac biomarkers - Stop amlodipine  - Start carvedilol  3.125mg  BID for cardioprotection - Continue nifedipine  30mg  daily - Continue losartan   100mg  daily - Pharmacy follow up for further titration, potentially off nifedipine  to entresto at that time - Repeat echo at 3 month mark - Ok to continue therapy  Hypertension: - Adjustments as above, continue current therapy  Explained incidence of Kadcyla  cardiotoxicity and role of Cardio-oncology clinic at length. Echo images reviewed personally. All parameters stable. Reviewed signs and symptoms of HF to look for. Ok to continue chemotherapy. Follow-up with echo in 2 months.   Morene Brownie, MD Advanced Heart Failure Mechanical Circulatory Support Cardio-Oncology 01/31/24  "

## 2024-02-07 ENCOUNTER — Inpatient Hospital Stay

## 2024-02-07 ENCOUNTER — Encounter: Admitting: Rehabilitation

## 2024-02-07 ENCOUNTER — Encounter: Payer: Self-pay | Admitting: Adult Health

## 2024-02-07 ENCOUNTER — Inpatient Hospital Stay: Admitting: Adult Health

## 2024-02-07 VITALS — BP 158/75 | HR 87 | Temp 97.3°F | Resp 16 | Wt 153.7 lb

## 2024-02-07 VITALS — BP 123/60 | HR 94 | Resp 16

## 2024-02-07 DIAGNOSIS — M62838 Other muscle spasm: Secondary | ICD-10-CM | POA: Diagnosis not present

## 2024-02-07 DIAGNOSIS — C50412 Malignant neoplasm of upper-outer quadrant of left female breast: Secondary | ICD-10-CM

## 2024-02-07 DIAGNOSIS — Z170 Estrogen receptor positive status [ER+]: Secondary | ICD-10-CM

## 2024-02-07 DIAGNOSIS — C50811 Malignant neoplasm of overlapping sites of right female breast: Secondary | ICD-10-CM

## 2024-02-07 DIAGNOSIS — Z171 Estrogen receptor negative status [ER-]: Secondary | ICD-10-CM | POA: Diagnosis not present

## 2024-02-07 DIAGNOSIS — Z5112 Encounter for antineoplastic immunotherapy: Secondary | ICD-10-CM | POA: Diagnosis not present

## 2024-02-07 LAB — CBC WITH DIFFERENTIAL (CANCER CENTER ONLY)
Abs Immature Granulocytes: 0.02 K/uL (ref 0.00–0.07)
Basophils Absolute: 0 K/uL (ref 0.0–0.1)
Basophils Relative: 1 %
Eosinophils Absolute: 0.1 K/uL (ref 0.0–0.5)
Eosinophils Relative: 1 %
HCT: 38.3 % (ref 36.0–46.0)
Hemoglobin: 12.7 g/dL (ref 12.0–15.0)
Immature Granulocytes: 0 %
Lymphocytes Relative: 27 %
Lymphs Abs: 2.3 K/uL (ref 0.7–4.0)
MCH: 30.4 pg (ref 26.0–34.0)
MCHC: 33.2 g/dL (ref 30.0–36.0)
MCV: 91.6 fL (ref 80.0–100.0)
Monocytes Absolute: 0.5 K/uL (ref 0.1–1.0)
Monocytes Relative: 6 %
Neutro Abs: 5.4 K/uL (ref 1.7–7.7)
Neutrophils Relative %: 65 %
Platelet Count: 221 K/uL (ref 150–400)
RBC: 4.18 MIL/uL (ref 3.87–5.11)
RDW: 14.4 % (ref 11.5–15.5)
WBC Count: 8.4 K/uL (ref 4.0–10.5)
nRBC: 0 % (ref 0.0–0.2)

## 2024-02-07 LAB — CMP (CANCER CENTER ONLY)
ALT: 18 U/L (ref 0–44)
AST: 31 U/L (ref 15–41)
Albumin: 4 g/dL (ref 3.5–5.0)
Alkaline Phosphatase: 95 U/L (ref 38–126)
Anion gap: 10 (ref 5–15)
BUN: 8 mg/dL (ref 8–23)
CO2: 23 mmol/L (ref 22–32)
Calcium: 9.8 mg/dL (ref 8.9–10.3)
Chloride: 107 mmol/L (ref 98–111)
Creatinine: 0.81 mg/dL (ref 0.44–1.00)
GFR, Estimated: >60 mL/min
Glucose, Bld: 101 mg/dL — ABNORMAL HIGH (ref 70–99)
Potassium: 3.8 mmol/L (ref 3.5–5.1)
Sodium: 140 mmol/L (ref 135–145)
Total Bilirubin: 0.4 mg/dL (ref 0.0–1.2)
Total Protein: 6.9 g/dL (ref 6.5–8.1)

## 2024-02-07 MED ORDER — CYCLOBENZAPRINE HCL 5 MG PO TABS: 5.0000 mg | ORAL_TABLET | Freq: Two times a day (BID) | ORAL | 0 refills | Status: AC | PRN

## 2024-02-07 MED ORDER — DIPHENHYDRAMINE HCL 25 MG PO CAPS
25.0000 mg | ORAL_CAPSULE | Freq: Once | ORAL | Status: AC
  Administered 2024-02-07: 25 mg via ORAL
  Filled 2024-02-07: qty 1

## 2024-02-07 MED ORDER — SODIUM CHLORIDE 0.9 % IV SOLN: INTRAVENOUS | Status: DC

## 2024-02-07 MED ORDER — ACETAMINOPHEN 325 MG PO TABS
650.0000 mg | ORAL_TABLET | Freq: Once | ORAL | Status: AC
  Administered 2024-02-07: 650 mg via ORAL
  Filled 2024-02-07: qty 2

## 2024-02-07 MED ORDER — SODIUM CHLORIDE 0.9 % IV SOLN
3.0000 mg/kg | Freq: Once | INTRAVENOUS | Status: AC
  Administered 2024-02-07: 200 mg via INTRAVENOUS
  Filled 2024-02-07: qty 10

## 2024-02-07 MED ORDER — PROCHLORPERAZINE MALEATE 10 MG PO TABS
10.0000 mg | ORAL_TABLET | Freq: Once | ORAL | Status: AC
  Administered 2024-02-07: 10 mg via ORAL
  Filled 2024-02-07: qty 1

## 2024-02-07 NOTE — Patient Instructions (Signed)
 [facility] - A DEPT OF [facility]  Discharge Instructions: Thank you for choosing [facility] to provide your oncology and hematology care.   If you have a lab appointment with the Cancer Center, please go directly to the Cancer Center and check in at the registration area.   Wear comfortable clothing and clothing appropriate for easy access to any Portacath or PICC line.   We strive to give you quality time with your provider. You may need to reschedule your appointment if you arrive late (15 or more minutes).  Arriving late affects you and other patients whose appointments are after yours.  Also, if you miss three or more appointments without notifying the office, you may be dismissed from the clinic at the provider's discretion.      For prescription refill requests, have your pharmacy contact our office and allow 72 hours for refills to be completed.    Today you received the following chemotherapy and/or immunotherapy agents: Kadcyla .       To help prevent nausea and vomiting after your treatment, we encourage you to take your nausea medication as directed.  BELOW ARE SYMPTOMS THAT SHOULD BE REPORTED IMMEDIATELY: *FEVER GREATER THAN 100.4 F (38 C) OR HIGHER *CHILLS OR SWEATING *NAUSEA AND VOMITING THAT IS NOT CONTROLLED WITH YOUR NAUSEA MEDICATION *UNUSUAL SHORTNESS OF BREATH *UNUSUAL BRUISING OR BLEEDING *URINARY PROBLEMS (pain or burning when urinating, or frequent urination) *BOWEL PROBLEMS (unusual diarrhea, constipation, pain near the anus) TENDERNESS IN MOUTH AND THROAT WITH OR WITHOUT PRESENCE OF ULCERS (sore throat, sores in mouth, or a toothache) UNUSUAL RASH, SWELLING OR PAIN  UNUSUAL VAGINAL DISCHARGE OR ITCHING   Items with * indicate a potential emergency and should be followed up as soon as possible or go to the Emergency Department if any problems should occur.  Please show the CHEMOTHERAPY ALERT CARD or IMMUNOTHERAPY  ALERT CARD at check-in to the Emergency Department and triage nurse.  Should you have questions after your visit or need to cancel or reschedule your appointment, please contact [facility] - A DEPT OF [facility]  Dept: [phone]  and follow the prompts.  Office hours are 8:00 a.m. to 4:30 p.m. Monday - Friday. Please note that voicemails left after 4:00 p.m. may not be returned until the following business day.  We are closed weekends and major holidays. You have access to a nurse at all times for urgent questions. Please call the main number to the clinic Dept: [phone] and follow the prompts.   For any non-urgent questions, you may also contact your provider using MyChart. We now offer e-Visits for anyone 98 and older to request care online for non-urgent symptoms. For details visit [email or web address].   Also download the MyChart app! Go to the app store, search MyChart, open the app, select [place], and log in with your MyChart username and password.

## 2024-02-07 NOTE — Progress Notes (Signed)
 [facility] Cancer Follow up:    Donna Annabella SAILOR, FNP [address] [place] [address]   DIAGNOSIS: Cancer Staging  Malignant neoplasm of overlapping sites of right breast in female, estrogen receptor negative (HCC) Staging form: Breast, AJCC 8th Edition - Clinical stage from 01/01/2023: Stage IIA (cT2, cN0, cM0, G3, ER-, PR-, HER2+) - Signed by Odean Potts, MD on 01/02/2023 Stage prefix: Initial diagnosis Method of lymph node assessment: Clinical Histologic grading system: 3 grade system - Pathologic stage from 09/19/2023: ypT1c, ypN0(sn), cM0, G2, ER-, PR-, HER2+ - Signed by Lanell Donald Stagger, PA-C on 09/19/2023 Stage prefix: Post-therapy Response to neoadjuvant therapy: Partial response Method of lymph node assessment: Sentinel lymph node biopsy Histologic grading system: 3 grade system  Malignant neoplasm of upper-outer quadrant of left breast in female, estrogen receptor positive (HCC) Staging form: Breast, AJCC 8th Edition - Clinical: Stage IIB (cT2, cN1(f), cM0, G3, ER+, PR+, HER2-) - Signed by Lanell Donald Stagger, PA-C on 01/01/2023 Method of lymph node assessment: Core biopsy Histologic grading system: 3 grade system - Pathologic stage from 09/19/2023: ypT2, ypN1b, cM0, G2, ER+, PR+, HER2- - Signed by Lanell Donald Stagger, PA-C on 09/19/2023 Stage prefix: Post-therapy Response to neoadjuvant therapy: Partial response Method of lymph node assessment: Other Multigene prognostic tests performed: None Histologic grading system: 3 grade system    SUMMARY OF ONCOLOGIC HISTORY: Oncology History  Malignant neoplasm of overlapping sites of right breast in female, estrogen receptor negative (HCC)  01/01/2023 Cancer Staging   Staging form: Breast, AJCC 8th Edition - Clinical stage from 01/01/2023: Stage IIA (cT2, cN0, cM0, G3, ER-, PR-, HER2+) - Signed by Odean Potts, MD on 01/02/2023 Stage prefix: Initial diagnosis Method of lymph node  assessment: Clinical Histologic grading system: 3 grade system   01/25/2023 - 10/25/2023 Chemotherapy   Patient is on Treatment Plan : BREAST  Docetaxel  + Carboplatin  + Trastuzumab  + Pertuzumab   (TCHP) q21d       Genetic Testing   [facility] CancerNext+RNA was Negative. Report date is 01/16/2023.   The [facility] CancerNext+RNAinsight Panel includes sequencing, rearrangement analysis, and RNA analysis for the following 39 genes: APC, ATM, BAP1, BARD1, BMPR1A, BRCA1, BRCA2, BRIP1, CDH1, CDKN2A, CHEK2, FH, FLCN, MET, MLH1, MSH2, MSH6, MUTYH, NF1, NTHL1, PALB2, PMS2, PTEN, RAD51C, RAD51D, SMAD4, STK11, TP53, TSC1, TSC2, and VHL (sequencing and deletion/duplication); AXIN2, HOXB13, MBD4, MSH3, POLD1 and POLE (sequencing only); EPCAM and GREM1 (deletion/duplication only).    08/16/2023 Surgery   Bilateral mastectomy:  LEFT: 2.2 cm IDC, grade 2, margins negative, 3/6 SLN + macrometastases, 1 SLN micrometastases, ypT2, ypN2a, prog panel repeat ER 100%, PR 60%, Ki-67 10%, HER2 negative (1+).  RIGHT: ILC 1.8cm, margins negative, 4 SLN negative for cancer, prog panel repeat ER 0% negative, PR 0% negative, Ki-67 10%, HER2 (2+), FISH HER2 positive.     09/19/2023 Cancer Staging   Staging form: Breast, AJCC 8th Edition - Pathologic stage from 09/19/2023: ypT1c, ypN0(sn), cM0, G2, ER-, PR-, HER2+ - Signed by Lanell Donald Stagger, PA-C on 09/19/2023 Stage prefix: Post-therapy Response to neoadjuvant therapy: Partial response Method of lymph node assessment: Sentinel lymph node biopsy Histologic grading system: 3 grade system   10/16/2023 - 12/03/2023 Radiation Therapy   Adjuvant radiation   11/15/2023 -  Chemotherapy   Patient is on Treatment Plan : BREAST ADO-Trastuzumab Emtansine  (Kadcyla ) q21d     Malignant neoplasm of upper-outer quadrant of left breast in female, estrogen receptor positive (HCC)  01/01/2023 Cancer Staging   Staging form:  Breast, AJCC 8th Edition - Clinical: Stage IIB (cT2, cN1(f), cM0, G3, ER+,  PR+, HER2-) - Signed by Lanell Donald Stagger, PA-C on 01/01/2023 Method of lymph node assessment: Core biopsy Histologic grading system: 3 grade system   01/25/2023 - 10/25/2023 Chemotherapy   Patient is on Treatment Plan : BREAST  Docetaxel  + Carboplatin  + Trastuzumab  + Pertuzumab   (TCHP) q21d       Genetic Testing   [facility] CancerNext+RNA was Negative. Report date is 01/16/2023.   The [facility] CancerNext+RNAinsight Panel includes sequencing, rearrangement analysis, and RNA analysis for the following 39 genes: APC, ATM, BAP1, BARD1, BMPR1A, BRCA1, BRCA2, BRIP1, CDH1, CDKN2A, CHEK2, FH, FLCN, MET, MLH1, MSH2, MSH6, MUTYH, NF1, NTHL1, PALB2, PMS2, PTEN, RAD51C, RAD51D, SMAD4, STK11, TP53, TSC1, TSC2, and VHL (sequencing and deletion/duplication); AXIN2, HOXB13, MBD4, MSH3, POLD1 and POLE (sequencing only); EPCAM and GREM1 (deletion/duplication only).    08/16/2023 Surgery   Bilateral mastectomy:  LEFT: 2.2 cm IDC, grade 2, margins negative, 3/6 SLN + macrometastases, 1 SLN micrometastases, ypT2, ypN2a, prog panel repeat ER 100%, PR 60%, Ki-67 10%, HER2 negative (1+).  RIGHT: ILC 1.8cm, margins negative, 4 SLN negative for cancer, prog panel repeat ER 0% negative, PR 0% negative, Ki-67 10%, HER2 (2+), FISH HER2 positive.     09/19/2023 Cancer Staging   Staging form: Breast, AJCC 8th Edition - Pathologic stage from 09/19/2023: ypT2, ypN1b, cM0, G2, ER+, PR+, HER2- - Signed by Lanell Donald Stagger, PA-C on 09/19/2023 Stage prefix: Post-therapy Response to neoadjuvant therapy: Partial response Method of lymph node assessment: Other Multigene prognostic tests performed: None Histologic grading system: 3 grade system   10/16/2023 - 12/03/2023 Radiation Therapy   Adjuvant radiation   11/15/2023 -  Chemotherapy   Patient is on Treatment Plan : BREAST ADO-Trastuzumab Emtansine  (Kadcyla ) q21d       CURRENT THERAPY: Kadcyla   INTERVAL HISTORY:  Discussed the use of AI scribe software for clinical note  transcription with the patient, who gave verbal consent to proceed.  History of Present Illness Donna [name] is a 74 year old female with bilateral breast cancer undergoing Kadcyla  infusions who presents for pre-treatment evaluation.  She is on ongoing Kadcyla  for bilateral breast cancer. Echocardiogram on January 03, 2024 showed LVEF 55% with abnormal global longitudinal strain. She was referred to cardiology and will see Dr. Zenaida who she saw last week.  He started her on carvedilol  and she is going to continue Kadcyla  therapy.  She has persistent fatigue and weakness that she attributes to cardiac medication and cancer therapy. She also has insomnia and uses trazodone , sometimes staying awake all night. Fatigue and muscle pain limit daily activities and affect her treatment tolerance.  She has chronic muscle pain and spasms in the shoulders. She stretches frequently and notes occasional popping. She takes Tylenol  every 6 to 7 hours, occasionally two tablets, with partial relief. She has used Flexeril  in the past, but is out. Pain is noticeable but not severe.  She has allergic rhinitis with intermittent nasal congestion and a productive cough with clear sputum that she attributes to allergies. She uses an albuterol  inhaler, sometimes twice daily, and has not taken montelukast  in the past few days.     Patient Active Problem List   Diagnosis Date Noted   ABLA (acute blood loss anemia) 08/22/2023   S/P mastectomy, bilateral 08/16/2023   Bilateral breast cancer (HCC) 08/16/2023   Port-A-Cath in place 01/24/2023   Genetic testing 01/17/2023   Malignant neoplasm of overlapping sites of right breast in female,  estrogen receptor negative (HCC) 12/31/2022   Malignant neoplasm of upper-outer quadrant of left breast in female, estrogen receptor positive (HCC) 12/31/2022   Cough    Cigarette nicotine  dependence without complication    Stroke (HCC) 05/25/2020   Primary hypertension      has no known allergies.  MEDICAL HISTORY: Past Medical History:  Diagnosis Date   Allergy    Anxiety    Breast cancer (HCC)    Cataract    Depression    Hyperlipidemia    no meds    Hypertension    Stroke (HCC) 05/2020   mild weakness on left side of body    SURGICAL HISTORY: Past Surgical History:  Procedure Laterality Date   AXILLARY LYMPH NODE DISSECTION Left 08/16/2023   Procedure: REDGIE HARD;  Surgeon: Curvin Deward MOULD, MD;  Location: [facility] OR;  Service: General;  Laterality: Left;   BREAST BIOPSY Left 12/21/2022   US  LT BREAST BX W LOC DEV 1ST LESION IMG BX SPEC US  GUIDE 12/21/2022 [facility]   BREAST BIOPSY Left 12/21/2022   US  LT BREAST BX W LOC DEV EA ADD LESION IMG BX SPEC US  GUIDE 12/21/2022 [facility]   BREAST BIOPSY Right 12/26/2022   US  RT BREAST BX W LOC DEV 1ST LESION IMG BX SPEC US  GUIDE 12/26/2022 [facility]   BREAST BIOPSY Right 12/26/2022   US  RT BREAST BX W LOC DEV EA ADD LESION IMG BX SPEC US  GUIDE 12/26/2022 [facility]   BREAST BIOPSY Right 12/26/2022   MM RT BREAST BX W LOC DEV 1ST LESION IMAGE BX SPEC STEREO GUIDE 12/26/2022 [facility]   BREAST BIOPSY Left 08/15/2023   US  LT RADIOACTIVE SEED LOC 08/15/2023 [facility]   DENTAL SURGERY     INCISION AND DRAINAGE OF WOUND Right 08/21/2023   Procedure: IRRIGATION AND DEBRIDEMENT WOUND;  Surgeon: Curvin Deward MOULD, MD;  Location: [facility] OR;  Service: General;  Laterality: Right;  WOUND WASHOUT AND CLOSURE   IR IMAGING GUIDED PORT INSERTION  01/22/2023   MASTECTOMY W/ SENTINEL NODE BIOPSY Bilateral 08/16/2023   Procedure: MASTECTOMY WITH SENTINEL LYMPH NODE BIOPSY;  Surgeon: Curvin Deward MOULD, MD;  Location: [facility] OR;  Service: General;  Laterality: Bilateral;  GEN w/PEC BLOCK LEFT MASTY WITH SENTINEL NODE AND TARGETED NODE DISSECTION RIGHT MASTECTOMY WITH SENTINEL NODE   NSVD     x2   TUBAL LIGATION  1976    SOCIAL HISTORY: Social History   Socioeconomic History    Marital status: Divorced    Spouse name: Not on file   Number of children: Not on file   Years of education: Not on file   Highest education level: Not on file  Occupational History   Not on file  Tobacco Use   Smoking status: Every Day    Types: Cigarettes   Smokeless tobacco: Never   Tobacco comments:    8 cigs a day as of 7/2    10 cigs a day as of 01/14/24  Vaping Use   Vaping status: Never Used  Substance and Sexual Activity   Alcohol use: Yes    Comment: socially   Drug use: No   Sexual activity: Not Currently  Other Topics Concern   Not on file  Social History Narrative   Not on file   Social Drivers of Health   Tobacco Use: High Risk (02/07/2024)   Patient History    Smoking Tobacco Use: Every Day    Smokeless Tobacco Use: Never  Passive Exposure: Not on file  Financial Resource Strain: Not at Risk (04/04/2022)   Received from [employer]    Financial Resource Strain: 1  Food Insecurity: No Food Insecurity (01/17/2024)   Epic    Worried About Programme Researcher, Broadcasting/film/video in the Last Year: Never true    Ran Out of Food in the Last Year: Never true  Transportation Needs: No Transportation Needs (01/14/2024)   Epic    Lack of Transportation (Medical): No    Lack of Transportation (Non-Medical): No  Physical Activity: Not on File (05/04/2021)   Received from [facility]   Physical Activity    Physical Activity: 0  Stress: Not on File (05/04/2021)   Received from [facility]   Stress    Stress: 0  Social Connections: Moderately Integrated (08/16/2023)   Social Connection and Isolation Panel    Frequency of Communication with Friends and Family: More than three times a week    Frequency of Social Gatherings with Friends and Family: Three times a week    Attends Religious Services: 1 to 4 times per year    Active Member of Clubs or Organizations: Yes    Attends Banker Meetings: 1 to 4 times per year    Marital Status: Widowed  Intimate Partner  Violence: Not At Risk (08/16/2023)   Epic    Fear of Current or Ex-Partner: No    Emotionally Abused: No    Physically Abused: No    Sexually Abused: No  Depression (PHQ2-9): Low Risk (02/07/2024)   Depression (PHQ2-9)    PHQ-2 Score: 0  Recent Concern: Depression (PHQ2-9) - Medium Risk (01/14/2024)   Depression (PHQ2-9)    PHQ-2 Score: 9  Alcohol Screen: Not on file  Housing: Low Risk (08/16/2023)   Epic    Unable to Pay for Housing in the Last Year: No    Number of Times Moved in the Last Year: 0    Homeless in the Last Year: No  Utilities: Not At Risk (08/16/2023)   Epic    Threatened with loss of utilities: No  Health Literacy: Not on file    FAMILY HISTORY: Family History  Problem Relation Age of Onset   Colon cancer Mother 68 - 33   Cancer Father 32 - 46       unknown type   Colon polyps Sister    Esophageal cancer Sister 22 - 60   Lung cancer Sister 41 - 34   Ovarian cancer Sister 42   Colon cancer Brother 25   Prostate cancer Brother    Breast cancer Niece        dx. <50   Rectal cancer Neg Hx    Stomach cancer Neg Hx    Pancreatic cancer Neg Hx     Review of Systems  Constitutional:  Positive for fatigue. Negative for appetite change, chills, fever and unexpected weight change.  HENT:   Negative for hearing loss, lump/mass and trouble swallowing.   Eyes:  Negative for eye problems and icterus.  Respiratory:  Negative for chest tightness, cough and shortness of breath.   Cardiovascular:  Negative for chest pain, leg swelling and palpitations.  Gastrointestinal:  Negative for abdominal distention, abdominal pain, constipation, diarrhea, nausea and vomiting.  Endocrine: Negative for hot flashes.  Genitourinary:  Negative for difficulty urinating.   Musculoskeletal:  Negative for arthralgias.  Skin:  Negative for itching and rash.  Neurological:  Negative for dizziness, extremity weakness, headaches and numbness.  Hematological:  Negative for adenopathy. Does not  bruise/bleed easily.  Psychiatric/Behavioral:  Negative for depression. The patient is not nervous/anxious.       PHYSICAL EXAMINATION   Onc Performance Status - 02/07/24 1149       ECOG Perf Status   ECOG Perf Status Restricted in physically strenuous activity but ambulatory and able to carry out work of a light or sedentary nature, e.g., light house work, office work      KPS SCALE   KPS % SCORE Able to carry on normal activity, minor s/s of disease          Vitals:   02/07/24 1148 02/07/24 1149  BP: (!) 164/74 (!) 158/75  Pulse: 87   Resp: 16   Temp: (!) 97.3 F (36.3 C)   SpO2: 98%     Physical Exam Constitutional:      General: She is not in acute distress.    Appearance: Normal appearance. She is not toxic-appearing.  HENT:     Head: Normocephalic and atraumatic.     Mouth/Throat:     Mouth: Mucous membranes are moist.     Pharynx: Oropharynx is clear. No oropharyngeal exudate or posterior oropharyngeal erythema.  Eyes:     General: No scleral icterus. Cardiovascular:     Rate and Rhythm: Normal rate and regular rhythm.     Pulses: Normal pulses.     Heart sounds: Normal heart sounds.  Pulmonary:     Effort: Pulmonary effort is normal.     Breath sounds: Normal breath sounds.  Abdominal:     General: Abdomen is flat. Bowel sounds are normal. There is no distension.     Palpations: Abdomen is soft.     Tenderness: There is no abdominal tenderness.  Musculoskeletal:        General: No swelling.     Cervical back: Neck supple.  Lymphadenopathy:     Cervical: No cervical adenopathy.  Skin:    General: Skin is warm and dry.     Findings: No rash.  Neurological:     General: No focal deficit present.     Mental Status: She is alert.  Psychiatric:        Mood and Affect: Mood normal.        Behavior: Behavior normal.     LABORATORY DATA:  CBC    Component Value Date/Time   WBC 8.4 02/07/2024 1109   WBC 13.5 (H) 08/23/2023 0416   RBC 4.18  02/07/2024 1109   HGB 12.7 02/07/2024 1109   HCT 38.3 02/07/2024 1109   PLT 221 02/07/2024 1109   MCV 91.6 02/07/2024 1109   MCH 30.4 02/07/2024 1109   MCHC 33.2 02/07/2024 1109   RDW 14.4 02/07/2024 1109   LYMPHSABS 2.3 02/07/2024 1109   MONOABS 0.5 02/07/2024 1109   EOSABS 0.1 02/07/2024 1109   BASOSABS 0.0 02/07/2024 1109    CMP     Component Value Date/Time   NA 140 02/07/2024 1109   K 3.8 02/07/2024 1109   CL 107 02/07/2024 1109   CO2 23 02/07/2024 1109   GLUCOSE 101 (H) 02/07/2024 1109   BUN 8 02/07/2024 1109   CREATININE 0.81 02/07/2024 1109   CALCIUM  9.8 02/07/2024 1109   PROT 6.9 02/07/2024 1109   ALBUMIN  4.0 02/07/2024 1109   AST 31 02/07/2024 1109   ALT 18 02/07/2024 1109   ALKPHOS 95 02/07/2024 1109   BILITOT 0.4 02/07/2024 1109   GFRNONAA >60 02/07/2024 1109  ASSESSMENT and THERAPY PLAN:    Assessment and Plan Assessment & Plan Bilateral breast cancer on active treatment Receiving Kadcyla  with cardiology clearance despite abnormal global longitudinal strain. Reports fatigue, insomnia, and myalgia likely due to cardiac medication, cancer therapy, and prior radiation. Cardiac and lab parameters acceptable for continued therapy. - Continued Kadcyla  as cleared by cardiology. - Coordinated future Kadcyla  infusions, next on February 13th. - Advised monitoring for persistent or worsening fatigue, notify team if symptoms worsen. - Instructed to seek emergent care if acutely unwell. - Planned reassessment of symptoms and treatment tolerance at next visit.  Muscle spasm of shoulder region Ongoing myalgia and muscle spasms in shoulder, noticeable but not severe. Tylenol  provides partial relief. Concern for sedation and fatigue with muscle relaxants due to cancer therapy and cardiac medication. - Prescribed Flexeril  with cautious use due to sedation risk. - Advised prioritization of Tylenol , limit to four tablets in 24 hours. - Instructed Flexeril  use only if  Tylenol  insufficient. - Reviewed safe Tylenol  dosing intervals, one dose every six to seven hours, not exceeding four doses in 24 hours.  RTC in 3 weeks for labs, f/u, and her next treatment  All questions were answered. The patient knows to call the clinic with any problems, questions or concerns. We can certainly see the patient much sooner if necessary.  Total encounter time:20 minutes*in face-to-face visit time, chart review, lab review, care coordination, order entry, and documentation of the encounter time.    Morna Kendall, NP 02/07/24 4:13 PM Medical Oncology and Hematology [facility] [address] [place], [place] [address] Tel. [phone]    Fax. [phone]  *Total Encounter Time as defined by the Centers for Medicare and Medicaid Services includes, in addition to the face-to-face time of a patient visit (documented in the note above) non-face-to-face time: obtaining and reviewing outside history, ordering and reviewing medications, tests or procedures, care coordination (communications with other health care professionals or caregivers) and documentation in the medical record.

## 2024-02-10 ENCOUNTER — Other Ambulatory Visit: Payer: Self-pay | Admitting: *Deleted

## 2024-02-10 ENCOUNTER — Encounter: Payer: Self-pay | Admitting: *Deleted

## 2024-02-10 NOTE — Patient Outreach (Signed)
 Aging [facility]fully Program  02/10/2024  Sayana Salley Mar 19, 1950 [id number]   Telephone call made to Ms. Loney to reschedule Wednesday's visit due to the icy road conditions. Ms. Gaw agreeable to [employer] RN home visit for February 5th at 2pm.     Pablo Hurst, MSN, RN, BSN [place]  [facility], [facility] RN Case Manager for Aging Gracefully Direct Dial: [phone]

## 2024-02-11 ENCOUNTER — Encounter: Payer: Self-pay | Admitting: Hematology and Oncology

## 2024-02-12 ENCOUNTER — Encounter: Admitting: *Deleted

## 2024-02-14 ENCOUNTER — Other Ambulatory Visit: Payer: Self-pay | Admitting: Rehabilitation

## 2024-02-14 NOTE — Patient Outreach (Signed)
 Aging [facility]fully Program  OT Follow-Up Visit  02/14/2024  Donna [name] [birth year] [id number]  Visit:  2- Second Visit  Start Time:  1400 End Time:  1435 Total Minutes:  35  Readiness to Change Score :  Readiness to Change Score: 10   Patient Education: Education Provided: Yes Education Details: How to get up from a fall handout given and reviewed. Person(s) Educated: Patient Comprehension: Verbalized Understanding  Goals:   Goals Addressed             This Visit's Progress    AG OT Patient Stated   On track    Improve safety and mobility in both bathrooms 02/14/24 Discussion about needs in bathroom. OT will order a LH sponge in addition to a reacher to assist with safer ADLs     AG OT Patient Stated       Improve safety and mobility in and out of the house. 02/14/24 Reviewed how to get up from a fall        Post Clinical Reasoning: Client Action (Goal) Two Interventions: 02/14/24 discussed needs in bathroom. Will continue to use current toilet seat and handles that fit over the toilet. Discussed potential modifications Did Client Try?: No  Reviewed goals. Confirmed adaptive equipment for OT to order to assist with ADLs

## 2024-02-18 ENCOUNTER — Other Ambulatory Visit: Payer: Self-pay

## 2024-02-19 ENCOUNTER — Other Ambulatory Visit: Payer: Self-pay | Admitting: *Deleted

## 2024-02-19 NOTE — Patient Outreach (Signed)
 Aging [facility]fully Program  02/19/2024  Donna [name] 05/07/1950 [id number]   Telephone call made to [name] to confirm tomorrow's AG RN home visit. [name] states she thought AG OT visit was scheduled for tomorrow. Writer assured Ms. Blunt [employer] RN home visit is for tomorrow not AG OT. Discussed writer will contact her tomorrow after potential weather this evening to confirm again.     Pablo Hurst, MSN, RN, BSN [place]  [facility], [facility] RN Case Manager for Aging Gracefully Direct Dial: [phone]

## 2024-02-20 ENCOUNTER — Encounter: Payer: Self-pay | Admitting: *Deleted

## 2024-02-20 ENCOUNTER — Other Ambulatory Visit: Payer: Self-pay | Admitting: *Deleted

## 2024-02-20 NOTE — Patient Outreach (Signed)
 Aging Gracefully Program  02/20/2024  Donna [name] Jun 09, 1950 [id number]   Telephone call made to Donna [name] to reschedule Aging Gracefully RN home visit due to the weather. Donna [name] agreeable to rescheduling AG RN home visit on Feb. 18th at 2 pm.    Pablo Hurst, MSN, RN, BSN [place]  [facility], [facility] RN Case Manager for Aging Gracefully Direct Dial: [phone]

## 2024-02-25 ENCOUNTER — Ambulatory Visit (HOSPITAL_COMMUNITY)

## 2024-02-28 ENCOUNTER — Inpatient Hospital Stay

## 2024-02-28 ENCOUNTER — Inpatient Hospital Stay: Attending: Hematology and Oncology

## 2024-02-28 ENCOUNTER — Inpatient Hospital Stay: Admitting: Adult Health

## 2024-03-04 ENCOUNTER — Encounter: Payer: Self-pay | Admitting: *Deleted

## 2024-03-31 ENCOUNTER — Other Ambulatory Visit (HOSPITAL_COMMUNITY)

## 2024-03-31 ENCOUNTER — Ambulatory Visit (HOSPITAL_COMMUNITY)
